# Patient Record
Sex: Male | Born: 1945 | Race: White | Hispanic: No | Marital: Married | Smoking: Never smoker
Health system: Southern US, Community
[De-identification: ages and names within clinical notes are randomized; demographics above are authoritative.]

## PROBLEM LIST (undated history)

## (undated) DIAGNOSIS — M1990 Unspecified osteoarthritis, unspecified site: Secondary | ICD-10-CM

## (undated) DIAGNOSIS — K219 Gastro-esophageal reflux disease without esophagitis: Secondary | ICD-10-CM

## (undated) DIAGNOSIS — I10 Essential (primary) hypertension: Secondary | ICD-10-CM

## (undated) DIAGNOSIS — F32A Depression, unspecified: Secondary | ICD-10-CM

## (undated) DIAGNOSIS — Z8042 Family history of malignant neoplasm of prostate: Secondary | ICD-10-CM

## (undated) DIAGNOSIS — G20 Parkinson's disease: Secondary | ICD-10-CM

## (undated) DIAGNOSIS — E785 Hyperlipidemia, unspecified: Secondary | ICD-10-CM

## (undated) DIAGNOSIS — Z808 Family history of malignant neoplasm of other organs or systems: Secondary | ICD-10-CM

## (undated) DIAGNOSIS — G20A1 Parkinson's disease without dyskinesia, without mention of fluctuations: Secondary | ICD-10-CM

## (undated) DIAGNOSIS — C61 Malignant neoplasm of prostate: Secondary | ICD-10-CM

## (undated) HISTORY — DX: Unspecified osteoarthritis, unspecified site: M19.90

## (undated) HISTORY — DX: Family history of malignant neoplasm of other organs or systems: Z80.8

## (undated) HISTORY — DX: Gastro-esophageal reflux disease without esophagitis: K21.9

## (undated) HISTORY — DX: Hyperlipidemia, unspecified: E78.5

## (undated) HISTORY — DX: Parkinson's disease: G20

## (undated) HISTORY — DX: Family history of malignant neoplasm of prostate: Z80.42

## (undated) HISTORY — DX: Essential (primary) hypertension: I10

## (undated) HISTORY — DX: Parkinson's disease without dyskinesia, without mention of fluctuations: G20.A1

## (undated) HISTORY — DX: Depression, unspecified: F32.A

---

## 2017-07-01 DIAGNOSIS — M720 Palmar fascial fibromatosis [Dupuytren]: Secondary | ICD-10-CM | POA: Insufficient documentation

## 2021-09-14 ENCOUNTER — Ambulatory Visit (INDEPENDENT_AMBULATORY_CARE_PROVIDER_SITE_OTHER): Payer: Medicare Other | Admitting: Neurology

## 2021-09-14 ENCOUNTER — Encounter: Payer: Self-pay | Admitting: Neurology

## 2021-09-14 VITALS — BP 135/78 | HR 57 | Ht 68.0 in | Wt 172.8 lb

## 2021-09-14 DIAGNOSIS — G250 Essential tremor: Secondary | ICD-10-CM

## 2021-09-14 DIAGNOSIS — G20 Parkinson's disease: Secondary | ICD-10-CM

## 2021-09-14 DIAGNOSIS — Z820 Family history of epilepsy and other diseases of the nervous system: Secondary | ICD-10-CM

## 2021-09-14 NOTE — Patient Instructions (Addendum)
It was nice to meet you and Frederick Ponce today.  As discussed, for now, we will keep you on your current medications which is carbidopa-levodopa 25-100 mg strength 1-1/2 pills once daily as well as propranolol 40 mg once daily.  I will request additional records from Dr. Patton Salles.  As you had multiple tests done through her office. We will check your kidney and liver function. Please work on reducing your daily alcohol consumption by limiting yourself to 1 serving per day. As discussed, we will proceed with a so-called DaT scan: This is a specialized brain scan designed to help with diagnosis of tremor disorders. A radioactive marker gets injected and the uptake is measured in the brain and compared to normal controls and right side is compared to the left, a change in uptake can help with diagnosis of certain tremor disorders. A brain MRI on the other hand is a brain scan that helps look at the brain structure in more detail overall and look for age-related changes, blood vessel related changes and look for stroke and volume loss which we call atrophy.

## 2021-09-14 NOTE — Progress Notes (Addendum)
Subjective:    Patient ID: Randeep Biondolillo is a 75 y.o. male.  HPI    Star Age, MD, PhD [facility] [address], [address] [place], [place] [address]  Dear Dr. Patton Salles,    I saw your patient, Kadarrius Yanke, upon your kind request, in my Neurologic clinic today for initial consultation of his tremor disorder, concern for  and history of neuropathy.  The patient is accompanied by his spouse, [name], today.  As you know, Mr. Jeancharles is a 76 year old right-handed gentleman with an underlying medical history of Hyperlipidemia and overweight state, who reports a longstanding history of bilateral upper extremity tremors.  He reports a family history of tremors affecting his father, paternal grandmother, 2 sisters.  One paternal aunt had Parkinson's disease and was on levodopa therapy.  He is currently on propranolol 40 mg at 1 AM at night.  He also takes carbidopa-levodopa 25-100 mg strength 1-1/2 pills at 1 AM at night, he does not take it during the day.  He reports that it is helping, both medications have helped his tremor and his motor function.  He has not had any recent falls.  He does not take his medications during the day as he would forget them.  He has not been on a higher dose of levodopa therapy.  He has not had any significant side effects.  He has an appointment with a new primary care physician in the next couple of weeks. I reviewed your office note from 05/11/2019.  He was complaining of pain in his legs and feet at the time.  He was also complaining of back pain he reported a tremor in his right hand which was longstanding for over 10 years.  He also reported a family history of tremors at the time.  He was advised to proceed with additional work-up through your office including a lumbar spine MRI without contrast, ultrasound for ankle brachial indices, EMG and nerve conduction velocity testing of both lower extremities.  He was advised to proceed  with blood work including B6, CBC with differential, vitamin D, A1c, vitamin a and E, protein electrophoresis, CMP, methylmalonic acid and homocystine.  Test results are not available for my review today.  He had a follow-up appointment on 11/02/2020 and your office.  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  He had a subsequent appointment on 01/25/2021, at which time he reported that he discontinued gabapentin 300 mg 3 times a day.  He felt very fatigued in the mornings.  He was started on Sinemet 25-100 mg strength 1 pill 3 times daily at the time.  He had an appointment on 06/19/2021, at which time he was on Sinemet 1 pill 3 times a day, 25-100 mg strength.  He had apparently tried gabapentin 100 mg at bedtime but it was no longer on his active list of medications.  He was referred to physical therapy.  He was started on propranolol 40 mg once daily to help with his tremors. We will request test results from your office.  They moved from [place], [place], some 2 weeks ago.  He has relatives close to here.  They have 3 dogs in the household.  His sleep is disrupted.  He tries to hydrate well.  He drinks caffeine in the form of coffee, 1 cup in the morning.  He drinks alcohol daily in the form of 3 diluted glasses of wine on average.   He reports intermittent numbness in his  feet, does not indicate that he has been told to reduce his alcohol intake or a connection between his alcohol consumption and neuropathy symptoms.  He recalls that he could not tolerate gabapentin.  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  A small fiber neuropathy cannot be ruled out on the basis of the study. Blood work from 05/15/2019 showed methylmalonic acid 121, homocystine 12.3, glucose 110, BUN 16, creatinine 0.85, alk phos  68, AST 21, ALT 23, A1c 5.6, protein electrophoresis benign, CBC with differential and platelets benign, vitamin D 17.6, within range, vitamin B6 18.9, within range, vitamin D 64, within range, vitamin D 46.    MRI brain without contrast from 05/22/2019 showed: Impression: No acute intracranial process.  White matter changes are nonspecific, but most likely related to chronic small vessel ischemic changes given the patient's age.  Generalized cerebral volume loss.  Polyp/mucosal retention cysts within the bilateral maxillary sinuses.  Scattered mucosal thickening of the paranasal sinuses.  He had a lumbar spine MRI without contrast on 05/22/2019 and I reviewed the results: Impression: Patient motion artifact slightly limits evaluation.  Multilevel degenerative changes and mild epidural lipomatosis, worst at L4-L5, where there is mild right and mild to moderate left neuroforaminal narrowing, minimal spinal canal stenosis, lateral recess narrowing, suspected abutment of the bilateral exiting L4 nerve roots and possible abutment of the descending left L5 nerve root.  Please see above for additional details regarding the individual levels.  Bone marrow edema within the right aspect of the T12-L1 endplate/right aspect of the L1 vertebral body is likely on a degenerative basis/reactive, however, on noncontrasted CT of the lumbar spine may be beneficial to better exclude an underlying lesion within the L1.  Bone marrow is heterogeneous diffusely, nonspecific but likely relating to age-related changes, degenerative changes and/or underlying osteopenia.  Prominence of the right renal pelvis, possibly relating to peri-/parapelvic cysts but incompletely evaluated on this study.  Hydronephrosis is not entirely excluded in the appropriate clinical setting.  Hypointense ovoid focus in this region measuring 1.1 cm, possibly relating to her renal calculus.  Clinical correlation is recommended.  Dedicated imaging is recommended  for further evaluation as clinically indicated.  His Past Medical History Is Significant For: History reviewed. No pertinent past medical history.  His Past Surgical History Is Significant For: History reviewed. No pertinent surgical history.  His Family History Is Significant For: Family History  Problem Relation Age of Onset   Tremor Father     His Social History Is Significant For: Social History   Socioeconomic History   Marital status: Married    Spouse name: Not on file   Number of children: Not on file   Years of education: Not on file   Highest education level: Not on file  Occupational History   Not on file  Tobacco Use   Smoking status: Never   Smokeless tobacco: Never  Vaping Use   Vaping Use: Never used  Substance and Sexual Activity   Alcohol use: Yes    Alcohol/week: 3.0 standard drinks    Types: 3 Glasses of wine per week   Drug use: Never   Sexual activity: Not on file  Other Topics Concern   Not on file  Social History Narrative   Not on file   Social Determinants of Health   Financial  Resource Strain: Not on file  Food Insecurity: Not on file  Transportation Needs: Not on file  Physical Activity: Not on file  Stress: Not on file  Social Connections: Not on file    His Allergies Are:  No Known Allergies:   His Current Medications Are:  Outpatient Encounter Medications as of 09/14/2021  Medication Sig   atorvastatin (LIPITOR) 40 MG tablet Take 40 mg by mouth daily.   LEVODOPA PO Take by mouth.   propranolol (INDERAL) 40 MG tablet Take 40 mg by mouth daily.   No facility-administered encounter medications on file as of 09/14/2021.  :   Review of Systems:  Out of a complete 14 point review of systems, all are reviewed and negative with the exception of these symptoms as listed below:   Review of Systems  Neurological:        Pt is here for Tremors . Pt states his hands and arms shows the most tremors. Pt states at times his legs burn.  Pt states he has to sleep with  a pillow between his legs because if his legs touch they will get achey.   Objective:  Neurological Exam  Physical Exam Physical Examination:   Vitals:   09/14/21 0808  BP: 135/78  Pulse: (!) 57   General Examination: The patient is a very pleasant 75 y.o. male in no acute distress. He appears well-developed and well-nourished and well groomed.   HEENT: Normocephalic, atraumatic, pupils are equal, round and reactive to light, extraocular tracking is fairly well-preserved.  Face is symmetric with no significant facial masking or decrease in eye blink rate.  Neck is fairly supple, no significant nuchal rigidity noted.  Hearing is grossly intact.  He has no lip, neck or jaw tremor.  Airway examination reveals mild mouth dryness.  Tongue protrudes centrally and palate elevates symmetrically.    Chest: Clear to auscultation without wheezing, rhonchi or crackles noted.  Heart: S1+S2+0, regular and normal without murmurs, rubs or gallops noted.   Abdomen: Soft, non-tender and non-distended with normal bowel sounds appreciated on auscultation.  Extremities: There is no pitting edema in the distal lower extremities bilaterally.  Skin: Warm and dry without trophic changes noted.  Musculoskeletal: exam reveals no obvious joint deformities, tenderness or joint swelling or erythema.   Neurologically:  Mental status: The patient is awake, alert and oriented in all 4 spheres. His immediate and remote memory, attention, language skills and fund of knowledge are impaired.  He has trouble relating details of his history and chronological events.  His spouse provides some additional information. Thought process is linear. Mood is normal and affect is normal.  Cranial nerves II - XII are as described above under HEENT exam. In addition: shoulder shrug is normal with equal shoulder height noted. Motor exam: Thin bulk, global strength of 4+ out of 5, no focal weakness, no  focal atrophy, has bilateral contractures of the fifth digits and hands.  Tone without significant cogwheeling.  He has an intermittent resting tremor in the left more than right upper extremities.   On 09/14/2021: On Archimedes spiral drawing he has no significant trembling with the right hand, insecurity and intermittent trembling with the left hand, handwriting is legible, not particularly tremulous or significantly micrographic, on the smaller side.  He has a mild postural and action tremor in both upper extremities.  No drift.  Reflexes are 1+ in the upper extremities and trace in the knees, absent in the ankles, Romberg is not tested for  safety concerns.   Fine motor skills and coordination: He has mild difficulty with finger taps and hand movements and foot taps bilaterally, left slightly worse than right.   Cerebellar testing: No dysmetria or intention tremor on finger to nose testing. Heel to shin is unremarkable bilaterally. There is no truncal or gait ataxia.  Sensory exam: intact to light touch, vibration and temperature sense in the upper and lower extremities with the exception of decreased temperature sense in the left and right distal lower extremities.   Gait, station and balance: He stands without difficulty, he has no walking aid, posture is age-appropriate.  He walks slightly slowly and cautiously, he has a slight decrease in arm swing on the left.  He has a more pronounced tremor in both upper extremities while walking.  Assessment and Plan:   In summary, Sebasthian Stailey is a very pleasant 75 y.o.-year old male with an underlying medical history of hyperlipidemia and overweight state, who presents for evaluation of his tremor disorder.  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  He also has a strong family history of tremors in general.  He also had work-up in the past for neuropathy, no etiology was found and findings currently are on the milder side.  He takes his propranolol and levodopa in the middle of the night which is unusual, also unusual is the lower dose and once daily dosing of immediate release levodopa.  Nevertheless, he has benefited from treatment with both medications and I did not suggest any significant change today.  We talked about his risk for neuropathy including daily alcohol consumption.  He does not drink heavily but has been drinking on a regular/daily basis.  He is discouraged from drinking alcohol daily and is advised to try to scale back especially if we were to escalate his levodopa dosing in the near future.  He has a normal heart rate with a pulse rate of 57 today.  I would not recommend increasing his beta-blocker at this time, he requested refills on his propranolol and levodopa today which I will provide.  I would like to proceed with a DaTscan for better diagnostic help.  I explained the scan to the patient.  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  The patient is agreeable to pursuing a DaTscan.  He is advised that this is not a definitive or diagnostic test per se.  He is advised to continue to pursue a healthy lifestyle, good nutrition, good hydration with water and physical activity.  We will plan a follow-up in about 3 to 4 months, sooner if needed.  We will keep him posted as to his DaTscan results by phone call in the interim.   This was an extended visit with copious record review, and higher complexity, with discussion of essential tremor versus parkinsonism.  Thank you very much for allowing me to participate in the care of this nice patient. If I can be of any further assistance to  you please do not hesitate to call me at [phone].  Sincerely,   Star Age, MD, PhD

## 2021-09-15 LAB — COMPREHENSIVE METABOLIC PANEL
ALT: 11 IU/L (ref 0–44)
AST: 19 IU/L (ref 0–40)
Albumin/Globulin Ratio: 2 (ref 1.2–2.2)
Albumin: 4.2 g/dL (ref 3.7–4.7)
Alkaline Phosphatase: 99 IU/L (ref 44–121)
BUN/Creatinine Ratio: 21 (ref 10–24)
BUN: 18 mg/dL (ref 8–27)
Bilirubin Total: 0.7 mg/dL (ref 0.0–1.2)
CO2: 23 mmol/L (ref 20–29)
Calcium: 9.1 mg/dL (ref 8.6–10.2)
Chloride: 104 mmol/L (ref 96–106)
Creatinine, Ser: 0.86 mg/dL (ref 0.76–1.27)
Globulin, Total: 2.1 g/dL (ref 1.5–4.5)
Glucose: 100 mg/dL — ABNORMAL HIGH (ref 65–99)
Potassium: 4.7 mmol/L (ref 3.5–5.2)
Sodium: 140 mmol/L (ref 134–144)
Total Protein: 6.3 g/dL (ref 6.0–8.5)
eGFR: 90 mL/min/{1.73_m2} (ref >59)

## 2021-09-18 ENCOUNTER — Telehealth: Payer: Self-pay | Admitting: Neurology

## 2021-09-18 MED ORDER — PROPRANOLOL HCL 40 MG PO TABS: 40.0000 mg | ORAL_TABLET | Freq: Every day | ORAL | 3 refills | Status: DC

## 2021-09-18 MED ORDER — CARBIDOPA-LEVODOPA 25-100 MG PO TABS: 1.5000 | ORAL_TABLET | Freq: Every day | ORAL | 3 refills | Status: DC

## 2021-09-18 NOTE — Telephone Encounter (Signed)
Pt states since Friday he has been still waiting for his medications:propranolol (INDERAL) 40 MG tablet, atorvastatin (LIPITOR) 40 MG tablet, LEVODOPA PO to be called into [facility] [phone] , please call pt re: refill status and the results to lab work.

## 2021-09-18 NOTE — Addendum Note (Signed)
Addended by: Brandon Melnick on: 09/18/2021 02:19 PM   Modules accepted: Orders

## 2021-09-18 NOTE — Telephone Encounter (Signed)
I called pt LMVM for him that sinemet and propranolol was escribed to [facility] and atorvastatin I dont see that she stated in her note that she would prescribe, that may be for your pcp, would be glad to ask she is out of office today.  Lab results unremarkable.  He is to call back as needed.

## 2021-09-19 NOTE — Telephone Encounter (Signed)
Called pt and LVM asking for call back. Also sent mychart message.   When the patient calls back, please let him know the prescriptions were sent to [facility] for Propranolol and Sinemet however per Dr Rexene Alberts, the Atorvastatin is to be prescribed by his primary care doctor.

## 2021-09-19 NOTE — Telephone Encounter (Signed)
I prescribed the propranolol and levodopa at the time of his appointment.  His Lipitor is to be prescribed by his primary care physician, I clarified this during the appointment with the patient and his partner.

## 2021-09-21 ENCOUNTER — Telehealth: Payer: Self-pay | Admitting: Neurology

## 2021-09-21 NOTE — Telephone Encounter (Signed)
Order placed for DaTscan. Referral form filled out & given to MD to sign.

## 2021-10-04 NOTE — Telephone Encounter (Signed)
No Josem Kaufmann is required for DaTscan per insurance. Orders sent to [facility] for scheduling.

## 2021-10-10 ENCOUNTER — Telehealth: Payer: Self-pay | Admitting: Neurology

## 2021-10-10 NOTE — Telephone Encounter (Signed)
Pt called states his neuropathy has become more intense. Describes a burning sensation which has increased. Pt requesting a call back regarding what he should do.

## 2021-10-10 NOTE — Telephone Encounter (Signed)
Pt returned call. He was seen for PD and received recommendations concerning this, DATSCAN to be done, pt needs to schedule, will give him [phone] speak to DAVID at [facility] for this (I will relay has he stated he had not heard anything yet).  He called for burning pain (like freezing pain) in bilateral legs when he lays down , usually an hour later will wake him up, he takes motrin 400mg  prn which calms things for him.  Was not sure if ok to take with other meds he is on.   Looking at last note you did not really address this.  He is not sure why this started now.  I explained that you address only one issue, and that was tremors but would be glad to ask.  He appreciated call.

## 2021-10-10 NOTE — Telephone Encounter (Signed)
I would recommend that he make a follow-up appointment with his primary care physician first to discuss pain in the legs. It could be coming from the back.  He may benefit from seeing a spine specialist as well.  I recommend that he see his primary care physician first to discuss a potential referral to orthopedics.

## 2021-10-10 NOTE — Telephone Encounter (Signed)
I called and LMVM for pt, Dr. Guadelupe Sabin message concerning pain in legs.  Recommended seeing pcp for eval and referral to orhtopedics.  He is to call back if questions.

## 2021-10-10 NOTE — Telephone Encounter (Signed)
LMVM for pt that returned call.  °

## 2021-10-11 ENCOUNTER — Telehealth: Payer: Self-pay | Admitting: Neurology

## 2021-10-11 NOTE — Telephone Encounter (Signed)
I called pt and relayed by VM that im sorry I did not relay to him about what I found out about DATSCAN.  I called [phone] spoke to Lalla Brothers not available.  Doing Datscan now at [facility], I relayed that per shannon did not have his information.  Will resend again and they should call him. No auth needed per Casimer Bilis in our office, noted 10-04-21. Pt to call back if questions.  I did leave # for pt to call later.

## 2021-10-11 NOTE — Telephone Encounter (Signed)
Pt is asking for a call to discuss the testing for tremors, please call.

## 2021-10-24 ENCOUNTER — Encounter (HOSPITAL_COMMUNITY)
Admission: RE | Admit: 2021-10-24 | Discharge: 2021-10-24 | Disposition: A | Discharge status: Home / Self Care | Payer: Medicare Other | Source: Ambulatory Visit | Attending: Neurology | Admitting: Neurology

## 2021-10-24 ENCOUNTER — Other Ambulatory Visit: Payer: Self-pay

## 2021-10-24 DIAGNOSIS — Z820 Family history of epilepsy and other diseases of the nervous system: Secondary | ICD-10-CM | POA: Insufficient documentation

## 2021-10-24 DIAGNOSIS — G250 Essential tremor: Secondary | ICD-10-CM | POA: Diagnosis not present

## 2021-10-24 DIAGNOSIS — G20 Parkinson's disease: Secondary | ICD-10-CM | POA: Insufficient documentation

## 2021-10-24 MED ORDER — IOFLUPANE I 123 185 MBQ/2.5ML IV SOLN
4.4000 | Freq: Once | INTRAVENOUS | Status: AC | PRN
  Administered 2021-10-24: 4.4 via INTRAVENOUS
  Filled 2021-10-24: qty 5

## 2021-10-24 MED ORDER — POTASSIUM IODIDE (ANTIDOTE) 130 MG PO TABS: 130.0000 mg | ORAL_TABLET | Freq: Once | ORAL | Status: DC

## 2021-10-24 MED ORDER — POTASSIUM IODIDE (ANTIDOTE) 130 MG PO TABS
ORAL_TABLET | ORAL | Status: AC
  Filled 2021-10-24: qty 1

## 2021-10-26 ENCOUNTER — Telehealth: Payer: Self-pay | Admitting: *Deleted

## 2021-10-26 NOTE — Telephone Encounter (Signed)
-----   Message from Star Age, MD sent at 10/25/2021 11:09 AM EDT ----- These call patient regarding the recent nuclear medicine DaT scan result. As discussed, this is a specialized brain scan designed to help with diagnosis of tremor disorders, including parkinsonian disorders. A radioactive marker gets injected and the uptake is measured in the brain and compared to normal controls and right side is compared to the left. A change in uptake can help with diagnosis of certain tremor disorders and narrow down the diagnostic possibilities. The patient's recent scan indicated abnormal (as in lower) uptake as compared to normal uptake pattern, indicating an underlying parkinsonian disorder.  Again, this is not a definitive test for Parkinson's disease and does not distinguish between Parkinson's disease and other, atypical parkinsonism disorders. We will continue current medications and he can follow-up as scheduled.

## 2021-10-26 NOTE — Telephone Encounter (Signed)
LMVM for pt to return call for [facility] results.

## 2021-10-26 NOTE — Telephone Encounter (Signed)
The patient returned my call.  We discussed the results of the DaTscan.  He verbalized understanding of the results and also that per Dr. Rexene Alberts we will continue current medications and he can follow-up as scheduled next month on 12/14/2021.  The patient verbalized appreciation for the call and he also asked if the results could be sent over to his partner's email: [email or web address]. If not, he would like it mailed to him.

## 2021-11-27 ENCOUNTER — Telehealth: Payer: Self-pay | Admitting: Neurology

## 2021-11-27 NOTE — Telephone Encounter (Signed)
Pt called states his tremors have increased, requesting a call back.

## 2021-11-27 NOTE — Telephone Encounter (Signed)
Patient returned our call (he has been rescheduled). He stated that 3 weeks ago he saw PCP for his sciatica. He states he was placed on Prednisone and Meloxicam (had to stop due to contraindication with Prednisone) and is now on Tylenol as well instead of Meloxicam. He states in the last week he has noticed an increase in frequency and severity of his tremors. He wanted this to be relayed to provider as an FYI but also if there is anything he should know or any recommendations please call back. I did relay to him that an increase of stress on the body could temporarily worsen the tremors. He stated he is still taking his Sinemet and Propranolol. Tomorrow is the last day of his Prednisone.   I do not see any drug-drug flags on Micromedex regarding his Prednisone, Sinemet, Propranolol.

## 2021-11-27 NOTE — Telephone Encounter (Signed)
I called pt and LMVM for him to return call with more symptoms.  He has appt 12-14-21 which looks like it is flagged to change (change in Dr. Guadelupe Sabin schedule).  If he calls back make an appointment for him.  Then I will speak to him about increased tremors.

## 2021-11-29 NOTE — Telephone Encounter (Signed)
Noted, thank you.  No additional recommendations from my end of things at this time.

## 2021-12-13 ENCOUNTER — Other Ambulatory Visit: Payer: Self-pay | Admitting: Adult Medicine

## 2021-12-13 DIAGNOSIS — M5136 Other intervertebral disc degeneration, lumbar region: Secondary | ICD-10-CM

## 2021-12-14 ENCOUNTER — Ambulatory Visit: Payer: Medicare Other | Admitting: Neurology

## 2021-12-22 ENCOUNTER — Telehealth: Payer: Self-pay | Admitting: Oncology

## 2021-12-22 NOTE — Telephone Encounter (Signed)
[facility] Scheduling Phone Note  I contacted Frederick Ponce regarding a referral from Dr. Carney Ponce for purpose of evaluation and work up of "iliac bone pain."  Frederick Ponce was aware of his referral and reason.  Information on reason for referral was not necessary.  Patient was informed about the [facility] and the intent being to complete a diagnostic/prognostic work-up for his suspicious findings.  He is aware his appointment is with our Physician Assistant to identify a diagnostic plan of care and to arrange further oncologist or specialist care as indicated.  I confirmed with Frederick Ponce he  has transportation to his [facility] appointment.  Patient is aware to bring a list of medications, as well as insurance cards.  Visitor policy and COVID protocol reviewed with patient as well, including what to expect on arrival to the [facility] regarding check-in process, visit, and potential for labs afterwards.  We look forward to meeting Frederick Ponce and completing his diagnostic work-up and arranging his subsequent appointment with our oncologist team.  Diagnostic Clinic Specific Info:  Best contact/way to contact patient:  Primary contact identified in EHR CHL updated to reflect?:  not applicable Is there a HC POA?:  No  Location of Diagnostic Clinic Appointment and Contact Info Provided to Patient: [place] [facility] [address] [place], [place] [address] [phone]   Reason for Referral, Clinical Information:  [facility], Dr. Carney Ponce, patient for evaluation of "constant pain" - 12/20/2021 office note sent mentioning sciatic type pain.  Referred to neurology and an MRI Lumbar Spine w/o Contrast completed on 12/07/2021: - Marked T1 hypointensity and STIR hyperintensity involving the entirety of the partially imaged left iliac bone as well as portions of the S2 and S3  vertebral bodies.  Although acute/recent fracture is a consideration, the appearance raises concern for possible underlying malignancy (most likely metastatic disease or multiple myeloma in this age group).

## 2021-12-27 ENCOUNTER — Inpatient Hospital Stay: Payer: Medicare Other | Attending: Physician Assistant | Admitting: Physician Assistant

## 2021-12-27 ENCOUNTER — Encounter: Payer: Self-pay | Admitting: Physician Assistant

## 2021-12-27 ENCOUNTER — Other Ambulatory Visit: Payer: Self-pay

## 2021-12-27 ENCOUNTER — Inpatient Hospital Stay: Payer: Medicare Other

## 2021-12-27 VITALS — BP 156/77 | HR 94 | Temp 97.7°F | Resp 18 | Wt 168.9 lb

## 2021-12-27 DIAGNOSIS — I700 Atherosclerosis of aorta: Secondary | ICD-10-CM | POA: Diagnosis not present

## 2021-12-27 DIAGNOSIS — C7951 Secondary malignant neoplasm of bone: Secondary | ICD-10-CM | POA: Diagnosis not present

## 2021-12-27 DIAGNOSIS — C61 Malignant neoplasm of prostate: Secondary | ICD-10-CM | POA: Insufficient documentation

## 2021-12-27 DIAGNOSIS — Z820 Family history of epilepsy and other diseases of the nervous system: Secondary | ICD-10-CM | POA: Insufficient documentation

## 2021-12-27 DIAGNOSIS — G20 Parkinson's disease: Secondary | ICD-10-CM | POA: Diagnosis not present

## 2021-12-27 DIAGNOSIS — R300 Dysuria: Secondary | ICD-10-CM | POA: Diagnosis not present

## 2021-12-27 DIAGNOSIS — M898X8 Other specified disorders of bone, other site: Secondary | ICD-10-CM

## 2021-12-27 DIAGNOSIS — M898X9 Other specified disorders of bone, unspecified site: Secondary | ICD-10-CM | POA: Diagnosis not present

## 2021-12-27 DIAGNOSIS — E785 Hyperlipidemia, unspecified: Secondary | ICD-10-CM | POA: Diagnosis not present

## 2021-12-27 DIAGNOSIS — Z79899 Other long term (current) drug therapy: Secondary | ICD-10-CM | POA: Diagnosis not present

## 2021-12-27 DIAGNOSIS — Z808 Family history of malignant neoplasm of other organs or systems: Secondary | ICD-10-CM | POA: Diagnosis not present

## 2021-12-27 DIAGNOSIS — R590 Localized enlarged lymph nodes: Secondary | ICD-10-CM | POA: Insufficient documentation

## 2021-12-27 DIAGNOSIS — K5900 Constipation, unspecified: Secondary | ICD-10-CM | POA: Insufficient documentation

## 2021-12-27 DIAGNOSIS — N2889 Other specified disorders of kidney and ureter: Secondary | ICD-10-CM | POA: Insufficient documentation

## 2021-12-27 DIAGNOSIS — Z5111 Encounter for antineoplastic chemotherapy: Secondary | ICD-10-CM | POA: Insufficient documentation

## 2021-12-27 DIAGNOSIS — I2510 Atherosclerotic heart disease of native coronary artery without angina pectoris: Secondary | ICD-10-CM | POA: Diagnosis not present

## 2021-12-27 DIAGNOSIS — K4090 Unilateral inguinal hernia, without obstruction or gangrene, not specified as recurrent: Secondary | ICD-10-CM | POA: Diagnosis not present

## 2021-12-27 DIAGNOSIS — I517 Cardiomegaly: Secondary | ICD-10-CM | POA: Diagnosis not present

## 2021-12-27 DIAGNOSIS — N200 Calculus of kidney: Secondary | ICD-10-CM | POA: Insufficient documentation

## 2021-12-27 DIAGNOSIS — K769 Liver disease, unspecified: Secondary | ICD-10-CM | POA: Insufficient documentation

## 2021-12-27 DIAGNOSIS — N400 Enlarged prostate without lower urinary tract symptoms: Secondary | ICD-10-CM | POA: Diagnosis not present

## 2021-12-27 DIAGNOSIS — C7800 Secondary malignant neoplasm of unspecified lung: Secondary | ICD-10-CM | POA: Insufficient documentation

## 2021-12-27 LAB — CBC WITH DIFFERENTIAL (CANCER CENTER ONLY)
Abs Immature Granulocytes: 0.03 10*3/uL (ref 0.00–0.07)
Basophils Absolute: 0 10*3/uL (ref 0.0–0.1)
Basophils Relative: 0 %
Eosinophils Absolute: 0.1 10*3/uL (ref 0.0–0.5)
Eosinophils Relative: 1 %
HCT: 40.6 % (ref 39.0–52.0)
Hemoglobin: 13.4 g/dL (ref 13.0–17.0)
Immature Granulocytes: 0 %
Lymphocytes Relative: 19 %
Lymphs Abs: 1.3 10*3/uL (ref 0.7–4.0)
MCH: 32.8 pg (ref 26.0–34.0)
MCHC: 33 g/dL (ref 30.0–36.0)
MCV: 99.3 fL (ref 80.0–100.0)
Monocytes Absolute: 0.6 10*3/uL (ref 0.1–1.0)
Monocytes Relative: 9 %
Neutro Abs: 4.9 10*3/uL (ref 1.7–7.7)
Neutrophils Relative %: 71 %
Platelet Count: 217 10*3/uL (ref 150–400)
RBC: 4.09 MIL/uL — ABNORMAL LOW (ref 4.22–5.81)
RDW: 12.9 % (ref 11.5–15.5)
WBC Count: 7 10*3/uL (ref 4.0–10.5)
nRBC: 0 % (ref 0.0–0.2)

## 2021-12-27 LAB — CMP (CANCER CENTER ONLY)
ALT: 22 U/L (ref 0–44)
AST: 24 U/L (ref 15–41)
Albumin: 4.3 g/dL (ref 3.5–5.0)
Alkaline Phosphatase: 198 U/L — ABNORMAL HIGH (ref 38–126)
Anion gap: 6 (ref 5–15)
BUN: 25 mg/dL — ABNORMAL HIGH (ref 8–23)
CO2: 24 mmol/L (ref 22–32)
Calcium: 9.4 mg/dL (ref 8.9–10.3)
Chloride: 105 mmol/L (ref 98–111)
Creatinine: 0.86 mg/dL (ref 0.61–1.24)
GFR, Estimated: >60 mL/min (ref >60)
Glucose, Bld: 102 mg/dL — ABNORMAL HIGH (ref 70–99)
Potassium: 4.1 mmol/L (ref 3.5–5.1)
Sodium: 135 mmol/L (ref 135–145)
Total Bilirubin: 1 mg/dL (ref 0.3–1.2)
Total Protein: 7.1 g/dL (ref 6.5–8.1)

## 2021-12-27 LAB — URINALYSIS, COMPLETE (UACMP) WITH MICROSCOPIC
Bacteria, UA: NONE SEEN
Bilirubin Urine: NEGATIVE
Glucose, UA: NEGATIVE mg/dL
Hgb urine dipstick: NEGATIVE
Ketones, ur: NEGATIVE mg/dL
Leukocytes,Ua: NEGATIVE
Nitrite: NEGATIVE
Protein, ur: NEGATIVE mg/dL
Specific Gravity, Urine: 1.01 (ref 1.005–1.030)
pH: 6 (ref 5.0–8.0)

## 2021-12-27 LAB — LACTATE DEHYDROGENASE: LDH: 158 U/L (ref 98–192)

## 2021-12-27 MED ORDER — PREGABALIN 25 MG PO CAPS: 25.0000 mg | ORAL_CAPSULE | Freq: Every day | ORAL | 0 refills | Status: DC

## 2021-12-27 NOTE — Progress Notes (Signed)
[facility] [phone]   Fax:(336) [phone]  INITIAL CONSULTATION:  Patient Care Team: Default, Provider, MD as PCP - General  CHIEF COMPLAINTS/PURPOSE OF CONSULTATION:  Left iliac bone pain  HISTORY OF PRESENTING ILLNESS:  Frederick Ponce 76 y.o. male with medical history significant for hyperlipidemia and parkinson's disease. Patient is accompanied by his partner for this visit.   On review of the previous records, Frederick Ponce initially presented with left iliac bone pain.  He underwent MRI scan of the lumbar spine on 12/07/2021.  Findings revealed marked T1 hypointensity involving the entirety of the partially imaged left iliac bone as well as portions of the S2 and S3 vertebral bodies.  On exam today, Frederick Ponce reports persistent low back pain that is predominantly in the left iliac region that started towards the end of September 2022. The pain causes burning pain down the left leg. He has tried various interventions including going to a chiropractor, oral steroids, steroid injections and most recently pain medication. He has tried various pain medications including meloxicam, hydrocodone-acetaminophen with minimal or short lasting improvement. His current pain regimen is Ibuprofen 600 mg twice daily alternative with Tylenol. Additionally, he takes Tramadol at night for breakthrough pain since it causes drowsiness. He denies numbness or tingling in the left lower extremity and denies any loss of bowel/bladder control. He is able to ambulate on his own. Patient reports that his energy levels have decreased some and he is limited to do some of this daily ADLs due to the pain. His appetite is stable and he denies any recent weight changes. He denies nausea, vomiting, abdominal pain, diarrhea or constipation. He denies easy bruising or signs of active bleeding. He reports intermittent episodes of night sweats that has been present for the past  one year. He has tremors due to parkinson disease. He reports new urinary symptoms that has been intermittently presents for the past 4 weeks including increased frequency, mild burning with urination and foul smelling odor. He denies fevers, chills, shortness of breath, chest pain or cough. He has no other complaints. Rest of the 10 point ROS is below.   MEDICAL HISTORY:  Past Medical History:  Diagnosis Date   Hyperlipidemia    Parkinson disease ([place])     SURGICAL HISTORY: History reviewed. No pertinent surgical history.  SOCIAL HISTORY: Social History   Socioeconomic History   Marital status: Married    Spouse name: Not on file   Number of children: Not on file   Years of education: Not on file   Highest education level: Not on file  Occupational History   Not on file  Tobacco Use   Smoking status: Never   Smokeless tobacco: Never  Vaping Use   Vaping Use: Never used  Substance and Sexual Activity   Alcohol use: Yes    Alcohol/week: 21.0 standard drinks    Types: 21 Glasses of wine per week   Drug use: Never   Sexual activity: Not on file  Other Topics Concern   Not on file  Social History Narrative   Not on file   Social Determinants of Health   Financial Resource Strain: Not on file  Food Insecurity: Not on file  Transportation Needs: Not on file  Physical Activity: Not on file  Stress: Not on file  Social Connections: Not on file  Intimate Partner Violence: Not on file    FAMILY HISTORY: Family History  Problem Relation Age of Onset  Tremor Father    Melanoma Sister     ALLERGIES:  has No Known Allergies.  MEDICATIONS:  Current Outpatient Medications  Medication Sig Dispense Refill   Apoaequorin (PREVAGEN PO) Take by mouth.     atorvastatin (LIPITOR) 40 MG tablet Take 40 mg by mouth daily.     carbidopa-levodopa (SINEMET) 25-100 MG tablet Take 1.5 tablets by mouth daily. 135 tablet 3   co-enzyme Q-10 30 MG capsule Take 30 mg by mouth 3 (three)  times daily.     ibuprofen (ADVIL) 600 MG tablet Take 600 mg by mouth every 6 (six) hours as needed.     KRILL OIL PO Take by mouth.     multivitamin-lutein (OCUVITE-LUTEIN) CAPS capsule Take 1 capsule by mouth daily.     pregabalin (LYRICA) 25 MG capsule Take 1 capsule (25 mg total) by mouth daily. 30 capsule 0   traMADol (ULTRAM) 50 MG tablet Take by mouth every 6 (six) hours as needed.     vitamin B-12 (CYANOCOBALAMIN) 1000 MCG tablet Take 1,000 mcg by mouth daily.     propranolol (INDERAL) 40 MG tablet Take 1 tablet (40 mg total) by mouth daily. (Patient not taking: Reported on 12/27/2021) 90 tablet 3   No current facility-administered medications for this visit.    REVIEW OF SYSTEMS:   Constitutional: ( - ) fevers, ( - )  chills , ( - ) night sweats Eyes: ( - ) blurriness of vision, ( - ) double vision, ( - ) watery eyes Ears, nose, mouth, throat, and face: ( - ) mucositis, ( - ) sore throat Respiratory: ( - ) cough, ( - ) dyspnea, ( - ) wheezes Cardiovascular: ( - ) palpitation, ( - ) chest discomfort, ( - ) lower extremity swelling Gastrointestinal:  ( - ) nausea, ( - ) heartburn, ( - ) change in bowel habits Skin: ( - ) abnormal skin rashes Lymphatics: ( - ) new lymphadenopathy, ( - ) easy bruising Neurological: ( - ) numbness, ( - ) tingling, ( - ) new weaknesses Behavioral/Psych: ( - ) mood change, ( - ) new changes  All other systems were reviewed with the patient and are negative.  PHYSICAL EXAMINATION: ECOG PERFORMANCE STATUS: 1 - Symptomatic but completely ambulatory  Vitals:   12/27/21 1321  BP: (!) 156/77  Pulse: 94  Resp: 18  Temp: 97.7 F (36.5 C)  SpO2: 95%   Filed Weights   12/27/21 1321  Weight: 168 lb 14.4 oz (76.6 kg)    GENERAL: well appearing male in NAD  SKIN: skin color, texture, turgor are normal, no rashes or significant lesions EYES: conjunctiva are pink and non-injected, sclera clear OROPHARYNX: no exudate, no erythema; lips, buccal mucosa,  and tongue normal  NECK: supple, non-tender LYMPH:  no palpable lymphadenopathy in the cervical or supraclavicular lymph nodes.  LUNGS: clear to auscultation and percussion with normal breathing effort HEART: regular rate & rhythm and no murmurs and no lower extremity edema ABDOMEN: soft, non-tender, non-distended, normal bowel sounds Musculoskeletal: no cyanosis of digits and no clubbing. Tenderness to midline palpation in lumbar vertebrae. PSYCH: alert & oriented x 3, fluent speech NEURO: no focal motor/sensory deficits  LABORATORY DATA:  I have reviewed the data as listed CBC Latest Ref Rng & Units 12/27/2021  WBC 4.0 - 10.5 K/uL 7.0  Hemoglobin 13.0 - 17.0 g/dL 13.4  Hematocrit 39.0 - 52.0 % 40.6  Platelets 150 - 400 K/uL 217    CMP Latest Ref Rng &  Units 12/27/2021 09/14/2021  Glucose 70 - 99 mg/dL 102(H) 100(H)  BUN 8 - 23 mg/dL 25(H) 18  Creatinine 0.61 - 1.24 mg/dL 0.86 0.86  Sodium 135 - 145 mmol/L 135 140  Potassium 3.5 - 5.1 mmol/L 4.1 4.7  Chloride 98 - 111 mmol/L 105 104  CO2 22 - 32 mmol/L 24 23  Calcium 8.9 - 10.3 mg/dL 9.4 9.1  Total Protein 6.5 - 8.1 g/dL 7.1 6.3  Total Bilirubin 0.3 - 1.2 mg/dL 1.0 0.7  Alkaline Phos 38 - 126 U/L 198(H) 99  AST 15 - 41 U/L 24 19  ALT 0 - 44 U/L 22 11     RADIOGRAPHIC STUDIES: I have personally reviewed the radiological images as listed and agreed with the findings in the report.  [facility] MRI Lumbar spine 12/07/2021: Marked T1 hypointensity involving the entirety of the partially imaged left iliac bone as well as portions of the S2 and S3 vertebral bodies.  ASSESSMENT & PLAN Frederick Ponce is a 76 y.o. male who presents for initial evaluation for abnormal MRI findings of the left iliac bone. I reviewed the outside MRI scan report that shows marked hypointensity in the left iliac bone as well as portions of the S2 and S3 vertebral bodies.   We reviewed possible etiologies including benign versus malignant process. We will  proceed with diagnostic workup to rule out any malignant process. This will include serologic workup today. If today's workup is negative, then we will recommend a bone biopsy of the left iliac bone.   #Left iliac bone abnormality: --Labs today to check CBC, CMP, LDH, SPEP/IFE, serum free light chains, PSA levels --If serologic workup is negative, then we will pursue a bone biopsy. --We will request outside MRI scan to be uploaded to Epic.  --RTC based on workup.   #Age appropriate screenings: --Patient denies any recent PSA checks so we will check today. --He denies undergoing colonoscopy for colon cancer screenings.   #Urinary symptoms: --Will check urinalysis today   #Low back pain with left leg radiculopathy: --Currently on ibuprofen 600 mg twice daily alternating with Tylenol.  --Takes tramadol 50 mg, 1-2 tablets, in the evening for breakthrough pain. Unable to take during the day due to drowsiness.  --Sent prescription of Lyrica 25 mg once daily. Patient is unable to tolerate gabapentin due to nightmares.    Orders Placed This Encounter  Procedures   CBC with Differential ([facility] Only)    Standing Status:   Future    Number of Occurrences:   1    Standing Expiration Date:   12/27/2022   CMP ([facility] only)    Standing Status:   Future    Number of Occurrences:   1    Standing Expiration Date:   12/27/2022   Lactate dehydrogenase (LDH)    Standing Status:   Future    Number of Occurrences:   1    Standing Expiration Date:   12/27/2022   Multiple Myeloma Panel (SPEP&IFE w/QIG)    Standing Status:   Future    Number of Occurrences:   1    Standing Expiration Date:   12/27/2022   Kappa/lambda light chains    Standing Status:   Future    Number of Occurrences:   1    Standing Expiration Date:   12/27/2022   Prostate-Specific AG, Serum    Standing Status:   Future    Number of Occurrences:   1    Standing Expiration Date:  12/27/2022   Urinalysis, Complete w  Microscopic    Standing Status:   Future    Number of Occurrences:   1    Standing Expiration Date:   12/27/2022    All questions were answered. The patient knows to call the clinic with any problems, questions or concerns.  I have spent a total of 60 minutes minutes of face-to-face and non-face-to-face time, preparing to see the patient, obtaining and/or reviewing separately obtained history, performing a medically appropriate examination, counseling and educating the patient, ordering medications/tests, documenting clinical information in the electronic health record, and care coordination.   Dede Query, PA-C Department of Hematology/Oncology [place] at [facility] Phone: [phone]

## 2021-12-28 ENCOUNTER — Other Ambulatory Visit: Payer: Self-pay | Admitting: *Deleted

## 2021-12-28 ENCOUNTER — Ambulatory Visit
Admission: RE | Admit: 2021-12-28 | Discharge: 2021-12-28 | Disposition: A | Discharge status: Home / Self Care | Payer: Self-pay | Source: Ambulatory Visit | Attending: Physician Assistant | Admitting: Physician Assistant

## 2021-12-28 DIAGNOSIS — M898X8 Other specified disorders of bone, other site: Secondary | ICD-10-CM

## 2021-12-28 LAB — PROSTATE-SPECIFIC AG, SERUM (LABCORP): Prostate Specific Ag, Serum: 22.5 ng/mL — ABNORMAL HIGH (ref 0.0–4.0)

## 2021-12-28 LAB — KAPPA/LAMBDA LIGHT CHAINS
Kappa free light chain: 16 mg/L (ref 3.3–19.4)
Kappa, lambda light chain ratio: 1.31 (ref 0.26–1.65)
Lambda free light chains: 12.2 mg/L (ref 5.7–26.3)

## 2022-01-01 ENCOUNTER — Other Ambulatory Visit: Payer: Self-pay | Admitting: Physician Assistant

## 2022-01-01 ENCOUNTER — Telehealth: Payer: Self-pay

## 2022-01-01 LAB — MULTIPLE MYELOMA PANEL, SERUM
Albumin SerPl Elph-Mcnc: 3.8 g/dL (ref 2.9–4.4)
Albumin/Glob SerPl: 1.5 (ref 0.7–1.7)
Alpha 1: 0.2 g/dL (ref 0.0–0.4)
Alpha2 Glob SerPl Elph-Mcnc: 0.8 g/dL (ref 0.4–1.0)
B-Globulin SerPl Elph-Mcnc: 0.8 g/dL (ref 0.7–1.3)
Gamma Glob SerPl Elph-Mcnc: 0.7 g/dL (ref 0.4–1.8)
Globulin, Total: 2.6 g/dL (ref 2.2–3.9)
IgA: 118 mg/dL (ref 61–437)
IgG (Immunoglobin G), Serum: 735 mg/dL (ref 603–1613)
IgM (Immunoglobulin M), Srm: 31 mg/dL (ref 15–143)
Total Protein ELP: 6.4 g/dL (ref 6.0–8.5)

## 2022-01-01 MED ORDER — OXYCODONE HCL ER 10 MG PO T12A: 10.0000 mg | EXTENDED_RELEASE_TABLET | Freq: Two times a day (BID) | ORAL | 0 refills | Status: DC

## 2022-01-01 MED ORDER — TRAMADOL HCL 50 MG PO TABS: 50.0000 mg | ORAL_TABLET | Freq: Four times a day (QID) | ORAL | 0 refills | Status: DC | PRN

## 2022-01-01 NOTE — Progress Notes (Signed)
Patient reports that he took Lyrica 25 mg once daily for 3-4 days. He discontinued yesterday due to side effects with headaches when he woke up. Additionally, he added that the pain did not improve with Lyrica.   Patient currently alternates Meloxicam and Tylenol during the day. He takes tramadol for breakthrough pain in the evening however the pain relief is only for a shortened period of time. Patient is asking for a different pain regimen.   Recommendation is long acting oxycontin 10 mg q 12 hours. Then take tramadol with meloxicam/tylenol for breakthrough pain. Sent refill of Tramdol 50 mg and new script for oxycontin 10 mg.   I am awaiting SPEP and IFE results. I will call patient back once all lab results are finalized. He expressed understanding of the plan provided.

## 2022-01-01 NOTE — Telephone Encounter (Signed)
T/C from Frederick Ponce stating he had to stop taking the Lyrica due to severe headaches.  He went back to taking meloxicam 15 mg PO daily and 1500 mg of tylenol every 4-6 hrs.  This use to control his pain but is no longer working and is asking for something else for pain.  He is also asking for 12/27/21 lab results  Please advise

## 2022-01-02 ENCOUNTER — Other Ambulatory Visit: Payer: Self-pay | Admitting: Physician Assistant

## 2022-01-02 ENCOUNTER — Telehealth: Payer: Self-pay | Admitting: Physician Assistant

## 2022-01-02 DIAGNOSIS — R9389 Abnormal findings on diagnostic imaging of other specified body structures: Secondary | ICD-10-CM

## 2022-01-02 DIAGNOSIS — M898X8 Other specified disorders of bone, other site: Secondary | ICD-10-CM

## 2022-01-02 NOTE — Telephone Encounter (Signed)
Left voicemail to patient that labs don't indicate plasma cell disorders including multiple myeloma. Recommend to proceed with biopsy of left iliac bone. Stat order will be placed to interventional radiology today. We will follow up with patient once biopsy results are available.  °

## 2022-01-04 ENCOUNTER — Telehealth: Payer: Self-pay

## 2022-01-04 ENCOUNTER — Other Ambulatory Visit: Payer: Self-pay

## 2022-01-04 ENCOUNTER — Encounter (HOSPITAL_COMMUNITY): Payer: Self-pay

## 2022-01-04 ENCOUNTER — Ambulatory Visit (HOSPITAL_COMMUNITY)
Admission: RE | Admit: 2022-01-04 | Discharge: 2022-01-04 | Disposition: A | Discharge status: Home / Self Care | Payer: Medicare Other | Source: Ambulatory Visit | Attending: Physician Assistant | Admitting: Physician Assistant

## 2022-01-04 DIAGNOSIS — R9389 Abnormal findings on diagnostic imaging of other specified body structures: Secondary | ICD-10-CM

## 2022-01-04 DIAGNOSIS — M898X8 Other specified disorders of bone, other site: Secondary | ICD-10-CM

## 2022-01-04 MED ORDER — SODIUM CHLORIDE (PF) 0.9 % IJ SOLN
INTRAMUSCULAR | Status: AC
  Filled 2022-01-04: qty 50

## 2022-01-04 NOTE — Telephone Encounter (Signed)
T/C from pt stating he had a really rough night last night.  Could not keep pain under control.  He took an oxycontin at 9:00, tramadol and tylenol at 12, tylenol at 4 am and oxycontin this morning at 8:30.  He is asking if he need to switch to ibuprofen and how much.  Please advise

## 2022-01-04 NOTE — Telephone Encounter (Signed)
Our office was asked to see Frederick Ponce to help with pain management. I called his phone but he wasn't able to answer. I left a voicemail explaining that we would like to see him in person tomorrow to evaluate him and see what we can do for his pain if he is available. I will follow up tomorrow.

## 2022-01-05 ENCOUNTER — Telehealth: Payer: Self-pay | Admitting: Physician Assistant

## 2022-01-05 ENCOUNTER — Encounter (HOSPITAL_COMMUNITY): Payer: Self-pay | Admitting: Radiology

## 2022-01-05 ENCOUNTER — Ambulatory Visit (HOSPITAL_COMMUNITY)
Admission: RE | Admit: 2022-01-05 | Discharge: 2022-01-05 | Disposition: A | Discharge status: Home / Self Care | Payer: Medicare Other | Source: Ambulatory Visit | Attending: Physician Assistant | Admitting: Physician Assistant

## 2022-01-05 ENCOUNTER — Encounter: Payer: Self-pay | Admitting: Nurse Practitioner

## 2022-01-05 ENCOUNTER — Inpatient Hospital Stay (HOSPITAL_BASED_OUTPATIENT_CLINIC_OR_DEPARTMENT_OTHER): Payer: Medicare Other | Admitting: Nurse Practitioner

## 2022-01-05 VITALS — BP 150/82 | HR 86 | Temp 97.4°F | Resp 20 | Ht 68.0 in | Wt 172.4 lb

## 2022-01-05 DIAGNOSIS — R9389 Abnormal findings on diagnostic imaging of other specified body structures: Secondary | ICD-10-CM | POA: Insufficient documentation

## 2022-01-05 DIAGNOSIS — M898X8 Other specified disorders of bone, other site: Secondary | ICD-10-CM

## 2022-01-05 DIAGNOSIS — Z515 Encounter for palliative care: Secondary | ICD-10-CM

## 2022-01-05 DIAGNOSIS — Z5111 Encounter for antineoplastic chemotherapy: Secondary | ICD-10-CM | POA: Diagnosis not present

## 2022-01-05 MED ORDER — SODIUM CHLORIDE (PF) 0.9 % IJ SOLN
INTRAMUSCULAR | Status: AC
  Filled 2022-01-05: qty 50

## 2022-01-05 MED ORDER — CELECOXIB 200 MG PO CAPS: 200.0000 mg | ORAL_CAPSULE | Freq: Every day | ORAL | 0 refills | Status: DC

## 2022-01-05 MED ORDER — LIDOCAINE 5 % EX PTCH: 1.0000 | MEDICATED_PATCH | CUTANEOUS | 0 refills | Status: DC

## 2022-01-05 MED ORDER — IOHEXOL 300 MG/ML  SOLN
100.0000 mL | Freq: Once | INTRAMUSCULAR | Status: AC | PRN
  Administered 2022-01-05: 100 mL via INTRAVENOUS

## 2022-01-05 NOTE — Progress Notes (Signed)
[facility]  [phone] Fax:(336) [phone]   Name: Frederick Ponce Date: 01/05/2022 MRN: [id number]  DOB: 29-Aug-1946  Patient Care Team: Default, Provider, MD as PCP - General    REASON FOR CONSULTATION: Willford Ponce is a 76 y.o. male with medical history including Parkinson's disease and hyperlipidemia.  Palliative ask to see for symptom management.    SOCIAL HISTORY:     reports that he has never smoked. He has never used smokeless tobacco. He reports current alcohol use of about 21.0 standard drinks per week. He reports that he does not use drugs.  ADVANCE DIRECTIVES:    CODE STATUS:   PAST MEDICAL HISTORY: Past Medical History:  Diagnosis Date   Hyperlipidemia    Parkinson disease ([place])     PAST SURGICAL HISTORY: History reviewed. No pertinent surgical history.  HEMATOLOGY/ONCOLOGY HISTORY:  Oncology History   No history exists.    ALLERGIES:  has No Known Allergies.  MEDICATIONS:  Current Outpatient Medications  Medication Sig Dispense Refill   Apoaequorin (PREVAGEN PO) Take by mouth.     atorvastatin (LIPITOR) 40 MG tablet Take 40 mg by mouth daily.     carbidopa-levodopa (SINEMET) 25-100 MG tablet Take 1.5 tablets by mouth daily. 135 tablet 3   co-enzyme Q-10 30 MG capsule Take 30 mg by mouth 3 (three) times daily.     ibuprofen (ADVIL) 600 MG tablet Take 600 mg by mouth every 6 (six) hours as needed.     KRILL OIL PO Take by mouth.     multivitamin-lutein (OCUVITE-LUTEIN) CAPS capsule Take 1 capsule by mouth daily.     oxyCODONE (OXYCONTIN) 10 mg 12 hr tablet Take 1 tablet (10 mg total) by mouth every 12 (twelve) hours. 60 tablet 0   propranolol (INDERAL) 40 MG tablet Take 1 tablet (40 mg total) by mouth daily. (Patient not taking: Reported on 12/27/2021) 90 tablet 3   traMADol (ULTRAM) 50 MG tablet Take 1 tablet (50 mg total) by mouth every 6 (six) hours as needed. 30 tablet 0   vitamin B-12  (CYANOCOBALAMIN) 1000 MCG tablet Take 1,000 mcg by mouth daily.     No current facility-administered medications for this visit.    VITAL SIGNS: BP (!) 150/82 (BP Location: Left Arm, Patient Position: Sitting)    Pulse 86    Temp (!) 97.4 F (36.3 C) (Oral)    Resp 20    Ht 5\' 8"  (1.727 m)    Wt 172 lb 6.4 oz (78.2 kg)    SpO2 96%    BMI 26.21 kg/m  Filed Weights   01/05/22 1206  Weight: 172 lb 6.4 oz (78.2 kg)    Estimated body mass index is 26.21 kg/m as calculated from the following:   Height as of this encounter: 5\' 8"  (1.727 m).   Weight as of this encounter: 172 lb 6.4 oz (78.2 kg).  LABS: CBC:    Component Value Date/Time   WBC 7.0 12/27/2021 1441   HGB 13.4 12/27/2021 1441   HCT 40.6 12/27/2021 1441   PLT 217 12/27/2021 1441   MCV 99.3 12/27/2021 1441   NEUTROABS 4.9 12/27/2021 1441   LYMPHSABS 1.3 12/27/2021 1441   MONOABS 0.6 12/27/2021 1441   EOSABS 0.1 12/27/2021 1441   BASOSABS 0.0 12/27/2021 1441   Comprehensive Metabolic Panel:    Component Value Date/Time   NA 135 12/27/2021 1441   NA 140 09/14/2021 0920   K 4.1 12/27/2021 1441  CL 105 12/27/2021 1441   CO2 24 12/27/2021 1441   BUN 25 (H) 12/27/2021 1441   BUN 18 09/14/2021 0920   CREATININE 0.86 12/27/2021 1441   GLUCOSE 102 (H) 12/27/2021 1441   CALCIUM 9.4 12/27/2021 1441   AST 24 12/27/2021 1441   ALT 22 12/27/2021 1441   ALKPHOS 198 (H) 12/27/2021 1441   BILITOT 1.0 12/27/2021 1441   PROT 7.1 12/27/2021 1441   PROT 6.3 09/14/2021 0920   ALBUMIN 4.3 12/27/2021 1441   ALBUMIN 4.2 09/14/2021 0920    RADIOGRAPHIC STUDIES: No results found.  PERFORMANCE STATUS (ECOG) : 1 - Symptomatic but completely ambulatory  Review of Systems  Gastrointestinal:  Positive for constipation.  Musculoskeletal:  Positive for arthralgias and back pain.  Unless otherwise noted, a complete review of systems is negative.  Physical Exam General: NAD Cardiovascular: regular rate and rhythm Pulmonary: clear  ant fields Abdomen: soft, nontender, + bowel sounds Extremities: no edema, no joint deformities Skin: no rashes, scattered bruising  Neurological: AAO x3,   IMPRESSION:  This is our initial visit with Frederick Ponce with request to assist with symptom management. He is having uncontrolled left iliac bone pain. He presents to the clinic with support from his spouse, Frederick Ponce.   I introduced myself, Advertising copywriter, and Palliative's role in collaboration with the oncology team. Concept of Palliative Care was introduced as specialized medical care for people and their families living with serious illness.  It focuses on providing relief from the symptoms and stress of a serious illness.  The goal is to improve quality of life for both the patient and the family. Values and goals of care important to patient and family were attempted to be elicited.    Frederick Ponce and his husband to [place] back in September 2022 from [place] with their 3 dogs.  He has 2 sisters that live locally.  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  He is able to perform all ADLs independently.  Appetite is good.  Complains of constant left iliac pain that interferes with activity and sleep.  Left Iliac bone pain  A[place]on reports ongoing controlled left iliac body pain.  He has tried multiple interventions including chiropractic, steroid injections, and medications.  He is currently taking ibuprofen, Tylenol extra strength, tramadol, and OxyContin.  Reports he was unaware that he should be taking OxyContin twice a day and as of yesterday has began taking it as prescribed.  We discussed additional support as his pain remains uncontrolled despite all efforts.  Will try Lidoderm patches to the area in addition to Celebrex daily.  He has been advised to discontinue ibuprofen but may continue with his Tylenol as needed.  Constipation States he has encountered recent constipation.  Education provided  good bowel regimen in the setting of pain medication.  Jenkins states prune juice has been effective thus far. Also recommended daily MiraLAX.  Frederick Ponce and his spouse are hopeful for answers regarding his ongoing pain and possible malignancy.   I discussed the importance of continued conversation with family and their medical providers regarding overall plan of care and treatment options, ensuring decisions are within the context of the patients values and GOCs.  PLAN: Oxycontin 10 mg every 12hrs as previously prescribed.  Tylenol extra strength 3 times a day as needed Celebrex 200 mg daily Education provided on discontinuing use of ibuprofen/Advil. Lidoderm patch daily MiraLAX daily.  May continue prune juice for  bowel regimen. I will plan to follow-up with patient by phone on next week to evaluate pain.  We will see patient back in 2-4 weeks in collaboration to other oncology appointments.   Patient expressed understanding and was in agreement with this plan. He also understands that He can call the clinic at any time with any questions, concerns, or complaints.   Time Total: 45 min.   Visit consisted of counseling and education dealing with the complex and emotionally intense issues of symptom management and palliative care in the setting of serious and potentially life-threatening illness.Greater than 50%  of this time was spent counseling and coordinating care related to the above assessment and plan.  Signed by: Alda Lea, AGPCNP-BC Palliative Medicine Team

## 2022-01-05 NOTE — Progress Notes (Signed)
Patient Name  Rustin, Erhart Legal Sex  Male DOB  1946/11/17 SSN  [id number] Address  [address]  [place] [address] Phone  [phone] (Home)  [phone] (Mobile) *Preferred*    FW: CT Biopsy Received: Today Corrie Mckusick, DO  Garth Bigness D; P Ir Procedure Requests CT CAP complete.   OK for CT guided biopsy of the left iliac bone lesion, for attempt at confirmation of prostate cancer.   Discussed with referring.   Earleen Newport        Previous Messages   ----- Message -----  From: Corrie Mckusick, DO  Sent: 01/02/2022  12:26 PM EST  To: Jillyn Hidden, Ir Procedure Requests  Subject: RE: CT Biopsy                                   Deferring now.   Planning for CT CAP to look for soft tissue site of disease.  Myeloma is not the leading DDX.   Please have them resubmit once CT is done, and can be put on the general list for review.   Discussed with referring.  Earleen Newport  ----- Message -----  From: Garth Bigness D  Sent: 01/02/2022   9:40 AM EST  To: Ir Procedure Requests  Subject: CT Biopsy                                       Procedure:  CT Biopsy   Reason:  Iliac bone pain, biopsy of left iliac bone. abnormal MRI finding with pain   History:  NM in computer, outside MR in Pac's   Provider:  Dede Query T   Provider Contact:  [phone]

## 2022-01-05 NOTE — Telephone Encounter (Signed)
I have tried to reach [name] and his spouse by phone several times today. Unable to reach them to review the CT scan results and need to biopsy. I have left a voicemail to call me back.

## 2022-01-08 ENCOUNTER — Telehealth: Payer: Self-pay | Admitting: Physician Assistant

## 2022-01-08 ENCOUNTER — Telehealth: Payer: Self-pay | Admitting: Nurse Practitioner

## 2022-01-08 NOTE — Telephone Encounter (Signed)
Scheduled appointment per 01/13 los. Patient stated he is going to stay on the medication, and did not see the need of the appointment, I cancel appointment accordingly.

## 2022-01-08 NOTE — Telephone Encounter (Signed)
I called and spoke to Frederick Ponce today to review the CT scan results from 01/05/2022. Unfortunately, findings are concerning for metastatic disease with bilateral lung nodules, right hilar adenopathy, extensive pelvic osseous lesions. There is prostatomegaly with ill definition of prostatic capsule. We will request a STAT biopsy of a target lesion to confirm diagnosis and primary malignancy. Once pathology is confirmed, we will arrange a follow up with medical oncologist.   Patient expressed understanding of the plan provided.

## 2022-01-11 ENCOUNTER — Other Ambulatory Visit: Payer: Self-pay | Admitting: *Deleted

## 2022-01-11 ENCOUNTER — Other Ambulatory Visit: Payer: Self-pay | Admitting: Physician Assistant

## 2022-01-11 ENCOUNTER — Other Ambulatory Visit: Payer: Self-pay | Admitting: Physical Medicine and Rehabilitation

## 2022-01-11 DIAGNOSIS — R9389 Abnormal findings on diagnostic imaging of other specified body structures: Secondary | ICD-10-CM

## 2022-01-11 DIAGNOSIS — M898X8 Other specified disorders of bone, other site: Secondary | ICD-10-CM

## 2022-01-11 NOTE — Progress Notes (Signed)
Able to get CT Biospy preformed sooner at [facility].  New order placed to be scheduled.  Procedure to be done at [facility] will be canceled.

## 2022-01-15 ENCOUNTER — Ambulatory Visit
Admission: RE | Admit: 2022-01-15 | Discharge: 2022-01-15 | Disposition: A | Discharge status: Home / Self Care | Payer: Medicare Other | Source: Ambulatory Visit | Attending: Physician Assistant | Admitting: Physician Assistant

## 2022-01-15 ENCOUNTER — Other Ambulatory Visit (HOSPITAL_COMMUNITY)
Admission: RE | Admit: 2022-01-15 | Discharge: 2022-01-15 | Disposition: A | Discharge status: Home / Self Care | Payer: Medicare Other | Source: Ambulatory Visit | Attending: Physician Assistant | Admitting: Physician Assistant

## 2022-01-15 ENCOUNTER — Other Ambulatory Visit: Payer: Self-pay

## 2022-01-15 ENCOUNTER — Other Ambulatory Visit: Payer: Self-pay | Admitting: Physician Assistant

## 2022-01-15 DIAGNOSIS — M898X8 Other specified disorders of bone, other site: Secondary | ICD-10-CM

## 2022-01-15 DIAGNOSIS — R9389 Abnormal findings on diagnostic imaging of other specified body structures: Secondary | ICD-10-CM

## 2022-01-15 MED ORDER — FENTANYL CITRATE PF 50 MCG/ML IJ SOSY
25.0000 ug | PREFILLED_SYRINGE | INTRAMUSCULAR | Status: DC | PRN
  Administered 2022-01-15: 25 ug via INTRAVENOUS
  Administered 2022-01-15: 50 ug via INTRAVENOUS
  Administered 2022-01-15: 25 ug via INTRAVENOUS
  Administered 2022-01-15: 50 ug via INTRAVENOUS
  Administered 2022-01-15: 25 ug via INTRAVENOUS

## 2022-01-15 MED ORDER — MIDAZOLAM HCL 2 MG/2ML IJ SOLN
1.0000 mg | INTRAMUSCULAR | Status: DC | PRN
  Administered 2022-01-15: 0.5 mg via INTRAVENOUS
  Administered 2022-01-15 (×2): 1 mg via INTRAVENOUS

## 2022-01-15 MED ORDER — KETOROLAC TROMETHAMINE 30 MG/ML IJ SOLN: 30.0000 mg | Freq: Once | INTRAMUSCULAR | Status: DC

## 2022-01-15 MED ORDER — SODIUM CHLORIDE 0.9 % IV SOLN: INTRAVENOUS | Status: DC

## 2022-01-15 NOTE — Discharge Instructions (Addendum)
Bone Aspiration After Care This sheet gives you information about how to care for yourself after your procedure. Your health care provider may also give you more specific instructions. If you have problems or questions, contact your health care provider. What can I expect after the procedure? After the procedure, it is common to have: Mild pain and tenderness. Swelling. Bruising. Follow these instructions at home: Puncture site care  Follow instructions from your health care provider about how to take care of the puncture site. Make sure you: Wash your hands with soap and water before and after you change your bandage (dressing). If soap and water are not available, use hand sanitizer. Change your dressing as told by your health care provider. Check your puncture site every day for signs of infection. Check for: More redness, swelling, or pain. Fluid or blood. Warmth. Pus or a bad smell. Activity Return to your normal activities as told by your health care provider. Ask your health care provider what activities are safe for you. Do not lift anything that is heavier than 10 lb (4.5 kg), or the limit that you are told, until your health care provider says that it is safe. Do not drive for 24 hours if you were given a sedative during your procedure. General instructions  Take over-the-counter and prescription medicines only as told by your health care provider. Do not take baths, swim, or use a hot tub until your health care provider approves. Ask your health care provider if you may take showers. You may only be allowed to take sponge baths. If directed, put ice on the affected area. To do this: Put ice in a plastic bag. Place a towel between your skin and the bag. Leave the ice on for 20 minutes, 2-3 times a day. Keep all follow-up visits as told by your health care provider. This is important.  Please call [phone] If you experience any of the following: Your pain is not  controlled with medicine. You have a fever. You have more redness, swelling, or pain around the puncture site. You have fluid or blood coming from the puncture site. Your puncture site feels warm to the touch. You have pus or a bad smell coming from the puncture site. Summary After the procedure, it is common to have mild pain, tenderness, swelling, and bruising. Follow instructions from your health care provider about how to take care of the puncture site and what activities are safe for you. Take over-the-counter and prescription medicines only as told by your health care provider. Contact a health care provider if you have any signs of infection, such as fluid or blood coming from the puncture site. This information is not intended to replace advice given to you by your health care provider. Make sure you discuss any questions you have with your health care provider. Document Revised: 04/28/2019 Document Reviewed: 04/28/2019 Elsevier Patient Education  [place].

## 2022-01-15 NOTE — Progress Notes (Signed)
Pt back in nursing recovery area. Pt still drowsy from procedure but will wake up when spoken to. Pt follows commands, talks in complete sentences and has no complaints at this time. Pt will be monitored until discharged by Radiologist.   

## 2022-01-17 ENCOUNTER — Telehealth: Payer: Self-pay

## 2022-01-17 ENCOUNTER — Other Ambulatory Visit: Payer: Self-pay | Admitting: Nurse Practitioner

## 2022-01-17 MED ORDER — CELECOXIB 200 MG PO CAPS: 200.0000 mg | ORAL_CAPSULE | Freq: Two times a day (BID) | ORAL | 0 refills | Status: DC

## 2022-01-17 MED ORDER — OXYCODONE HCL ER 10 MG PO T12A
20.0000 mg | EXTENDED_RELEASE_TABLET | Freq: Two times a day (BID) | ORAL | 0 refills | Status: DC
Start: 2022-01-17 — End: 2022-03-06

## 2022-01-17 MED ORDER — OXYCODONE HCL 5 MG PO TABS
5.0000 mg | ORAL_TABLET | Freq: Four times a day (QID) | ORAL | 0 refills | Status: DC | PRN
Start: 2022-01-17 — End: 2022-01-22

## 2022-01-17 NOTE — Telephone Encounter (Signed)
Frederick Ponce called tearful expressing pain has increased with no relief despite all efforts and medications. His previous appointment was cancelled by patient as he felt he was doing well at that time.   Advised Frederick Ponce) to increase Oxycontin to 20mg , and Celebrex to twice a day. Education provided on no ibuprofen use with Celebrex. I have also sent in prescription for Oxy IR with plans to see patient in the office on Monday and further evaluate symptom needs.   Frederick Love, NP Palliative Medicine/[facility]

## 2022-01-17 NOTE — Telephone Encounter (Signed)
Mr. Giannelli spouse, [name], called our office to inform us that Mr. Choyce pain has significantly increased. He stated that they wanted to make an appt with our office to help manage his pain. We have scheduled an appt for 1/30 at 10am. In the meantime, per Lexine Baton, NP, he should increase his Oxycontin dose to 20mg  twice a day and increase the Celebrex to twice a day. She is also adding Oxy IR 5mg  every 4 hours as needed for breakthrough pain. I explained this to [name] and understanding was verbalized. All questions answered. Advised to call our office with any questions/concerns.

## 2022-01-18 ENCOUNTER — Telehealth: Payer: Self-pay | Admitting: Physician Assistant

## 2022-01-18 DIAGNOSIS — C61 Malignant neoplasm of prostate: Secondary | ICD-10-CM | POA: Insufficient documentation

## 2022-01-18 LAB — SURGICAL PATHOLOGY

## 2022-01-18 NOTE — Telephone Encounter (Signed)
I spoke to [name] on the phone to review the biopsy results that were finalized today. 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 Patient will see medical oncologist, Dr. Alen Blew, to discuss treatment recommendations on 01/23/2022 at 2pm. Additionally, Dr. Alen Blew recommended to initiate Firmagon subcutaneous injections following consultation. Due to ongoing left iliac bone pain, urgent referral to radiation oncology is placed.   He added that his pain has improved with the new pain regimen that was proposed by the palliative care team yesterday.   Patient expressed understanding of the plan provided. He is aware to reach out to [place] if he has any further questions.

## 2022-01-19 ENCOUNTER — Telehealth: Payer: Self-pay

## 2022-01-19 NOTE — Telephone Encounter (Signed)
Mr. Chew called our office and left a VM stating that he has broken out into a rash from head to toe. He stated that it doesn't itch or burn. He stated that he did already have an appt with his PCP today and that he sent in for Prednisone. He wanted to make sure that we were okay with him taking this. Per Murray Hodgkins, [place], that is okay for him to take and she did not recommend stopping his pain medication. I relayed this information to Mr. Edelen. I advised him to go to the ED or call 911 if the rash worsens or affects his breathing in any way. Understanding verbalized. All questions answered. Will follow up with Mr. Cansler on Monday at his appt with [place].

## 2022-01-20 ENCOUNTER — Emergency Department (HOSPITAL_COMMUNITY)
Admission: EM | Admit: 2022-01-20 | Discharge: 2022-01-20 | Disposition: A | Discharge status: Home / Self Care | Payer: Medicare Other | Attending: Emergency Medicine | Admitting: Emergency Medicine

## 2022-01-20 ENCOUNTER — Other Ambulatory Visit: Payer: Self-pay

## 2022-01-20 DIAGNOSIS — R102 Pelvic and perineal pain: Secondary | ICD-10-CM | POA: Insufficient documentation

## 2022-01-20 DIAGNOSIS — R21 Rash and other nonspecific skin eruption: Secondary | ICD-10-CM | POA: Insufficient documentation

## 2022-01-20 DIAGNOSIS — M545 Low back pain, unspecified: Secondary | ICD-10-CM | POA: Insufficient documentation

## 2022-01-20 DIAGNOSIS — Z79899 Other long term (current) drug therapy: Secondary | ICD-10-CM | POA: Diagnosis not present

## 2022-01-20 DIAGNOSIS — Z8546 Personal history of malignant neoplasm of prostate: Secondary | ICD-10-CM | POA: Diagnosis not present

## 2022-01-20 LAB — COMPREHENSIVE METABOLIC PANEL
ALT: 10 U/L (ref 0–44)
AST: 31 U/L (ref 15–41)
Albumin: 3.9 g/dL (ref 3.5–5.0)
Alkaline Phosphatase: 239 U/L — ABNORMAL HIGH (ref 38–126)
Anion gap: 8 (ref 5–15)
BUN: 27 mg/dL — ABNORMAL HIGH (ref 8–23)
CO2: 23 mmol/L (ref 22–32)
Calcium: 8.9 mg/dL (ref 8.9–10.3)
Chloride: 106 mmol/L (ref 98–111)
Creatinine, Ser: 0.79 mg/dL (ref 0.61–1.24)
GFR, Estimated: >60 mL/min (ref >60)
Glucose, Bld: 136 mg/dL — ABNORMAL HIGH (ref 70–99)
Potassium: 3.9 mmol/L (ref 3.5–5.1)
Sodium: 137 mmol/L (ref 135–145)
Total Bilirubin: 1 mg/dL (ref 0.3–1.2)
Total Protein: 6.4 g/dL — ABNORMAL LOW (ref 6.5–8.1)

## 2022-01-20 LAB — URINALYSIS, ROUTINE W REFLEX MICROSCOPIC
Bacteria, UA: NONE SEEN
Bilirubin Urine: NEGATIVE
Glucose, UA: NEGATIVE mg/dL
Hgb urine dipstick: NEGATIVE
Ketones, ur: 5 mg/dL — AB
Nitrite: NEGATIVE
Protein, ur: 30 mg/dL — AB
Specific Gravity, Urine: 1.027 (ref 1.005–1.030)
pH: 5 (ref 5.0–8.0)

## 2022-01-20 LAB — CBC WITH DIFFERENTIAL/PLATELET
Abs Immature Granulocytes: 0.02 10*3/uL (ref 0.00–0.07)
Basophils Absolute: 0 10*3/uL (ref 0.0–0.1)
Basophils Relative: 0 %
Eosinophils Absolute: 0.2 10*3/uL (ref 0.0–0.5)
Eosinophils Relative: 3 %
HCT: 40.2 % (ref 39.0–52.0)
Hemoglobin: 13 g/dL (ref 13.0–17.0)
Immature Granulocytes: 0 %
Lymphocytes Relative: 10 %
Lymphs Abs: 0.6 10*3/uL — ABNORMAL LOW (ref 0.7–4.0)
MCH: 33.4 pg (ref 26.0–34.0)
MCHC: 32.3 g/dL (ref 30.0–36.0)
MCV: 103.3 fL — ABNORMAL HIGH (ref 80.0–100.0)
Monocytes Absolute: 0.2 10*3/uL (ref 0.1–1.0)
Monocytes Relative: 3 %
Neutro Abs: 5.1 10*3/uL (ref 1.7–7.7)
Neutrophils Relative %: 84 %
Platelets: 233 10*3/uL (ref 150–400)
RBC: 3.89 MIL/uL — ABNORMAL LOW (ref 4.22–5.81)
RDW: 14.1 % (ref 11.5–15.5)
WBC: 6.1 10*3/uL (ref 4.0–10.5)
nRBC: 0 % (ref 0.0–0.2)

## 2022-01-20 MED ORDER — MORPHINE SULFATE (PF) 4 MG/ML IV SOLN
4.0000 mg | Freq: Once | INTRAVENOUS | Status: AC
  Administered 2022-01-20: 4 mg via INTRAVENOUS
  Filled 2022-01-20: qty 1

## 2022-01-20 MED ORDER — DIPHENHYDRAMINE HCL 50 MG/ML IJ SOLN
25.0000 mg | Freq: Once | INTRAMUSCULAR | Status: AC
  Administered 2022-01-20: 25 mg via INTRAVENOUS
  Filled 2022-01-20: qty 1

## 2022-01-20 MED ORDER — DIPHENHYDRAMINE HCL 25 MG PO TABS: 25.0000 mg | ORAL_TABLET | Freq: Four times a day (QID) | ORAL | 0 refills | Status: DC | PRN

## 2022-01-20 MED ORDER — HYDROMORPHONE HCL 1 MG/ML IJ SOLN
1.0000 mg | Freq: Once | INTRAMUSCULAR | Status: AC
  Administered 2022-01-20: 1 mg via INTRAVENOUS
  Filled 2022-01-20: qty 1

## 2022-01-20 MED ORDER — OXYCODONE HCL 5 MG PO TABS
20.0000 mg | ORAL_TABLET | Freq: Once | ORAL | Status: DC
  Filled 2022-01-20: qty 4

## 2022-01-20 MED ORDER — PREDNISONE 20 MG PO TABS: 40.0000 mg | ORAL_TABLET | Freq: Every day | ORAL | 0 refills | Status: AC

## 2022-01-20 MED ORDER — DEXAMETHASONE SODIUM PHOSPHATE 10 MG/ML IJ SOLN
10.0000 mg | Freq: Once | INTRAMUSCULAR | Status: AC
  Administered 2022-01-20: 10 mg via INTRAVENOUS
  Filled 2022-01-20: qty 1

## 2022-01-20 MED ORDER — EPINEPHRINE 0.3 MG/0.3ML IJ SOAJ: 0.3000 mg | INTRAMUSCULAR | 0 refills | Status: DC | PRN

## 2022-01-20 NOTE — Discharge Instructions (Signed)
As we discussed, suspect your pain is from know lesions in your pelvis. Please take pain meds as prescribed as needed for pain. Additionally, suspect your rash is from allergy to recently started meds. I have given you steroids and Benadryl for management of this.  Please take as prescribed.  I have also given you a prescription for an EpiPen that you can use if you have signs of throat or mouth swelling or trouble breathing.  If this were to occur he would need to urgently come to the ER for further evaluation.  Please discuss with your oncology team for further management of your medications and pain.  Return if development of any new or worsening symptoms.

## 2022-01-20 NOTE — ED Triage Notes (Signed)
Patient reports he is recently diagnosed with prostate cancer, meets next week to discuss treatment options. Reports Tuesday morning he woke up with red rash all over face/torso. Pain in triage rated 10/10 left back, left leg.

## 2022-01-20 NOTE — ED Provider Notes (Signed)
[facility] DEPT Provider Note   CSN: [id number] Arrival date & time: 01/20/22  7829     History  Chief Complaint  Patient presents with   Rash   Back Pain    Frederick Ponce is a 76 y.o. male.  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 He states that the pain has been present for 3 months due to known pelvic mets, he has been prescribed oral oxycontin and oxycodone with Celebrex for pain management which has not controlled his pain today. It is currently 10/10. States that the pain shoots down his leg, his able to ambulate with some pain. He also states that Tuesday he awoke with a erythematous maculopapular rash throughout his whole body, sparing palms and soles and mucosa. It is not itchy and does not hurt. His primary doctor called him in steroids but he states that he doesn't want to take those due to them making him urinate more frequently. He denies any throat or mucosal swelling or any shortness of breath.  The history is provided by the patient. No language interpreter was used.  Rash Associated symptoms: no diarrhea, no fever, no nausea, no shortness of breath and not vomiting   Back Pain Associated symptoms: no chest pain and no fever       Home Medications Prior to Admission medications   Medication Sig Start Date End Date Taking? Authorizing Provider  Apoaequorin (PREVAGEN PO) Take by mouth.    [provider]  atorvastatin (LIPITOR) 40 MG tablet Take 40 mg by mouth daily.    [provider]  carbidopa-levodopa (SINEMET) 25-100 MG tablet Take 1.5 tablets by mouth daily. 09/18/21   Star Age, MD  celecoxib (CELEBREX) 200 MG capsule Take 1 capsule (200 mg total) by mouth 2 (two) times daily. 01/17/22   Pickenpack-Cousar, Carlena Sax, NP  co-enzyme Q-10 30 MG capsule Take 30 mg by mouth 3 (three) times daily.     [provider]  KRILL OIL PO Take by mouth.    [provider]  lidocaine (LIDODERM) 5 % Place 1 patch onto the skin daily. Remove & Discard patch within 12 hours or as directed by MD 01/05/22   Pickenpack-Cousar, Carlena Sax, NP  multivitamin-lutein (OCUVITE-LUTEIN) CAPS capsule Take 1 capsule by mouth daily.    [provider]  oxyCODONE (OXY IR/ROXICODONE) 5 MG immediate release tablet Take 1 tablet (5 mg total) by mouth every 6 (six) hours as needed for severe pain or breakthrough pain. 01/17/22   Pickenpack-Cousar, Carlena Sax, NP  oxyCODONE (OXYCONTIN) 10 mg 12 hr tablet Take 2 tablets (20 mg total) by mouth every 12 (twelve) hours. 01/17/22   Pickenpack-Cousar, Carlena Sax, NP  propranolol (INDERAL) 40 MG tablet Take 1 tablet (40 mg total) by mouth daily. Patient not taking: Reported on 12/27/2021 09/18/21   Star Age, MD  vitamin B-12 (CYANOCOBALAMIN) 1000 MCG tablet Take 1,000 mcg by mouth daily.    [provider]      Allergies    Patient has no known allergies.    Review of Systems   Review of Systems  Constitutional:  Negative for chills and fever.  Respiratory:  Negative for shortness of breath.   Cardiovascular:  Negative for chest pain.  Gastrointestinal:  Negative for diarrhea, nausea and vomiting.  Musculoskeletal:  Positive for back pain.  Skin:  Positive for rash.  All  other systems reviewed and are negative.  Physical Exam Updated Vital Signs BP (!) 147/86    Pulse 93    Temp 98.5 F (36.9 C) (Oral)    Resp (!) 21    Ht 5' 8" (1.727 m)    Wt 78.2 kg    SpO2 96%    BMI 26.21 kg/m  Physical Exam Vitals and nursing note reviewed.  Constitutional:      Comments: Patient standing in room walking around in some distress  HENT:     Head: Normocephalic and atraumatic.  Eyes:     Extraocular Movements: Extraocular movements intact.     Pupils: Pupils are equal, round, and reactive to light.  Cardiovascular:     Rate and Rhythm: Normal rate  and regular rhythm.     Heart sounds: Normal heart sounds.  Pulmonary:     Effort: Pulmonary effort is normal. No respiratory distress.     Breath sounds: Normal breath sounds.  Abdominal:     General: Abdomen is flat.     Palpations: Abdomen is soft.  Musculoskeletal:        General: Normal range of motion.     Cervical back: Normal range of motion.     Right lower leg: No edema.     Left lower leg: No edema.  Skin:    Comments: Diffuse maculopapular rash that is present from face to feet. Spares mucosa and palms and soles. Negative Nikolsky's sign.  Neurological:     General: No focal deficit present.  Psychiatric:        Mood and Affect: Mood normal.    ED Results / Procedures / Treatments   Labs (all labs ordered are listed, but only abnormal results are displayed) Labs Reviewed  URINALYSIS, ROUTINE W REFLEX MICROSCOPIC - Abnormal; Notable for the following components:      Result Value   Ketones, ur 5 (*)    Protein, ur 30 (*)    Leukocytes,Ua SMALL (*)    All other components within normal limits  CBC WITH DIFFERENTIAL/PLATELET - Abnormal; Notable for the following components:   RBC 3.89 (*)    MCV 103.3 (*)    Lymphs Abs 0.6 (*)    All other components within normal limits  COMPREHENSIVE METABOLIC PANEL - Abnormal; Notable for the following components:   Glucose, Bld 136 (*)    BUN 27 (*)    Total Protein 6.4 (*)    Alkaline Phosphatase 239 (*)    All other components within normal limits  CBC WITH DIFFERENTIAL/PLATELET  COMPREHENSIVE METABOLIC PANEL    EKG None  Radiology No results found.  Procedures Procedures    Medications Ordered in ED Medications  morphine 4 MG/ML injection 4 mg (4 mg Intravenous Given 01/20/22 1140)  diphenhydrAMINE (BENADRYL) injection 25 mg (25 mg Intravenous Given 01/20/22 1155)  dexamethasone (DECADRON) injection 10 mg (10 mg Intravenous Given 01/20/22 1155)  HYDROmorphone (DILAUDID) injection 1 mg (1 mg Intravenous Given  01/20/22 1241)  HYDROmorphone (DILAUDID) injection 1 mg (1 mg Intravenous Given 01/20/22 1423)    ED Course/ Medical Decision Making/ A&P                           Medical Decision Making Amount and/or Complexity of Data Reviewed Labs: ordered.  Risk Prescription drug management.   This patient presents to the ED for concern of back pain and rash, this involves an extensive number of treatment options, and is  a complaint that carries with it a high risk of complications and morbidity.    Co morbidities that complicate the patient evaluation  Metastatic prostate cancer with pelvic mets    Lab Tests:  I Ordered, and personally interpreted labs.  The pertinent results include:  no leukocytosis or anemia. Alk phos elevated consistent with bone metastasis   Imaging Studies ordered:  I ordered imaging studies including none    Medicines ordered and prescription drug management:  I ordered medication including morphine, dilaudid, and oxycodone  for pain. Decadron and Benadryl for hives  Reevaluation of the patient after these medicines showed that the patient improved I have reviewed the patients home medicines and have made adjustments as needed   Test Considered:  I considered repeat imaging, however patient with known metastatic cancer with many pelvic mets seen on CT 2 weeks ago. Pain is similar to baseline. No new imaging indicated   Reevaluation:  After the interventions noted above, I reevaluated the patient and found that they have :improved   Dispostion:  After consideration of the diagnostic results and the patients response to treatment, I feel that the patent would benefit from outpatient pain management with steroids and benadryl and oncology follow-up on monday.   Patient with known metastatic prostate cancer with palliative care appointment Monday and oncology appointment Tuesday coming in for pain control and rash. Rash consistent with hives from allergy.  Patient recently started several new meds following new cancer diagnosis. No signs of anaphylaxis at this time. Low suspicion for SJS or TEN as Nikolsky sign negative and spares oral mucosa. Patient is afebrile, non-toxic appearing and in no acute distress with reassuring vital signs. Low suspicion for RMSF or meningitis at this time. Patient would really like to go home today, his pain is well controlled at this time and he feels that his rash has improved with Benadryl and Decadron. Will recommend patient take steroids, benadryl and give Epi pen for potential future anaphylaxis reaction. Patient educated on use. Educated on red flag symptoms that would prompt immediate return. Close follow-up for medication adjustments recommended. Discharged in stable condition.   This is a shared visit with supervising physician Dr. Dina Rich who has independently evaluated patient & provided guidance in evaluation/management/disposition, in agreement with care    Final Clinical Impression(s) / ED Diagnoses Final diagnoses:  Pain in pelvis  Rash    Rx / DC Orders ED Discharge Orders          Ordered    predniSONE (DELTASONE) 20 MG tablet  Daily        01/20/22 1526    diphenhydrAMINE (BENADRYL) 25 MG tablet  Every 6 hours PRN        01/20/22 1526    EPINEPHrine 0.3 mg/0.3 mL IJ SOAJ injection  As needed        01/20/22 1526          An After Visit Summary was printed and given to the patient.     Bud Face, PA-C 01/20/22 [place], Alvin Critchley, DO 01/21/22 [phone]

## 2022-01-22 ENCOUNTER — Other Ambulatory Visit: Payer: Self-pay

## 2022-01-22 ENCOUNTER — Inpatient Hospital Stay (HOSPITAL_BASED_OUTPATIENT_CLINIC_OR_DEPARTMENT_OTHER): Payer: Medicare Other | Admitting: Nurse Practitioner

## 2022-01-22 ENCOUNTER — Encounter: Payer: Self-pay | Admitting: Nurse Practitioner

## 2022-01-22 VITALS — BP 141/73 | HR 88 | Temp 98.2°F | Resp 16 | Wt 174.3 lb

## 2022-01-22 DIAGNOSIS — Z515 Encounter for palliative care: Secondary | ICD-10-CM

## 2022-01-22 DIAGNOSIS — K5903 Drug induced constipation: Secondary | ICD-10-CM | POA: Diagnosis not present

## 2022-01-22 DIAGNOSIS — C7951 Secondary malignant neoplasm of bone: Secondary | ICD-10-CM

## 2022-01-22 DIAGNOSIS — C61 Malignant neoplasm of prostate: Secondary | ICD-10-CM | POA: Diagnosis not present

## 2022-01-22 DIAGNOSIS — G893 Neoplasm related pain (acute) (chronic): Secondary | ICD-10-CM

## 2022-01-22 DIAGNOSIS — Z5111 Encounter for antineoplastic chemotherapy: Secondary | ICD-10-CM | POA: Diagnosis not present

## 2022-01-22 NOTE — Progress Notes (Signed)
[place]  [phone]) [phone] [phone]   Name: [name] Date: 01/22/2022 MRN: [id number]  DOB: [birth year]  Patient Care Team: [facility] [place] as PCP - General    REASON FOR CONSULTATION: Zelig Gacek is a 76 y.o. male with medical history including Parkinson's disease and hyperlipidemia. 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 He is having urgent radiation appointment due to iliac bone pain. Palliative ask to see for symptom management.    SOCIAL HISTORY:     reports that he has never smoked. He has never used smokeless tobacco. He reports current alcohol use of about 21.0 standard drinks per week. He reports that he does not use drugs.  ADVANCE DIRECTIVES:    CODE STATUS:   PAST MEDICAL HISTORY: Past Medical History:  Diagnosis Date   Hyperlipidemia    Parkinson disease ([place])     PAST SURGICAL HISTORY: History reviewed. No pertinent surgical history.  HEMATOLOGY/ONCOLOGY HISTORY:  Oncology History   No history exists.    ALLERGIES:  has No Known Allergies.  MEDICATIONS:  Current Outpatient Medications  Medication Sig Dispense Refill   atorvastatin (LIPITOR) 40 MG tablet Take 40 mg by mouth daily.     carbidopa-levodopa (SINEMET) 25-100 MG tablet Take 1.5 tablets by mouth daily. 135 tablet 3   co-enzyme Q-10 30 MG capsule Take 30 mg by mouth 3 (three) times daily.     diphenhydrAMINE (BENADRYL) 25 MG tablet Take 1 tablet (25 mg total) by mouth every 6 (six) hours as needed. 30 tablet 0   EPINEPHrine 0.3 mg/0.3 mL IJ SOAJ injection Inject 0.3 mg into the muscle as needed for anaphylaxis. 1 each 0   KRILL OIL PO Take by mouth.     lidocaine (LIDODERM) 5 % Place 1 patch onto the skin daily. Remove & Discard patch within 12 hours or as directed by MD 30 patch 0    multivitamin-lutein (OCUVITE-LUTEIN) CAPS capsule Take 1 capsule by mouth daily.     oxyCODONE (OXYCONTIN) 10 mg 12 hr tablet Take 2 tablets (20 mg total) by mouth every 12 (twelve) hours. 60 tablet 0   predniSONE (DELTASONE) 20 MG tablet Take 2 tablets (40 mg total) by mouth daily for 5 days. 10 tablet 0   vitamin B-12 (CYANOCOBALAMIN) 1000 MCG tablet Take 1,000 mcg by mouth daily.     No current facility-administered medications for this visit.    VITAL SIGNS: BP (!) 141/73 (BP Location: Right Arm, Patient Position: Sitting)    Pulse 88    Temp 98.2 F (36.8 C) (Oral)    Resp 16    Wt 174 lb 5 oz (79.1 kg)    SpO2 98%    BMI 26.50 kg/m  Filed Weights   01/22/22 1007  Weight: 174 lb 5 oz (79.1 kg)    Estimated body mass index is 26.5 kg/m as calculated from the following:   Height as of 01/20/22: 5\' 8"  (1.727 m).   Weight as of this encounter: 174 lb 5 oz (79.1 kg).  LABS: CBC:    Component Value Date/Time   WBC 6.1 01/20/2022 1100   HGB 13.0 01/20/2022 1100   HGB 13.4 12/27/2021 1441   HCT 40.2 01/20/2022 1100   PLT 233 01/20/2022 1100   PLT 217 12/27/2021 1441   MCV 103.3 (H) 01/20/2022 1100   NEUTROABS 5.1 01/20/2022 1100   LYMPHSABS  0.6 (L) 01/20/2022 1100   MONOABS 0.2 01/20/2022 1100   EOSABS 0.2 01/20/2022 1100   BASOSABS 0.0 01/20/2022 1100   Comprehensive Metabolic Panel:    Component Value Date/Time   NA 137 01/20/2022 1100   NA 140 09/14/2021 0920   K 3.9 01/20/2022 1100   CL 106 01/20/2022 1100   CO2 23 01/20/2022 1100   BUN 27 (H) 01/20/2022 1100   BUN 18 09/14/2021 0920   CREATININE 0.79 01/20/2022 1100   CREATININE 0.86 12/27/2021 1441   GLUCOSE 136 (H) 01/20/2022 1100   CALCIUM 8.9 01/20/2022 1100   AST 31 01/20/2022 1100   AST 24 12/27/2021 1441   ALT 10 01/20/2022 1100   ALT 22 12/27/2021 1441   ALKPHOS 239 (H) 01/20/2022 1100   BILITOT 1.0 01/20/2022 1100   BILITOT 1.0 12/27/2021 1441   PROT 6.4 (L) 01/20/2022 1100   PROT 6.3 09/14/2021  0920   ALBUMIN 3.9 01/20/2022 1100   ALBUMIN 4.2 09/14/2021 0920    PERFORMANCE STATUS (ECOG) : 1 - Symptomatic but completely ambulatory   Physical Exam General: NAD Pulmonary: Normal breathing pattern  Abdomen: soft, nontender, + bowel sounds Extremities: no edema, no joint deformities Skin: facial flushing, scattered pink rash on abdomen and upper torso (much improved) Neurological: AAO x3,   IMPRESSION:  Mr. Holan presents to the clinic today with his husband for follow-up. Since our last visit he required ED visit for red skin rash after starting Oxy IR in addition to increasing Celebrex to twice daily. His PCP or EDP was unable to pinpoint cause and he was started on prednisone in addition to benadryl. He did not take the prednisone due to concerns for possible side-effects.   Rash is much improved. Denies itching or pain. Continues to be ambulatory. He has a radiation appointment this week which they are anxiously anticipating. We discussed at length future expectations. He also has upcoming appointment with Dr. Alen Blew.   Left Iliac bone pain  During previous visit [place] was started on OxyContin 10 mg twice daily in addition to Celebrex and Lidoderm patches.  After several days his pain continues to be uncontrolled and he was instructed to increase OxyContin to 20 mg twice daily in addition to Oxy IR 5 mg for breakthrough pain.  As mentioned above unfortunately he developed a widespread skin rash and discontinued taking both Celebrex and Oxy IR.  He feels his pain is controlled at this time.  He is also taking Tylenol as needed.  Given he was already taking Celebrex and we will increase the dosage participate his reaction may have been to Oxy IR.  We will list this as an intolerance. He is paying over $300 a month for his Oxycontin, discussed transitioning to MS Contin. He and [name] verbalized agreement. He will complete the course he has left with plans to pick-up MS Contin when time  for refill. Education provided on MS Contin and signs of reaction or intolerance.    Constipation Continues prune juice with Miralax available for his constipation. He understands importance of good bowel regimen in the setting of pain medication.     PLAN: Continue Oxycontin 20 mg every 12hrs as previously prescribed. Once course completed will transition to MS Contin 15 mg BID due to cost. Conversion is for 22.5mg  however will dose reduce and escalate as needed.  Tylenol extra strength 3 times a day as needed Discontinue Celebrex and Oxy IR use due to rash and "questionable hallucinations". Will list Oxy IR  as intolerance given he tolerated 200mg  daily of Celebrex prior to increase.  Education provided on discontinuing use of ibuprofen/Advil. Lidoderm patch daily Continue daily moisturizer to rash area. If no improvement he understands he may need to take prednisone.  MiraLAX daily.  May continue prune juice for bowel regimen. I will plan to follow-up with patient by phone on next week to evaluate pain.  We will see patient back in 1-2 weeks in collaboration to other oncology appointments.   Patient expressed understanding and was in agreement with this plan. He also understands that He can call the clinic at any time with any questions, concerns, or complaints.   Time Total: 40 min   Visit consisted of counseling and education dealing with the complex and emotionally intense issues of symptom management and palliative care in the setting of serious and potentially life-threatening illness.Greater than 50%  of this time was spent counseling and coordinating care related to the above assessment and plan.  Alda Lea, AGPCNP-BC  [place]

## 2022-01-23 ENCOUNTER — Inpatient Hospital Stay: Payer: Medicare Other

## 2022-01-23 ENCOUNTER — Inpatient Hospital Stay (HOSPITAL_BASED_OUTPATIENT_CLINIC_OR_DEPARTMENT_OTHER): Payer: Medicare Other | Admitting: Oncology

## 2022-01-23 ENCOUNTER — Ambulatory Visit (HOSPITAL_COMMUNITY): Admission: RE | Admit: 2022-01-23 | Payer: Medicare Other | Source: Ambulatory Visit

## 2022-01-23 VITALS — BP 136/73 | HR 88 | Temp 97.6°F | Resp 17 | Ht 68.0 in | Wt 175.0 lb

## 2022-01-23 DIAGNOSIS — C7951 Secondary malignant neoplasm of bone: Secondary | ICD-10-CM | POA: Diagnosis not present

## 2022-01-23 DIAGNOSIS — C61 Malignant neoplasm of prostate: Secondary | ICD-10-CM | POA: Diagnosis not present

## 2022-01-23 DIAGNOSIS — Z5111 Encounter for antineoplastic chemotherapy: Secondary | ICD-10-CM | POA: Diagnosis not present

## 2022-01-23 MED ORDER — PROCHLORPERAZINE MALEATE 10 MG PO TABS: 10.0000 mg | ORAL_TABLET | Freq: Four times a day (QID) | ORAL | 0 refills | Status: DC | PRN

## 2022-01-23 MED ORDER — DEGARELIX ACETATE(240 MG DOSE) 120 MG/VIAL ~~LOC~~ SOLR
240.0000 mg | Freq: Once | SUBCUTANEOUS | Status: AC
  Administered 2022-01-23: 240 mg via SUBCUTANEOUS
  Filled 2022-01-23: qty 6

## 2022-01-23 MED ORDER — MORPHINE SULFATE ER 15 MG PO TBCR: 15.0000 mg | EXTENDED_RELEASE_TABLET | Freq: Two times a day (BID) | ORAL | 0 refills | Status: DC

## 2022-01-23 NOTE — Progress Notes (Signed)
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

## 2022-01-23 NOTE — Progress Notes (Signed)
Reason for the request:    Prostate cancer  HPI: I was asked by Dr. Carney Bern to evaluate [name] for the diagnosis of prostate cancer.  He is a 76 year old man with history of mild hyperlipidemia and Parkinson's disease who was found to have abnormal imaging studies of the bone.  He was complaining of back and pelvic pain and MRI of the lumbar spine on 12/07/2021 showed a T1 hyperintensity lesion involving the left iliac bone as well as S2 and S3 vertebral body suspicious for malignancy.  His evaluation included a CT scan chest abdomen pelvis on January 05, 2022 which showed widespread extensive sclerotic metastatic lesions as well as widespread pulmonary involvement including hilar adenopathy.  He underwent CT-guided biopsy obtained on January 15, 2022 which showed metastatic prostate cancer.  His PSA on December 27, 2021 was 22.5.  He was evaluated in the emergency department for increased pain and subsequently was started on OxyContin and oxycodone for breakthrough pain medication.  He was also evaluated by palliative medicine services and was suggested to change to MS Contin 50 mg twice a day and use Tylenol extra strength for breakthrough.  Clinically, he reports his pain is overall manageable with the current regimen.  He is ambulating without any major difficulties.  He denies any chest pain or shortness of breath.  He denies any recent hospitalizations but was seen in the emergency department.   He does not report any headaches, blurry vision, syncope or seizures. Does not report any fevers, chills or sweats.  Does not report any cough, wheezing or hemoptysis.  Does not report any chest pain, palpitation, orthopnea or leg edema.  Does not report any nausea, vomiting or abdominal pain.  Does not report any constipation or diarrhea.  Does not report any skeletal complaints.    Does not report frequency, urgency or hematuria.  Does not report any skin rashes or lesions. Does not report any heat or cold  intolerance.  Does not report any lymphadenopathy or petechiae.  Does not report any anxiety or depression.  Remaining review of systems is negative.     Past Medical History:  Diagnosis Date   Hyperlipidemia    Parkinson disease ([place])   :  No past surgical history on file.:   Current Outpatient Medications:    atorvastatin (LIPITOR) 40 MG tablet, Take 40 mg by mouth daily., Disp: , Rfl:    carbidopa-levodopa (SINEMET) 25-100 MG tablet, Take 1.5 tablets by mouth daily., Disp: 135 tablet, Rfl: 3   co-enzyme Q-10 30 MG capsule, Take 30 mg by mouth 3 (three) times daily., Disp: , Rfl:    diphenhydrAMINE (BENADRYL) 25 MG tablet, Take 1 tablet (25 mg total) by mouth every 6 (six) hours as needed., Disp: 30 tablet, Rfl: 0   EPINEPHrine 0.3 mg/0.3 mL IJ SOAJ injection, Inject 0.3 mg into the muscle as needed for anaphylaxis., Disp: 1 each, Rfl: 0   KRILL OIL PO, Take by mouth., Disp: , Rfl:    lidocaine (LIDODERM) 5 %, Place 1 patch onto the skin daily. Remove & Discard patch within 12 hours or as directed by MD, Disp: 30 patch, Rfl: 0   multivitamin-lutein (OCUVITE-LUTEIN) CAPS capsule, Take 1 capsule by mouth daily., Disp: , Rfl:    oxyCODONE (OXYCONTIN) 10 mg 12 hr tablet, Take 2 tablets (20 mg total) by mouth every 12 (twelve) hours., Disp: 60 tablet, Rfl: 0   predniSONE (DELTASONE) 20 MG tablet, Take 2 tablets (40 mg total) by mouth daily for 5  days., Disp: 10 tablet, Rfl: 0   vitamin B-12 (CYANOCOBALAMIN) 1000 MCG tablet, Take 1,000 mcg by mouth daily., Disp: , Rfl: :  No Known Allergies:   Family History  Problem Relation Age of Onset   Tremor Father    Melanoma Sister   :   Social History   Socioeconomic History   Marital status: Married    Spouse name: Not on file   Number of children: Not on file   Years of education: Not on file   Highest education level: Not on file  Occupational History   Not on file  Tobacco Use   Smoking status: Never   Smokeless tobacco: Never   Vaping Use   Vaping Use: Never used  Substance and Sexual Activity   Alcohol use: Yes    Alcohol/week: 21.0 standard drinks    Types: 21 Glasses of wine per week   Drug use: Never   Sexual activity: Not on file  Other Topics Concern   Not on file  Social History Narrative   Not on file   Social Determinants of Health   Financial Resource Strain: Not on file  Food Insecurity: Not on file  Transportation Needs: Not on file  Physical Activity: Not on file  Stress: Not on file  Social Connections: Not on file  Intimate Partner Violence: Not on file  :  Pertinent items are noted in HPI.  Exam: Blood pressure 136/73, pulse 88, temperature 97.6 F (36.4 C), temperature source Temporal, resp. rate 17, height 5\' 8"  (1.727 m), weight 175 lb (79.4 kg), SpO2 96 %. ECOG 1 General appearance: alert and cooperative appeared without distress. Head: atraumatic without any abnormalities. Eyes: conjunctivae/corneas clear. PERRL.  Sclera anicteric. Throat: lips, mucosa, and tongue normal; without oral thrush or ulcers. Resp: clear to auscultation bilaterally without rhonchi, wheezes or dullness to percussion. Cardio: regular rate and rhythm, S1, S2 normal, no murmur, click, rub or gallop GI: soft, non-tender; bowel sounds normal; no masses,  no organomegaly Skin: Skin color, texture, turgor normal. No rashes or lesions Lymph nodes: Cervical, supraclavicular, and axillary nodes normal. Neurologic: Grossly normal without any motor, sensory or deep tendon reflexes. Musculoskeletal: No joint deformity or effusion.   CT CHEST ABDOMEN PELVIS W CONTRAST  Result Date: 01/05/2022 CLINICAL DATA:  left iliac bone abnormality on outside MRI. Evaluate for metastatic disease and targeted biopsy. Shortness of breath with constipation. Left hip and back pain. EXAM: CT CHEST, ABDOMEN, AND PELVIS WITH CONTRAST TECHNIQUE: Multidetector CT imaging of the chest, abdomen and pelvis was performed following the  standard protocol during bolus administration of intravenous contrast. RADIATION DOSE REDUCTION: This exam was performed according to the departmental dose-optimization program which includes automated exposure control, adjustment of the mA and/or kV according to patient size and/or use of iterative reconstruction technique. CONTRAST:  142mL OMNIPAQUE IOHEXOL 300 MG/ML  SOLN COMPARISON:  Lumbar spine MRI 12/07/2021. FINDINGS: CT CHEST FINDINGS Cardiovascular: Aortic atherosclerosis. Tortuous thoracic aorta. Mild cardiomegaly, without pericardial effusion. Lad coronary artery calcification. No central pulmonary embolism, on this non-dedicated study. Mediastinum/Nodes: No supraclavicular adenopathy. No axillary adenopathy. Right hilar adenopathy at 2.3 x 2.0 cm on 25/2. No mediastinal adenopathy. Lungs/Pleura: No pleural fluid. Innumerable bilateral pulmonary nodules. Index right upper lobe 11 mm nodule on 40/4. A left upper lobe pulmonary nodule of 1.2 cm on 49/4. Left lower lobe pulmonary nodule of 1.7 x 1.4 cm on 96/4. Musculoskeletal: No acute osseous abnormality. CT ABDOMEN PELVIS FINDINGS Hepatobiliary: High right hepatic lobe 1.0 cm  hypoattenuating lesion on 46/2 is favored to represent hemangioma, including peripheral nodular enhancement on 47/2. More caudal right hepatic lobe lesion is too small to characterize at approximately 4 mm. Normal gallbladder, without biliary ductal dilatation. Pancreas: Normal, without mass or ductal dilatation. Spleen: Normal in size, without focal abnormality. Adrenals/Urinary Tract: Normal adrenal glands. Bilateral renal collecting system calculi, including a dominant stone within the right renal pelvis of 12 mm. Moderate right-sided caliectasis without hydroureter. Normal urinary bladder. Stomach/Bowel: Normal stomach, without wall thickening. Nonobstructive sigmoid colon positioned within a moderate right inguinal hernia including on 115/2. No findings of strangulation. Normal  terminal ileum and appendix. Normal small bowel. Vascular/Lymphatic: Aortic atherosclerosis. No abdominopelvic adenopathy. Reproductive: Moderate prostatomegaly with subtle nodule or node just posterior to the prostatic base at 5 mm on 102/2. Heterogeneous prostatic enhancement with subtle ill definition of prostatic capsule, including right of midline on 104/2. Other: No significant free fluid. No evidence of omental or peritoneal disease. Musculoskeletal: Left sacral, iliac, and parasymphyseal bilateral pubic relatively diffuse sclerosis. IMPRESSION: CT CHEST IMPRESSION 1. Widespread pulmonary metastasis. 2. Right hilar adenopathy, likely representing nodal metastasis. 3. No primary malignancy identified within the chest. 4. Coronary artery atherosclerosis. Aortic Atherosclerosis (ICD10-I70.0). CT ABDOMEN AND PELVIS IMPRESSION 1. Extensive pelvic sclerotic osseous metastasis. 2. Prostatomegaly with ill definition of prostatic capsule and subtle periprosthetic nodule or node. Suspect primary prostate carcinoma. Correlate with physical exam and PSA. The patient's nodal and pulmonary metastasis may represent an aggressive appearance of prostate primary or a synchronous second primary. PMSA PET may be informative. 3. Bilateral nephrolithiasis with right-sided caliectasis but no hydroureter. Favor chronic ureteropelvic junction obstruction. 4. Nonobstructive sigmoid colon containing right inguinal hernia. Electronically Signed   By: Abigail Miyamoto M.D.   On: 01/05/2022 14:09   CT BONE TROCAR/NEEDLE BIOPSY DEEP  Result Date: 01/15/2022 INDICATION: biopsy of left iliac bone. abnormal MRI finding with pain. EXAM: CT BIOPSY CORE BONE DEEP MEDICATIONS: None. ANESTHESIA/SEDATION: Moderate (conscious) sedation was employed during this procedure. A total of Versed 2.5 mg and Fentanyl 175 mcg was administered intravenously. Moderate Sedation Time: 29 minutes. The patient's level of consciousness and vital signs were monitored  continuously by radiology nursing throughout the procedure under my direct supervision. FLUOROSCOPY TIME:  N/a COMPLICATIONS: None immediate. PROCEDURE: Informed written consent was obtained from the patient after a thorough discussion of the procedural risks, benefits and alternatives. All questions were addressed. Maximal Sterile Barrier Technique was utilized including caps, mask, sterile gowns, sterile gloves, sterile drape, hand hygiene and skin antiseptic. A timeout was performed prior to the initiation of the procedure. The patient was placed prone on the CT exam table. Limited CT of the pelvis was performed for planning purposes. CT again demonstrated diffuse sclerotic appearance of the left posterior ilium. Skin entry site was marked, and the overlying skin was prepped and draped in the standard sterile fashion. Local analgesia was obtained with 1% lidocaine. Using CT guidance, a 10 gauge bone introducer needle was advanced just deep to the cortex of the left posterior ilium. Subsequently, core needle biopsy was performed using 10 gauge core biopsy device x2 passes. Specimens were submitted to pathology for handling. Hemostasis was achieved with manual pressure, and a clean dressing was placed. The patient tolerated the procedure well without immediate complication. IMPRESSION: Successful CT-guided core biopsy of sclerotic lesion in the left posterior ilium. Electronically Signed   By: Albin Felling M.D.   On: 01/15/2022 09:56    Assessment and Plan:   76 year old with:  1.  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  He has extensive bone involvement and a PSA of 22.5.  The natural course of this disease was reviewed at this time and treatment choices were reiterated.  Androgen deprivation therapy remains the backbone to treat this disease although the treatment is palliative.  Complications associated with this  treatment clinic weight gain and hot flashes and sexual dysfunction were reiterated.  Therapy escalation were discussed at this time which is critical given his poorly differentiated tumor likely aggressive disease.  Taxotere chemotherapy versus androgen receptor pathway inhibitors were discussed.  Complication associated with both were reviewed and debated at this time.  After discussion I have recommended proceeding with Taxotere chemotherapy.   Complication associated with chemotherapy to include nausea, vomiting, myelosuppression, neutropenia and possible sepsis were reiterated.  After discussion he is agreeable to proceed and the plan is to give 6 cycles of therapy in the near future.   2.  IV access: Peripheral veins will be used at this time and Port-A-Cath option will be deferred.   3.  Antiemetics: Prescription for Compazine will be available to him.  4.  Androgen deprivation therapy: He will receive Mills Koller today and repeated in 4 weeks.  He will receive Eligard subsequently.  5.  Bone pain: His pain is manageable and will transition him into morphine sulfate long-acting moving forward.  He is also under evaluation for possible radiation therapy.  6.  Growth factor support: He will receive injection after each cycle of chemotherapy given his risk of neutropenia and possible sepsis.  7.  Follow-up: We will be in the near future for the start of chemotherapy.    60  minutes were dedicated to this visit. The time was spent on reviewing laboratory data, imaging studies, discussing treatment options, and answering questions regarding future plan.     A copy of this consult has been forwarded to the requesting physician.

## 2022-01-23 NOTE — Patient Instructions (Addendum)
\[id number]\TIRWERXVQ injection What is this medication? DEGARELIX (deg a REL ix) is used to treat men with advanced prostate cancer. This medicine may be used for other purposes; ask your health care provider or pharmacist if you have questions. COMMON BRAND NAME(S): Degarelix, Mills Koller What should I tell my care team before I take this medication? They need to know if you have any of these conditions: diabetes heart disease kidney disease liver disease low levels of potassium or magnesium in the blood osteoporosis an unusual or allergic reaction to degarelix, mannitol, other medicines, foods, dyes, or preservatives pregnant or trying to get pregnant breast-feeding How should I use this medication? This medicine is for injection under the skin. It is usually given by a health care professional in a hospital or clinic setting. If you get this medicine at home, you will be taught how to prepare and give this medicine. Use exactly as directed. Take your medicine at regular intervals. Do not take it more often than directed. It is important that you put your used needles and syringes in a special sharps container. Do not put them in a trash can. If you do not have a sharps container, call your pharmacist or healthcare provider to get one. Talk to your pediatrician regarding the use of this medicine in children. Special care may be needed. Overdosage: If you think you have taken too much of this medicine contact a poison control center or emergency room at once. NOTE: This medicine is only for you. Do not share this medicine with others. What if I miss a dose? Try not to miss a dose. If you do miss a dose, call your doctor or health care professional for advice. What may interact with this medication? Do not take this medicine with any of the following medications: cisapride dronedarone pimozide thioridazine This medicine may also interact with the following medications: other medicines  that prolong the QT interval (abnormal heart rhythm) This list may not describe all possible interactions. Give your health care provider a list of all the medicines, herbs, non-prescription drugs, or dietary supplements you use. Also tell them if you smoke, drink alcohol, or use illegal drugs. Some items may interact with your medicine. What should I watch for while using this medication? Visit your doctor or health care professional for regular checks on your progress and discuss any issues before you start taking this medicine. Do not rub or scratch injection site. There may be a lump at the injection site, or it may be red or sore for a few days after your dose. Your doctor or health care professional will need to monitor your hormone levels in your blood to check your response to treatment. Try to keep any appointments for testing. What side effects may I notice from receiving this medication? Side effects that you should report to your doctor or health care professional as soon as possible: allergic reactions like skin rash, itching or hives, swelling of the face, lips, or tongue fever or chills irregular heartbeat nausea and vomiting along with severe abdominal pain pain or difficulty passing urine pelvic pain or bloating signs and symptoms of high blood sugar such as being more thirsty or hungry or having to urinate more than normal. You may also feel very tired or have blurry vision Side effects that usually do not require medical attention (report to your doctor or health care professional if they continue or are bothersome): change in sex drive or performance constipation headache high blood pressure hot flashes (  flushing of skin, increased sweating) itching, redness or mild pain at site where injected joint pain trouble sleeping unusually weak or tired weight gain This list may not describe all possible side effects. Call your doctor for medical advice about side effects. You  may report side effects to FDA at 1-800-FDA-1088. Where should I keep my medication? Keep out of the reach of children. This drug is usually given in a hospital or clinic and will not be stored at home. In rare cases, this medicine may be given at home. If you are using this medicine at home, you will be instructed on how to store this medicine. Throw away any unused medicine after the expiration date on the label. NOTE: This sheet is a summary. It may not cover all possible information. If you have questions about this medicine, talk to your doctor, pharmacist, or health care provider.  2022 Elsevier/Gold Standard (2021-08-29 00:00:00)

## 2022-01-24 DIAGNOSIS — G20 Parkinson's disease: Secondary | ICD-10-CM | POA: Insufficient documentation

## 2022-01-24 DIAGNOSIS — E785 Hyperlipidemia, unspecified: Secondary | ICD-10-CM | POA: Insufficient documentation

## 2022-01-24 DIAGNOSIS — E663 Overweight: Secondary | ICD-10-CM | POA: Insufficient documentation

## 2022-01-24 NOTE — Progress Notes (Signed)
Histology and Location of Primary Cancer: Metastatic Prostate Cancer and PSA of 22.5 as of 12/2021.  Sites of Visceral and Bony Metastatic Disease:  T1 hyperintensity lesion involving the left iliac bone as well as S2 and S3 vertebral body.  Location(s) of Symptomatic Metastases: Widespread extensive sclerotic metastatic lesions as well as widespread pulmonary involvement including hilar adenopathy.  Past/Anticipated chemotherapy by medical oncology, if any:   Androgen deprivation therapy remains the backbone to treat this disease although the treatment is palliative.  Proceeding with Taxotere chemotherapy on 02/01/2022.  Pain on a scale of 0-10 is:    6/10  back and left leg.  IPSS:  26 SHIM:  14  If Spine Met(s), symptoms, if any, include: Bowel/Bladder retention or incontinence (please describe): No bowel and urinary issues yes no incontinence. Numbness or weakness in extremities (please describe):   yes feet Current Decadron regimen, if applicable:  No  Ambulatory status? Walker? Wheelchair?:  No  SAFETY ISSUES: Prior radiation?   No Pacemaker/ICD?   No Possible current pregnancy?  Male Is the patient on methotrexate?  No  Current Complaints / other details:

## 2022-01-25 ENCOUNTER — Other Ambulatory Visit: Payer: Self-pay

## 2022-01-25 ENCOUNTER — Ambulatory Visit
Admission: RE | Admit: 2022-01-25 | Discharge: 2022-01-25 | Disposition: A | Discharge status: Home / Self Care | Payer: Medicare Other | Source: Ambulatory Visit | Attending: Radiation Oncology | Admitting: Radiation Oncology

## 2022-01-25 ENCOUNTER — Telehealth: Payer: Self-pay | Admitting: Oncology

## 2022-01-25 VITALS — BP 147/77 | HR 76 | Temp 97.8°F | Resp 20 | Ht 68.0 in | Wt 172.8 lb

## 2022-01-25 DIAGNOSIS — G20 Parkinson's disease: Secondary | ICD-10-CM

## 2022-01-25 DIAGNOSIS — C7801 Secondary malignant neoplasm of right lung: Secondary | ICD-10-CM | POA: Insufficient documentation

## 2022-01-25 DIAGNOSIS — I700 Atherosclerosis of aorta: Secondary | ICD-10-CM | POA: Insufficient documentation

## 2022-01-25 DIAGNOSIS — K769 Liver disease, unspecified: Secondary | ICD-10-CM | POA: Insufficient documentation

## 2022-01-25 DIAGNOSIS — E785 Hyperlipidemia, unspecified: Secondary | ICD-10-CM

## 2022-01-25 DIAGNOSIS — C7951 Secondary malignant neoplasm of bone: Secondary | ICD-10-CM | POA: Diagnosis not present

## 2022-01-25 DIAGNOSIS — C61 Malignant neoplasm of prostate: Secondary | ICD-10-CM

## 2022-01-25 DIAGNOSIS — K4090 Unilateral inguinal hernia, without obstruction or gangrene, not specified as recurrent: Secondary | ICD-10-CM | POA: Insufficient documentation

## 2022-01-25 DIAGNOSIS — Z79899 Other long term (current) drug therapy: Secondary | ICD-10-CM | POA: Diagnosis not present

## 2022-01-25 DIAGNOSIS — C7802 Secondary malignant neoplasm of left lung: Secondary | ICD-10-CM | POA: Insufficient documentation

## 2022-01-25 DIAGNOSIS — Z791 Long term (current) use of non-steroidal anti-inflammatories (NSAID): Secondary | ICD-10-CM | POA: Diagnosis not present

## 2022-01-25 NOTE — Progress Notes (Signed)
Left message with patient to introduce myself as prostate nurse navigator.  Patient education appointment on 2/3 and will plan on in person introduction at that time.

## 2022-01-25 NOTE — Progress Notes (Signed)
Pharmacist Chemotherapy Monitoring - Initial Assessment    Anticipated start date: 02/01/22   The following has been reviewed per standard work regarding the patient's treatment regimen: The patient's diagnosis, treatment plan and drug doses, and organ/hematologic function Lab orders and baseline tests specific to treatment regimen  The treatment plan start date, drug sequencing, and pre-medications Prior authorization status  Patient's documented medication list, including drug-drug interaction screen and prescriptions for anti-emetics and supportive care specific to the treatment regimen The drug concentrations, fluid compatibility, administration routes, and timing of the medications to be used The patient's access for treatment and lifetime cumulative dose history, if applicable  The patient's medication allergies and previous infusion related reactions, if applicable   Changes made to treatment plan:  N/A  Follow up needed:  N/A   Acquanetta Belling, RPH, BCPS, BCOP 01/25/2022  1:33 PM

## 2022-01-25 NOTE — Progress Notes (Signed)
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    ICD-10-CM   1. Prostate 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 ([place])  C61    C79.51     2. Hyperlipidemia, unspecified hyperlipidemia type  E78.5     3. Parkinson's disease ([place])  G20       HISTORY OF PRESENT ILLNESS: Frederick Ponce is a 76 y.o. male seen at the request of Dr. Alen Blew.  He had been having low back pain and pelvic pain for 3 to 4 months so he presented to his PCP at [facility] and had an MRI of the lumbar spine on 12/07/2021 which showed sclerotic lesions at S1, S2 and in the left iliac bone, suspicious for metastatic disease.  He was referred to medical oncology and seen by Dede Query, PA-C on 12/27/2021.  The pain in the low back radiated down into the left leg and had remained persistent despite multiple interventions including chiropractic, oral steroids, steroid injections and oral pain medications such as Mobic, Vicodin and ibuprofen.  He denied any paresthesias or focal weakness in the lower extremities and no bowel or bladder issues.  A PSA performed that day was elevated at 22.5.  A CT C/A/P was performed for disease staging and this showed widespread, extensive sclerotic lesions and pulmonary involvement with hilar lymphadenopathy.  A CT biopsy of the left iliac bone was performed on 01/15/2022 and confirmed metastatic prostate cancer, poorly differentiated.  He met with Dr. Alen Blew on 01/23/2022 and the plan is to start Taxotere chemotherapy with ADT and Zytiga/prednisone.  ADT was started with Firmagon  injections at that visit and he is scheduled for chemo education on 01/26/2022.  A prescription was sent for the [facility] but unfortunately, this is outrageously expensive so he has not yet picked up the prescription and is unsure how he will afford it.  The patient has kindly been referred to [place] today to discuss the role of palliative radiotherapy to treat the painful sites of osseous metastatic disease in the pelvis and sacrum.  PREVIOUS RADIATION THERAPY: No  PAST MEDICAL HISTORY:  Past Medical History:  Diagnosis Date   Hyperlipidemia    Parkinson disease ([place])       PAST SURGICAL HISTORY:No past surgical history on file.  FAMILY HISTORY:  Family History  Problem Relation Age of Onset   Tremor Father    Melanoma Sister     SOCIAL HISTORY:  Social History   Socioeconomic History   Marital status: Married    Spouse name: Not on file   Number of children: Not on file   Years of education: Not on file   Highest education level: Not on file  Occupational History   Not on file  Tobacco Use   Smoking status: Never   Smokeless tobacco: Never  Vaping Use   Vaping Use: Never used  Substance and Sexual Activity   Alcohol use: Yes    Alcohol/week: 21.0 standard drinks    Types: 21 Glasses of wine per week   Drug use: Never   Sexual activity: Not on file  Other Topics Concern   Not on file  Social History Narrative   Not on file   Social Determinants of Health   Financial Resource Strain: Not on file  Food Insecurity: Not on file  Transportation Needs: Not on file  Physical Activity: Not on file  Stress: Not on file  Social Connections: Not on file  Intimate Partner Violence: Not on file    ALLERGIES: Patient has no known allergies.  MEDICATIONS:  Current Outpatient Medications  Medication Sig Dispense Refill   ibuprofen (ADVIL) 200 MG tablet 1 tablet with food or milk as needed     atorvastatin (LIPITOR) 40 MG tablet Take 40 mg by mouth daily.     atorvastatin  (LIPITOR) 40 MG tablet Take 1 tablet by mouth at bedtime.     carbidopa-levodopa (SINEMET) 25-100 MG tablet Take 1.5 tablets by mouth daily. 135 tablet 3   co-enzyme Q-10 30 MG capsule Take 30 mg by mouth 3 (three) times daily.     diphenhydrAMINE (BENADRYL) 25 MG tablet Take 1 tablet (25 mg total) by mouth every 6 (six) hours as needed. 30 tablet 0   EPINEPHrine 0.3 mg/0.3 mL IJ SOAJ injection Inject 0.3 mg into the muscle as needed for anaphylaxis. 1 each 0   KRILL OIL PO Take by mouth.     lidocaine (LIDODERM) 5 % Place 1 patch onto the skin daily. Remove & Discard patch within 12 hours or as directed by MD 30 patch 0   morphine (MS CONTIN) 15 MG 12 hr tablet Take 1 tablet (15 mg total) by mouth every 12 (twelve) hours. 60 tablet 0   multivitamin-lutein (OCUVITE-LUTEIN) CAPS capsule Take 1 capsule by mouth daily.     oxyCODONE (OXYCONTIN) 10 mg 12 hr tablet Take 2 tablets (20 mg total) by mouth every 12 (twelve) hours. 60 tablet 0   predniSONE (DELTASONE) 20 MG tablet Take 2 tablets (40 mg total) by mouth daily for 5 days. 10 tablet 0   prochlorperazine (COMPAZINE) 10 MG tablet Take 1 tablet (10 mg total) by mouth every 6 (six) hours as needed for nausea or vomiting. 30 tablet 0   propranolol (INDERAL) 40 MG tablet Take 1 tablet by mouth daily.     vitamin B-12 (CYANOCOBALAMIN) 1000 MCG tablet Take 1,000 mcg by mouth daily.     No current facility-administered medications for this encounter.    REVIEW OF SYSTEMS:  On review of systems, the patient reports that he is doing well overall.  He denies any chest pain, shortness of breath, cough, fevers, chills, night sweats, unintended weight changes.  He denies any bowel or bladder disturbances, and denies abdominal pain, nausea or vomiting.  He denies any new musculoskeletal or joint aches or pains aside from the low back/pelvic pain as outlined above in the HPI. A complete review of systems is obtained and is otherwise negative.    PHYSICAL  EXAM:  Wt Readings from Last 3 Encounters:  01/25/22 172 lb 12.8 oz (78.4 kg)  01/23/22 175 lb (79.4 kg)  01/22/22 174 lb 5 oz (79.1 kg)   Temp Readings from Last 3 Encounters:  01/25/22 97.8 F (36.6 C)  01/23/22 97.6 F (36.4 C) (Temporal)  01/22/22 98.2 F (36.8 C) (Oral)   BP Readings from Last 3 Encounters:  01/25/22 (!) 147/77  01/23/22 136/73  01/22/22 (!) 141/73   Pulse Readings from Last 3 Encounters:  01/25/22 76  01/23/22 88  01/22/22 88   Pain Assessment Pain Score: 6  Pain Loc: Back (lower back to left leg)/10  In general this is a well appearing Caucasian male in no acute distress.  He's alert and oriented x4 and appropriate throughout the examination. Cardiopulmonary assessment is negative for acute distress and he exhibits normal effort.   KPS = 80  100 - Normal; no complaints; no evidence of disease. 90   - Able to carry on normal activity; minor signs or symptoms of disease. 80   - Normal activity with effort; some signs or symptoms of disease. 85   - Cares for self; unable to carry on normal activity or to do active work. 60   - Requires occasional assistance, but is able to care for most of his personal needs. 50   - Requires considerable assistance and frequent medical care. 37   - Disabled; requires special care and assistance. 76   - Severely disabled; hospital admission is indicated although death not imminent. 66   - Very sick; hospital admission necessary; active supportive treatment necessary. 10   - Moribund; fatal processes progressing rapidly. 0     - Dead  Karnofsky DA, Abelmann Sopchoppy, Craver LS and Burchenal JH [phone]) The use of the nitrogen mustards in the palliative treatment of carcinoma: with particular reference to bronchogenic carcinoma Cancer 1 634-56  LABORATORY DATA:  Lab Results  Component Value Date   WBC 6.1 01/20/2022   HGB 13.0 01/20/2022   HCT 40.2 01/20/2022   MCV 103.3 (H) 01/20/2022   PLT 233 01/20/2022   Lab  Results  Component Value Date   NA 137 01/20/2022   K 3.9 01/20/2022   CL 106 01/20/2022   CO2 23 01/20/2022   Lab Results  Component Value Date   ALT 10 01/20/2022   AST 31 01/20/2022   ALKPHOS 239 (H) 01/20/2022   BILITOT 1.0 01/20/2022     RADIOGRAPHY: CT CHEST ABDOMEN PELVIS W CONTRAST  Result Date: 01/05/2022 CLINICAL DATA:  left iliac bone abnormality on outside MRI. Evaluate for metastatic disease and targeted biopsy. Shortness of breath with constipation. Left hip and back pain. EXAM: CT CHEST, ABDOMEN, AND PELVIS WITH CONTRAST TECHNIQUE: Multidetector CT imaging of the chest, abdomen and pelvis was performed following the standard protocol during bolus administration of intravenous contrast. RADIATION DOSE REDUCTION: This exam was performed according to the departmental dose-optimization program which includes automated exposure control, adjustment of the mA and/or kV according to patient size and/or use of iterative reconstruction technique. CONTRAST:  154m OMNIPAQUE IOHEXOL 300 MG/ML  SOLN COMPARISON:  Lumbar spine MRI 12/07/2021. FINDINGS: CT CHEST FINDINGS Cardiovascular: Aortic atherosclerosis. Tortuous thoracic aorta. Mild cardiomegaly, without pericardial effusion. Lad coronary artery calcification. No central pulmonary embolism, on this non-dedicated study. Mediastinum/Nodes: No supraclavicular adenopathy. No axillary adenopathy. Right hilar adenopathy at 2.3 x 2.0 cm on 25/2. No mediastinal adenopathy. Lungs/Pleura: No pleural fluid. Innumerable bilateral pulmonary nodules. Index right upper lobe 11 mm nodule on 40/4. A left upper lobe pulmonary nodule of 1.2 cm on 49/4. Left lower lobe pulmonary nodule of 1.7 x 1.4 cm on 96/4. Musculoskeletal: No acute osseous abnormality. CT ABDOMEN PELVIS FINDINGS Hepatobiliary: High right hepatic lobe 1.0 cm hypoattenuating lesion on 46/2 is favored to represent hemangioma, including peripheral nodular enhancement on 47/2. More caudal right  hepatic lobe lesion is too small to characterize at approximately 4 mm. Normal gallbladder, without biliary ductal dilatation. Pancreas: Normal, without mass or ductal dilatation. Spleen: Normal in size, without focal abnormality. Adrenals/Urinary Tract: Normal adrenal glands. Bilateral renal collecting system  calculi, including a dominant stone within the right renal pelvis of 12 mm. Moderate right-sided caliectasis without hydroureter. Normal urinary bladder. Stomach/Bowel: Normal stomach, without wall thickening. Nonobstructive sigmoid colon positioned within a moderate right inguinal hernia including on 115/2. No findings of strangulation. Normal terminal ileum and appendix. Normal small bowel. Vascular/Lymphatic: Aortic atherosclerosis. No abdominopelvic adenopathy. Reproductive: Moderate prostatomegaly with subtle nodule or node just posterior to the prostatic base at 5 mm on 102/2. Heterogeneous prostatic enhancement with subtle ill definition of prostatic capsule, including right of midline on 104/2. Other: No significant free fluid. No evidence of omental or peritoneal disease. Musculoskeletal: Left sacral, iliac, and parasymphyseal bilateral pubic relatively diffuse sclerosis. IMPRESSION: CT CHEST IMPRESSION 1. Widespread pulmonary metastasis. 2. Right hilar adenopathy, likely representing nodal metastasis. 3. No primary malignancy identified within the chest. 4. Coronary artery atherosclerosis. Aortic Atherosclerosis (ICD10-I70.0). CT ABDOMEN AND PELVIS IMPRESSION 1. Extensive pelvic sclerotic osseous metastasis. 2. Prostatomegaly with ill definition of prostatic capsule and subtle periprosthetic nodule or node. Suspect primary prostate carcinoma. Correlate with physical exam and PSA. The patient's nodal and pulmonary metastasis may represent an aggressive appearance of prostate primary or a synchronous second primary. PMSA PET may be informative. 3. Bilateral nephrolithiasis with right-sided caliectasis  but no hydroureter. Favor chronic ureteropelvic junction obstruction. 4. Nonobstructive sigmoid colon containing right inguinal hernia. Electronically Signed   By: Abigail Miyamoto M.D.   On: 01/05/2022 14:09   CT BONE TROCAR/NEEDLE BIOPSY DEEP  Result Date: 01/15/2022 INDICATION: biopsy of left iliac bone. abnormal MRI finding with pain. EXAM: CT BIOPSY CORE BONE DEEP MEDICATIONS: None. ANESTHESIA/SEDATION: Moderate (conscious) sedation was employed during this procedure. A total of Versed 2.5 mg and Fentanyl 175 mcg was administered intravenously. Moderate Sedation Time: 29 minutes. The patient's level of consciousness and vital signs were monitored continuously by radiology nursing throughout the procedure under my direct supervision. FLUOROSCOPY TIME:  N/a COMPLICATIONS: None immediate. PROCEDURE: Informed written consent was obtained from the patient after a thorough discussion of the procedural risks, benefits and alternatives. All questions were addressed. Maximal Sterile Barrier Technique was utilized including caps, mask, sterile gowns, sterile gloves, sterile drape, hand hygiene and skin antiseptic. A timeout was performed prior to the initiation of the procedure. The patient was placed prone on the CT exam table. Limited CT of the pelvis was performed for planning purposes. CT again demonstrated diffuse sclerotic appearance of the left posterior ilium. Skin entry site was marked, and the overlying skin was prepped and draped in the standard sterile fashion. Local analgesia was obtained with 1% lidocaine. Using CT guidance, a 10 gauge bone introducer needle was advanced just deep to the cortex of the left posterior ilium. Subsequently, core needle biopsy was performed using 10 gauge core biopsy device x2 passes. Specimens were submitted to pathology for handling. Hemostasis was achieved with manual pressure, and a clean dressing was placed. The patient tolerated the procedure well without immediate  complication. IMPRESSION: Successful CT-guided core biopsy of sclerotic lesion in the left posterior ilium. Electronically Signed   By: Albin Felling M.D.   On: 01/15/2022 09:56      IMPRESSION/PLAN: 1. 76 y.o. 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  Today, we talked to the patient and his partner about the findings and workup thus far. We discussed the natural history of metastatic adenocarcinoma of the prostate and general treatment, highlighting the role of  radiotherapy in the management. We discussed the available radiation techniques, and focused on the details and logistics of delivery.  The recommendation is to proceed with a 2-week course of daily, palliative radiotherapy to the left iliac and sacral lesions.  We reviewed the anticipated acute and late sequelae associated with radiation in this setting. The patient was encouraged to ask questions that were answered to his stated satisfaction.  He appears to have a good understanding of his disease and our treatment recommendations which are of palliative intent and he is in agreement to proceed with the recommended course of palliative radiation.  He has freely signed written consent to proceed today in the office and a copy of this document will be placed in his medical record.  We will share our discussion with Dr. Alen Blew and coordinate for CT simulation on Monday, 01/29/2022, in anticipation of beginning his daily radiation treatments on Wednesday, 01/31/2022.    We personally spent 70 minutes in this encounter including chart review, reviewing radiological studies, meeting face-to-face with the patient, entering orders and completing documentation.    Nicholos Johns, PA-C    Tyler Pita, MD  [facility] Direct Dial: [phone]   Fax: [phone] [email or web address]   Skype   [email or web address]

## 2022-01-25 NOTE — Telephone Encounter (Signed)
Scheduled per los, patient has been called and voicemail was left regarding upcoming appointments.  

## 2022-01-25 NOTE — Progress Notes (Signed)
Spoke with patient and introduced myself as prostate nurse navigator, and explained my role.    Patient voiced being slightly overwhelmed with appointments, RN reviewed in detail upcoming appointments.   Pt very thankful, I informed patient I would meet him for a face to face introduction during his appointment 2/3, and we plan to further discuss navigation needs.

## 2022-01-26 ENCOUNTER — Inpatient Hospital Stay: Payer: Medicare Other | Attending: Nurse Practitioner

## 2022-01-28 NOTE — Progress Notes (Signed)
°  Radiation Oncology         [phone] ________________________________  Name: Frederick Ponce MRN: [id number]  Date: 01/29/2022  DOB: Sep 02, 1946  SIMULATION AND TREATMENT PLANNING NOTE    ICD-10-CM   1. Prostate 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 [facility])  C61    C79.51       DIAGNOSIS:  76 yo man with newly diagnosed widely metastatic prostate cancer involving the left sacroiliac pelvis bones  NARRATIVE:  The patient was brought to the [facility].  Identity was confirmed.  All relevant records and images related to the planned course of therapy were reviewed.  The patient freely provided informed written consent to proceed with treatment after reviewing the details related to the planned course of therapy. The consent form was witnessed and verified by the simulation staff.  Then, the patient was set-up in a stable reproducible  supine position for radiation therapy.  CT images were obtained.  Surface markings were placed.  The CT images were loaded into the planning software.  Then the target and avoidance structures were contoured.  Treatment planning then occurred.  The radiation prescription was entered and confirmed.  Then, I designed and supervised the construction of a total of at least 3 medically necessary complex treatment devices consisting of leg positioner and MLC apertures to cover the treated hip area.  I have requested : 3D Simulation  I have requested a DVH of the following structures: Rectum, Bladder, femoral heads and target.  PLAN:  The patient will receive 30 Gy in 10 fractions.  ________________________________  Sheral Apley Tammi Klippel, M.D.

## 2022-01-29 ENCOUNTER — Telehealth: Payer: Self-pay | Admitting: Neurology

## 2022-01-29 ENCOUNTER — Other Ambulatory Visit: Payer: Self-pay

## 2022-01-29 ENCOUNTER — Ambulatory Visit
Admission: RE | Admit: 2022-01-29 | Discharge: 2022-01-29 | Disposition: A | Discharge status: Home / Self Care | Payer: Medicare Other | Source: Ambulatory Visit | Attending: Radiation Oncology | Admitting: Radiation Oncology

## 2022-01-29 DIAGNOSIS — Z510 Encounter for antineoplastic radiation therapy: Secondary | ICD-10-CM | POA: Diagnosis not present

## 2022-01-29 DIAGNOSIS — C7951 Secondary malignant neoplasm of bone: Secondary | ICD-10-CM | POA: Insufficient documentation

## 2022-01-29 DIAGNOSIS — C61 Malignant neoplasm of prostate: Secondary | ICD-10-CM | POA: Diagnosis present

## 2022-01-29 NOTE — Telephone Encounter (Signed)
I am not aware of any interaction between Parkinson's medication and radiation treatment.  I am not sure what chemotherapy he will be taking but generally speaking, his oncologist or his pharmacist may be able to look up any interactions with the Parkinson's medications he is on.  Please relay back to patient.

## 2022-01-29 NOTE — Telephone Encounter (Signed)
Pt is beginning radiation treatment(afterwards he will begin Chemo) on 02-08, he is asking if there is any concerns re: current medications he takes for his Parkinson's

## 2022-01-29 NOTE — Telephone Encounter (Signed)
Called pt & LVM asking for call back.  

## 2022-01-29 NOTE — Telephone Encounter (Signed)
Pt returned my call. I passed the message along to patient from Dr Rexene Alberts. He verbalized understanding. He said the radiation is due to start on 02/14/22. He will be seeing Dr Rexene Alberts for a follow-up on 02/08/22. He verbalized appreciation for the call. He also stated his pain that he thought was sciatica back in November ended up being prostate cancer. It has metastasized to the lungs.

## 2022-01-30 DIAGNOSIS — Z510 Encounter for antineoplastic radiation therapy: Secondary | ICD-10-CM | POA: Diagnosis not present

## 2022-01-31 ENCOUNTER — Other Ambulatory Visit: Payer: Self-pay

## 2022-01-31 ENCOUNTER — Ambulatory Visit
Admission: RE | Admit: 2022-01-31 | Discharge: 2022-01-31 | Disposition: A | Discharge status: Home / Self Care | Payer: Medicare Other | Source: Ambulatory Visit | Attending: Radiation Oncology | Admitting: Radiation Oncology

## 2022-01-31 DIAGNOSIS — Z510 Encounter for antineoplastic radiation therapy: Secondary | ICD-10-CM | POA: Diagnosis not present

## 2022-02-01 ENCOUNTER — Ambulatory Visit
Admission: RE | Admit: 2022-02-01 | Discharge: 2022-02-01 | Disposition: A | Discharge status: Home / Self Care | Payer: Medicare Other | Source: Ambulatory Visit | Attending: Radiation Oncology | Admitting: Radiation Oncology

## 2022-02-01 ENCOUNTER — Inpatient Hospital Stay: Payer: Medicare Other

## 2022-02-01 DIAGNOSIS — Z510 Encounter for antineoplastic radiation therapy: Secondary | ICD-10-CM | POA: Diagnosis not present

## 2022-02-02 ENCOUNTER — Ambulatory Visit
Admission: RE | Admit: 2022-02-02 | Discharge: 2022-02-02 | Disposition: A | Discharge status: Home / Self Care | Payer: Medicare Other | Source: Ambulatory Visit | Attending: Radiation Oncology | Admitting: Radiation Oncology

## 2022-02-02 ENCOUNTER — Other Ambulatory Visit: Payer: Self-pay

## 2022-02-02 DIAGNOSIS — Z510 Encounter for antineoplastic radiation therapy: Secondary | ICD-10-CM | POA: Diagnosis not present

## 2022-02-03 ENCOUNTER — Ambulatory Visit: Payer: Medicare Other

## 2022-02-05 ENCOUNTER — Ambulatory Visit
Admission: RE | Admit: 2022-02-05 | Discharge: 2022-02-05 | Disposition: A | Discharge status: Home / Self Care | Payer: Medicare Other | Source: Ambulatory Visit | Attending: Radiation Oncology | Admitting: Radiation Oncology

## 2022-02-05 ENCOUNTER — Inpatient Hospital Stay: Payer: Medicare Other | Admitting: General Practice

## 2022-02-05 ENCOUNTER — Encounter: Payer: Self-pay | Admitting: General Practice

## 2022-02-05 ENCOUNTER — Other Ambulatory Visit: Payer: Self-pay

## 2022-02-05 DIAGNOSIS — Z510 Encounter for antineoplastic radiation therapy: Secondary | ICD-10-CM | POA: Diagnosis not present

## 2022-02-05 NOTE — Progress Notes (Signed)
[place] Spiritual Care Note  Met with Mr Chevez in [facility] to review his advance care planning documents. He determined that he would like to add more specific information and special instructions and so has scheduled himself for a follow-up [facility] appointment on 02/16/2022.    [place], [place], [facility] Pager [phone] Voicemail [phone]

## 2022-02-06 ENCOUNTER — Telehealth: Payer: Self-pay | Admitting: Nurse Practitioner

## 2022-02-06 ENCOUNTER — Ambulatory Visit
Admission: RE | Admit: 2022-02-06 | Discharge: 2022-02-06 | Disposition: A | Discharge status: Home / Self Care | Payer: Medicare Other | Source: Ambulatory Visit | Attending: Radiation Oncology | Admitting: Radiation Oncology

## 2022-02-06 DIAGNOSIS — Z510 Encounter for antineoplastic radiation therapy: Secondary | ICD-10-CM | POA: Diagnosis not present

## 2022-02-06 NOTE — Progress Notes (Signed)
[place] Spiritual Care Note  Inadvertently created a second note for this encounter. Please see same-day note.   [place], [place], [facility] Pager [phone] Voicemail [phone]

## 2022-02-06 NOTE — Telephone Encounter (Signed)
Scheduled per 2/14 secure chat, pt has been called and confirmed

## 2022-02-07 ENCOUNTER — Ambulatory Visit
Admission: RE | Admit: 2022-02-07 | Discharge: 2022-02-07 | Disposition: A | Discharge status: Home / Self Care | Payer: Medicare Other | Source: Ambulatory Visit | Attending: Radiation Oncology | Admitting: Radiation Oncology

## 2022-02-07 ENCOUNTER — Other Ambulatory Visit: Payer: Self-pay

## 2022-02-07 DIAGNOSIS — Z510 Encounter for antineoplastic radiation therapy: Secondary | ICD-10-CM | POA: Diagnosis not present

## 2022-02-08 ENCOUNTER — Ambulatory Visit
Admission: RE | Admit: 2022-02-08 | Discharge: 2022-02-08 | Disposition: A | Discharge status: Home / Self Care | Payer: Medicare Other | Source: Ambulatory Visit | Attending: Radiation Oncology | Admitting: Radiation Oncology

## 2022-02-08 ENCOUNTER — Ambulatory Visit: Payer: Medicare Other | Admitting: Neurology

## 2022-02-08 DIAGNOSIS — Z510 Encounter for antineoplastic radiation therapy: Secondary | ICD-10-CM | POA: Diagnosis not present

## 2022-02-09 ENCOUNTER — Other Ambulatory Visit: Payer: Self-pay

## 2022-02-09 ENCOUNTER — Ambulatory Visit
Admission: RE | Admit: 2022-02-09 | Discharge: 2022-02-09 | Disposition: A | Discharge status: Home / Self Care | Payer: Medicare Other | Source: Ambulatory Visit | Attending: Radiation Oncology | Admitting: Radiation Oncology

## 2022-02-09 DIAGNOSIS — Z510 Encounter for antineoplastic radiation therapy: Secondary | ICD-10-CM | POA: Diagnosis not present

## 2022-02-12 ENCOUNTER — Encounter: Payer: Self-pay | Admitting: Physician Assistant

## 2022-02-12 ENCOUNTER — Other Ambulatory Visit: Payer: Self-pay

## 2022-02-12 ENCOUNTER — Encounter: Payer: Self-pay | Admitting: Oncology

## 2022-02-12 ENCOUNTER — Ambulatory Visit
Admission: RE | Admit: 2022-02-12 | Discharge: 2022-02-12 | Disposition: A | Discharge status: Home / Self Care | Payer: Medicare Other | Source: Ambulatory Visit | Attending: Radiation Oncology | Admitting: Radiation Oncology

## 2022-02-12 DIAGNOSIS — Z510 Encounter for antineoplastic radiation therapy: Secondary | ICD-10-CM | POA: Diagnosis not present

## 2022-02-13 ENCOUNTER — Inpatient Hospital Stay (HOSPITAL_BASED_OUTPATIENT_CLINIC_OR_DEPARTMENT_OTHER): Payer: Medicare Other | Admitting: Nurse Practitioner

## 2022-02-13 ENCOUNTER — Encounter: Payer: Self-pay | Admitting: Urology

## 2022-02-13 ENCOUNTER — Encounter: Payer: Self-pay | Admitting: Nurse Practitioner

## 2022-02-13 ENCOUNTER — Encounter: Payer: Self-pay | Admitting: Licensed Clinical Social Worker

## 2022-02-13 ENCOUNTER — Ambulatory Visit
Admission: RE | Admit: 2022-02-13 | Discharge: 2022-02-13 | Disposition: A | Discharge status: Home / Self Care | Payer: Medicare Other | Source: Ambulatory Visit | Attending: Radiation Oncology | Admitting: Radiation Oncology

## 2022-02-13 VITALS — BP 145/80 | HR 90 | Temp 97.0°F | Resp 17 | Wt 170.1 lb

## 2022-02-13 DIAGNOSIS — Z7189 Other specified counseling: Secondary | ICD-10-CM | POA: Diagnosis not present

## 2022-02-13 DIAGNOSIS — C61 Malignant neoplasm of prostate: Secondary | ICD-10-CM | POA: Diagnosis not present

## 2022-02-13 DIAGNOSIS — Z515 Encounter for palliative care: Secondary | ICD-10-CM

## 2022-02-13 DIAGNOSIS — C7951 Secondary malignant neoplasm of bone: Secondary | ICD-10-CM

## 2022-02-13 DIAGNOSIS — R530 Neoplastic (malignant) related fatigue: Secondary | ICD-10-CM | POA: Diagnosis not present

## 2022-02-13 DIAGNOSIS — Z510 Encounter for antineoplastic radiation therapy: Secondary | ICD-10-CM | POA: Diagnosis not present

## 2022-02-13 NOTE — Progress Notes (Signed)
[facility]  [phone]) [phone] [phone]) [phone]   Name: Frederick Ponce Date: 02/13/2022 MRN: [id number]  DOB: Jan 14, 1946  Patient Care Team: [facility], [facility] as PCP - General    REASON FOR CONSULTATION: Frederick Ponce is a 76 y.o. male with medical history including Parkinson's disease and hyperlipidemia. 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 He is having urgent radiation appointment due to iliac bone pain. Palliative ask to see for symptom management.    SOCIAL HISTORY:     reports that he has never smoked. He has never used smokeless tobacco. He reports current alcohol use of about 21.0 standard drinks per week. He reports that he does not use drugs.  ADVANCE DIRECTIVES:  Patient is scheduled to complete advanced directives at our clinic here at the cancer center on 02/16/2022. MOST form provided.  Patient plans to review and complete at a later time.   CODE STATUS:   PAST MEDICAL HISTORY: Past Medical History:  Diagnosis Date   Hyperlipidemia    Parkinson disease ([place])     PAST SURGICAL HISTORY: History reviewed. No pertinent surgical history.  HEMATOLOGY/ONCOLOGY HISTORY:  Oncology History  Prostate 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 ([place])  01/18/2022 Initial Diagnosis   Prostate 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 ([place])   02/20/2022 -  Chemotherapy   Patient is on Treatment Plan : PROSTATE Docetaxel q21d       ALLERGIES:  has No Known Allergies.  MEDICATIONS:  Current Outpatient Medications  Medication Sig Dispense Refill   atorvastatin (LIPITOR) 40 MG tablet Take 40 mg by mouth daily.     atorvastatin (LIPITOR) 40 MG tablet Take 1 tablet by mouth at bedtime.     carbidopa-levodopa (SINEMET) 25-100 MG tablet Take 1.5 tablets by mouth daily. 135 tablet 3   co-enzyme Q-10 30 MG capsule Take 30 mg by mouth 3  (three) times daily.     diphenhydrAMINE (BENADRYL) 25 MG tablet Take 1 tablet (25 mg total) by mouth every 6 (six) hours as needed. 30 tablet 0   EPINEPHrine 0.3 mg/0.3 mL IJ SOAJ injection Inject 0.3 mg into the muscle as needed for anaphylaxis. 1 each 0   ibuprofen (ADVIL) 200 MG tablet 1 tablet with food or milk as needed     KRILL OIL PO Take by mouth.     lidocaine (LIDODERM) 5 % Place 1 patch onto the skin daily. Remove & Discard patch within 12 hours or as directed by MD 30 patch 0   morphine (MS CONTIN) 15 MG 12 hr tablet Take 1 tablet (15 mg total) by mouth every 12 (twelve) hours. 60 tablet 0   multivitamin-lutein (OCUVITE-LUTEIN) CAPS capsule Take 1 capsule by mouth daily.     oxyCODONE (OXYCONTIN) 10 mg 12 hr tablet Take 2 tablets (20 mg total) by mouth every 12 (twelve) hours. 60 tablet 0   prochlorperazine (COMPAZINE) 10 MG tablet Take 1 tablet (10 mg total) by mouth every 6 (six) hours as needed for nausea or vomiting. 30 tablet 0   propranolol (INDERAL) 40 MG tablet Take 1 tablet by mouth daily.     vitamin B-12 (CYANOCOBALAMIN) 1000 MCG tablet Take 1,000 mcg by mouth daily.     No current facility-administered medications for this visit.    VITAL SIGNS: BP (!) 145/80 (BP Location: Left Arm, Patient Position: Sitting)    Pulse 90    Temp [name])  97 F (36.1 C) (Oral)    Resp 17    Wt 170 lb 1 oz (77.1 kg)    SpO2 96%    BMI 25.86 kg/m  Filed Weights   02/13/22 1529  Weight: 170 lb 1 oz (77.1 kg)    Estimated body mass index is 25.86 kg/m as calculated from the following:   Height as of 01/25/22: 5\' 8"  (1.727 m).   Weight as of this encounter: 170 lb 1 oz (77.1 kg).   PERFORMANCE STATUS (ECOG) : 1 - Symptomatic but completely ambulatory   Physical Exam General: NAD Pulmonary: Normal breathing pattern  Abdomen: soft, nontender, + bowel sounds Extremities: no edema, no joint deformities Neurological: AAO x3,   IMPRESSION:  Frederick Ponce presents to the clinic today with  his husband for follow-up.  He is feeling much better and is able to do more around the house due to significant decrease in pain.  He completed his last radiation treatment on today.  Tolerated all treatments well.  He is scheduled to start chemotherapy on next week however expresses some anxiety about because and expectations.  He and Frederick Ponce share that would like to speak to Dr. Alen Blew prior to proceeding with infusion to make sure they are making a well-informed decision in regards to his treatment and quality of life.  He is asking about prognosis with and without treatment, quality of life/symptom burden with treatment, expectations to be expected with treatment and how it relates to his cancer.  Patient and husband aware I will defer all questions to his oncologist who can best answer these questions.    Left Iliac bone pain  Frederick Ponce's pain has significantly improved.  He attributes this to a positive response from radiation treatment.  We discussed this is definitely a win and he is very much appreciative of his improved quality of life.  He does continue to endorse some fatigue but again is thankful he is not in significant pain.  He has been able to wean off of all pain medication at this time.  Understands a plan to continue to closely monitor.     Constipation Much improved.  Is only taking prune juice at this time.  3.  Goals of care Frederick Ponce and Frederick Ponce expressed his appreciation and his good quality of life at this time.  He does endorse fatigue however this is manageable.  He reports he is not afraid to face end-of-life however also does not want to face prematurely.  He has many questions regarding his treatment options, expectations, prognosis, and how this will affect his quality of life moving forward.  They have been working on his advanced directives.  He has upcoming appointment on 02/16/2022 at the advanced directive clinic to complete documents.  MOST form has been provided along with  education.  He would like to take form home and further review with plans to complete when he comes in on Friday.  He understands to let the team know when he completes his advanced directive that he would like the documents completed and I will be happy to review and sign off.   PLAN: Pain is much improved.  He has been able to wean off of all pain medication.   Tylenol extra strength 3 times a day as needed Lidoderm patch as needed  May continue prune juice for bowel regimen. Patient will plan to follow-up with Dr. Alen Blew in regards to ongoing questions regarding upcoming treatment.  We will see patient back in 2-3  weeks in collaboration to other oncology appointments.   Patient expressed understanding and was in agreement with this plan. He also understands that He can call the clinic at any time with any questions, concerns, or complaints.   Time Total: 40 min.  Visit consisted of counseling and education dealing with the complex and emotionally intense issues of symptom management and palliative care in the setting of serious and potentially life-threatening illness.Greater than 50%  of this time was spent counseling and coordinating care related to the above assessment and plan.  Alda Lea, AGPCNP-BC  [place]

## 2022-02-13 NOTE — Progress Notes (Signed)
[place] Work  Holiday representative  received VM from pt on 02/12/2022.   CSW returned call today and pt relayed that he is concerned about the scheduled infusion treatments and how that might impact his quality of life. He is very independent and does not like to rely on others for assistance. Now that his pain is decreased after the radiation treatments, he wants to explore more of what the various treatment options are and how they will impact his quality and independence.   Pt has left a message for Dr. Alen Blew APP. He is also seeing Nikki with palliative care today. CSW encouraged pt to speak with NP and MD regarding the specifics of the treatment options and side effects. CSW will see pt on 2/24 for advanced directives clinic and can explore further if pt wants to set up visits with CSW or spiritual care for further counseling.     [place], [place] Worker [employer]

## 2022-02-13 NOTE — Progress Notes (Signed)
Frederick Ponce visited our office today and had questions regarding his prognosis and treatment options. He is scheduled to start infusion treatments next week, but is wanting to speak to Dr. Alen Blew before proceeding. Dr. Alen Blew notified.

## 2022-02-14 ENCOUNTER — Telehealth: Payer: Self-pay

## 2022-02-14 ENCOUNTER — Telehealth: Payer: Self-pay | Admitting: Nurse Practitioner

## 2022-02-14 NOTE — Telephone Encounter (Signed)
Scheduled per 2/21 los, message has been left with pt °

## 2022-02-14 NOTE — Telephone Encounter (Signed)
Mr. Forehand spouse, Frederick Ponce, called this morning to let our office know that Frederick Ponce has been having diarrhea with incontinence. He stated that they had tried [employer] but that this wasn't working. Advised patient to try Immodium OTC to see if this helps and to call us back with any questions/concerns. All questions answered. Understanding verbalized.

## 2022-02-16 ENCOUNTER — Telehealth: Payer: Self-pay | Admitting: Oncology

## 2022-02-16 ENCOUNTER — Other Ambulatory Visit: Payer: Self-pay

## 2022-02-16 ENCOUNTER — Inpatient Hospital Stay: Payer: Medicare Other | Admitting: Licensed Clinical Social Worker

## 2022-02-16 DIAGNOSIS — C61 Malignant neoplasm of prostate: Secondary | ICD-10-CM

## 2022-02-16 DIAGNOSIS — C7951 Secondary malignant neoplasm of bone: Secondary | ICD-10-CM

## 2022-02-16 NOTE — Progress Notes (Signed)
[place] Social Work  Patient presented to [facility]  to review and complete healthcare advance directives.  Clinical Social Worker met with patient and pt's spouse.  The patient designated Janee Morn as their primary healthcare agent and Omar Person as their secondary agent.  Patient also completed healthcare living will.    Documents were notarized and copies made for patient/family. Clinical Social Worker will send documents to medical records to be scanned into patient's chart. Clinical Social Worker encouraged patient/family to contact with any additional questions or concerns.   [place], [place] Worker [employer]

## 2022-02-16 NOTE — Telephone Encounter (Signed)
.  Called patient to schedule appointment per 2/24 inbasket, patient is aware of date and time.

## 2022-02-19 MED FILL — Dexamethasone Sodium Phosphate Inj 100 MG/10ML: INTRAMUSCULAR | Qty: 1 | Status: AC

## 2022-02-20 ENCOUNTER — Encounter: Payer: Self-pay | Admitting: Physician Assistant

## 2022-02-20 ENCOUNTER — Inpatient Hospital Stay: Payer: Medicare Other

## 2022-02-20 ENCOUNTER — Other Ambulatory Visit: Payer: Self-pay

## 2022-02-20 ENCOUNTER — Encounter: Payer: Self-pay | Admitting: Oncology

## 2022-02-20 ENCOUNTER — Ambulatory Visit: Payer: Medicare Other

## 2022-02-20 VITALS — BP 126/76 | HR 80 | Temp 98.6°F | Resp 18

## 2022-02-20 DIAGNOSIS — C7951 Secondary malignant neoplasm of bone: Secondary | ICD-10-CM | POA: Insufficient documentation

## 2022-02-20 DIAGNOSIS — Z5111 Encounter for antineoplastic chemotherapy: Secondary | ICD-10-CM | POA: Insufficient documentation

## 2022-02-20 DIAGNOSIS — C61 Malignant neoplasm of prostate: Secondary | ICD-10-CM

## 2022-02-20 MED ORDER — DEGARELIX ACETATE 80 MG ~~LOC~~ SOLR
80.0000 mg | Freq: Once | SUBCUTANEOUS | Status: AC
  Administered 2022-02-20: 80 mg via SUBCUTANEOUS
  Filled 2022-02-20: qty 4

## 2022-02-20 NOTE — Patient Instructions (Signed)
Degarelix injection What is this medication? DEGARELIX (deg a REL ix) is used to treat men with advanced prostate cancer. This medicine may be used for other purposes; ask your health care provider or pharmacist if you have questions. COMMON BRAND NAME(S): Degarelix, Mills Koller What should I tell my care team before I take this medication? They need to know if you have any of these conditions: diabetes heart disease kidney disease liver disease low levels of potassium or magnesium in the blood osteoporosis an unusual or allergic reaction to degarelix, mannitol, other medicines, foods, dyes, or preservatives pregnant or trying to get pregnant breast-feeding How should I use this medication? This medicine is for injection under the skin. It is usually given by a health care professional in a hospital or clinic setting. If you get this medicine at home, you will be taught how to prepare and give this medicine. Use exactly as directed. Take your medicine at regular intervals. Do not take it more often than directed. It is important that you put your used needles and syringes in a special sharps container. Do not put them in a trash can. If you do not have a sharps container, call your pharmacist or healthcare provider to get one. Talk to your pediatrician regarding the use of this medicine in children. Special care may be needed. Overdosage: If you think you have taken too much of this medicine contact a poison control center or emergency room at once. NOTE: This medicine is only for you. Do not share this medicine with others. What if I miss a dose? Try not to miss a dose. If you do miss a dose, call your doctor or health care professional for advice. What may interact with this medication? Do not take this medicine with any of the following medications: cisapride dronedarone pimozide thioridazine This medicine may also interact with the following medications: other medicines that prolong  the QT interval (abnormal heart rhythm) This list may not describe all possible interactions. Give your health care provider a list of all the medicines, herbs, non-prescription drugs, or dietary supplements you use. Also tell them if you smoke, drink alcohol, or use illegal drugs. Some items may interact with your medicine. What should I watch for while using this medication? Visit your doctor or health care professional for regular checks on your progress and discuss any issues before you start taking this medicine. Do not rub or scratch injection site. There may be a lump at the injection site, or it may be red or sore for a few days after your dose. Your doctor or health care professional will need to monitor your hormone levels in your blood to check your response to treatment. Try to keep any appointments for testing. What side effects may I notice from receiving this medication? Side effects that you should report to your doctor or health care professional as soon as possible: allergic reactions like skin rash, itching or hives, swelling of the face, lips, or tongue fever or chills irregular heartbeat nausea and vomiting along with severe abdominal pain pain or difficulty passing urine pelvic pain or bloating signs and symptoms of high blood sugar such as being more thirsty or hungry or having to urinate more than normal. You may also feel very tired or have blurry vision Side effects that usually do not require medical attention (report to your doctor or health care professional if they continue or are bothersome): change in sex drive or performance constipation headache high blood pressure hot flashes (  flushing of skin, increased sweating) itching, redness or mild pain at site where injected joint pain trouble sleeping unusually weak or tired weight gain This list may not describe all possible side effects. Call your doctor for medical advice about side effects. You may report side  effects to FDA at 1-800-FDA-1088. Where should I keep my medication? Keep out of the reach of children. This drug is usually given in a hospital or clinic and will not be stored at home. In rare cases, this medicine may be given at home. If you are using this medicine at home, you will be instructed on how to store this medicine. Throw away any unused medicine after the expiration date on the label. NOTE: This sheet is a summary. It may not cover all possible information. If you have questions about this medicine, talk to your doctor, pharmacist, or health care provider.  2022 Elsevier/Gold Standard (2021-08-29 00:00:00)

## 2022-02-20 NOTE — Addendum Note (Signed)
Addended by: Tora Kindred on: 02/20/2022 04:28 PM   Modules accepted: Orders

## 2022-02-22 ENCOUNTER — Ambulatory Visit: Payer: Medicare Other

## 2022-02-22 ENCOUNTER — Other Ambulatory Visit: Payer: Self-pay | Admitting: Oncology

## 2022-02-22 ENCOUNTER — Other Ambulatory Visit (HOSPITAL_COMMUNITY): Payer: Self-pay

## 2022-02-22 ENCOUNTER — Inpatient Hospital Stay: Payer: Medicare Other

## 2022-02-22 ENCOUNTER — Ambulatory Visit: Payer: Medicare Other | Admitting: Oncology

## 2022-02-22 ENCOUNTER — Other Ambulatory Visit: Payer: Medicare Other

## 2022-02-22 ENCOUNTER — Telehealth: Payer: Self-pay

## 2022-02-22 DIAGNOSIS — C61 Malignant neoplasm of prostate: Secondary | ICD-10-CM

## 2022-02-22 MED ORDER — PREDNISONE 5 MG PO TABS: 5.0000 mg | ORAL_TABLET | Freq: Every day | ORAL | 1 refills | Status: DC

## 2022-02-22 MED ORDER — ABIRATERONE ACETATE 250 MG PO TABS
1000.0000 mg | ORAL_TABLET | Freq: Every day | ORAL | 0 refills | Status: DC
  Filled 2022-02-22: qty 120, 30d supply, fill #0

## 2022-02-23 ENCOUNTER — Other Ambulatory Visit (HOSPITAL_COMMUNITY): Payer: Self-pay

## 2022-02-23 MED ORDER — ABIRATERONE ACETATE 250 MG PO TABS: 1000.0000 mg | ORAL_TABLET | Freq: Every day | ORAL | 0 refills | Status: DC

## 2022-02-23 NOTE — Telephone Encounter (Signed)
Oral Oncology Pharmacist Encounter ? ?Received new prescription for abiraterone (Zytiga) for the treatment of castration-sensitive advanced prostate cancer in conjunction with prednisone, planned duration until disease progression or unacceptable toxicity. ? ?Labs from 01/20/2022 assessed, no interventions needed. ? ?Current medication list in Epic reviewed, DDIs with zytiga identified: ?- atorvastatin: may enhance rhabdomyolysis effect ?- propranolol: monitor for increased propranolol effects and toxicities. Will discuss with patient to monitor. ? ?Evaluated chart and no patient barriers to medication adherence noted.  ? ?Patient agreement for treatment documented in MD note on 01/23/2022. ? ?Prescription has been e-scribed to the [facility] for benefits analysis and approval. ? ?Oral Oncology Clinic will continue to follow for insurance authorization, copayment issues, initial counseling and start date. ? ?Drema Halon, PharmD ?Hematology/Oncology Clinical Pharmacist ?[facility] ?[phone] ?02/23/2022 8:45 AM ? ? ?

## 2022-02-24 ENCOUNTER — Ambulatory Visit: Payer: Medicare Other

## 2022-02-27 NOTE — Telephone Encounter (Signed)
Oral Chemotherapy Pharmacist Encounter ? ?I spoke with patient for overview of: Zytiga for the treatment of advanced, castration-positive prostate cancer in conjunction with prednisone, planned duration until disease progression or unacceptable toxicity.  ? ?Counseled patient on administration, dosing, side effects, monitoring, drug-food interactions, safe handling, storage, and disposal. ? ?Patient will take Zytiga '250mg'$  tablets, 4 tablets ('1000mg'$ ) by mouth once daily on an empty stomach, 1 hour before or 2 hours after a meal. ? ?Patient states he will take his Zytiga in the afternoon and will wait at least 1 hour before eating. ? ?Patient will take prednisone '5mg'$  tablet, 1 tablet by mouth one daily with breakfast. ? ?Zytiga start date: 03/06/2022 ? ?Patient is no longer on propranolol medication.  ? ?Adverse effects include but are not limited to: peripheral edema, GI upset, hypertension, hot flashes, fatigue, and arthralgias.   ? ?Prednisone prescription has  been sent to [facility] and has been picked up. ?Patient will obtain prednisone and knows to start prednisone on the same day as Zytiga start. ? ?Reviewed with patient importance of keeping a medication schedule and plan for any missed doses. No barriers to medication adherence identified. ? ?Medication reconciliation performed and medication/allergy list updated. ? ?Ship broker for [name] has been obtained. ?Patient will be receiving medication through [facility]. Patient states they will be receiving medication on 03/05/2022.  ? ?Patient informed the pharmacy will reach out 5-7 days prior to needing next fill of Zytiga to coordinate continued medication acquisition to prevent break in therapy. ? ?All questions answered. ? ?Frederick Ponce voiced understanding and appreciation.  ? ?Medication education handout placed in mail for patient. Patient knows to call the office with questions or concerns. Oral Chemotherapy  Clinic phone number provided to patient.  ? ?Drema Halon, PharmD ?Hematology/Oncology Clinical Pharmacist ?[facility] ?[phone] ?02/27/2022 3:01 PM ? ?

## 2022-03-06 ENCOUNTER — Encounter: Payer: Self-pay | Admitting: Nurse Practitioner

## 2022-03-06 ENCOUNTER — Inpatient Hospital Stay: Payer: Medicare Other | Attending: Nurse Practitioner | Admitting: Nurse Practitioner

## 2022-03-06 DIAGNOSIS — R5383 Other fatigue: Secondary | ICD-10-CM

## 2022-03-06 DIAGNOSIS — R6883 Chills (without fever): Secondary | ICD-10-CM | POA: Diagnosis not present

## 2022-03-06 DIAGNOSIS — C61 Malignant neoplasm of prostate: Secondary | ICD-10-CM

## 2022-03-06 DIAGNOSIS — Z79899 Other long term (current) drug therapy: Secondary | ICD-10-CM | POA: Insufficient documentation

## 2022-03-06 DIAGNOSIS — Z515 Encounter for palliative care: Secondary | ICD-10-CM

## 2022-03-06 DIAGNOSIS — G20 Parkinson's disease: Secondary | ICD-10-CM | POA: Insufficient documentation

## 2022-03-06 DIAGNOSIS — C7951 Secondary malignant neoplasm of bone: Secondary | ICD-10-CM

## 2022-03-06 NOTE — Progress Notes (Signed)
? ?  ?Palliative Medicine ?[place]  ?Telephone:([phone]) [phone] [phone] ? ? ?Name: Frederick Ponce ?Date: 03/06/2022 ?MRN: [id number]  ?DOB: Feb 12, 1946 ? ?Patient Care Team: ?[facility] [place] as PCP - General  ? ?I connected with Frederick Ponce on 03/06/22 at 11:45 AM EDT by phone and verified that I am speaking with the correct person using two identifiers.  ? ?I discussed the limitations, risks, security and privacy concerns of performing an evaluation and management service by telemedicine and the availability of in-person appointments. I also discussed with the patient that there may be a patient responsible charge related to this service. The patient expressed understanding and agreed to proceed.  ? ?Other persons participating in the visit and their role in the encounter: N/A  ? ?Patient?s location: Home  ?Provider?s location: [place]   ? ?Chief Complaint: Follow-Up/Symptom Management   ? ?REASON FOR CONSULTATION: ?Frederick Ponce is a 76 y.o. male with medical history including Parkinson's disease and hyperlipidemia. 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 He is having urgent radiation appointment due to iliac bone pain. Palliative ask to see for symptom management.  ? ? ?SOCIAL HISTORY:    ? reports that he has never smoked. He has never used smokeless tobacco. He reports current alcohol use of about 21.0 standard drinks per week. He reports that he does not use drugs. ? ?ADVANCE DIRECTIVES:  ?Patient is scheduled to complete advanced directives at our clinic here at the cancer center on 02/16/2022. MOST form provided.  Patient plans to review and complete at a later time. ? ? ?CODE STATUS:  ? ?PAST MEDICAL HISTORY: ?Past Medical History:  ?Diagnosis Date  ? Hyperlipidemia   ? Parkinson disease ([place])   ? ? ?PAST SURGICAL HISTORY: History reviewed.  No pertinent surgical history. ? ?HEMATOLOGY/ONCOLOGY HISTORY:  ?Oncology History  ?Prostate 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 [facility])  ?01/18/2022 Initial Diagnosis  ? Prostate 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 [facility]) ?  ?02/20/2022 -  Chemotherapy  ? Patient is on Treatment Plan : PROSTATE Docetaxel q21d  ?   ? ? ?ALLERGIES:  has No Known Allergies. ? ?MEDICATIONS:  ?Current Outpatient Medications  ?Medication Sig Dispense Refill  ? abiraterone acetate (ZYTIGA) 250 MG tablet Take 4 tablets (1,000 mg total) by mouth daily. Take on an empty stomach 1 hour before or 2 hours after a meal 120 tablet 0  ? atorvastatin (LIPITOR) 40 MG tablet Take 40 mg by mouth daily.    ? atorvastatin (LIPITOR) 40 MG tablet Take 1 tablet by mouth at bedtime.    ? carbidopa-levodopa (SINEMET) 25-100 MG tablet Take 1.5 tablets by mouth daily. 135 tablet 3  ? co-enzyme Q-10 30 MG capsule Take 30 mg by mouth 3 (three) times daily.    ? diphenhydrAMINE (BENADRYL) 25 MG tablet Take 1 tablet (25 mg total) by mouth every 6 (six) hours as needed. 30 tablet 0  ? EPINEPHrine 0.3 mg/0.3 mL IJ SOAJ injection Inject 0.3 mg into the muscle as needed for anaphylaxis. 1 each 0  ? ibuprofen (ADVIL) 200 MG tablet 1 tablet with food or milk as needed    ? KRILL OIL PO Take by mouth.    ? lidocaine (LIDODERM) 5 % Place 1 patch onto the skin daily. Remove & Discard patch within 12 hours or as directed by MD 30 patch 0  ? morphine (MS CONTIN) 15 MG 12  hr tablet Take 1 tablet (15 mg total) by mouth every 12 (twelve) hours. 60 tablet 0  ? multivitamin-lutein (OCUVITE-LUTEIN) CAPS capsule Take 1 capsule by mouth daily.    ? predniSONE (DELTASONE) 5 MG tablet Take 1 tablet (5 mg total) by mouth daily with breakfast. 90 tablet 1  ? prochlorperazine (COMPAZINE) 10 MG tablet Take 1 tablet (10 mg total) by mouth every 6 (six) hours as needed for nausea or vomiting. 30 tablet 0  ? propranolol (INDERAL) 40 MG tablet Take 1 tablet by mouth daily.    ? vitamin B-12 (CYANOCOBALAMIN) 1000 MCG  tablet Take 1,000 mcg by mouth daily.    ? ?No current facility-administered medications for this visit.  ? ? ?PERFORMANCE STATUS (ECOG) : 1 - Symptomatic but completely ambulatory ? ? ? ?IMPRESSION: ? ?I connected wit Frederick Ponce today for follow-up on symptom management. He is able to perform some task around the home. Does endorse some mild fatigue.  ? ?Reports he is doing ok with some concerns for cold and hot sweats during the late evening night hours. He has started Zytiga as prescribed by Dr. Alen Blew. Reports these temperature change symptoms were ongoing prior to starting medication. Shares his sleep is interrupted due to symptoms. He will wake up cold and shivering to the point he is in a fetal position. Once he makes adjustments (blankets and clothing) will later become hot and drenched in sweat. Nothing makes symptoms worst but he does have some relief when taking his MS Contin.  ? ?Left Iliac bone pain  ?Frederick Ponce pain has significantly improved.  He attributes this to a positive response from radiation treatment.  He has not been taking MS Contin as pain significantly improved and also with concerns with the start of Zytiga. Taking MS Contin at night does seem to help with his night chills and sweats. Reports when awakening each morning he has generalized aches from his muscles being so tensed during episodes.  ? ?I spoke with Dr. Alen Blew regarding patient's concerns. Advised to continue with MS Contin at bedtime if gaining some relief. Patient has been advised. We will continue to closely monitor. ? ?If no improvement could consider gabapentin, Cymbalta, or paroxetine.    ? ? ?Constipation ?Much improved.  Is only taking prune juice at this time. ? ?3.  Goals of care ?Frederick Ponce and Frederick Ponce are remaining hopeful for some stability. They completed advanced directive paperwork on 02/16/22. He identified Frederick Ponce as his primary Scientist, research (medical) and Frederick Ponce as secondary. Would not want life-sustaining measures. MOST form  also was completed at this time.  ? ?Cardiopulmonary Resuscitation: Do Not Attempt Resuscitation (DNR/No CPR)  ?Medical Interventions: Comfort Measures: Keep clean, warm, and dry. Use medication by any route, positioning, wound care, and other measures to relieve pain and suffering. Use oxygen, suction and manual treatment of airway obstruction as needed for comfort. Do not transfer to the hospital unless comfort needs cannot be met in current location.  ?Antibiotics: Antibiotics if indicated  ?IV Fluids: IV fluids if indicated  ?Feeding Tube: No feeding tube  ?  ? ?PLAN: ?Pain is much improved overall. Complains of generalized aches related to ongoing night sweats, chills, rigors due to muscle tension. Advised to restart MS Contin at bedtime. Also discussed with Dr. Alen Blew.  ?Tylenol extra strength 3 times a day as needed ?Lidoderm patch as needed ? May continue prune juice for bowel regimen. ?Patient will plan to follow-up with Dr. Alen Blew in regards to ongoing questions.  ?  We will see patient back in 2-3 weeks in collaboration to other oncology appointments.  ? ?Patient expressed understanding and was in agreement with this plan. He also understands that He can call the clinic at any time with any questions, concerns, or complaints.  ? ?Time Total: 30 min.  ? ?Visit consisted of counseling and education dealing with the complex and emotionally intense issues of symptom management and palliative care in the setting of serious and potentially life-threatening illness.Greater than 50%  of this time was spent counseling and coordinating care related to the above assessment and plan. ? ?Alda Lea, AGPCNP-BC  ?[place] ? ? ? ? ?

## 2022-03-12 MED FILL — Dexamethasone Sodium Phosphate Inj 100 MG/10ML: INTRAMUSCULAR | Qty: 1 | Status: AC

## 2022-03-13 ENCOUNTER — Other Ambulatory Visit: Payer: Self-pay

## 2022-03-13 ENCOUNTER — Inpatient Hospital Stay: Payer: Medicare Other

## 2022-03-13 ENCOUNTER — Inpatient Hospital Stay (HOSPITAL_BASED_OUTPATIENT_CLINIC_OR_DEPARTMENT_OTHER): Payer: Medicare Other | Admitting: Oncology

## 2022-03-13 ENCOUNTER — Ambulatory Visit: Payer: Medicare Other

## 2022-03-13 ENCOUNTER — Telehealth: Payer: Self-pay

## 2022-03-13 ENCOUNTER — Encounter: Payer: Self-pay | Admitting: Urology

## 2022-03-13 VITALS — BP 130/71 | HR 77 | Temp 97.2°F | Resp 18 | Wt 168.2 lb

## 2022-03-13 DIAGNOSIS — G20 Parkinson's disease: Secondary | ICD-10-CM | POA: Diagnosis not present

## 2022-03-13 DIAGNOSIS — C61 Malignant neoplasm of prostate: Secondary | ICD-10-CM | POA: Diagnosis not present

## 2022-03-13 LAB — CBC WITH DIFFERENTIAL (CANCER CENTER ONLY)
Abs Immature Granulocytes: 0.01 10*3/uL (ref 0.00–0.07)
Basophils Absolute: 0 10*3/uL (ref 0.0–0.1)
Basophils Relative: 0 %
Eosinophils Absolute: 0.1 10*3/uL (ref 0.0–0.5)
Eosinophils Relative: 2 %
HCT: 37.5 % — ABNORMAL LOW (ref 39.0–52.0)
Hemoglobin: 12.3 g/dL — ABNORMAL LOW (ref 13.0–17.0)
Immature Granulocytes: 0 %
Lymphocytes Relative: 27 %
Lymphs Abs: 1.4 10*3/uL (ref 0.7–4.0)
MCH: 32.7 pg (ref 26.0–34.0)
MCHC: 32.8 g/dL (ref 30.0–36.0)
MCV: 99.7 fL (ref 80.0–100.0)
Monocytes Absolute: 0.6 10*3/uL (ref 0.1–1.0)
Monocytes Relative: 12 %
Neutro Abs: 3.1 10*3/uL (ref 1.7–7.7)
Neutrophils Relative %: 59 %
Platelet Count: 209 10*3/uL (ref 150–400)
RBC: 3.76 MIL/uL — ABNORMAL LOW (ref 4.22–5.81)
RDW: 13.1 % (ref 11.5–15.5)
WBC Count: 5.2 10*3/uL (ref 4.0–10.5)
nRBC: 0 % (ref 0.0–0.2)

## 2022-03-13 LAB — CMP (CANCER CENTER ONLY)
ALT: 8 U/L (ref 0–44)
AST: 17 U/L (ref 15–41)
Albumin: 3.9 g/dL (ref 3.5–5.0)
Alkaline Phosphatase: 142 U/L — ABNORMAL HIGH (ref 38–126)
Anion gap: 6 (ref 5–15)
BUN: 14 mg/dL (ref 8–23)
CO2: 27 mmol/L (ref 22–32)
Calcium: 9.2 mg/dL (ref 8.9–10.3)
Chloride: 108 mmol/L (ref 98–111)
Creatinine: 0.82 mg/dL (ref 0.61–1.24)
GFR, Estimated: >60 mL/min (ref >60)
Glucose, Bld: 126 mg/dL — ABNORMAL HIGH (ref 70–99)
Potassium: 3.6 mmol/L (ref 3.5–5.1)
Sodium: 141 mmol/L (ref 135–145)
Total Bilirubin: 1.1 mg/dL (ref 0.3–1.2)
Total Protein: 6.3 g/dL — ABNORMAL LOW (ref 6.5–8.1)

## 2022-03-13 MED ORDER — OYSTER SHELL CALCIUM/D3 500-5 MG-MCG PO TABS: 1.0000 | ORAL_TABLET | Freq: Two times a day (BID) | ORAL | 3 refills | Status: DC

## 2022-03-13 NOTE — Progress Notes (Signed)
Hematology and Oncology Follow Up Visit ? ?Frederick Ponce ?[id number] ?Mar 18, 1946 76 y.o. ?03/13/2022 8:18 AM ?[facility] [place], [place]  ? 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  Presented with a PSA of 22.5. ? ? ?Prior Therapy: ?CT-guided biopsy of a pulmonary nodule obtained on January 15, 2022 which confirmed the presence of prostate cancer. ? ?He is status post radiation therapy to the left sacroiliac pelvic bones completed in February 2023.  He received 30 Gray in 10 fractions ? ?Current therapy: ? ?Firmagon 240 mg started on January 23, 2022.  Last injection was given on February 20, 2022. ? ?Zytiga 1000 mg daily with prednisone 5 mg daily started on March 06, 2022. ? ?Interim History: Frederick Ponce returns today for a follow-up visit.  Since the last visit, he opted against Taxotere chemotherapy and currently on Zytiga.  He tolerated the first week of therapy without any major complaints.  He does report some occasional hot flashes but overall manageable.  He reports significant improvement in his back pain and no longer taking any pain medication.  He denies any nausea, vomiting or abdominal pain.  He denies any lower extremity edema.  His performance status quality of life remained stable and improving. ? ? ? ?Medications: I have reviewed the patient's current medications.  ?Current Outpatient Medications  ?Medication Sig Dispense Refill  ? abiraterone acetate (ZYTIGA) 250 MG tablet Take 4 tablets (1,000 mg total) by mouth daily. Take on an empty stomach 1 hour before or 2 hours after a meal 120 tablet 0  ? atorvastatin (LIPITOR) 40 MG tablet Take 40 mg by mouth daily.    ? atorvastatin (LIPITOR) 40 MG tablet Take 1 tablet by mouth at bedtime.    ? carbidopa-levodopa (SINEMET) 25-100 MG tablet Take 1.5 tablets by mouth daily. 135 tablet 3  ? co-enzyme Q-10 30 MG  capsule Take 30 mg by mouth 3 (three) times daily.    ? diphenhydrAMINE (BENADRYL) 25 MG tablet Take 1 tablet (25 mg total) by mouth every 6 (six) hours as needed. 30 tablet 0  ? EPINEPHrine 0.3 mg/0.3 mL IJ SOAJ injection Inject 0.3 mg into the muscle as needed for anaphylaxis. 1 each 0  ? ibuprofen (ADVIL) 200 MG tablet 1 tablet with food or milk as needed    ? KRILL OIL PO Take by mouth.    ? lidocaine (LIDODERM) 5 % Place 1 patch onto the skin daily. Remove & Discard patch within 12 hours or as directed by MD 30 patch 0  ? morphine (MS CONTIN) 15 MG 12 hr tablet Take 1 tablet (15 mg total) by mouth every 12 (twelve) hours. 60 tablet 0  ? multivitamin-lutein (OCUVITE-LUTEIN) CAPS capsule Take 1 capsule by mouth daily.    ? predniSONE (DELTASONE) 5 MG tablet Take 1 tablet (5 mg total) by mouth daily with breakfast. 90 tablet 1  ? prochlorperazine (COMPAZINE) 10 MG tablet Take 1 tablet (10 mg total) by mouth every 6 (six) hours as needed for nausea or vomiting. 30 tablet 0  ? propranolol (INDERAL) 40 MG tablet Take 1 tablet by mouth daily.    ? vitamin B-12 (CYANOCOBALAMIN) 1000 MCG tablet Take 1,000 mcg by mouth daily.    ? ?No current facility-administered medications for this visit.  ? ? ? ?Allergies: No Known Allergies ? ? ? ?Physical Exam: ?Blood pressure 130/71, pulse 77, temperature (!) 97.2 ?F (36.2 ?C), temperature  source Tympanic, resp. rate 18, weight 168 lb 4 oz (76.3 kg), SpO2 100 %. ? ?ECOG: 1 ? ? ? ?General appearance: Comfortable appearing without any discomfort ?Head: Normocephalic without any trauma ?Oropharynx: Mucous membranes are moist and pink without any thrush or ulcers. ?Eyes: Pupils are equal and round reactive to light. ?Lymph nodes: No cervical, supraclavicular, inguinal or axillary lymphadenopathy.   ?Heart:regular rate and rhythm.  S1 and S2 without leg edema. ?Lung: Clear without any rhonchi or wheezes.  No dullness to percussion. ?Abdomin: Soft, nontender, nondistended with good  bowel sounds.  No hepatosplenomegaly. ?Musculoskeletal: No joint deformity or effusion.  Full range of motion noted. ?Neurological: No deficits noted on motor, sensory and deep tendon reflex exam. ?Skin: No petechial rash or dryness.  Appeared moist.  ? ? ? ? ?Lab Results: ?Lab Results  ?Component Value Date  ? WBC 6.1 01/20/2022  ? HGB 13.0 01/20/2022  ? HCT 40.2 01/20/2022  ? MCV 103.3 (H) 01/20/2022  ? PLT 233 01/20/2022  ? ?  Chemistry   ?   ?Component Value Date/Time  ? NA 137 01/20/2022 1100  ? NA 140 09/14/2021 0920  ? K 3.9 01/20/2022 1100  ? CL 106 01/20/2022 1100  ? CO2 23 01/20/2022 1100  ? BUN 27 (H) 01/20/2022 1100  ? BUN 18 09/14/2021 0920  ? CREATININE 0.79 01/20/2022 1100  ? CREATININE 0.86 12/27/2021 1441  ?    ?Component Value Date/Time  ? CALCIUM 8.9 01/20/2022 1100  ? ALKPHOS 239 (H) 01/20/2022 1100  ? AST 31 01/20/2022 1100  ? AST 24 12/27/2021 1441  ? ALT 10 01/20/2022 1100  ? ALT 22 12/27/2021 1441  ? BILITOT 1.0 01/20/2022 1100  ? BILITOT 1.0 12/27/2021 1441  ?  ? ? ? ? ? ?Impression and Plan: ? ?76 year old with: ? ?1.  Advanced prostate cancer with disease to the bone and pulmonary involvement diagnosed in January 2023.  He has castration-sensitive disease. ?  ?Treatment choices moving forward were discussed at this time.  He is currently on Zytiga long-term complications were reiterated.  These include fatigue, tiredness, hypertension and adrenal insufficiency.  Alternative treatment options including Xtandi and others.  He is agreeable to continue at this time. ?  ?  ?2.  Bone directed therapy: Risks and benefits of using Xgeva in the future were discussed.  I recommended calcium and vitamin D supplements.  He is up-to-date on his dental maintenance without any issues.  I anticipate starting Xgeva in April 2023.  Prescription for calcium will be available to him. ?  ?  ?3.  Parkinson's disease: He requested referral to a local neurologist which will assist him with that. ? ?4.  Androgen  deprivation therapy: This will be continued indefinitely.  He will receive Eligard next week and repeated every 4 months.  Complication clinic hot flashes, weight gain among others were reiterated. ?  ?5.  Bone pain: Related to advanced prostate cancer currently resolved. ?  ?6.  Electrolyte imbalance and hypertension: We will continue to monitor those on Zytiga.  No issues reported at this time with his potassium.  Blood pressure is within normal range. ?  ?7.  Follow-up: In 3 weeks for repeat follow-up. ?  ?  ?  ?30  minutes were spent on this encounter.  The time was dedicated to reviewing laboratory data, disease status update and outlining future plan of care review. ? ? ?Zola Button, MD ?3/21/20238:18 AM ? ?

## 2022-03-13 NOTE — Telephone Encounter (Signed)
Left voicemail reminding patient of his 9:30am-03/14/22 telephone appointment w/ Ashlyn Bruing PA-C. I left my extension [phone] and asked that patient return my call, so that I may complete the nursing portion of this appointment. ?

## 2022-03-13 NOTE — Progress Notes (Signed)
Telephone F/U appointment. Verified patient identity and began nursing interview. Patient reports fatigue, hot flashes, and general muscle pain. No other issues reported to me at this time. ? ?Meaningful use complete. ?I-PSS score of 3 (mild). ?No urinary management medications currently. ?Urology appointment- None ? ?Patient reminded of his 9:30am-03/14/22 telephone appointment w/ Ashlyn Bruning PA-C. I left my extension [phone] in case patient needs anything. ? ?Patient contact [phone] ?

## 2022-03-14 ENCOUNTER — Telehealth: Payer: Self-pay | Admitting: *Deleted

## 2022-03-14 ENCOUNTER — Ambulatory Visit
Admission: RE | Admit: 2022-03-14 | Discharge: 2022-03-14 | Disposition: A | Discharge status: Home / Self Care | Payer: Medicare Other | Source: Ambulatory Visit | Attending: Radiation Oncology | Admitting: Radiation Oncology

## 2022-03-14 DIAGNOSIS — C61 Malignant neoplasm of prostate: Secondary | ICD-10-CM | POA: Insufficient documentation

## 2022-03-14 DIAGNOSIS — C7951 Secondary malignant neoplasm of bone: Secondary | ICD-10-CM | POA: Insufficient documentation

## 2022-03-14 LAB — PROSTATE-SPECIFIC AG, SERUM (LABCORP): Prostate Specific Ag, Serum: 0.3 ng/mL (ref 0.0–4.0)

## 2022-03-14 NOTE — Telephone Encounter (Signed)
Per Dr.Shadad, called pt and left vm on personal phone with message below. Advised to call office if there were any concerns. ?

## 2022-03-14 NOTE — Telephone Encounter (Signed)
-----   Message from Wyatt Portela, MD sent at 03/14/2022  9:29 AM EDT ----- ?Please let him know his PSA is down ?

## 2022-03-15 ENCOUNTER — Ambulatory Visit: Payer: Medicare Other

## 2022-03-15 ENCOUNTER — Encounter: Payer: Self-pay | Admitting: Nurse Practitioner

## 2022-03-15 ENCOUNTER — Inpatient Hospital Stay (HOSPITAL_BASED_OUTPATIENT_CLINIC_OR_DEPARTMENT_OTHER): Payer: Medicare Other | Admitting: Nurse Practitioner

## 2022-03-15 ENCOUNTER — Ambulatory Visit: Payer: Medicare Other | Admitting: Oncology

## 2022-03-15 ENCOUNTER — Inpatient Hospital Stay: Payer: Medicare Other

## 2022-03-15 ENCOUNTER — Inpatient Hospital Stay: Payer: Medicare Other | Admitting: Nurse Practitioner

## 2022-03-15 ENCOUNTER — Other Ambulatory Visit: Payer: Medicare Other

## 2022-03-15 ENCOUNTER — Other Ambulatory Visit: Payer: Self-pay

## 2022-03-15 ENCOUNTER — Telehealth: Payer: Self-pay | Admitting: Nurse Practitioner

## 2022-03-15 VITALS — BP 133/68 | HR 70 | Temp 97.4°F | Resp 16 | Wt 170.8 lb

## 2022-03-15 DIAGNOSIS — C61 Malignant neoplasm of prostate: Secondary | ICD-10-CM

## 2022-03-15 DIAGNOSIS — C7951 Secondary malignant neoplasm of bone: Secondary | ICD-10-CM

## 2022-03-15 DIAGNOSIS — G893 Neoplasm related pain (acute) (chronic): Secondary | ICD-10-CM

## 2022-03-15 DIAGNOSIS — Z515 Encounter for palliative care: Secondary | ICD-10-CM

## 2022-03-15 MED ORDER — MORPHINE SULFATE ER 15 MG PO TBCR: 15.0000 mg | EXTENDED_RELEASE_TABLET | Freq: Every day | ORAL | 0 refills | Status: DC

## 2022-03-15 NOTE — Progress Notes (Signed)
? ?  ?Palliative Medicine ?[place]  ?Telephone:(336) [phone] [phone] ? ? ?Name: Frederick Ponce ?Date: 03/15/2022 ?MRN: [id number]  ?DOB: 24-Apr-1946 ? ?Patient Care Team: ?[facility] [place] as PCP - General ?Pickenpack-Cousar, Carlena Sax, NP as Nurse Practitioner (Nurse Practitioner)  ? ?REASON FOR CONSULTATION: ?Frederick Ponce is a 76 y.o. male with medical history including Parkinson's disease and hyperlipidemia. 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 He is having urgent radiation appointment due to iliac bone pain. Palliative ask to see for symptom management.  ? ? ?SOCIAL HISTORY:    ? reports that he has never smoked. He has never used smokeless tobacco. He reports current alcohol use of about 21.0 standard drinks per week. He reports that he does not use drugs. ? ?ADVANCE DIRECTIVES:  ?Patient is scheduled to complete advanced directives at our clinic here at the cancer center on 02/16/2022. MOST form provided.  Patient plans to review and complete at a later time. ? ? ?CODE STATUS:  ? ?PAST MEDICAL HISTORY: ?Past Medical History:  ?Diagnosis Date  ? Hyperlipidemia   ? Parkinson disease ([place])   ? ? ?PAST SURGICAL HISTORY: History reviewed. No pertinent surgical history. ? ?HEMATOLOGY/ONCOLOGY HISTORY:  ?Oncology History  ?Prostate 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 [facility])  ?01/18/2022 Initial Diagnosis  ? Prostate 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 [facility]) ?  ?02/20/2022 - 02/20/2022 Chemotherapy  ? Patient is on Treatment Plan : PROSTATE Docetaxel q21d  ?   ? ? ?ALLERGIES:  has No Known Allergies. ? ?MEDICATIONS:  ?Current Outpatient Medications  ?Medication Sig Dispense Refill  ? abiraterone acetate (ZYTIGA) 250 MG tablet Take 4 tablets (1,000 mg total) by mouth daily. Take on an empty stomach 1 hour before or 2 hours after a meal 120 tablet 0  ? atorvastatin (LIPITOR) 40 MG tablet  Take 40 mg by mouth daily.    ? atorvastatin (LIPITOR) 40 MG tablet Take 1 tablet by mouth at bedtime.    ? calcium-vitamin D (OSCAL WITH D) 500-5 MG-MCG tablet Take 1 tablet by mouth 2 (two) times daily. 60 tablet 3  ? carbidopa-levodopa (SINEMET) 25-100 MG tablet Take 1.5 tablets by mouth daily. 135 tablet 3  ? co-enzyme Q-10 30 MG capsule Take 30 mg by mouth 3 (three) times daily.    ? diphenhydrAMINE (BENADRYL) 25 MG tablet Take 1 tablet (25 mg total) by mouth every 6 (six) hours as needed. 30 tablet 0  ? EPINEPHrine 0.3 mg/0.3 mL IJ SOAJ injection Inject 0.3 mg into the muscle as needed for anaphylaxis. 1 each 0  ? ibuprofen (ADVIL) 200 MG tablet 1 tablet with food or milk as needed    ? KRILL OIL PO Take by mouth.    ? lidocaine (LIDODERM) 5 % Place 1 patch onto the skin daily. Remove & Discard patch within 12 hours or as directed by MD 30 patch 0  ? morphine (MS CONTIN) 15 MG 12 hr tablet Take 1 tablet (15 mg total) by mouth at bedtime. 30 tablet 0  ? multivitamin-lutein (OCUVITE-LUTEIN) CAPS capsule Take 1 capsule by mouth daily.    ? predniSONE (DELTASONE) 5 MG tablet Take 1 tablet (5 mg total) by mouth daily with breakfast. 90 tablet 1  ? prochlorperazine (COMPAZINE) 10 MG tablet Take 1 tablet (10 mg total) by mouth every 6 (six) hours as needed for nausea or vomiting. 30 tablet 0  ? propranolol (INDERAL) 40 MG  tablet Take 1 tablet by mouth daily.    ? vitamin B-12 (CYANOCOBALAMIN) 1000 MCG tablet Take 1,000 mcg by mouth daily.    ? ?No current facility-administered medications for this visit.  ? ? ?PERFORMANCE STATUS (ECOG) : 1 - Symptomatic but completely ambulatory ? ? ? ?IMPRESSION: ? ?Frederick Ponce presents to clinic today for symptom management follow-up. No acute distress noted. Husband Frederick Ponce is with him for support. Ambulatory with no assistive devices. Frederick Ponce reports he feels well outside of his ongoing hot flashes that he is having.  ? ?He is taking the MS Contin at bedtime which he states  significantly improves the flashes and aches that he would generally get from shivering so hard. He is not having to take the MS Contin in the day time. He is inquiring about another mentioned medication that was felt to assist in his hot flashes.  ? ?He and Frederick Ponce are hopeful that these episodes will decrease with his upcoming initiation of Eligard as he was formerly taking [place].  ? ?He will continue with current regimen until told or started on something different given MS Contin is effective.  ? ?Constipation ?Complains of occasional constipation.  He is currently only taking prune juice which may or may not provide some relief.  Recommendations to begin taking daily MiraLAX. ? ?PLAN: ?Continue MS Contin 15 mg at bedtime ?Tylenol extra strength 3 times a day as needed ?MiraLAX daily for bowel regimen ? We will see patient back in 3-4 weeks in collaboration to other oncology appointments.  ? ?Patient expressed understanding and was in agreement with this plan. He also understands that He can call the clinic at any time with any questions, concerns, or complaints.  ? ?Time Total: 20 min ? ?Visit consisted of counseling and education dealing with the complex and emotionally intense issues of symptom management and palliative care in the setting of serious and potentially life-threatening illness.Greater than 50%  of this time was spent counseling and coordinating care related to the above assessment and plan. ? ?Alda Lea, AGPCNP-BC  ?[place] ? ? ? ? ? ? ?

## 2022-03-15 NOTE — Telephone Encounter (Signed)
.  Called pt per 3/23 inbaskt , Patient was unavailable, a message with appt time and date was left with number on file.   ?

## 2022-03-16 NOTE — Progress Notes (Signed)
?Radiation Oncology         [phone] ?________________________________ ? ?Name: Frederick Ponce MRN: [id number]  ?Date: 03/14/2022  DOB: 1946-11-23 ? ?Post Treatment Note ? ?CC: [facility], [facility]  Orson Slick, MD ? 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 ?Interval Since Last Radiation:  4 weeks  ? 01/31/22 - 02/13/22:   The painful lesions in the left iliac and sacral bones were treated to 30 Gy in 10 fractions of 3 Gy each. ? ?Narrative:  I spoke with the patient to conduct his routine scheduled 1 month follow up visit via telephone to spare the patient unnecessary potential exposure in the healthcare setting during the current COVID-19 pandemic.  The patient was notified in advance and gave permission to proceed with this visit format. ? ?He tolerated radiation treatment relatively well without any ill side effects. He did report pain relief towards the end of treatment.                             ? ?On review of systems, the patient states that he is doing well in general and has had complete resolution of the left hip/low back pain which he is quite pleased with.  His only complaint at this point is with hot flashes and fatigue associated with his ADT/Zytiga therapy.  He did have a recent PSA drawn on 03/13/2022 showing an excellent response to treatment at 0.3 as compared to 22.5 in January 2023.  He denies any abdominal pain, nausea, vomiting, diarrhea or constipation.  Overall, he is quite pleased with his progress to date. ? ?ALLERGIES:  has No Known Allergies. ? ?Meds: ?Current Outpatient Medications  ?Medication Sig Dispense Refill  ? abiraterone acetate (ZYTIGA) 250 MG tablet Take 4 tablets (1,000 mg total) by mouth daily. Take on an empty stomach 1 hour before or 2 hours after a meal 120 tablet 0  ? atorvastatin (LIPITOR) 40 MG tablet Take 40 mg by mouth daily.    ? atorvastatin (LIPITOR) 40 MG tablet  Take 1 tablet by mouth at bedtime.    ? calcium-vitamin D (OSCAL WITH D) 500-5 MG-MCG tablet Take 1 tablet by mouth 2 (two) times daily. 60 tablet 3  ? carbidopa-levodopa (SINEMET) 25-100 MG tablet Take 1.5 tablets by mouth daily. 135 tablet 3  ? co-enzyme Q-10 30 MG capsule Take 30 mg by mouth 3 (three) times daily.    ? diphenhydrAMINE (BENADRYL) 25 MG tablet Take 1 tablet (25 mg total) by mouth every 6 (six) hours as needed. 30 tablet 0  ? EPINEPHrine 0.3 mg/0.3 mL IJ SOAJ injection Inject 0.3 mg into the muscle as needed for anaphylaxis. 1 each 0  ? ibuprofen (ADVIL) 200 MG tablet 1 tablet with food or milk as needed    ? KRILL OIL PO Take by mouth.    ? lidocaine (LIDODERM) 5 % Place 1 patch onto the skin daily. Remove & Discard patch within 12 hours or as directed by MD 30 patch 0  ? morphine (MS CONTIN) 15 MG 12 hr tablet Take 1 tablet (15 mg total) by mouth at bedtime. 30 tablet 0  ? multivitamin-lutein (OCUVITE-LUTEIN) CAPS capsule Take 1 capsule by mouth daily.    ? predniSONE (DELTASONE) 5 MG tablet Take 1 tablet (5 mg total) by mouth daily with breakfast. 90 tablet 1  ?  prochlorperazine (COMPAZINE) 10 MG tablet Take 1 tablet (10 mg total) by mouth every 6 (six) hours as needed for nausea or vomiting. 30 tablet 0  ? propranolol (INDERAL) 40 MG tablet Take 1 tablet by mouth daily.    ? vitamin B-12 (CYANOCOBALAMIN) 1000 MCG tablet Take 1,000 mcg by mouth daily.    ? ?No current facility-administered medications for this encounter.  ? ? ?Physical Findings: ? vitals were not taken for this visit.  ?Pain Assessment ?Pain Score: 0-No pain/10 ?Unable to assess due to telephone follow-up visit format. ? ?Lab Findings: ?Lab Results  ?Component Value Date  ? WBC 5.2 03/13/2022  ? HGB 12.3 (L) 03/13/2022  ? HCT 37.5 (L) 03/13/2022  ? MCV 99.7 03/13/2022  ? PLT 209 03/13/2022  ? ? ? ?Radiographic Findings: ?No results found. ? ?Impression/Plan: ?78. 76 yo man with newly diagnosed widely metastatic prostate cancer  involving the left sacroiliac pelvic bones  ?He has recovered well from the effects of his recent palliative radiotherapy and has had complete resolution of the left hip/lower back pain.  His only complaint today is of severe hot flashes associated with his ADT so I have advised him to reach out to Dr. Hazeline Junker nurse to see if they might consider prescribing something like Megace to help make the side effects more tolerable.  We discussed that while we are happy to continue to participate in his care if clinically indicated, at this point, we will plan to see him back on an as-needed basis.  He will continue in routine follow-up under the care and direction of Dr. Alen Blew for continued management of his systemic disease.  We enjoyed taking care of him and look forward to continuing to follow his progress via correspondence. ? ? ? ?Nicholos Johns, PA-C  ?

## 2022-03-17 ENCOUNTER — Ambulatory Visit: Payer: Medicare Other

## 2022-03-20 ENCOUNTER — Inpatient Hospital Stay: Payer: Medicare Other

## 2022-03-20 ENCOUNTER — Telehealth: Payer: Self-pay | Admitting: Oncology

## 2022-03-20 ENCOUNTER — Other Ambulatory Visit: Payer: Self-pay

## 2022-03-20 ENCOUNTER — Ambulatory Visit: Payer: Medicare Other

## 2022-03-20 DIAGNOSIS — C61 Malignant neoplasm of prostate: Secondary | ICD-10-CM | POA: Diagnosis not present

## 2022-03-20 DIAGNOSIS — C7951 Secondary malignant neoplasm of bone: Secondary | ICD-10-CM

## 2022-03-20 MED ORDER — LEUPROLIDE ACETATE (4 MONTH) 30 MG ~~LOC~~ KIT
30.0000 mg | PACK | Freq: Once | SUBCUTANEOUS | Status: AC
  Administered 2022-03-20: 30 mg via SUBCUTANEOUS
  Filled 2022-03-20: qty 30

## 2022-03-20 NOTE — Patient Instructions (Signed)
Leuprolide depot injection ?What is this medication? ?LEUPROLIDE (loo PROE lide) is a man-made protein that acts like a natural hormone in the body. It decreases testosterone in men and decreases estrogen in women. In men, this medicine is used to treat advanced prostate cancer. In women, some forms of this medicine may be used to treat endometriosis, uterine fibroids, or other male hormone-related problems. ?This medicine may be used for other purposes; ask your health care provider or pharmacist if you have questions. ?COMMON BRAND NAME(S): Eligard, Fensolv, Lupron Depot, Lupron Depot-Ped, Viadur ?What should I tell my care team before I take this medication? ?They need to know if you have any of these conditions: ?diabetes ?heart disease or previous heart attack ?high blood pressure ?high cholesterol ?mental illness ?osteoporosis ?pain or difficulty passing urine ?seizures ?spinal cord metastasis ?stroke ?suicidal thoughts, plans, or attempt; a previous suicide attempt by you or a family member ?tobacco smoker ?unusual vaginal bleeding (women) ?an unusual or allergic reaction to leuprolide, benzyl alcohol, other medicines, foods, dyes, or preservatives ?pregnant or trying to get pregnant ?breast-feeding ?How should I use this medication? ?This medicine is for injection into a muscle or for injection under the skin. It is given by a health care professional in a hospital or clinic setting. The specific product will determine how it will be given to you. Make sure you understand which product you receive and how often you will receive it. ?Talk to your pediatrician regarding the use of this medicine in children. Special care may be needed. ?Overdosage: If you think you have taken too much of this medicine contact a poison control center or emergency room at once. ?NOTE: This medicine is only for you. Do not share this medicine with others. ?What if I miss a dose? ?It is important not to miss a dose. Call your  doctor or health care professional if you are unable to keep an appointment. ?Depot injections: Depot injections are given either once-monthly, every 12 weeks, every 16 weeks, or every 24 weeks depending on the product you are prescribed. The product you are prescribed will be based on if you are male or male, and your condition. Make sure you understand your product and dosing. ?What may interact with this medication? ?Do not take this medicine with any of the following medications: ?chasteberry ?cisapride ?dronedarone ?pimozide ?thioridazine ?This medicine may also interact with the following medications: ?herbal or dietary supplements, like black cohosh or DHEA ?male hormones, like estrogens or progestins and birth control pills, patches, rings, or injections ?male hormones, like testosterone ?other medicines that prolong the QT interval (abnormal heart rhythm) ?This list may not describe all possible interactions. Give your health care provider a list of all the medicines, herbs, non-prescription drugs, or dietary supplements you use. Also tell them if you smoke, drink alcohol, or use illegal drugs. Some items may interact with your medicine. ?What should I watch for while using this medication? ?Visit your doctor or health care professional for regular checks on your progress. During the first weeks of treatment, your symptoms may get worse, but then will improve as you continue your treatment. You may get hot flashes, increased bone pain, increased difficulty passing urine, or an aggravation of nerve symptoms. Discuss these effects with your doctor or health care professional, some of them may improve with continued use of this medicine. ?Male patients may experience a menstrual cycle or spotting during the first months of therapy with this medicine. If this continues, contact your doctor or   health care professional. ?This medicine may increase blood sugar. Ask your healthcare provider if changes in diet  or medicines are needed if you have diabetes. ?What side effects may I notice from receiving this medication? ?Side effects that you should report to your doctor or health care professional as soon as possible: ?allergic reactions like skin rash, itching or hives, swelling of the face, lips, or tongue ?breathing problems ?chest pain ?depression or memory disorders ?pain in your legs or groin ?pain at site where injected or implanted ?seizures ?severe headache ?signs and symptoms of high blood sugar such as being more thirsty or hungry or having to urinate more than normal. You may also feel very tired or have blurry vision ?swelling of the feet and legs ?suicidal thoughts or other mood changes ?visual changes ?vomiting ?Side effects that usually do not require medical attention (report to your doctor or health care professional if they continue or are bothersome): ?breast swelling or tenderness ?decrease in sex drive or performance ?diarrhea ?hot flashes ?loss of appetite ?muscle, joint, or bone pains ?nausea ?redness or irritation at site where injected or implanted ?skin problems or acne ?This list may not describe all possible side effects. Call your doctor for medical advice about side effects. You may report side effects to FDA at 1-800-FDA-1088. ?Where should I keep my medication? ?This drug is given in a hospital or clinic and will not be stored at home. ?NOTE: This sheet is a summary. It may not cover all possible information. If you have questions about this medicine, talk to your doctor, pharmacist, or health care provider. ?? 2022 Elsevier/Gold Standard (2021-08-29 00:00:00) ? ?

## 2022-03-20 NOTE — Telephone Encounter (Signed)
Scheduled per 03/21 los, patient has been called and voicemail was left. 

## 2022-03-22 ENCOUNTER — Other Ambulatory Visit: Payer: Self-pay

## 2022-03-22 ENCOUNTER — Telehealth: Payer: Self-pay

## 2022-03-22 DIAGNOSIS — C61 Malignant neoplasm of prostate: Secondary | ICD-10-CM

## 2022-03-22 MED ORDER — ABIRATERONE ACETATE 250 MG PO TABS: 1000.0000 mg | ORAL_TABLET | Freq: Every day | ORAL | 0 refills | Status: DC

## 2022-03-22 NOTE — Telephone Encounter (Signed)
T/C from pt requesting a refill for his Zytiga.  He will be out on 4/11 and it takes a week to receive in the mail. ?

## 2022-03-22 NOTE — Telephone Encounter (Signed)
Refill for Zytiga sent to [facility]. ?

## 2022-03-30 ENCOUNTER — Other Ambulatory Visit: Payer: Self-pay | Admitting: *Deleted

## 2022-03-30 ENCOUNTER — Other Ambulatory Visit (HOSPITAL_COMMUNITY): Payer: Self-pay

## 2022-03-30 ENCOUNTER — Telehealth: Payer: Self-pay

## 2022-03-30 DIAGNOSIS — C61 Malignant neoplasm of prostate: Secondary | ICD-10-CM

## 2022-03-30 MED ORDER — ABIRATERONE ACETATE 250 MG PO TABS
1000.0000 mg | ORAL_TABLET | Freq: Every day | ORAL | 0 refills | Status: DC
  Filled 2022-03-30 (×2): qty 120, 30d supply, fill #0

## 2022-03-30 NOTE — Telephone Encounter (Signed)
Oral Oncology Pharmacist Encounter ? ?Spoke to patient who was having difficulty obtaining Zytiga medication [facility] We will be able to fill at [facility] due to discounted pricing of zytiga and patient expressed he would rather fill at [facility] for ease of acquiring medication. ? ?New prescription sent in to [facility]. Patient notified he can pick up medication.  ? ?Drema Halon, PharmD ?Hematology/Oncology Clinical Pharmacist ?[facility] ?[phone] ? ?

## 2022-04-03 ENCOUNTER — Inpatient Hospital Stay (HOSPITAL_BASED_OUTPATIENT_CLINIC_OR_DEPARTMENT_OTHER): Payer: Medicare Other | Admitting: Oncology

## 2022-04-03 ENCOUNTER — Inpatient Hospital Stay: Payer: Medicare Other

## 2022-04-03 ENCOUNTER — Encounter: Payer: Self-pay | Admitting: Nurse Practitioner

## 2022-04-03 ENCOUNTER — Inpatient Hospital Stay (HOSPITAL_BASED_OUTPATIENT_CLINIC_OR_DEPARTMENT_OTHER): Payer: Medicare Other | Admitting: Nurse Practitioner

## 2022-04-03 ENCOUNTER — Inpatient Hospital Stay: Payer: Medicare Other | Attending: Nurse Practitioner

## 2022-04-03 ENCOUNTER — Ambulatory Visit: Payer: Medicare Other

## 2022-04-03 ENCOUNTER — Other Ambulatory Visit: Payer: Self-pay

## 2022-04-03 VITALS — BP 136/67 | HR 80 | Temp 97.6°F | Resp 16 | Ht 68.0 in | Wt 170.6 lb

## 2022-04-03 DIAGNOSIS — Z515 Encounter for palliative care: Secondary | ICD-10-CM | POA: Diagnosis not present

## 2022-04-03 DIAGNOSIS — R530 Neoplastic (malignant) related fatigue: Secondary | ICD-10-CM

## 2022-04-03 DIAGNOSIS — Z79899 Other long term (current) drug therapy: Secondary | ICD-10-CM | POA: Diagnosis not present

## 2022-04-03 DIAGNOSIS — K5903 Drug induced constipation: Secondary | ICD-10-CM | POA: Diagnosis not present

## 2022-04-03 DIAGNOSIS — C61 Malignant neoplasm of prostate: Secondary | ICD-10-CM

## 2022-04-03 DIAGNOSIS — G893 Neoplasm related pain (acute) (chronic): Secondary | ICD-10-CM | POA: Diagnosis not present

## 2022-04-03 DIAGNOSIS — C7951 Secondary malignant neoplasm of bone: Secondary | ICD-10-CM

## 2022-04-03 DIAGNOSIS — Z923 Personal history of irradiation: Secondary | ICD-10-CM | POA: Diagnosis not present

## 2022-04-03 LAB — CMP (CANCER CENTER ONLY)
ALT: 8 U/L (ref 0–44)
AST: 18 U/L (ref 15–41)
Albumin: 3.9 g/dL (ref 3.5–5.0)
Alkaline Phosphatase: 110 U/L (ref 38–126)
Anion gap: 7 (ref 5–15)
BUN: 13 mg/dL (ref 8–23)
CO2: 24 mmol/L (ref 22–32)
Calcium: 9.2 mg/dL (ref 8.9–10.3)
Chloride: 109 mmol/L (ref 98–111)
Creatinine: 0.74 mg/dL (ref 0.61–1.24)
GFR, Estimated: >60 mL/min (ref >60)
Glucose, Bld: 107 mg/dL — ABNORMAL HIGH (ref 70–99)
Potassium: 3.7 mmol/L (ref 3.5–5.1)
Sodium: 140 mmol/L (ref 135–145)
Total Bilirubin: 1 mg/dL (ref 0.3–1.2)
Total Protein: 6.3 g/dL — ABNORMAL LOW (ref 6.5–8.1)

## 2022-04-03 LAB — CBC WITH DIFFERENTIAL (CANCER CENTER ONLY)
Abs Immature Granulocytes: 0.01 10*3/uL (ref 0.00–0.07)
Basophils Absolute: 0 10*3/uL (ref 0.0–0.1)
Basophils Relative: 0 %
Eosinophils Absolute: 0.2 10*3/uL (ref 0.0–0.5)
Eosinophils Relative: 3 %
HCT: 36.3 % — ABNORMAL LOW (ref 39.0–52.0)
Hemoglobin: 11.9 g/dL — ABNORMAL LOW (ref 13.0–17.0)
Immature Granulocytes: 0 %
Lymphocytes Relative: 30 %
Lymphs Abs: 1.6 10*3/uL (ref 0.7–4.0)
MCH: 32.6 pg (ref 26.0–34.0)
MCHC: 32.8 g/dL (ref 30.0–36.0)
MCV: 99.5 fL (ref 80.0–100.0)
Monocytes Absolute: 0.5 10*3/uL (ref 0.1–1.0)
Monocytes Relative: 9 %
Neutro Abs: 2.9 10*3/uL (ref 1.7–7.7)
Neutrophils Relative %: 58 %
Platelet Count: 181 10*3/uL (ref 150–400)
RBC: 3.65 MIL/uL — ABNORMAL LOW (ref 4.22–5.81)
RDW: 12.6 % (ref 11.5–15.5)
WBC Count: 5.2 10*3/uL (ref 4.0–10.5)
nRBC: 0 % (ref 0.0–0.2)

## 2022-04-03 MED ORDER — DENOSUMAB 120 MG/1.7ML ~~LOC~~ SOLN
120.0000 mg | Freq: Once | SUBCUTANEOUS | Status: AC
  Administered 2022-04-03: 120 mg via SUBCUTANEOUS
  Filled 2022-04-03: qty 1.7

## 2022-04-03 NOTE — Progress Notes (Signed)
Hematology and Oncology Follow Up Visit ? ?Frederick Ponce ?[id number] ?1946/07/06 76 y.o. ?04/03/2022 8:52 AM ?Center, [place], [place]  ? ?Principle Diagnosis: 76 year old man with advanced prostate cancer with disease to the bone and lung involvement documented in January 2023.  He presented with castration-sensitive disease, PSA of 22.5.   ? ? ?Prior Therapy: ?CT-guided biopsy of a pulmonary nodule obtained on January 15, 2022 which confirmed the presence of prostate cancer. ? ?He is status post radiation therapy to the left sacroiliac pelvic bones completed in February 2023.  He received 30 Gray in 10 fractions ? ?Firmagon 240 mg started on January 23, 2022.  Last injection was given on February 20, 2022. ? ?Current therapy: ? ?Eligard 30 mg every 4 months started on March 20, 2022. ? ?Zytiga 1000 mg daily with prednisone 5 mg daily started on March 06, 2022. ? ?Interim History: Frederick Ponce is here for repeat follow-up.  Since the last visit, he reports no major changes in his health.  He has tolerated the current therapy with Zytiga and prednisone without any recent complaints.  He denies any nausea, vomiting or abdominal pain.  He does report hot flashes which are overall manageable.  He has reported very little pain although takes morphine at nighttime which helps with his symptoms.  His performance status and quality of life remained overall stable. ? ? ? ?Medications: Reviewed without changes. ?Current Outpatient Medications  ?Medication Sig Dispense Refill  ? abiraterone acetate (ZYTIGA) 250 MG tablet Take 4 tablets (1,000 mg total) by mouth daily. Take on an empty stomach 1 hour before or 2 hours after a meal 120 tablet 0  ? atorvastatin (LIPITOR) 40 MG tablet Take 40 mg by mouth daily.    ? atorvastatin (LIPITOR) 40 MG tablet Take 1 tablet by mouth at bedtime.    ? calcium-vitamin D (OSCAL WITH D) 500-5 MG-MCG tablet Take 1 tablet by mouth 2 (two) times daily. 60 tablet 3  ?  carbidopa-levodopa (SINEMET) 25-100 MG tablet Take 1.5 tablets by mouth daily. 135 tablet 3  ? co-enzyme Q-10 30 MG capsule Take 30 mg by mouth 3 (three) times daily.    ? diphenhydrAMINE (BENADRYL) 25 MG tablet Take 1 tablet (25 mg total) by mouth every 6 (six) hours as needed. 30 tablet 0  ? EPINEPHrine 0.3 mg/0.3 mL IJ SOAJ injection Inject 0.3 mg into the muscle as needed for anaphylaxis. 1 each 0  ? ibuprofen (ADVIL) 200 MG tablet 1 tablet with food or milk as needed    ? KRILL OIL PO Take by mouth.    ? lidocaine (LIDODERM) 5 % Place 1 patch onto the skin daily. Remove & Discard patch within 12 hours or as directed by MD 30 patch 0  ? morphine (MS CONTIN) 15 MG 12 hr tablet Take 1 tablet (15 mg total) by mouth at bedtime. 30 tablet 0  ? multivitamin-lutein (OCUVITE-LUTEIN) CAPS capsule Take 1 capsule by mouth daily.    ? predniSONE (DELTASONE) 5 MG tablet Take 1 tablet (5 mg total) by mouth daily with breakfast. 90 tablet 1  ? prochlorperazine (COMPAZINE) 10 MG tablet Take 1 tablet (10 mg total) by mouth every 6 (six) hours as needed for nausea or vomiting. 30 tablet 0  ? propranolol (INDERAL) 40 MG tablet Take 1 tablet by mouth daily.    ? vitamin B-12 (CYANOCOBALAMIN) 1000 MCG tablet Take 1,000 mcg by mouth daily.    ? ?No current facility-administered medications for this visit.  ? ? ? ?  Allergies: No Known Allergies ? ? ? ?Physical Exam: ?Blood pressure 136/67, pulse 80, temperature 97.6 ?F (36.4 ?C), temperature source Temporal, resp. rate 16, height '5\' 8"'$  (1.727 m), weight 170 lb 9.6 oz (77.4 kg), SpO2 100 %. ? ? ?ECOG: 1 ? ? ?General appearance: Alert, awake without any distress. ?Head: Atraumatic without abnormalities ?Oropharynx: Without any thrush or ulcers. ?Eyes: No scleral icterus. ?Lymph nodes: No lymphadenopathy noted in the cervical, supraclavicular, or axillary nodes ?Heart:regular rate and rhythm, without any murmurs or gallops.   ?Lung: Clear to auscultation without any rhonchi, wheezes or  dullness to percussion. ?Abdomin: Soft, nontender without any shifting dullness or ascites. ?Musculoskeletal: No clubbing or cyanosis. ?Neurological: No motor or sensory deficits. ?Skin: No rashes or lesions. ? ? ? ? ?Lab Results: ?Lab Results  ?Component Value Date  ? WBC 5.2 03/13/2022  ? HGB 12.3 (L) 03/13/2022  ? HCT 37.5 (L) 03/13/2022  ? MCV 99.7 03/13/2022  ? PLT 209 03/13/2022  ? ?  Chemistry   ?   ?Component Value Date/Time  ? NA 141 03/13/2022 0855  ? NA 140 09/14/2021 0920  ? K 3.6 03/13/2022 0855  ? CL 108 03/13/2022 0855  ? CO2 27 03/13/2022 0855  ? BUN 14 03/13/2022 0855  ? BUN 18 09/14/2021 0920  ? CREATININE 0.82 03/13/2022 0855  ?    ?Component Value Date/Time  ? CALCIUM 9.2 03/13/2022 0855  ? ALKPHOS 142 (H) 03/13/2022 0855  ? AST 17 03/13/2022 0855  ? ALT 8 03/13/2022 0855  ? BILITOT 1.1 03/13/2022 0855  ?  ? ? ? ? ? ?Impression and Plan: ? ?76 year old with: ? ?1.  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  ?  ?He is currently on Zytiga without any major complications.  Complication associated with this treatment were reiterated including edema, adrenal sufficiency, hypokalemia, hypertension and others.  He is agreeable to continue at this time.  Alternative treatment options including Taxotere chemotherapy, PARP inhibitor among others will be introduced if he has castration hyper resistant disease. ?  ?  ?2.  Bone directed therapy: Complication associated with Delton See were reiterated today including osteonecrosis of the jaw and hypocalcemia.  He is agreeable to proceed at this time. ?  ?  ?3.  Parkinson's disease: No recent exacerbation noted.  He has follow-up with neurology in the future. ? ?4.  Androgen deprivation therapy: He is currently on Eligard and will continue indefinitely.  Next injection will be at the end of July 2023. ?  ?5.  Bone pain: Significantly improved after the start of cancer treatments. ?   ?6.  Electrolyte imbalance and hypertension:  ?  ?7.  Follow-up: In  6 weeks to repeat evaluation. ?  ?  ?  ?30  minutes were dedicated to this visit.  The time was spent on reviewing his disease status, addressing complication related to cancer and cancer therapy and future plan of care discussion. ? ? ?Zola Button, MD ?4/11/20238:52 AM ? ?

## 2022-04-03 NOTE — Patient Instructions (Signed)
Denosumab injection ?What is this medication? ?DENOSUMAB (den oh sue mab) slows bone breakdown. Prolia is used to treat osteoporosis in women after menopause and in men, and in people who are taking corticosteroids for 6 months or more. Xgeva is used to treat a high calcium level due to cancer and to prevent bone fractures and other bone problems caused by multiple myeloma or cancer bone metastases. Xgeva is also used to treat giant cell tumor of the bone. ?This medicine may be used for other purposes; ask your health care provider or pharmacist if you have questions. ?COMMON BRAND NAME(S): Prolia, XGEVA ?What should I tell my care team before I take this medication? ?They need to know if you have any of these conditions: ?dental disease ?having surgery or tooth extraction ?infection ?kidney disease ?low levels of calcium or Vitamin D in the blood ?malnutrition ?on hemodialysis ?skin conditions or sensitivity ?thyroid or parathyroid disease ?an unusual reaction to denosumab, other medicines, foods, dyes, or preservatives ?pregnant or trying to get pregnant ?breast-feeding ?How should I use this medication? ?This medicine is for injection under the skin. It is given by a health care professional in a hospital or clinic setting. ?A special MedGuide will be given to you before each treatment. Be sure to read this information carefully each time. ?For Prolia, talk to your pediatrician regarding the use of this medicine in children. Special care may be needed. For Xgeva, talk to your pediatrician regarding the use of this medicine in children. While this drug may be prescribed for children as young as 13 years for selected conditions, precautions do apply. ?Overdosage: If you think you have taken too much of this medicine contact a poison control center or emergency room at once. ?NOTE: This medicine is only for you. Do not share this medicine with others. ?What if I miss a dose? ?It is important not to miss your dose.  Call your doctor or health care professional if you are unable to keep an appointment. ?What may interact with this medication? ?Do not take this medicine with any of the following medications: ?other medicines containing denosumab ?This medicine may also interact with the following medications: ?medicines that lower your chance of fighting infection ?steroid medicines like prednisone or cortisone ?This list may not describe all possible interactions. Give your health care provider a list of all the medicines, herbs, non-prescription drugs, or dietary supplements you use. Also tell them if you smoke, drink alcohol, or use illegal drugs. Some items may interact with your medicine. ?What should I watch for while using this medication? ?Visit your doctor or health care professional for regular checks on your progress. Your doctor or health care professional may order blood tests and other tests to see how you are doing. ?Call your doctor or health care professional for advice if you get a fever, chills or sore throat, or other symptoms of a cold or flu. Do not treat yourself. This drug may decrease your body's ability to fight infection. Try to avoid being around people who are sick. ?You should make sure you get enough calcium and vitamin D while you are taking this medicine, unless your doctor tells you not to. Discuss the foods you eat and the vitamins you take with your health care professional. ?See your dentist regularly. Brush and floss your teeth as directed. Before you have any dental work done, tell your dentist you are receiving this medicine. ?Do not become pregnant while taking this medicine or for 5 months after   stopping it. Talk with your doctor or health care professional about your birth control options while taking this medicine. Women should inform their doctor if they wish to become pregnant or think they might be pregnant. There is a potential for serious side effects to an unborn child. Talk to  your health care professional or pharmacist for more information. ?What side effects may I notice from receiving this medication? ?Side effects that you should report to your doctor or health care professional as soon as possible: ?allergic reactions like skin rash, itching or hives, swelling of the face, lips, or tongue ?bone pain ?breathing problems ?dizziness ?jaw pain, especially after dental work ?redness, blistering, peeling of the skin ?signs and symptoms of infection like fever or chills; cough; sore throat; pain or trouble passing urine ?signs of low calcium like fast heartbeat, muscle cramps or muscle pain; pain, tingling, numbness in the hands or feet; seizures ?unusual bleeding or bruising ?unusually weak or tired ?Side effects that usually do not require medical attention (report to your doctor or health care professional if they continue or are bothersome): ?constipation ?diarrhea ?headache ?joint pain ?loss of appetite ?muscle pain ?runny nose ?tiredness ?upset stomach ?This list may not describe all possible side effects. Call your doctor for medical advice about side effects. You may report side effects to FDA at 1-800-FDA-1088. ?Where should I keep my medication? ?This medicine is only given in a clinic, doctor's office, or other health care setting and will not be stored at home. ?NOTE: This sheet is a summary. It may not cover all possible information. If you have questions about this medicine, talk to your doctor, pharmacist, or health care provider. ?? 2022 Elsevier/Gold Standard (2018-04-18 00:00:00) ? ?

## 2022-04-03 NOTE — Progress Notes (Signed)
? ?  ?Palliative Medicine ?Frederick Ponce  ?[phone]) [phone] [phone] ? ? ?Name: Frederick Ponce ?Date: 04/03/2022 ?MRN: [id number]  ?DOB: 06-05-1946 ? ?Patient Care Team: ?[facility], [facility] as PCP - General ?Pickenpack-Cousar, Frederick Sax, NP as Nurse Practitioner (Nurse Practitioner)  ? ?REASON FOR CONSULTATION: ?Frederick Ponce is a 76 y.o. male with medical history including Parkinson's disease and hyperlipidemia. 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 He is having urgent radiation appointment due to iliac bone pain. Palliative ask to see for symptom management.  ? ? ?SOCIAL HISTORY:    ? reports that he has never smoked. He has never used smokeless tobacco. He reports current alcohol use of about 21.0 standard drinks per week. He reports that he does not use drugs. ? ?ADVANCE DIRECTIVES:  ?Patient is scheduled to complete advanced directives at our clinic here at the cancer center on 02/16/2022. MOST form provided.  Patient plans to review and complete at a later time. ? ? ?CODE STATUS:  ? ?PAST MEDICAL HISTORY: ?Past Medical History:  ?Diagnosis Date  ? Hyperlipidemia   ? Parkinson disease ([place])   ? ? ?PAST SURGICAL HISTORY: History reviewed. No pertinent surgical history. ? ?HEMATOLOGY/ONCOLOGY HISTORY:  ?Oncology History  ?Prostate 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 [facility])  ?01/18/2022 Initial Diagnosis  ? Prostate 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 [facility]) ?  ?02/20/2022 - 02/20/2022 Chemotherapy  ? Patient is on Treatment Plan : PROSTATE Docetaxel q21d  ?   ? ? ?ALLERGIES:  has No Known Allergies. ? ?MEDICATIONS:  ?Current Outpatient Medications  ?Medication Sig Dispense Refill  ? abiraterone acetate (ZYTIGA) 250 MG tablet Take 4 tablets (1,000 mg total) by mouth daily. Take on an empty stomach 1 hour before or 2 hours after a meal 120 tablet 0  ? atorvastatin (LIPITOR) 40 MG tablet  Take 40 mg by mouth daily.    ? atorvastatin (LIPITOR) 40 MG tablet Take 1 tablet by mouth at bedtime.    ? calcium-vitamin D (OSCAL WITH D) 500-5 MG-MCG tablet Take 1 tablet by mouth 2 (two) times daily. 60 tablet 3  ? carbidopa-levodopa (SINEMET) 25-100 MG tablet Take 1.5 tablets by mouth daily. 135 tablet 3  ? co-enzyme Q-10 30 MG capsule Take 30 mg by mouth 3 (three) times daily.    ? diphenhydrAMINE (BENADRYL) 25 MG tablet Take 1 tablet (25 mg total) by mouth every 6 (six) hours as needed. 30 tablet 0  ? EPINEPHrine 0.3 mg/0.3 mL IJ SOAJ injection Inject 0.3 mg into the muscle as needed for anaphylaxis. 1 each 0  ? ibuprofen (ADVIL) 200 MG tablet 1 tablet with food or milk as needed    ? KRILL OIL PO Take by mouth.    ? lidocaine (LIDODERM) 5 % Place 1 patch onto the skin daily. Remove & Discard patch within 12 hours or as directed by MD 30 patch 0  ? morphine (MS CONTIN) 15 MG 12 hr tablet Take 1 tablet (15 mg total) by mouth at bedtime. 30 tablet 0  ? multivitamin-lutein (OCUVITE-LUTEIN) CAPS capsule Take 1 capsule by mouth daily.    ? predniSONE (DELTASONE) 5 MG tablet Take 1 tablet (5 mg total) by mouth daily with breakfast. 90 tablet 1  ? prochlorperazine (COMPAZINE) 10 MG tablet Take 1 tablet (10 mg total) by mouth every 6 (six) hours as needed for nausea or vomiting. 30 tablet 0  ? propranolol (INDERAL) 40 MG  tablet Take 1 tablet by mouth daily.    ? vitamin B-12 (CYANOCOBALAMIN) 1000 MCG tablet Take 1,000 mcg by mouth daily.    ? ?No current facility-administered medications for this visit.  ? ? ?PERFORMANCE STATUS (ECOG) : 1 - Symptomatic but completely ambulatory ? ?Assessment ?No acute distress, well developed ?Normal breathing pattern ?AAO x3, mood appropriate  ? ? ?IMPRESSION: ? ?Frederick Ponce presents to clinic today for symptom management follow-up. No acute distress noted. Husband Frederick Ponce is with him for support. Ambulatory with no assistive devices. Frederick Ponce reports he is doing well. Appreciative of  how he is feeling. Shares about previous events he held in his home years prior when they lived in [place] with hopes of having a small gathering at his current home in the future. He has a grand piano in the home.  ? ?Frederick Ponce states a week ago he chose to discontinue the MS Contin at bedtime to see how he would do without it, in regards to hot flashes, night chills, muscle tension. Unfortunately this did not go well. States he was achy all over the next morning and flashes were extreme as before. Plans to restart at bedtime as this significantly decreased his symptoms and allowed for better functionality and quality of life during the day time.  ? ?Some constipation which is managed with prune juice and teas.  ? ? ?PLAN: ?Continue MS Contin 15 mg at bedtime ?Tylenol extra strength 3 times a day as needed ?Prune juice, tea, MiraLAX daily for bowel regimen ? We will see patient back in 6-8 weeks in collaboration to other oncology appointments.  ? ?Patient expressed understanding and was in agreement with this plan. He also understands that He can call the clinic at any time with any questions, concerns, or complaints.  ? ?Time Total: 20 min.  ? ?Visit consisted of counseling and education dealing with the complex and emotionally intense issues of symptom management and palliative care in the setting of serious and potentially life-threatening illness.Greater than 50%  of this time was spent counseling and coordinating care related to the above assessment and plan. ? ?Alda Lea, AGPCNP-BC  ?[place] ? ? ? ? ? ? ? ? ?

## 2022-04-04 LAB — PROSTATE-SPECIFIC AG, SERUM (LABCORP): Prostate Specific Ag, Serum: <0.1 ng/mL (ref 0.0–4.0)

## 2022-04-05 ENCOUNTER — Telehealth: Payer: Self-pay | Admitting: *Deleted

## 2022-04-05 ENCOUNTER — Ambulatory Visit: Payer: Medicare Other

## 2022-04-05 NOTE — Telephone Encounter (Signed)
-----   Message from Wyatt Portela, MD sent at 04/05/2022  8:15 AM EDT ----- ?Please let him know his PSA is down again.  ?

## 2022-04-05 NOTE — Telephone Encounter (Signed)
PC to patient, informed him his PSA is <0.1, he verbalizes understanding. 

## 2022-04-17 ENCOUNTER — Other Ambulatory Visit (HOSPITAL_COMMUNITY): Payer: Self-pay

## 2022-04-17 ENCOUNTER — Other Ambulatory Visit: Payer: Self-pay | Admitting: Oncology

## 2022-04-17 DIAGNOSIS — C7951 Secondary malignant neoplasm of bone: Secondary | ICD-10-CM

## 2022-04-17 MED ORDER — ABIRATERONE ACETATE 250 MG PO TABS
1000.0000 mg | ORAL_TABLET | Freq: Every day | ORAL | 0 refills | Status: DC
  Filled 2022-04-30: qty 120, 30d supply, fill #0

## 2022-04-19 ENCOUNTER — Other Ambulatory Visit (HOSPITAL_COMMUNITY): Payer: Self-pay

## 2022-04-26 NOTE — Progress Notes (Signed)
RN returned call, voicemail left for call back.  

## 2022-04-27 ENCOUNTER — Other Ambulatory Visit: Payer: Self-pay | Admitting: Nurse Practitioner

## 2022-04-27 ENCOUNTER — Telehealth: Payer: Self-pay | Admitting: *Deleted

## 2022-04-27 MED ORDER — MORPHINE SULFATE ER 15 MG PO TBCR: 15.0000 mg | EXTENDED_RELEASE_TABLET | Freq: Every day | ORAL | 0 refills | Status: DC

## 2022-04-27 NOTE — Telephone Encounter (Signed)
Patient called - LVM requesting refill MS Contin. Refill request given to ms. Pickenpack-Cousar, NP. ?Prescription refill sent to  [facility]. ?TCT patient - LVM informing patient that refill sent to [facility]. ?

## 2022-04-30 ENCOUNTER — Other Ambulatory Visit (HOSPITAL_COMMUNITY): Payer: Self-pay

## 2022-05-01 ENCOUNTER — Other Ambulatory Visit (HOSPITAL_COMMUNITY): Payer: Self-pay

## 2022-05-09 ENCOUNTER — Telehealth: Payer: Self-pay

## 2022-05-09 NOTE — Telephone Encounter (Signed)
Pt called regarding constipation with pain meds asking about changing his pain meds. Upon further assessment pt was not taking any stool softeners or laxatives pt agreeable to take a stool softener and maintain current pain med regimen. Pt informed via VM that he would be started on senna for his bowels and sent to preferred pharmacy.  ?

## 2022-05-23 ENCOUNTER — Other Ambulatory Visit: Payer: Self-pay

## 2022-05-23 ENCOUNTER — Inpatient Hospital Stay: Payer: Medicare Other | Attending: Nurse Practitioner

## 2022-05-23 ENCOUNTER — Inpatient Hospital Stay (HOSPITAL_BASED_OUTPATIENT_CLINIC_OR_DEPARTMENT_OTHER): Payer: Medicare Other | Admitting: Nurse Practitioner

## 2022-05-23 ENCOUNTER — Inpatient Hospital Stay (HOSPITAL_BASED_OUTPATIENT_CLINIC_OR_DEPARTMENT_OTHER): Payer: Medicare Other | Admitting: Oncology

## 2022-05-23 ENCOUNTER — Inpatient Hospital Stay: Payer: Medicare Other

## 2022-05-23 ENCOUNTER — Ambulatory Visit: Payer: Medicare Other

## 2022-05-23 ENCOUNTER — Other Ambulatory Visit: Payer: Self-pay | Admitting: Oncology

## 2022-05-23 ENCOUNTER — Other Ambulatory Visit (HOSPITAL_COMMUNITY): Payer: Self-pay

## 2022-05-23 VITALS — BP 123/81 | HR 77 | Temp 98.1°F | Resp 15 | Wt 170.5 lb

## 2022-05-23 DIAGNOSIS — G20 Parkinson's disease: Secondary | ICD-10-CM | POA: Insufficient documentation

## 2022-05-23 DIAGNOSIS — C61 Malignant neoplasm of prostate: Secondary | ICD-10-CM

## 2022-05-23 DIAGNOSIS — Z515 Encounter for palliative care: Secondary | ICD-10-CM | POA: Diagnosis not present

## 2022-05-23 DIAGNOSIS — C7951 Secondary malignant neoplasm of bone: Secondary | ICD-10-CM

## 2022-05-23 DIAGNOSIS — E878 Other disorders of electrolyte and fluid balance, not elsewhere classified: Secondary | ICD-10-CM | POA: Insufficient documentation

## 2022-05-23 DIAGNOSIS — I10 Essential (primary) hypertension: Secondary | ICD-10-CM | POA: Diagnosis not present

## 2022-05-23 DIAGNOSIS — Z923 Personal history of irradiation: Secondary | ICD-10-CM | POA: Diagnosis not present

## 2022-05-23 DIAGNOSIS — G893 Neoplasm related pain (acute) (chronic): Secondary | ICD-10-CM

## 2022-05-23 DIAGNOSIS — K5903 Drug induced constipation: Secondary | ICD-10-CM

## 2022-05-23 DIAGNOSIS — C7800 Secondary malignant neoplasm of unspecified lung: Secondary | ICD-10-CM | POA: Insufficient documentation

## 2022-05-23 LAB — CBC WITH DIFFERENTIAL (CANCER CENTER ONLY)
Abs Immature Granulocytes: 0.01 10*3/uL (ref 0.00–0.07)
Basophils Absolute: 0 10*3/uL (ref 0.0–0.1)
Basophils Relative: 0 %
Eosinophils Absolute: 0 10*3/uL (ref 0.0–0.5)
Eosinophils Relative: 0 %
HCT: 38.5 % — ABNORMAL LOW (ref 39.0–52.0)
Hemoglobin: 12.9 g/dL — ABNORMAL LOW (ref 13.0–17.0)
Immature Granulocytes: 0 %
Lymphocytes Relative: 23 %
Lymphs Abs: 1.2 10*3/uL (ref 0.7–4.0)
MCH: 32.8 pg (ref 26.0–34.0)
MCHC: 33.5 g/dL (ref 30.0–36.0)
MCV: 98 fL (ref 80.0–100.0)
Monocytes Absolute: 0.3 10*3/uL (ref 0.1–1.0)
Monocytes Relative: 7 %
Neutro Abs: 3.5 10*3/uL (ref 1.7–7.7)
Neutrophils Relative %: 70 %
Platelet Count: 192 10*3/uL (ref 150–400)
RBC: 3.93 MIL/uL — ABNORMAL LOW (ref 4.22–5.81)
RDW: 12.9 % (ref 11.5–15.5)
WBC Count: 5 10*3/uL (ref 4.0–10.5)
nRBC: 0 % (ref 0.0–0.2)

## 2022-05-23 LAB — CMP (CANCER CENTER ONLY)
ALT: 7 U/L (ref 0–44)
AST: 24 U/L (ref 15–41)
Albumin: 4.2 g/dL (ref 3.5–5.0)
Alkaline Phosphatase: 68 U/L (ref 38–126)
Anion gap: 5 (ref 5–15)
BUN: 17 mg/dL (ref 8–23)
CO2: 25 mmol/L (ref 22–32)
Calcium: 9.3 mg/dL (ref 8.9–10.3)
Chloride: 108 mmol/L (ref 98–111)
Creatinine: 0.81 mg/dL (ref 0.61–1.24)
GFR, Estimated: >60 mL/min (ref >60)
Glucose, Bld: 124 mg/dL — ABNORMAL HIGH (ref 70–99)
Potassium: 4.6 mmol/L (ref 3.5–5.1)
Sodium: 138 mmol/L (ref 135–145)
Total Bilirubin: 1.4 mg/dL — ABNORMAL HIGH (ref 0.3–1.2)
Total Protein: 6.9 g/dL (ref 6.5–8.1)

## 2022-05-23 MED ORDER — PREDNISONE 5 MG PO TABS: 5.0000 mg | ORAL_TABLET | Freq: Every day | ORAL | 1 refills | Status: DC

## 2022-05-23 MED ORDER — DENOSUMAB 120 MG/1.7ML ~~LOC~~ SOLN
120.0000 mg | Freq: Once | SUBCUTANEOUS | Status: AC
  Administered 2022-05-23: 120 mg via SUBCUTANEOUS
  Filled 2022-05-23: qty 1.7

## 2022-05-23 MED ORDER — MORPHINE SULFATE ER 15 MG PO TBCR: 15.0000 mg | EXTENDED_RELEASE_TABLET | Freq: Two times a day (BID) | ORAL | 0 refills | Status: DC

## 2022-05-23 MED ORDER — ABIRATERONE ACETATE 250 MG PO TABS
1000.0000 mg | ORAL_TABLET | Freq: Every day | ORAL | 0 refills | Status: DC
  Filled 2022-05-23: qty 120, 30d supply, fill #0

## 2022-05-23 NOTE — Progress Notes (Signed)
Hematology and Oncology Follow Up Visit  Frederick Ponce [id number] 09/28/1946 76 y.o. 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  He presented with PSA of 22.5 at the time of diagnosis.   Prior Therapy: CT-guided biopsy of a pulmonary nodule obtained on January 15, 2022 which confirmed the presence of prostate cancer.  He is status post radiation therapy to the left sacroiliac pelvic bones completed in February 2023.  He received 30 Gray in 10 fractions  Firmagon 240 mg started on January 23, 2022.  Last injection was given on February 20, 2022.  Current therapy:  Eligard 30 mg every 4 months started on March 20, 2022.  Zytiga 1000 mg daily with prednisone 5 mg daily started on March 06, 2022.  Interim History: Frederick Ponce returns today for repeat evaluation.  Since last visit, he reports no major changes in his health.  He continues to tolerate Zytiga without any complications.  He has reported some occasional hot flashes specially at nighttime which are relieved by nighttime morphine.  He denies any bone pain pathological fractures.  He denies any edema or excessive fatigue.  He does report occasional dyspnea.    Medications: Updated on review. Current Outpatient Medications  Medication Sig Dispense Refill   abiraterone acetate (ZYTIGA) 250 MG tablet Take 4 tablets (1,000 mg total) by mouth daily. Take on an empty stomach 1 hour before or 2 hours after a meal 120 tablet 0   atorvastatin (LIPITOR) 40 MG tablet Take 40 mg by mouth daily.     atorvastatin (LIPITOR) 40 MG tablet Take 1 tablet by mouth at bedtime.     calcium-vitamin D (OSCAL WITH D) 500-5 MG-MCG tablet Take 1 tablet by mouth 2 (two) times daily. 60 tablet 3   carbidopa-levodopa (SINEMET) 25-100 MG tablet Take 1.5 tablets by mouth  daily. 135 tablet 3   co-enzyme Q-10 30 MG capsule Take 30 mg by mouth 3 (three) times daily.     diphenhydrAMINE (BENADRYL) 25 MG tablet Take 1 tablet (25 mg total) by mouth every 6 (six) hours as needed. 30 tablet 0   EPINEPHrine 0.3 mg/0.3 mL IJ SOAJ injection Inject 0.3 mg into the muscle as needed for anaphylaxis. 1 each 0   ibuprofen (ADVIL) 200 MG tablet 1 tablet with food or milk as needed     KRILL OIL PO Take by mouth.     lidocaine (LIDODERM) 5 % Place 1 patch onto the skin daily. Remove & Discard patch within 12 hours or as directed by MD 30 patch 0   morphine (MS CONTIN) 15 MG 12 hr tablet Take 1 tablet (15 mg total) by mouth at bedtime. 30 tablet 0   multivitamin-lutein (OCUVITE-LUTEIN) CAPS capsule Take 1 capsule by mouth daily.     predniSONE (DELTASONE) 5 MG tablet Take 1 tablet (5 mg total) by mouth daily with breakfast. 90 tablet 1   prochlorperazine (COMPAZINE) 10 MG tablet Take 1 tablet (10 mg total) by mouth every 6 (six) hours as needed for nausea or vomiting. 30 tablet 0   propranolol (INDERAL) 40 MG tablet Take 1 tablet by mouth daily.     vitamin B-12 (CYANOCOBALAMIN) 1000 MCG tablet Take 1,000 mcg by mouth daily.     No current facility-administered medications for this visit.     Allergies: No Known Allergies    Physical Exam:  Blood pressure 123/81, pulse 77, temperature 98.1 F (36.7 C), temperature source Temporal, resp. rate 15, weight 170 lb 8 oz (77.3 kg), SpO2 99 %.   ECOG: 1   General appearance: Comfortable appearing without any discomfort Head: Normocephalic without any trauma Oropharynx: Mucous membranes are moist and pink without any thrush or ulcers. Eyes: Pupils are equal and round reactive to light. Lymph nodes: No cervical, supraclavicular, inguinal or axillary lymphadenopathy.   Heart:regular rate and rhythm.  S1 and S2 without leg edema. Lung: Clear without any rhonchi or wheezes.  No dullness to percussion. Abdomin: Soft, nontender,  nondistended with good bowel sounds.  No hepatosplenomegaly. Musculoskeletal: No joint deformity or effusion.  Full range of motion noted. Neurological: No deficits noted on motor, sensory and deep tendon reflex exam. Skin: No petechial rash or dryness.  Appeared moist.      Lab Results: Lab Results  Component Value Date   WBC 5.2 04/03/2022   HGB 11.9 (L) 04/03/2022   HCT 36.3 (L) 04/03/2022   MCV 99.5 04/03/2022   PLT 181 04/03/2022     Chemistry      Component Value Date/Time   NA 140 04/03/2022 0847   NA 140 09/14/2021 0920   K 3.7 04/03/2022 0847   CL 109 04/03/2022 0847   CO2 24 04/03/2022 0847   BUN 13 04/03/2022 0847   BUN 18 09/14/2021 0920   CREATININE 0.74 04/03/2022 0847      Component Value Date/Time   CALCIUM 9.2 04/03/2022 0847   ALKPHOS 110 04/03/2022 0847   AST 18 04/03/2022 0847   ALT 8 04/03/2022 0847   BILITOT 1.0 04/03/2022 0847       Latest Reference Range & Units 03/13/22 08:55 04/03/22 08:47  Prostate Specific Ag, Serum 0.0 - 4.0 ng/mL 0.3 <0.1     Impression and Plan:  76 year old with:  1.  Advanced prostate cancer with disease to the bone and pulmonary disease diagnosed in January 2023.  He has castration-sensitive disease at this time.   The natural course of his disease was updated at this time.  He is currently on Zytiga with excellent PSA response that is currently undetectable after started definitive therapy.  Risks and benefits of continuing this treatment were discussed at this time.  Other salvage options including Taxotere chemotherapy has been deferred for the time being.  After discussion today he is agreeable to proceed and we will update his staging scan before the next visit.     2.  Bone directed therapy: He is currently on Xgeva which will be repeated today and every 2 months.  Complications include osteonecrosis of the jaw and hypocalcemia were reviewed.     3.  Parkinson's disease: He continues to follow with  neurology without any further clinical decline.  4.  Androgen deprivation therapy: He continues to be on Eligard which will be repeated in 2 months.   5.  Bone pain: Manageable with nighttime morphine.   6.  Electrolyte imbalance and hypertension: His potassium and liver function tests will continue to be monitored at this time.  No issues reported   7.  Follow-up: In 8 weeks to repeat evaluation.       30  minutes were spent on this encounter.  The time was dedicated to reviewing laboratory data, disease status update and outlining future plan of care discussion.   Zola Button, MD 5/31/20231:08 PM

## 2022-05-24 ENCOUNTER — Telehealth: Payer: Self-pay | Admitting: *Deleted

## 2022-05-24 ENCOUNTER — Encounter: Payer: Self-pay | Admitting: Oncology

## 2022-05-24 ENCOUNTER — Encounter: Payer: Self-pay | Admitting: Nurse Practitioner

## 2022-05-24 LAB — PROSTATE-SPECIFIC AG, SERUM (LABCORP): Prostate Specific Ag, Serum: <0.1 ng/mL (ref 0.0–4.0)

## 2022-05-24 NOTE — Telephone Encounter (Signed)
-----   Message from Wyatt Portela, MD sent at 05/24/2022  4:08 PM EDT ----- Please let him know his PSA is still low

## 2022-05-24 NOTE — Progress Notes (Signed)
[place]  [phone]) [phone] [phone]) [phone]   Name: Frederick Ponce Date: 05/24/2022 MRN: [id number]  DOB: 1946-06-14  Patient Care Team: [facility], [facility] as PCP - General Pickenpack-Cousar, Carlena Sax, NP as Nurse Practitioner (Nurse Practitioner)   REASON FOR CONSULTATION: Frederick Ponce is a 76 y.o. male with medical history including Parkinson's disease and hyperlipidemia. 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 He is having urgent radiation appointment due to iliac bone pain. Palliative ask to see for symptom management.    SOCIAL HISTORY:     reports that he has never smoked. He has never used smokeless tobacco. He reports current alcohol use of about 21.0 standard drinks per week. He reports that he does not use drugs.  ADVANCE DIRECTIVES:  Patient is scheduled to complete advanced directives at our clinic here at the cancer center on 02/16/2022. MOST form provided.  Patient plans to review and complete at a later time.   CODE STATUS:   PAST MEDICAL HISTORY: Past Medical History:  Diagnosis Date   Hyperlipidemia    Parkinson disease ([place])     PAST SURGICAL HISTORY: History reviewed. No pertinent surgical history.  HEMATOLOGY/ONCOLOGY HISTORY:  Oncology History  Prostate 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 ([place])  01/18/2022 Initial Diagnosis   Prostate 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 ([place])    02/20/2022 - 02/20/2022 Chemotherapy   Patient is on Treatment Plan : PROSTATE Docetaxel q21d      05/23/2022 Cancer Staging   Staging form: Prostate, AJCC 8th Edition - Clinical: Stage IVB (cTX, cNX, cM1c) - Signed by Wyatt Portela, MD on 05/23/2022      ALLERGIES:  has No Known Allergies.  MEDICATIONS:  Current Outpatient Medications  Medication Sig Dispense Refill   abiraterone acetate (ZYTIGA) 250 MG tablet  Take 4 tablets (1,000 mg total) by mouth daily. Take on an empty stomach 1 hour before or 2 hours after a meal 120 tablet 0   atorvastatin (LIPITOR) 40 MG tablet Take 40 mg by mouth daily.     atorvastatin (LIPITOR) 40 MG tablet Take 1 tablet by mouth at bedtime.     calcium-vitamin D (OSCAL WITH D) 500-5 MG-MCG tablet Take 1 tablet by mouth 2 (two) times daily. 60 tablet 3   carbidopa-levodopa (SINEMET) 25-100 MG tablet Take 1.5 tablets by mouth daily. 135 tablet 3   co-enzyme Q-10 30 MG capsule Take 30 mg by mouth 3 (three) times daily.     diphenhydrAMINE (BENADRYL) 25 MG tablet Take 1 tablet (25 mg total) by mouth every 6 (six) hours as needed. 30 tablet 0   EPINEPHrine 0.3 mg/0.3 mL IJ SOAJ injection Inject 0.3 mg into the muscle as needed for anaphylaxis. 1 each 0   ibuprofen (ADVIL) 200 MG tablet 1 tablet with food or milk as needed     KRILL OIL PO Take by mouth.     lidocaine (LIDODERM) 5 % Place 1 patch onto the skin daily. Remove & Discard patch within 12 hours or as directed by MD 30 patch 0   morphine (MS CONTIN) 15 MG 12 hr tablet Take 1 tablet (15 mg total) by mouth every 12 (twelve) hours. 60 tablet 0   multivitamin-lutein (OCUVITE-LUTEIN) CAPS capsule Take 1 capsule by mouth daily.     predniSONE (DELTASONE) 5 MG tablet Take 1 tablet (5 mg total) by mouth daily with breakfast. 90 tablet 1  prochlorperazine (COMPAZINE) 10 MG tablet Take 1 tablet (10 mg total) by mouth every 6 (six) hours as needed for nausea or vomiting. 30 tablet 0   propranolol (INDERAL) 40 MG tablet Take 1 tablet by mouth daily.     vitamin B-12 (CYANOCOBALAMIN) 1000 MCG tablet Take 1,000 mcg by mouth daily.     No current facility-administered medications for this visit.    PERFORMANCE STATUS (ECOG) : 1 - Symptomatic but completely ambulatory  Assessment No acute distress, well developed Normal breathing pattern AAO x3, mood appropriate    IMPRESSION:  Frederick Ponce presents to clinic today for symptom  management follow-up. No acute distress noted. Husband Frederick Ponce is with him for support. Ambulatory with no assistive devices. He and Frederick Ponce recently had a social event at his home that went really well. Looking forward to next event. Expresses concerns with activities and socializing due to fear of pain/hot flash episodes which are uncontrollable and quite severe when it occurs.   Neoplasm related pain/hot flashes Frederick Ponce continues to take his MS Contin at bedtime for pain and discomfort. He shares he is in pain constantly however trying to manage. We were initially able to wean down MS Contin to once daily however given ongoing pain and discomfort recommended restarting trial of twice daily with a goal of better control. He and Frederick Ponce verbalized understanding. If he does not see much improvement we can return back to once daily.   Constipation  Constipation in the setting of MS Contin. Encouraged continued use of Miralax daily. He knows if no bowel movement within 48hrs he may increase to twice daily.    PLAN: Increase MS Contin 15 mg every 12 hours  Tylenol extra strength 3 times a day as needed Prune juice, tea, MiraLAX daily for bowel regimen  We will plan to have phone follow-up in 2 weeks for symptom follow-up. I will see patient back in 6-8 weeks in collaboration to other oncology appointments.   Patient expressed understanding and was in agreement with this plan. He also understands that He can call the clinic at any time with any questions, concerns, or complaints.   Number and complexity of problems addressed: 2 HIGH - 1 or more chronic illnesses with SEVERE exacerbation, progression, or side effects of treatment - advanced cancer, pain. Any controlled substances utilized were prescribed in the context of palliative care.   Time Total: 40 min  Visit consisted of counseling and education dealing with the complex and emotionally intense issues of symptom management and palliative care in the  setting of serious and potentially life-threatening illness.Greater than 50%  of this time was spent counseling and coordinating care related to the above assessment and plan.  Alda Lea, AGPCNP-BC  Palliative Medicine Team/[facility] [place]

## 2022-05-24 NOTE — Telephone Encounter (Signed)
LM with note below 

## 2022-05-31 ENCOUNTER — Telehealth: Payer: Self-pay | Admitting: Oncology

## 2022-05-31 NOTE — Telephone Encounter (Signed)
.  Called patient to schedule appointment per 8/8 inbasket, patient is aware of date and time.

## 2022-06-01 ENCOUNTER — Telehealth: Payer: Self-pay | Admitting: Oncology

## 2022-06-01 NOTE — Telephone Encounter (Signed)
Called patient regarding upcoming appointment, left a voicemail. 

## 2022-06-04 ENCOUNTER — Other Ambulatory Visit: Payer: Self-pay | Admitting: Nurse Practitioner

## 2022-06-04 ENCOUNTER — Telehealth: Payer: Self-pay

## 2022-06-04 ENCOUNTER — Other Ambulatory Visit: Payer: Self-pay | Admitting: Physician Assistant

## 2022-06-04 ENCOUNTER — Telehealth: Payer: Self-pay | Admitting: *Deleted

## 2022-06-04 ENCOUNTER — Telehealth: Payer: Self-pay | Admitting: Physician Assistant

## 2022-06-04 DIAGNOSIS — G893 Neoplasm related pain (acute) (chronic): Secondary | ICD-10-CM

## 2022-06-04 DIAGNOSIS — Z515 Encounter for palliative care: Secondary | ICD-10-CM

## 2022-06-04 DIAGNOSIS — C61 Malignant neoplasm of prostate: Secondary | ICD-10-CM

## 2022-06-04 MED ORDER — MORPHINE SULFATE 15 MG PO TABS: 15.0000 mg | ORAL_TABLET | ORAL | 0 refills | Status: DC | PRN

## 2022-06-04 NOTE — Telephone Encounter (Signed)
PC to patient, advised him his MRI is scheduled for tomorrow - 06/05/22 at 3:00 at [facility], patient is to arrive at 2:30, he verbalizes understanding.

## 2022-06-04 NOTE — Telephone Encounter (Signed)
RN called pt to discuss increased pain, pt reported that his 12hr MS Contin is no longer effective for his increased pain, it started on Friday in his left lower back, radiating down his leg. Pt reports trying to take over the counter meds and finding no relief. See new orders for MS IR, this RN explained how to take medication and how to space it apart form his MS Contin. Pt verbalized understanding, has no further needs at this time.

## 2022-06-04 NOTE — Telephone Encounter (Signed)
Frederick Ponce called me today to share his symptoms over the last 3 days. He reports recurrent sharp back pain (similar to when he initially presented with his prostate cancer diagnosis) radiating down his left leg. He is currently taking MS contin 15 mg q 12 hours but he has breakthrough pain which was severe. He is unable to sleep as supine position makes it worse.   Discussed symptoms with Dr. Alen Blew who recommends obtaining a STAT MRI of the thoracic and lumbar spine. Additionally, palliative care team will follow up to discuss current pain regimen.   He denies any bowel incontinence, urinary symptoms or saddle paresthesias. He is aware to go to the ER if pain becomes worse or he has the above symptoms.   Patient expressed understanding of the plan provided.

## 2022-06-05 ENCOUNTER — Ambulatory Visit (HOSPITAL_COMMUNITY)
Admission: RE | Admit: 2022-06-05 | Discharge: 2022-06-05 | Disposition: A | Discharge status: Home / Self Care | Payer: Medicare Other | Source: Ambulatory Visit | Attending: Physician Assistant | Admitting: Physician Assistant

## 2022-06-05 ENCOUNTER — Telehealth: Payer: Self-pay

## 2022-06-05 DIAGNOSIS — C61 Malignant neoplasm of prostate: Secondary | ICD-10-CM | POA: Insufficient documentation

## 2022-06-05 DIAGNOSIS — C7951 Secondary malignant neoplasm of bone: Secondary | ICD-10-CM | POA: Insufficient documentation

## 2022-06-05 MED ORDER — GADOBUTROL 1 MMOL/ML IV SOLN
8.0000 mL | Freq: Once | INTRAVENOUS | Status: AC | PRN
Start: 2022-06-05 — End: 2022-06-05
  Administered 2022-06-05: 8 mL via INTRAVENOUS

## 2022-06-05 NOTE — Telephone Encounter (Signed)
Pt called regarding same new pain from yesterday reporting that the new medications did not relieve his pain. Per Lexine Baton, NP we will wait for pt scan to result today, pt in agreement. No further needs at this time.

## 2022-06-06 ENCOUNTER — Other Ambulatory Visit: Payer: Self-pay | Admitting: Oncology

## 2022-06-06 ENCOUNTER — Telehealth: Payer: Self-pay | Admitting: *Deleted

## 2022-06-06 ENCOUNTER — Telehealth: Payer: Self-pay | Admitting: Radiation Oncology

## 2022-06-06 DIAGNOSIS — C61 Malignant neoplasm of prostate: Secondary | ICD-10-CM

## 2022-06-06 NOTE — Telephone Encounter (Signed)
Frederick Ponce left a message asking about the scan yesterday, wants to know what is next. States he is still having a lot of pain.

## 2022-06-06 NOTE — Telephone Encounter (Signed)
Called patient to schedule a consultation w. Dr. Manning. No answer, LVM for a return call.  ?

## 2022-06-06 NOTE — Telephone Encounter (Signed)
Notified of message below

## 2022-06-06 NOTE — Telephone Encounter (Signed)
Called patient to schedule a CT sim with Dr. Tammi Klippel. No answer, LVM for a return call.

## 2022-06-07 ENCOUNTER — Encounter: Payer: Self-pay | Admitting: Neurology

## 2022-06-07 ENCOUNTER — Ambulatory Visit (INDEPENDENT_AMBULATORY_CARE_PROVIDER_SITE_OTHER): Payer: Medicare Other | Admitting: Neurology

## 2022-06-07 VITALS — BP 137/77 | HR 72 | Ht 68.0 in | Wt 170.0 lb

## 2022-06-07 DIAGNOSIS — K5909 Other constipation: Secondary | ICD-10-CM

## 2022-06-07 DIAGNOSIS — M5440 Lumbago with sciatica, unspecified side: Secondary | ICD-10-CM | POA: Diagnosis not present

## 2022-06-07 DIAGNOSIS — G8929 Other chronic pain: Secondary | ICD-10-CM

## 2022-06-07 DIAGNOSIS — F439 Reaction to severe stress, unspecified: Secondary | ICD-10-CM | POA: Diagnosis not present

## 2022-06-07 DIAGNOSIS — G20 Parkinson's disease: Secondary | ICD-10-CM

## 2022-06-07 DIAGNOSIS — G250 Essential tremor: Secondary | ICD-10-CM

## 2022-06-07 DIAGNOSIS — C44619 Basal cell carcinoma of skin of left upper limb, including shoulder: Secondary | ICD-10-CM | POA: Insufficient documentation

## 2022-06-07 NOTE — Progress Notes (Signed)
Histology and Location of Primary Cancer: Metastatic Prostate cancer with bilateral lung nodules, pelvic osseous lesions, and hilar adenopathy  Sites of Visceral and Bony Metastatic Disease: Iliac bone  Location(s) of Symptomatic Metastases: Recurrent sharp back pain radiates down left leg   CT Chest Abdomen Pelvis with Contrast 01/04/2022  CLINICAL DATA:  left iliac bone abnormality on outside MRI. Evaluate for metastatic disease and targeted biopsy. Shortness of breath with constipation. Left hip and back pain.   FINDINGS: CT CHEST FINDINGS   Cardiovascular: Aortic atherosclerosis. Tortuous thoracic aorta.  Mild cardiomegaly, without pericardial effusion. Lad coronary artery calcification. No central pulmonary embolism, on this non-dedicated study.   Mediastinum/Nodes: No supraclavicular adenopathy. No axillary adenopathy. Right hilar adenopathy at 2.3 x 2.0 cm on 25/2. No mediastinal adenopathy.   Lungs/Pleura: No pleural fluid. Innumerable bilateral pulmonary nodules.   Index right upper lobe 11 mm nodule on 40/4.   A left upper lobe pulmonary nodule of 1.2 cm on 49/4.   Left lower lobe pulmonary nodule of 1.7 x 1.4 cm on 96/4.   Musculoskeletal: No acute osseous abnormality.   CT ABDOMEN PELVIS FINDINGS   Hepatobiliary: High right hepatic lobe 1.0 cm hypoattenuating lesion on 46/2 is favored to represent hemangioma, including peripheral nodular enhancement on 47/2.   More caudal right hepatic lobe lesion is too small to characterize at approximately 4 mm. Normal gallbladder, without biliary ductal dilatation.   Pancreas: Normal, without mass or ductal dilatation.   Spleen: Normal in size, without focal abnormality.   Adrenals/Urinary Tract: Normal adrenal glands. Bilateral renal collecting system calculi, including a dominant stone within the right renal pelvis of 12 mm. Moderate right-sided caliectasis without hydroureter. Normal urinary bladder.   Stomach/Bowel: Normal  stomach, without wall thickening.  Nonobstructive sigmoid colon positioned within a moderate right inguinal hernia including on 115/2. No findings of strangulation.  Normal terminal ileum and appendix. Normal small bowel.   Vascular/Lymphatic: Aortic atherosclerosis. No abdominopelvic adenopathy.   Reproductive: Moderate prostatomegaly with subtle nodule or node just posterior to the prostatic base at 5 mm on 102/2. Heterogeneous prostatic enhancement with subtle ill definition of prostatic capsule, including right of midline on 104/2.   Other: No significant free fluid. No evidence of omental or peritoneal disease.   Musculoskeletal: Left sacral, iliac, and parasymphyseal bilateral pubic relatively diffuse sclerosis.   IMPRESSION: CT CHEST IMPRESSION   1. Widespread pulmonary metastasis. 2. Right hilar adenopathy, likely representing nodal metastasis. 3. No primary malignancy identified within the chest. 4. Coronary artery atherosclerosis. Aortic Atherosclerosis (ICD10-I70.0).   CT ABDOMEN AND PELVIS IMPRESSION   1. Extensive pelvic sclerotic osseous metastasis. 2. Prostatomegaly with ill definition of prostatic capsule and subtle periprosthetic nodule or node. Suspect primary prostate carcinoma. Correlate with physical exam and PSA. The patient's nodal and pulmonary metastasis may represent an aggressive appearance of prostate primary or a synchronous second primary. PMSA PET may be informative. 3. Bilateral nephrolithiasis with right-sided caliectasis but no hydroureter. Favor chronic ureteropelvic junction obstruction. 4. Nonobstructive sigmoid colon containing right inguinal hernia.   MR Thoracic Spine with/without Contrast 06/05/2022 CLINICAL DATA:  Acute back pain radiating to left leg, metastatic prostate cancer  FINDINGS: MRI THORACIC SPINE FINDINGS   Alignment: There is exaggerated thoracic lordosis. There is no antero or retrolisthesis.   Vertebrae: There is a 1.0 cm focus  of T1/T2 hypointensity in the T7 vertebral body without enhancement corresponding to a densely sclerotic lesion seen on the prior CT favored to reflect a benign bone island.  There is also nonenhancing T1 and T2 hypointensity in the adjacent left seventh rib suspicious for metastatic disease given the appearance on the prior CT. A separate focus of T1 and T2 hyperintensity in the T7 vertebral body is favored to reflect a benign hemangioma. An additional probable hemangioma is seen in the T3 vertebral body.   There are no other suspicious lesions.  There is no epidural tumor.   Vertebral body heights are preserved, without evidence of acute injury. There are flowing anterior osteophytes throughout the thoracic spine consistent with diffuse idiopathic skeletal hyperostosis.   Cord: Normal in signal and morphology. There is no abnormal cord enhancement.   Paraspinal and other soft tissues: The paraspinal soft tissues are unremarkable. The known pulmonary metastatic disease is not well assessed.   Disc levels:   The disc heights in the thoracic spine are overall preserved. There is mild degenerative endplate change and facet arthropathy. There is no significant spinal canal or neural foraminal stenosis, and no evidence of cord or nerve root compression.   MRI LUMBAR SPINE FINDINGS   Segmentation: Standard; the lowest formed disc space is designated L5-S1.   Alignment:  Normal.   Vertebrae: Vertebral body heights are preserved. There is no evidence of acute fracture.   There is STIR signal abnormality with enhancement in the posterior aspect of the L1 vertebral body which is favored to be degenerative in nature when correlated with the prior CT.   Enhancing metastatic disease is seen in the imaged sacrum and left iliac bone (19-3). Additional metastatic disease is seen in the left L5 pedicle (13-3, 11-3). The lesion at L5 is increased in size/conspicuity since 12/07/2021. There is no evidence  of epidural tumor.   Fatty marrow signal in the lower lumbar spine and sacrum likely reflects sequela of prior radiation therapy.   Conus medullaris: Extends to the L1 level and appears normal. There is no abnormal enhancement of the cauda equina nerve roots.   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   Disc levels:   There is essentially obliteration of the disc space at L5-S1 and disc desiccation and narrowing throughout the remainder of the lumbar spine.   T12-L1: No significant spinal canal or neural foraminal stenosis   L1-L2: There is a mild disc bulge and bilateral facet arthropathy resulting in mild spinal canal stenosis and mild left worse than right neural foraminal stenosis, not significantly changed.   L2-L3: There is a mild disc bulge and bilateral facet arthropathy resulting in mild spinal canal stenosis without significant neural foraminal stenosis, not significantly changed.   L3-L4: There is mild disc bulge and bilateral facet arthropathy resulting in mild bilateral neural foraminal stenosis without significant spinal canal stenosis, not significantly changed.   L4-L5: There is a diffuse disc bulge eccentric to the left and moderate bilateral facet arthropathy resulting in moderate spinal canal stenosis with effacement of the left subarticular zone and possible impingement of the traversing L5 nerve root and moderate to severe left and mild right neural foraminal stenosis, unchanged   L5-S1: Degenerative endplate change and bilateral facet arthropathy without significant spinal canal or neural foraminal stenosis.   IMPRESSION: 1. Osseous metastatic disease in the sacrum and left iliac bone, left L5 pedicle, and left seventh rib. The lesion at L5 is increased in size and conspicuity since 12/07/2021. 2. No evidence of  epidural tumor  or acute injury in the thoracolumbar spine.  3. Signal abnormality in the L1 vertebral body is favored to be degenerative in nature; attention on any follow-up studies. A sclerotic lesion in the T7 vertebral body is favored to reflect a benign bone island. 4. Degenerative changes in the lumbar spine most advanced at L4-L5 where there is moderate spinal canal stenosis with effacement of the left subarticular zone and possible impingement of the traversing L5 nerve root and moderate to severe left neural foraminal stenosis, overall not significantly changed. 5. No significant spinal canal or neural foraminal stenosis in the thoracic spine. 6. 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   Past/Anticipated chemotherapy by medical oncology, if any:  NA  Pain on a scale of 0-10 is: 2/10 left leg    If Spine Met(s), symptoms, if any, include: Bowel/Bladder retention or incontinence (please describe): Constipation from medication. Numbness or weakness in extremities (please describe): Left leg weakness Current Decadron regimen, if applicable: No  Ambulatory status? Walker? Wheelchair?:   SAFETY ISSUES: Prior radiation? Yes Pacemaker/ICD? No Possible current pregnancy? Male Is the patient on methotrexate? No  Current Complaints / other details:

## 2022-06-07 NOTE — Patient Instructions (Signed)
It was nice to see you both again today.  I hope everything goes well with your appointment with the radiation oncologist.  As discussed, I would like to increase your levodopa to 1 pill 3 times daily at 7 AM, 11 AM and 3 PM daily.  Please try to stay well-hydrated and well rested and limit your caffeine and alcohol intake.  Please follow up in 4 months, sooner if needed

## 2022-06-07 NOTE — Progress Notes (Signed)
Subjective:    Patient ID: Frederick Ponce is a 76 y.o. male.  HPI    Interim history:   [name] is a 76 year old right-handed gentleman with an underlying medical history of hyperlipidemia, prostate cancer and overweight state, who presents for follow-up consultation of his parkinsonism.  The patient is accompanied by his partner [name] again today.  I first met him on 09/14/2021, at which time he reported a longstanding history of tremors as well as a strong family history of tremors.  Usual combination of propranolol once daily at night and generic Sinemet 1-1/2 pills at night.  He was maintained on his medication regimen, I suggested we proceed with a DaTscan for better diagnostic clarification. He had a DaTscan on 10/24/2021 and I reviewed the results: IMPRESSION: Bilateral reduced activity in posterior striata is a pattern suggestive of Parkinsonian syndrome pathology.   Of note, DaTSCAN is not diagnostic of Parkinsonian syndromes, which remains a clinical diagnosis. DaTscan is an adjuvant test to aid in the clinical diagnosis of Parkinsonian syndromes.  He was notified of the test results.  Today, 06/07/2022: He reports not doing too well.  His stress level has increased, understandably so.  He was diagnosed with metastatic prostate cancer in January 2023.  He has undergone radiation therapy and has had recent recurrence of lesions.  He has an appointment to discuss additional radiation therapy.  He is followed by oncology closely.  He has had low back pain with radiation to the left leg.  He has not fallen recently.  Tremor has been fluctuating, some days are worse than others.  He has increased his levodopa to 2 pills total dose daily, takes 1 pill in the morning and 1 in the afternoon.  He has been on morphine for pain control, long-acting as well as short acting.  He has been on Zytiga.  His pain is worse at night.  He wakes up feeling very stiff.  He tries to hydrate well with water.   He takes MiraLAX for constipation.  He does drink alcohol at night, diluted wine, about 2 glasses in the evening.  Previously:   09/14/2021: (He) reports a longstanding history of bilateral upper extremity tremors.  He reports a family history of tremors affecting his father, paternal grandmother, 2 sisters.  One paternal aunt had Parkinson's disease and was on levodopa therapy.  He is currently on propranolol 40 mg at 1 AM at night.  He also takes carbidopa-levodopa 25-100 mg strength 1-1/2 pills at 1 AM at night, he does not take it during the day.  He reports that it is helping, both medications have helped his tremor and his motor function.  He has not had any recent falls.  He does not take his medications during the day as he would forget them.  He has not been on a higher dose of levodopa therapy.  He has not had any significant side effects.  He has an appointment with a new primary care physician in the next couple of weeks. I reviewed your office note from 05/11/2019.  He was complaining of pain in his legs and feet at the time.  He was also complaining of back pain he reported a tremor in his right hand which was longstanding for over 10 years.  He also reported a family history of tremors at the time.  He was advised to proceed with additional work-up through your office including a lumbar spine MRI without contrast, ultrasound for ankle brachial indices, EMG and  nerve conduction velocity testing of both lower extremities.  He was advised to proceed with blood work including B6, CBC with differential, vitamin D, A1c, vitamin a and E, protein electrophoresis, CMP, methylmalonic acid and homocystine.  Test results are not available for my review today.  He had a follow-up appointment on 11/02/2020 and your office.  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  He had a subsequent appointment on 01/25/2021, at which time he reported that he discontinued gabapentin 300 mg 3 times a day.  He felt very fatigued  in the mornings.  He was started on Sinemet 25-100 mg strength 1 pill 3 times daily at the time.  He had an appointment on 06/19/2021, at which time he was on Sinemet 1 pill 3 times a day, 25-100 mg strength.  He had apparently tried gabapentin 100 mg at bedtime but it was no longer on his active list of medications.  He was referred to physical therapy.  He was started on propranolol 40 mg once daily to help with his tremors. We will request test results from your office.  They moved from [place], [place], some 2 weeks ago.  He has relatives close to here.  They have 3 dogs in the household.  His sleep is disrupted.  He tries to hydrate well.  He drinks caffeine in the form of coffee, 1 cup in the morning.  He drinks alcohol daily in the form of 3 diluted glasses of wine on average.   He reports intermittent numbness in his feet, does not indicate that he has been told to reduce his alcohol intake or a connection between his alcohol consumption and neuropathy symptoms.  He recalls that he could not tolerate gabapentin.   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  A small fiber neuropathy cannot be ruled out on the basis of the study. Blood work from 05/15/2019 showed methylmalonic acid 121, homocystine 12.3, glucose 110, BUN 16, creatinine 0.85, alk phos 68, AST 21, ALT 23, A1c 5.6, protein electrophoresis benign, CBC with differential and platelets benign, vitamin D 17.6, within range, vitamin B6 18.9, within range, vitamin D 64, within range, vitamin D 46.     MRI brain without contrast from 05/22/2019 showed: Impression: No acute intracranial process.  White matter changes are nonspecific, but most likely related to chronic small vessel ischemic changes given the patient's age.  Generalized cerebral  volume loss.  Polyp/mucosal retention cysts within the bilateral maxillary sinuses.  Scattered mucosal thickening of the paranasal sinuses.   He had a lumbar spine MRI without contrast on 05/22/2019 and I reviewed the results: Impression: Patient motion artifact slightly limits evaluation.  Multilevel degenerative changes and mild epidural lipomatosis, worst at L4-L5, where there is mild right and mild to moderate left neuroforaminal narrowing, minimal spinal canal stenosis, lateral recess narrowing, suspected abutment of the bilateral exiting L4 nerve roots and possible abutment of the descending left L5 nerve root.  Please see above for additional details regarding the individual levels.  Bone marrow edema within the right aspect of the T12-L1 endplate/right aspect of the L1 vertebral body is likely on a degenerative basis/reactive, however, on noncontrasted CT of the lumbar spine may be beneficial to better exclude an underlying lesion within the L1.  Bone marrow is heterogeneous diffusely, nonspecific but likely relating to  age-related changes, degenerative changes and/or underlying osteopenia.  Prominence of the right renal pelvis, possibly relating to peri-/parapelvic cysts but incompletely evaluated on this study.  Hydronephrosis is not entirely excluded in the appropriate clinical setting.  Hypointense ovoid focus in this region measuring 1.1 cm, possibly relating to her renal calculus.  Clinical correlation is recommended.  Dedicated imaging is recommended for further evaluation as clinically indicated.  His Past Medical History Is Significant For: Past Medical History:  Diagnosis Date   Hyperlipidemia    Parkinson disease ([place])     His Past Surgical History Is Significant For: History reviewed. No pertinent surgical history.  His Family History Is Significant For: Family History  Problem Relation Age of Onset   Tremor Father    Parkinson's disease Father    Melanoma Sister    Parkinson's  disease Sister    Parkinson's disease Paternal Grandmother     His Social History Is Significant For: Social History   Socioeconomic History   Marital status: Married    Spouse name: Not on file   Number of children: Not on file   Years of education: Not on file   Highest education level: Not on file  Occupational History   Not on file  Tobacco Use   Smoking status: Never   Smokeless tobacco: Never  Vaping Use   Vaping Use: Never used  Substance and Sexual Activity   Alcohol use: Yes    Alcohol/week: 14.0 standard drinks of alcohol    Types: 14 Glasses of wine per week   Drug use: Never   Sexual activity: Not on file  Other Topics Concern   Not on file  Social History Narrative   Not on file   Social Determinants of Health   Financial Resource Strain: Not on file  Food Insecurity: Not on file  Transportation Needs: Not on file  Physical Activity: Not on file  Stress: Not on file  Social Connections: Not on file    His Allergies Are:  Allergies  Allergen Reactions   Gabapentin Other (See Comments)    Restless legs   Celebrex [Celecoxib] Rash  :   His Current Medications Are:  Outpatient Encounter Medications as of 06/07/2022  Medication Sig   abiraterone acetate (ZYTIGA) 250 MG tablet Take 4 tablets (1,000 mg total) by mouth daily. Take on an empty stomach 1 hour before or 2 hours after a meal   atorvastatin (LIPITOR) 40 MG tablet Take 1 tablet by mouth at bedtime.   calcium-vitamin D (OSCAL WITH D) 500-5 MG-MCG tablet Take 1 tablet by mouth 2 (two) times daily.   carbidopa-levodopa (SINEMET) 25-100 MG tablet Take 1.5 tablets by mouth daily.   co-enzyme Q-10 30 MG capsule Take 30 mg by mouth 3 (three) times daily.   ibuprofen (ADVIL) 200 MG tablet as needed for moderate pain.   KRILL OIL PO Take by mouth.   morphine (MS CONTIN) 15 MG 12 hr tablet Take 1 tablet (15 mg total) by mouth every 12 (twelve) hours.   morphine (MSIR) 15 MG tablet Take 1 tablet (15 mg  total) by mouth every 4 (four) hours as needed for severe pain.   multivitamin-lutein (OCUVITE-LUTEIN) CAPS capsule Take 1 capsule by mouth daily.   predniSONE (DELTASONE) 5 MG tablet Take 1 tablet (5 mg total) by mouth daily with breakfast.   prochlorperazine (COMPAZINE) 10 MG tablet Take 1 tablet (10 mg total) by mouth every 6 (six) hours as needed for nausea or vomiting.   propranolol (  INDERAL) 40 MG tablet Take 1 tablet by mouth daily.   vitamin B-12 (CYANOCOBALAMIN) 1000 MCG tablet Take 1,000 mcg by mouth daily.   diphenhydrAMINE (BENADRYL) 25 MG tablet Take 1 tablet (25 mg total) by mouth every 6 (six) hours as needed.   EPINEPHrine 0.3 mg/0.3 mL IJ SOAJ injection Inject 0.3 mg into the muscle as needed for anaphylaxis.   lidocaine (LIDODERM) 5 % Place 1 patch onto the skin daily. Remove & Discard patch within 12 hours or as directed by MD   No facility-administered encounter medications on file as of 06/07/2022.  :  Review of Systems:  Out of a complete 14 point review of systems, all are reviewed and negative with the exception of these symptoms as listed below:  Review of Systems  Neurological:        Pt is here for parkinson's follow up  . Pt just was diagnosed with prostate cancer . Pt has tremors . Pt states when he is stressed his tremors increase . Pt states when he is using the bathroom he has tremors in arms . Pt states due to increase tremors he increase Sinemet 2 tablets daily     Objective:  Neurological Exam  Physical Exam Physical Examination:   Vitals:   06/07/22 1006  BP: 137/77  Pulse: 72    General Examination: The patient is a very pleasant 76 y.o. male in no acute distress. He appears well-developed and well-nourished and well groomed.   HEENT: Normocephalic, atraumatic, pupils are equal, round and reactive to light, extraocular tracking is fairly well-preserved.  Face is symmetric with minimal facial masking, no decrease in eye blink rate.  Neck with mild  nuchal rigidity noted.  Hearing is grossly intact.  He has no lip, neck or jaw tremor.  Airway examination reveals mild mouth dryness.  Tongue protrudes centrally and palate elevates symmetrically.     Chest: Clear to auscultation without wheezing, rhonchi or crackles noted.   Heart: S1+S2+0, regular and normal without murmurs, rubs or gallops noted.    Abdomen: Soft, non-tender and non-distended with normal bowel sounds appreciated on auscultation.   Extremities: There is no pitting edema in the distal lower extremities bilaterally.   Skin: Warm and dry without trophic changes noted.   Musculoskeletal: exam reveals no obvious joint deformities.  Reports left-sided low back pain.   Neurologically:  Mental status: The patient is awake, alert and oriented in all 4 spheres. His immediate and remote memory, attention, language skills and fund of knowledge appear to be better today. Thought process is linear. Mood is normal and affect is normal.  Cranial nerves II - XII are as described above under HEENT exam. In addition: shoulder shrug is normal with equal shoulder height noted. Motor exam: Thin bulk, global strength of 4+ out of 5, no focal weakness, no focal atrophy, has bilateral contractures of the fifth digits and hands.  Tone without significant cogwheeling, but mildly increased in the left upper extremity.  He has an intermittent resting tremor in the left more than right upper extremities.    (On 09/14/2021: On Archimedes spiral drawing he has no significant trembling with the right hand, insecurity and intermittent trembling with the left hand, handwriting is legible, not particularly tremulous or significantly micrographic, on the smaller side.)   He has a mild postural and action tremor in both upper extremities.  No drift, Romberg is not tested for safety concerns.   Fine motor skills and coordination: He has mild difficulty with  finger taps and hand movements and foot taps bilaterally,  left slightly worse than right.   Cerebellar testing: No dysmetria or intention tremor on finger to nose testing. Heel to shin is unremarkable bilaterally. There is no truncal or gait ataxia.  Sensory exam: intact to light touch, vibration and temperature sense in the upper and lower extremities with the exception of decreased temperature sense in the left and right distal lower extremities.   Gait, station and balance: He stands without difficulty, he has no walking aid, posture is mildly stooped for age.  He walks slightly slowly and cautiously, he has a slight decrease in arm swing on the left.  He has a more pronounced tremor in both upper extremities while walking.   Assessment and Plan:    In summary, Gervase Colberg is a very pleasant 76 year old male with an underlying medical history of hyperlipidemia, metastatic prostate cancer with status post XRT, and borderline overweight state, who presents for follow-up consultation of his parkinsonism.  He has a history of hand tremors for many years.  He has also evidence of parkinsonism and his DaTscan from November 2022 was supportive of an underlying Parkinsonian syndrome. 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  He also has a strong family history of tremors in general. He was taking his propranolol and levodopa in the middle of the night which is unusual, also unusual is the lower dose and once daily dosing of immediate release levodopa.  At this juncture, I suggested we increase his levodopa to 1 pill 3 times daily.  He is advised to take it at 7 AM, 11 AM, and 3 PM daily.  He is agreeable.  We again talked about his daily  alcohol consumption.  He does not drink heavily but has been drinking on a daily basis (albeit diluted wine).  He is advised to follow-up in 4 months, sooner if needed.  I answered all their questions today and the patient and his partner were in agreement. I spent 40 minutes in total face-to-face time and in reviewing records during pre-charting, more than 50% of which was spent in counseling and coordination of care, reviewing test results, reviewing medications and treatment regimen and/or in discussing or reviewing the diagnosis of tremor, parkinsonism, the prognosis and treatment options. Pertinent laboratory and imaging test results that were available during this visit with the patient were reviewed by me and considered in my medical decision making (see chart for details).

## 2022-06-08 ENCOUNTER — Other Ambulatory Visit: Payer: Self-pay

## 2022-06-08 ENCOUNTER — Ambulatory Visit
Admission: RE | Admit: 2022-06-08 | Discharge: 2022-06-08 | Disposition: A | Discharge status: Home / Self Care | Payer: Medicare Other | Source: Ambulatory Visit | Attending: Radiation Oncology | Admitting: Radiation Oncology

## 2022-06-08 VITALS — BP 128/67 | HR 92 | Temp 96.0°F | Resp 18 | Ht 68.0 in | Wt 169.2 lb

## 2022-06-08 DIAGNOSIS — C7800 Secondary malignant neoplasm of unspecified lung: Secondary | ICD-10-CM | POA: Diagnosis present

## 2022-06-08 DIAGNOSIS — G20 Parkinson's disease: Secondary | ICD-10-CM | POA: Diagnosis not present

## 2022-06-08 DIAGNOSIS — C779 Secondary and unspecified malignant neoplasm of lymph node, unspecified: Secondary | ICD-10-CM | POA: Diagnosis not present

## 2022-06-08 DIAGNOSIS — Z79899 Other long term (current) drug therapy: Secondary | ICD-10-CM | POA: Insufficient documentation

## 2022-06-08 DIAGNOSIS — E785 Hyperlipidemia, unspecified: Secondary | ICD-10-CM | POA: Diagnosis not present

## 2022-06-08 DIAGNOSIS — C7951 Secondary malignant neoplasm of bone: Secondary | ICD-10-CM | POA: Insufficient documentation

## 2022-06-08 DIAGNOSIS — Z923 Personal history of irradiation: Secondary | ICD-10-CM | POA: Insufficient documentation

## 2022-06-08 DIAGNOSIS — K5900 Constipation, unspecified: Secondary | ICD-10-CM | POA: Insufficient documentation

## 2022-06-08 DIAGNOSIS — G893 Neoplasm related pain (acute) (chronic): Secondary | ICD-10-CM | POA: Insufficient documentation

## 2022-06-08 DIAGNOSIS — C61 Malignant neoplasm of prostate: Secondary | ICD-10-CM

## 2022-06-08 NOTE — Progress Notes (Addendum)
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    ICD-10-CM   1. Prostate 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 [facility]  C61    C79.51       HISTORY OF PRESENT ILLNESS: Frederick Ponce is a 76 y.o. male seen at the request of Dr. Alen Blew.   In summary, he had been having low back pain and pelvic pain for 3 to 4 months so he presented to his PCP at [facility] and had an MRI of the lumbar spine on 12/07/2021 which showed sclerotic lesions at S1, S2 and in the left iliac bone, suspicious for metastatic disease.  He was referred to medical oncology and seen by Dede Query, PA-C on 12/27/2021.  The pain in the low back radiated down into the left leg and had remained persistent despite multiple interventions including chiropractic, oral steroids, steroid injections and oral pain medications such as Mobic, Vicodin and ibuprofen.  He denied any paresthesias or focal weakness in the lower extremities and no bowel or bladder issues.  A PSA performed that day was elevated at 22.5.  A CT C/A/P was performed for disease staging and this showed widespread, extensive sclerotic lesions and pulmonary involvement with hilar lymphadenopathy.  A CT biopsy of the left iliac bone was performed on 01/15/2022 and confirmed metastatic prostate cancer, poorly differentiated.  He met with Dr. Alen Blew on 01/23/2022 and started ADT and Zytiga/prednisone at that visit.  We met with him on 01/25/2022 to discuss palliative radiation to the painful disease in the pelvis/sacrum.  He elected to proceed with 10 fractions to the left iliac and sacrum  completed on 02/13/2022 with complete resolution of the pain.  His PSA decreased to 0.3 from 22.5.  Systemic chemotherapy was recommended but the patient declined and has continued on ADT and Zytiga/prednisone.  His PSA has been undetectable since 04/03/2022 and he was doing well, pain-free.  More recently, this past week he had a return of severe low back pain radiating into the left lower extremity which has remained persistent and progressive.  The pain does not respond to oral morphine but more recently, the addition of a Tylenol arthritis has seemed to help but at least allow him to get some rest at night.  He denies any focal weakness or paresthesias and has not had any recent changes in bowel or bladder function.  He had a recent MRI of the thoracic and lumbar spine which shows disease progression at L5 which is likely the source of the patient's symptoms.  He has known disease in a left seventh rib, the sacrum and left iliac bone appear stable.  He has kindly been referred back to [place] today to discuss the role of palliative radiotherapy to treat the painful site of osseous metastatic disease in the lumbar spine.  PREVIOUS RADIATION THERAPY: Yes- 01/31/22 - 02/13/22:   The painful lesions in the left iliac and sacral bones were treated to 30 Gy in 10 fractions of 3 Gy each.   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  FAMILY HISTORY:  Family History  Problem Relation Age of Onset   Tremor Father    Parkinson's disease Father    Melanoma Sister    Parkinson's disease Sister    Parkinson's disease Paternal Grandmother     SOCIAL HISTORY:  Social History   Socioeconomic History   Marital status: Married    Spouse name: Not on file   Number of children: Not on file   Years of education: Not on file   Highest education level: Not on file  Occupational History    Not on file  Tobacco Use   Smoking status: Never   Smokeless tobacco: Never  Vaping Use   Vaping Use: Never used  Substance and Sexual Activity   Alcohol use: Yes    Alcohol/week: 14.0 standard drinks of alcohol    Types: 14 Glasses of wine per week   Drug use: Never   Sexual activity: Not on file  Other Topics Concern   Not on file  Social History Narrative   Not on file   Social Determinants of Health   Financial Resource Strain: Not on file  Food Insecurity: Not on file  Transportation Needs: Not on file  Physical Activity: Not on file  Stress: Not on file  Social Connections: Not on file  Intimate Partner Violence: Not on file    ALLERGIES: Gabapentin and Celebrex [celecoxib]  MEDICATIONS:  Current Outpatient Medications  Medication Sig Dispense Refill   abiraterone acetate (ZYTIGA) 250 MG tablet Take 4 tablets (1,000 mg total) by mouth daily. Take on an empty stomach 1 hour before or 2 hours after a meal 120 tablet 0   atorvastatin (LIPITOR) 40 MG tablet Take 1 tablet by mouth at bedtime.     calcium-vitamin D (OSCAL WITH D) 500-5 MG-MCG tablet Take 1 tablet by mouth 2 (two) times daily. 60 tablet 3   carbidopa-levodopa (SINEMET) 25-100 MG tablet Take 1.5 tablets by mouth daily. 135 tablet 3   co-enzyme Q-10 30 MG capsule Take 30 mg by mouth 3 (three) times daily.     diphenhydrAMINE (BENADRYL) 25 MG tablet Take 1 tablet (25 mg total) by mouth every 6 (six) hours as needed. 30 tablet 0   EPINEPHrine 0.3 mg/0.3 mL IJ SOAJ injection Inject 0.3 mg into the muscle as needed for anaphylaxis. 1 each 0   ibuprofen (ADVIL) 200 MG tablet as needed for moderate pain.     KRILL OIL PO Take by mouth.     lidocaine (LIDODERM) 5 % Place 1 patch onto the skin daily. Remove & Discard patch within 12 hours or as directed by MD 30 patch 0   morphine (MS CONTIN) 15 MG 12 hr tablet Take 1 tablet (15 mg total) by mouth every 12 (twelve) hours. 60 tablet 0   morphine (MSIR) 15 MG tablet  Take 1 tablet (15 mg total) by mouth every 4 (four) hours as needed for severe pain. 30 tablet 0   multivitamin-lutein (OCUVITE-LUTEIN) CAPS capsule Take 1 capsule by mouth daily.     predniSONE (DELTASONE) 5 MG tablet Take 1 tablet (5 mg total) by mouth daily with breakfast. 90 tablet 1   prochlorperazine (COMPAZINE) 10 MG tablet Take 1 tablet (10 mg total) by mouth every 6 (six) hours as needed for nausea or vomiting. 30 tablet 0   propranolol (INDERAL) 40 MG tablet Take 1 tablet by mouth daily.     vitamin B-12 (CYANOCOBALAMIN) 1000 MCG tablet  Take 1,000 mcg by mouth daily.     No current facility-administered medications for this encounter.    REVIEW OF SYSTEMS:  On review of systems, the patient reports that he is doing well overall.  He denies any chest pain, shortness of breath, cough, fevers, chills, night sweats, unintended weight changes.  He denies any bowel or bladder disturbances, and denies abdominal pain, nausea or vomiting.  He denies any new musculoskeletal or joint aches or pains aside from the low back pain as outlined above in the HPI. A complete review of systems is obtained and is otherwise negative.    PHYSICAL EXAM:  Wt Readings from Last 3 Encounters:  06/08/22 169 lb 4 oz (76.8 kg)  06/07/22 170 lb (77.1 kg)  05/23/22 170 lb 8 oz (77.3 kg)   Temp Readings from Last 3 Encounters:  06/08/22 (!) 96 F (35.6 C) (Temporal)  05/23/22 98.1 F (36.7 C) (Temporal)  04/03/22 97.6 F (36.4 C) (Temporal)   BP Readings from Last 3 Encounters:  06/08/22 128/67  06/07/22 137/77  05/23/22 123/81   Pulse Readings from Last 3 Encounters:  06/08/22 92  06/07/22 72  05/23/22 77   Pain Assessment Pain Score: 2  Pain Loc: Leg (left  leg)/10  In general this is a well appearing Caucasian male in no acute distress.  He's alert and oriented x4 and appropriate throughout the examination. Cardiopulmonary assessment is negative for acute distress and he exhibits normal effort.    KPS = 80  100 - Normal; no complaints; no evidence of disease. 90   - Able to carry on normal activity; minor signs or symptoms of disease. 80   - Normal activity with effort; some signs or symptoms of disease. 46   - Cares for self; unable to carry on normal activity or to do active work. 60   - Requires occasional assistance, but is able to care for most of his personal needs. 50   - Requires considerable assistance and frequent medical care. 12   - Disabled; requires special care and assistance. 27   - Severely disabled; hospital admission is indicated although death not imminent. 41   - Very sick; hospital admission necessary; active supportive treatment necessary. 10   - Moribund; fatal processes progressing rapidly. 0     - Dead  Karnofsky DA, Abelmann [place], Craver LS and Burchenal [facility] [phone]) The use of the nitrogen mustards in the palliative treatment of carcinoma: with particular reference to bronchogenic carcinoma Cancer 1 634-56  LABORATORY DATA:  Lab Results  Component Value Date   WBC 5.0 05/23/2022   HGB 12.9 (L) 05/23/2022   HCT 38.5 (L) 05/23/2022   MCV 98.0 05/23/2022   PLT 192 05/23/2022   Lab Results  Component Value Date   NA 138 05/23/2022   K 4.6 05/23/2022   CL 108 05/23/2022   CO2 25 05/23/2022   Lab Results  Component Value Date   ALT 7 05/23/2022   AST 24 05/23/2022   ALKPHOS 68 05/23/2022   BILITOT 1.4 (H) 05/23/2022     RADIOGRAPHY: MR THORACIC SPINE W WO CONTRAST  Result Date: 06/05/2022 CLINICAL DATA:  Acute back pain radiating to left leg, metastatic prostate cancer EXAM: MRI THORACIC AND LUMBAR SPINE WITHOUT AND WITH CONTRAST TECHNIQUE: Multiplanar and multiecho pulse sequences of the thoracic and lumbar spine were obtained without and with intravenous contrast. CONTRAST:  26m GADAVIST GADOBUTROL 1 MMOL/ML IV SOLN COMPARISON:  CT chest/abdomen/pelvis 01/05/2022 lumbar spine MRI 12/07/2021 FINDINGS:  MRI THORACIC SPINE FINDINGS Alignment: There  is exaggerated thoracic lordosis. There is no antero or retrolisthesis. Vertebrae: There is a 1.0 cm focus of T1/T2 hypointensity in the T7 vertebral body without enhancement corresponding to a densely sclerotic lesion seen on the prior CT favored to reflect a benign bone island. There is also nonenhancing T1 and T2 hypointensity in the adjacent left seventh rib suspicious for metastatic disease given the appearance on the prior CT. A separate focus of T1 and T2 hyperintensity in the T7 vertebral body is favored to reflect a benign hemangioma. An additional probable hemangioma is seen in the T3 vertebral body. There are no other suspicious lesions.  There is no epidural tumor. Vertebral body heights are preserved, without evidence of acute injury. There are flowing anterior osteophytes throughout the thoracic spine consistent with diffuse idiopathic skeletal hyperostosis. Cord: Normal in signal and morphology. There is no abnormal cord enhancement. Paraspinal and other soft tissues: The paraspinal soft tissues are unremarkable. The known pulmonary metastatic disease is not well assessed. Disc levels: The disc heights in the thoracic spine are overall preserved. There is mild degenerative endplate change and facet arthropathy. There is no significant spinal canal or neural foraminal stenosis, and no evidence of cord or nerve root compression. MRI LUMBAR SPINE FINDINGS Segmentation: Standard; the lowest formed disc space is designated L5-S1. Alignment:  Normal. Vertebrae: Vertebral body heights are preserved. There is no evidence of acute fracture. There is STIR signal abnormality with enhancement in the posterior aspect of the L1 vertebral body which is favored to be degenerative in nature when correlated with the prior CT. Enhancing metastatic disease is seen in the imaged sacrum and left iliac bone (19-3). Additional metastatic disease is seen in the left L5 pedicle (13-3, 11-3). The lesion at L5 is increased in  size/conspicuity since 12/07/2021. There is no evidence of epidural tumor. Fatty marrow signal in the lower lumbar spine and sacrum likely reflects sequela of prior radiation therapy. Conus medullaris: Extends to the L1 level and appears normal. There is no abnormal enhancement of the cauda equina nerve roots. 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 Disc levels: There is essentially obliteration of the disc space at L5-S1 and disc desiccation and narrowing throughout the remainder of the lumbar spine. T12-L1: No significant spinal canal or neural foraminal stenosis L1-L2: There is a mild disc bulge and bilateral facet arthropathy resulting in mild spinal canal stenosis and mild left worse than right neural foraminal stenosis, not significantly changed. L2-L3: There is a mild disc bulge and bilateral facet arthropathy resulting in mild spinal canal stenosis without significant neural foraminal stenosis, not significantly changed. L3-L4: There is mild disc bulge and bilateral facet arthropathy resulting in mild bilateral neural foraminal stenosis without significant spinal canal stenosis, not significantly changed. L4-L5: There is a diffuse disc bulge eccentric to the left and moderate bilateral facet arthropathy resulting in moderate spinal canal stenosis with effacement of the left subarticular zone and possible impingement of the traversing L5 nerve root and moderate to severe left and mild right neural foraminal stenosis, unchanged L5-S1: Degenerative endplate change and bilateral facet arthropathy without significant spinal canal or neural foraminal stenosis. IMPRESSION: 1. Osseous metastatic disease in the sacrum and left iliac bone, left L5 pedicle, and left seventh rib. The lesion at L5 is increased in size and conspicuity since 12/07/2021. 2. No evidence  of epidural tumor or acute injury in the thoracolumbar spine. 3. Signal abnormality in the L1 vertebral body is favored to be degenerative in nature; attention on any follow-up studies. A sclerotic lesion in the T7 vertebral body is favored to reflect a benign bone island. 4. Degenerative changes in the lumbar spine most advanced at L4-L5 where there is moderate spinal canal stenosis with effacement of the left subarticular zone and possible impingement of the traversing L5 nerve root and moderate to severe left neural foraminal stenosis, overall not significantly changed. 5. No significant spinal canal or neural foraminal stenosis in the thoracic spine. 6. 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 Electronically Signed   By: Valetta Mole M.D.   On: 06/05/2022 17:05   MR Lumbar Spine W Wo Contrast  Result Date: 06/05/2022 CLINICAL DATA:  Acute back pain radiating to left leg, metastatic prostate cancer EXAM: MRI THORACIC AND LUMBAR SPINE WITHOUT AND WITH CONTRAST TECHNIQUE: Multiplanar and multiecho pulse sequences of the thoracic and lumbar spine were obtained without and with intravenous contrast. CONTRAST:  16m GADAVIST GADOBUTROL 1 MMOL/ML IV SOLN COMPARISON:  CT chest/abdomen/pelvis 01/05/2022 lumbar spine MRI 12/07/2021 FINDINGS: MRI THORACIC SPINE FINDINGS Alignment: There is exaggerated thoracic lordosis. There is no antero or retrolisthesis. Vertebrae: There is a 1.0 cm focus of T1/T2 hypointensity in the T7 vertebral body without enhancement corresponding to a densely sclerotic lesion seen on the prior CT favored to reflect a benign bone island. There is also nonenhancing T1 and T2 hypointensity in the adjacent left seventh rib suspicious for metastatic disease given the appearance on the prior CT. A separate focus of T1 and T2 hyperintensity in the T7 vertebral body is favored to reflect a benign hemangioma. An additional probable hemangioma is seen in the T3 vertebral body.  There are no other suspicious lesions.  There is no epidural tumor. Vertebral body heights are preserved, without evidence of acute injury. There are flowing anterior osteophytes throughout the thoracic spine consistent with diffuse idiopathic skeletal hyperostosis. Cord: Normal in signal and morphology. There is no abnormal cord enhancement. Paraspinal and other soft tissues: The paraspinal soft tissues are unremarkable. The known pulmonary metastatic disease is not well assessed. Disc levels: The disc heights in the thoracic spine are overall preserved. There is mild degenerative endplate change and facet arthropathy. There is no significant spinal canal or neural foraminal stenosis, and no evidence of cord or nerve root compression. MRI LUMBAR SPINE FINDINGS Segmentation: Standard; the lowest formed disc space is designated L5-S1. Alignment:  Normal. Vertebrae: Vertebral body heights are preserved. There is no evidence of acute fracture. There is STIR signal abnormality with enhancement in the posterior aspect of the L1 vertebral body which is favored to be degenerative in nature when correlated with the prior CT. Enhancing metastatic disease is seen in the imaged sacrum and left iliac bone (19-3). Additional metastatic disease is seen in the left L5 pedicle (13-3, 11-3). The lesion at L5 is increased in size/conspicuity since 12/07/2021. There is no evidence of epidural tumor. Fatty marrow signal in the lower lumbar spine and sacrum likely reflects sequela of prior radiation therapy. Conus medullaris: Extends to the L1 level and appears normal. There is no abnormal enhancement of the cauda equina nerve roots. 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 Disc levels: There is essentially obliteration of the disc space at L5-S1  and disc desiccation and narrowing  throughout the remainder of the lumbar spine. T12-L1: No significant spinal canal or neural foraminal stenosis L1-L2: There is a mild disc bulge and bilateral facet arthropathy resulting in mild spinal canal stenosis and mild left worse than right neural foraminal stenosis, not significantly changed. L2-L3: There is a mild disc bulge and bilateral facet arthropathy resulting in mild spinal canal stenosis without significant neural foraminal stenosis, not significantly changed. L3-L4: There is mild disc bulge and bilateral facet arthropathy resulting in mild bilateral neural foraminal stenosis without significant spinal canal stenosis, not significantly changed. L4-L5: There is a diffuse disc bulge eccentric to the left and moderate bilateral facet arthropathy resulting in moderate spinal canal stenosis with effacement of the left subarticular zone and possible impingement of the traversing L5 nerve root and moderate to severe left and mild right neural foraminal stenosis, unchanged L5-S1: Degenerative endplate change and bilateral facet arthropathy without significant spinal canal or neural foraminal stenosis. IMPRESSION: 1. Osseous metastatic disease in the sacrum and left iliac bone, left L5 pedicle, and left seventh rib. The lesion at L5 is increased in size and conspicuity since 12/07/2021. 2. No evidence of epidural tumor or acute injury in the thoracolumbar spine. 3. Signal abnormality in the L1 vertebral body is favored to be degenerative in nature; attention on any follow-up studies. A sclerotic lesion in the T7 vertebral body is favored to reflect a benign bone island. 4. Degenerative changes in the lumbar spine most advanced at L4-L5 where there is moderate spinal canal stenosis with effacement of the left subarticular zone and possible impingement of the traversing L5 nerve root and moderate to severe left neural foraminal stenosis, overall not significantly changed. 5. No significant spinal canal or  neural foraminal stenosis in the thoracic spine. 6. 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 Electronically Signed   By: Valetta Mole M.D.   On: 06/05/2022 17:05      IMPRESSION/PLAN: 1. 76 y.o. 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  Today, we talked to the patient and his partner about the findings and workup thus far. We discussed the natural history of metastatic adenocarcinoma of the prostate and general treatment, highlighting the role of radiotherapy in the management. We discussed the available radiation techniques, and focused on the details and logistics of delivery.  The recommendation is to proceed with a 2-week course of daily, palliative radiotherapy to the progressive lesion at L5.  We reviewed the anticipated acute and late sequelae associated with radiation in this setting. The patient was encouraged to ask questions that were answered to his stated satisfaction.  He appears to have a good understanding of his disease and our treatment recommendations which are of palliative intent and he is in agreement to proceed with the recommended course of palliative radiation.  He has freely signed written consent to proceed today in the office and a copy of this document will be placed in his medical record.  We will share our discussion with Dr. Alen Blew and proceed with scheduled CT simulation following our visit today, in anticipation of beginning his daily radiation treatments on Monday, 06/11/2022.  We enjoyed meeting with him and his partner again today and look forward to continuing to participate in his care.  We personally spent 45 minutes in this encounter including chart review, reviewing radiological studies, meeting face-to-face  with the patient, entering orders and completing documentation.    Nicholos Johns,  PA-C    Tyler Pita, MD  [facility] Direct Dial: [phone]  Fax: [phone] [email or web address]  Skype  [email or web address]

## 2022-06-10 DIAGNOSIS — C7951 Secondary malignant neoplasm of bone: Secondary | ICD-10-CM | POA: Diagnosis not present

## 2022-06-11 ENCOUNTER — Telehealth: Payer: Self-pay | Admitting: Neurology

## 2022-06-11 ENCOUNTER — Other Ambulatory Visit: Payer: Self-pay

## 2022-06-11 ENCOUNTER — Ambulatory Visit
Admission: RE | Admit: 2022-06-11 | Discharge: 2022-06-11 | Disposition: A | Discharge status: Home / Self Care | Payer: Medicare Other | Source: Ambulatory Visit | Attending: Radiation Oncology | Admitting: Radiation Oncology

## 2022-06-11 DIAGNOSIS — C7951 Secondary malignant neoplasm of bone: Secondary | ICD-10-CM | POA: Diagnosis not present

## 2022-06-11 LAB — RAD ONC ARIA SESSION SUMMARY
Course Elapsed Days: 0
Plan Fractions Treated to Date: 1
Plan Prescribed Dose Per Fraction: 3 Gy
Plan Total Fractions Prescribed: 10
Plan Total Prescribed Dose: 30 Gy
Reference Point Dosage Given to Date: 3 Gy
Reference Point Session Dosage Given: 3 Gy
Session Number: 1

## 2022-06-11 MED ORDER — CARBIDOPA-LEVODOPA 25-100 MG PO TABS: 1.0000 | ORAL_TABLET | Freq: Three times a day (TID) | ORAL | 3 refills | Status: DC

## 2022-06-11 NOTE — Telephone Encounter (Signed)
Escribed to HT sinemt 25/100 take 1 tabelt TID 07-03-1499.

## 2022-06-11 NOTE — Telephone Encounter (Signed)
Pt request refill for carbidopa-levodopa (SINEMET) 25-100 MG tablet at [facility] [phone]

## 2022-06-12 ENCOUNTER — Other Ambulatory Visit: Payer: Self-pay

## 2022-06-12 ENCOUNTER — Ambulatory Visit
Admission: RE | Admit: 2022-06-12 | Discharge: 2022-06-12 | Disposition: A | Discharge status: Home / Self Care | Payer: Medicare Other | Source: Ambulatory Visit | Attending: Radiation Oncology | Admitting: Radiation Oncology

## 2022-06-12 DIAGNOSIS — C7951 Secondary malignant neoplasm of bone: Secondary | ICD-10-CM | POA: Diagnosis not present

## 2022-06-12 LAB — RAD ONC ARIA SESSION SUMMARY
Course Elapsed Days: 1
Plan Fractions Treated to Date: 2
Plan Prescribed Dose Per Fraction: 3 Gy
Plan Total Fractions Prescribed: 10
Plan Total Prescribed Dose: 30 Gy
Reference Point Dosage Given to Date: 6 Gy
Reference Point Session Dosage Given: 3 Gy
Session Number: 2

## 2022-06-12 NOTE — Progress Notes (Signed)
  Radiation Oncology         [phone] ________________________________  Name: Frederick Ponce MRN: [id number]  Date: 06/08/2022  DOB: 1946-02-28  SIMULATION AND TREATMENT PLANNING NOTE    ICD-10-CM   1. Prostate 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 [facility])  C61    C79.51       DIAGNOSIS:  76 y.o. patient with L5 lumbar metastasis  NARRATIVE:  The patient was brought to the [facility].  Identity was confirmed.  All relevant records and images related to the planned course of therapy were reviewed.  The patient freely provided informed written consent to proceed with treatment after reviewing the details related to the planned course of therapy. The consent form was witnessed and verified by the simulation staff.  Then, the patient was set-up in a stable reproducible  supine position for radiation therapy.  CT images were obtained.  Surface markings were placed.  The CT images were loaded into the planning software.  Then the target and avoidance structures were contoured including kidneys.  Treatment planning then occurred.  The radiation prescription was entered and confirmed.  Then, I designed and supervised the construction of a total of 6 medically necessary complex treatment devices with VacLoc positioner and 5 MLCs to shield kidneys.  I have requested : 3D Simulation  I have requested a DVH of the following structures: Left Kidney, Right Kidney and target.  SPECIAL TREATMENT PROCEDURE:  The planned course of therapy using radiation constitutes a special treatment procedure. Special care is required in the management of this patient for the following reasons. This treatment constitutes a Special Treatment Procedure for the following reason: [ Retreatment in a previously radiated area requiring careful monitoring of increased risk of toxicity due to overlap of previous treatment..  The special nature of the planned course of radiotherapy will require increased physician supervision  and oversight to ensure patient's safety with optimal treatment outcomes.  This will require extended time and effort from me.   PLAN:  The patient will receive 30 Gy in 10 fractions.  ________________________________  Sheral Apley Tammi Klippel, M.D.

## 2022-06-13 ENCOUNTER — Inpatient Hospital Stay: Payer: Medicare Other | Admitting: Nurse Practitioner

## 2022-06-13 ENCOUNTER — Other Ambulatory Visit: Payer: Self-pay

## 2022-06-13 ENCOUNTER — Ambulatory Visit
Admission: RE | Admit: 2022-06-13 | Discharge: 2022-06-13 | Disposition: A | Discharge status: Home / Self Care | Payer: Medicare Other | Source: Ambulatory Visit | Attending: Radiation Oncology | Admitting: Radiation Oncology

## 2022-06-13 DIAGNOSIS — C7951 Secondary malignant neoplasm of bone: Secondary | ICD-10-CM | POA: Diagnosis not present

## 2022-06-13 LAB — RAD ONC ARIA SESSION SUMMARY
Course Elapsed Days: 2
Plan Fractions Treated to Date: 3
Plan Prescribed Dose Per Fraction: 3 Gy
Plan Total Fractions Prescribed: 10
Plan Total Prescribed Dose: 30 Gy
Reference Point Dosage Given to Date: 9 Gy
Reference Point Session Dosage Given: 3 Gy
Session Number: 3

## 2022-06-14 ENCOUNTER — Ambulatory Visit
Admission: RE | Admit: 2022-06-14 | Discharge: 2022-06-14 | Disposition: A | Discharge status: Home / Self Care | Payer: Medicare Other | Source: Ambulatory Visit | Attending: Radiation Oncology | Admitting: Radiation Oncology

## 2022-06-14 ENCOUNTER — Inpatient Hospital Stay: Payer: Medicare Other | Attending: Nurse Practitioner | Admitting: Nurse Practitioner

## 2022-06-14 ENCOUNTER — Other Ambulatory Visit: Payer: Self-pay

## 2022-06-14 DIAGNOSIS — C44619 Basal cell carcinoma of skin of left upper limb, including shoulder: Secondary | ICD-10-CM

## 2022-06-14 DIAGNOSIS — C61 Malignant neoplasm of prostate: Secondary | ICD-10-CM | POA: Diagnosis not present

## 2022-06-14 DIAGNOSIS — Z515 Encounter for palliative care: Secondary | ICD-10-CM

## 2022-06-14 DIAGNOSIS — G893 Neoplasm related pain (acute) (chronic): Secondary | ICD-10-CM

## 2022-06-14 DIAGNOSIS — C7951 Secondary malignant neoplasm of bone: Secondary | ICD-10-CM | POA: Diagnosis not present

## 2022-06-14 DIAGNOSIS — Z79899 Other long term (current) drug therapy: Secondary | ICD-10-CM | POA: Insufficient documentation

## 2022-06-14 DIAGNOSIS — K5903 Drug induced constipation: Secondary | ICD-10-CM | POA: Diagnosis not present

## 2022-06-14 DIAGNOSIS — K5900 Constipation, unspecified: Secondary | ICD-10-CM | POA: Insufficient documentation

## 2022-06-14 DIAGNOSIS — C7800 Secondary malignant neoplasm of unspecified lung: Secondary | ICD-10-CM | POA: Insufficient documentation

## 2022-06-14 LAB — RAD ONC ARIA SESSION SUMMARY
Course Elapsed Days: 3
Plan Fractions Treated to Date: 4
Plan Prescribed Dose Per Fraction: 3 Gy
Plan Total Fractions Prescribed: 10
Plan Total Prescribed Dose: 30 Gy
Reference Point Dosage Given to Date: 12 Gy
Reference Point Session Dosage Given: 3 Gy
Session Number: 4

## 2022-06-14 MED ORDER — DULOXETINE HCL 20 MG PO CPEP: 20.0000 mg | ORAL_CAPSULE | Freq: Every day | ORAL | 1 refills | Status: DC

## 2022-06-14 NOTE — Progress Notes (Signed)
[place]  [phone]) [phone] [phone]) [phone]   Name: Frederick Ponce Date: 06/14/2022 MRN: [id number]  DOB: 1946-09-15  Patient Care Team: [facility] [place] as PCP - General Pickenpack-Cousar, Carlena Sax, NP as Nurse Practitioner (Nurse Practitioner)   I connected with Frederick Ponce on 06/14/22 at 11:00 AM EDT by phone nd verified that I am speaking with the correct person using two identifiers.   I discussed the limitations, risks, security and privacy concerns of performing an evaluation and management service by telemedicine and the availability of in-person appointments. I also discussed with the patient that there may be a patient responsible charge related to this service. The patient expressed understanding and agreed to proceed.   Other persons participating in the visit and their role in the encounter: [place], RN    Patient's location: Home   Provider's location: [place]    Chief Complaint: Symptom Management    REASON FOR CONSULTATION: Frederick Ponce is a 76 y.o. male with medical history including Parkinson's disease and hyperlipidemia. 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 He is having urgent radiation appointment due to iliac bone pain. Palliative ask to see for symptom management.    SOCIAL HISTORY:     reports that he has never smoked. He has never used smokeless tobacco. He reports current alcohol use of about 14.0 standard drinks of alcohol per week. He reports that he does not use drugs.  ADVANCE DIRECTIVES:  Patient is scheduled to complete advanced directives at our clinic here at the cancer center on 02/16/2022. MOST form provided.  Patient plans to review and complete at a later time.   CODE STATUS:   PAST MEDICAL HISTORY: Past Medical History:  Diagnosis Date    Hyperlipidemia    Parkinson disease ([place])     PAST SURGICAL HISTORY: History reviewed. No pertinent surgical history.  HEMATOLOGY/ONCOLOGY HISTORY:  Oncology History  Prostate 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 ([place])  01/18/2022 Initial Diagnosis   Prostate 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 ([place])   02/20/2022 - 02/20/2022 Chemotherapy   Patient is on Treatment Plan : PROSTATE Docetaxel q21d     05/23/2022 Cancer Staging   Staging form: Prostate, AJCC 8th Edition - Clinical: Stage IVB (cTX, cNX, cM1c) - Signed by Wyatt Portela, MD on 05/23/2022     ALLERGIES:  is allergic to gabapentin and celebrex [celecoxib].  MEDICATIONS:  Current Outpatient Medications  Medication Sig Dispense Refill   DULoxetine (CYMBALTA) 20 MG capsule Take 1 capsule (20 mg total) by mouth daily. 30 capsule 1   abiraterone acetate (ZYTIGA) 250 MG tablet Take 4 tablets (1,000 mg total) by mouth daily. Take on an empty stomach 1 hour before or 2 hours after a meal 120 tablet 0   atorvastatin (LIPITOR) 40 MG tablet Take 1 tablet by mouth at bedtime.     calcium-vitamin D (OSCAL WITH D) 500-5 MG-MCG tablet Take 1 tablet by mouth 2 (two) times daily. 60 tablet 3   carbidopa-levodopa (SINEMET) 25-100 MG tablet Take 1 tablet by mouth 3 (three) times daily. At 7 AM, 11AM, and 3PM. 270 tablet 3   co-enzyme Q-10 30 MG capsule Take 30 mg by mouth 3 (three) times daily.     diphenhydrAMINE (BENADRYL) 25 MG tablet Take 1 tablet (25 mg total) by mouth every 6 (six) hours as needed. 30 tablet 0   EPINEPHrine 0.3  mg/0.3 mL IJ SOAJ injection Inject 0.3 mg into the muscle as needed for anaphylaxis. 1 each 0   ibuprofen (ADVIL) 200 MG tablet as needed for moderate pain.     KRILL OIL PO Take by mouth.     lidocaine (LIDODERM) 5 % Place 1 patch onto the skin daily. Remove & Discard patch within 12 hours or as directed by MD 30 patch 0   morphine (MS CONTIN) 15 MG 12 hr tablet Take 1 tablet (15 mg total) by mouth every 12 (twelve) hours. 60 tablet  0   morphine (MSIR) 15 MG tablet Take 1 tablet (15 mg total) by mouth every 4 (four) hours as needed for severe pain. 30 tablet 0   multivitamin-lutein (OCUVITE-LUTEIN) CAPS capsule Take 1 capsule by mouth daily.     predniSONE (DELTASONE) 5 MG tablet Take 1 tablet (5 mg total) by mouth daily with breakfast. 90 tablet 1   prochlorperazine (COMPAZINE) 10 MG tablet Take 1 tablet (10 mg total) by mouth every 6 (six) hours as needed for nausea or vomiting. 30 tablet 0   propranolol (INDERAL) 40 MG tablet Take 1 tablet by mouth daily.     vitamin B-12 (CYANOCOBALAMIN) 1000 MCG tablet Take 1,000 mcg by mouth daily.     No current facility-administered medications for this visit.    PERFORMANCE STATUS (ECOG) : 1 - Symptomatic but completely ambulatory    IMPRESSION:  I connected with Frederick Ponce by phone to follow-up on symptom management needs. Recent MRI showed disease progression at L5 which is most likely contributing to his sudden onset of back pain. He has started radiation to the area and tolerating well. No significant change in pain at this time.   Denies nausea, vomiting, diarrhea.   Neoplasm related pain/hot flashes Frederick Ponce reports ongoing lower back and leg pain. He is hopeful radiation will give him relief. Pain is worst later in the day and at night. Does have some nagging pain throughout the day however he tries to remain as active as possible allowing pain to be manageable.   He is tolerating MS Contin. He is taking twice daily due to increase in pain. Also taking prednisone as prescribed. We discussed use of Cymbalta also to hopefully gain additional relief in conjunction with other treatments. Education provided on bone pain and ways to manage in the setting he is unable to take anti-inflammatories.   We will continue to support and follow closely.   Constipation  Constipation in the setting of MS Contin. Encouraged continued use of Miralax daily. He knows if no bowel movement  within 48hrs he may increase to twice daily.    PLAN: MS Contin 15 mg every 12 hours  Tylenol extra strength 3 times a day as needed Cymbalta 20 mg daily Prune juice, tea, MiraLAX daily for bowel regimen  We will plan to have phone follow-up in 1-2 weeks for symptom follow-up. I will see patient back in 6-8 weeks in collaboration to other oncology appointments.   Patient expressed understanding and was in agreement with this plan. He also understands that He can call the clinic at any time with any questions, concerns, or complaints.    Problems Addressed: One acute or chronic illness or injury with exacerbation, progression, cancer, and pain.    Amount and/or Complexity of Data: Category 3:Discussion of management or test interpretation with external physician/other qualified health care professional/appropriate source (not separately reported). Any controlled substances utilized were prescribed in the context of palliative care.  Time Total: 40 min   Visit consisted of counseling and education dealing with the complex and emotionally intense issues of symptom management and palliative care in the setting of serious and potentially life-threatening illness.Greater than 50%  of this time was spent counseling and coordinating care related to the above assessment and plan.  Alda Lea, AGPCNP-BC  Palliative Medicine Team/[facility]

## 2022-06-15 ENCOUNTER — Other Ambulatory Visit: Payer: Self-pay

## 2022-06-15 ENCOUNTER — Ambulatory Visit
Admission: RE | Admit: 2022-06-15 | Discharge: 2022-06-15 | Disposition: A | Discharge status: Home / Self Care | Payer: Medicare Other | Source: Ambulatory Visit | Attending: Radiation Oncology | Admitting: Radiation Oncology

## 2022-06-15 DIAGNOSIS — C7951 Secondary malignant neoplasm of bone: Secondary | ICD-10-CM | POA: Diagnosis not present

## 2022-06-15 LAB — RAD ONC ARIA SESSION SUMMARY
Course Elapsed Days: 4
Plan Fractions Treated to Date: 5
Plan Prescribed Dose Per Fraction: 3 Gy
Plan Total Fractions Prescribed: 10
Plan Total Prescribed Dose: 30 Gy
Reference Point Dosage Given to Date: 15 Gy
Reference Point Session Dosage Given: 3 Gy
Session Number: 5

## 2022-06-18 ENCOUNTER — Other Ambulatory Visit: Payer: Self-pay

## 2022-06-18 ENCOUNTER — Ambulatory Visit
Admission: RE | Admit: 2022-06-18 | Discharge: 2022-06-18 | Disposition: A | Discharge status: Home / Self Care | Payer: Medicare Other | Source: Ambulatory Visit | Attending: Radiation Oncology | Admitting: Radiation Oncology

## 2022-06-18 DIAGNOSIS — C7951 Secondary malignant neoplasm of bone: Secondary | ICD-10-CM | POA: Diagnosis not present

## 2022-06-18 LAB — RAD ONC ARIA SESSION SUMMARY
Course Elapsed Days: 7
Plan Fractions Treated to Date: 6
Plan Prescribed Dose Per Fraction: 3 Gy
Plan Total Fractions Prescribed: 10
Plan Total Prescribed Dose: 30 Gy
Reference Point Dosage Given to Date: 18 Gy
Reference Point Session Dosage Given: 3 Gy
Session Number: 6

## 2022-06-19 ENCOUNTER — Other Ambulatory Visit: Payer: Self-pay

## 2022-06-19 ENCOUNTER — Other Ambulatory Visit: Payer: Self-pay | Admitting: Oncology

## 2022-06-19 ENCOUNTER — Ambulatory Visit
Admission: RE | Admit: 2022-06-19 | Discharge: 2022-06-19 | Disposition: A | Discharge status: Home / Self Care | Payer: Medicare Other | Source: Ambulatory Visit | Attending: Radiation Oncology | Admitting: Radiation Oncology

## 2022-06-19 ENCOUNTER — Other Ambulatory Visit (HOSPITAL_COMMUNITY): Payer: Self-pay

## 2022-06-19 DIAGNOSIS — C61 Malignant neoplasm of prostate: Secondary | ICD-10-CM

## 2022-06-19 DIAGNOSIS — C7951 Secondary malignant neoplasm of bone: Secondary | ICD-10-CM | POA: Diagnosis not present

## 2022-06-19 LAB — RAD ONC ARIA SESSION SUMMARY
Course Elapsed Days: 8
Plan Fractions Treated to Date: 7
Plan Prescribed Dose Per Fraction: 3 Gy
Plan Total Fractions Prescribed: 10
Plan Total Prescribed Dose: 30 Gy
Reference Point Dosage Given to Date: 21 Gy
Reference Point Session Dosage Given: 3 Gy
Session Number: 7

## 2022-06-19 MED ORDER — ABIRATERONE ACETATE 250 MG PO TABS
1000.0000 mg | ORAL_TABLET | Freq: Every day | ORAL | 0 refills | Status: DC
  Filled 2022-06-19: qty 120, 30d supply, fill #0

## 2022-06-20 ENCOUNTER — Ambulatory Visit
Admission: RE | Admit: 2022-06-20 | Discharge: 2022-06-20 | Disposition: A | Discharge status: Home / Self Care | Payer: Medicare Other | Source: Ambulatory Visit | Attending: Radiation Oncology | Admitting: Radiation Oncology

## 2022-06-20 ENCOUNTER — Other Ambulatory Visit: Payer: Self-pay

## 2022-06-20 DIAGNOSIS — C7951 Secondary malignant neoplasm of bone: Secondary | ICD-10-CM | POA: Diagnosis not present

## 2022-06-20 LAB — RAD ONC ARIA SESSION SUMMARY
Course Elapsed Days: 9
Plan Fractions Treated to Date: 8
Plan Prescribed Dose Per Fraction: 3 Gy
Plan Total Fractions Prescribed: 10
Plan Total Prescribed Dose: 30 Gy
Reference Point Dosage Given to Date: 24 Gy
Reference Point Session Dosage Given: 3 Gy
Session Number: 8

## 2022-06-21 ENCOUNTER — Ambulatory Visit
Admission: RE | Admit: 2022-06-21 | Discharge: 2022-06-21 | Disposition: A | Discharge status: Home / Self Care | Payer: Medicare Other | Source: Ambulatory Visit | Attending: Radiation Oncology | Admitting: Radiation Oncology

## 2022-06-21 ENCOUNTER — Other Ambulatory Visit: Payer: Self-pay

## 2022-06-21 DIAGNOSIS — C7951 Secondary malignant neoplasm of bone: Secondary | ICD-10-CM | POA: Diagnosis not present

## 2022-06-21 LAB — RAD ONC ARIA SESSION SUMMARY
Course Elapsed Days: 10
Plan Fractions Treated to Date: 9
Plan Prescribed Dose Per Fraction: 3 Gy
Plan Total Fractions Prescribed: 10
Plan Total Prescribed Dose: 30 Gy
Reference Point Dosage Given to Date: 27 Gy
Reference Point Session Dosage Given: 3 Gy
Session Number: 9

## 2022-06-22 ENCOUNTER — Ambulatory Visit
Admission: RE | Admit: 2022-06-22 | Discharge: 2022-06-22 | Disposition: A | Discharge status: Home / Self Care | Payer: Medicare Other | Source: Ambulatory Visit | Attending: Radiation Oncology | Admitting: Radiation Oncology

## 2022-06-22 ENCOUNTER — Other Ambulatory Visit: Payer: Self-pay

## 2022-06-22 ENCOUNTER — Encounter: Payer: Self-pay | Admitting: Urology

## 2022-06-22 ENCOUNTER — Inpatient Hospital Stay: Payer: Medicare Other | Admitting: Nurse Practitioner

## 2022-06-22 DIAGNOSIS — C7951 Secondary malignant neoplasm of bone: Secondary | ICD-10-CM | POA: Diagnosis not present

## 2022-06-22 DIAGNOSIS — C61 Malignant neoplasm of prostate: Secondary | ICD-10-CM

## 2022-06-22 LAB — RAD ONC ARIA SESSION SUMMARY
Course Elapsed Days: 11
Plan Fractions Treated to Date: 10
Plan Prescribed Dose Per Fraction: 3 Gy
Plan Total Fractions Prescribed: 10
Plan Total Prescribed Dose: 30 Gy
Reference Point Dosage Given to Date: 30 Gy
Reference Point Session Dosage Given: 3 Gy
Session Number: 10

## 2022-06-27 ENCOUNTER — Other Ambulatory Visit (HOSPITAL_COMMUNITY): Payer: Self-pay

## 2022-07-02 ENCOUNTER — Telehealth: Payer: Self-pay

## 2022-07-02 ENCOUNTER — Other Ambulatory Visit: Payer: Self-pay | Admitting: Nurse Practitioner

## 2022-07-02 MED ORDER — IBUPROFEN 400 MG PO TABS: 400.0000 mg | ORAL_TABLET | Freq: Three times a day (TID) | ORAL | 0 refills | Status: DC | PRN

## 2022-07-02 MED ORDER — MORPHINE SULFATE ER 15 MG PO TBCR: 15.0000 mg | EXTENDED_RELEASE_TABLET | Freq: Two times a day (BID) | ORAL | 0 refills | Status: DC

## 2022-07-02 NOTE — Telephone Encounter (Signed)
Pt called requesting a refill of MS contin, refill sent in. Pt also made aware that he can take ibuprofen (a previous medication was contraindicated) pt stated that we would take home dose of ibuprofen for pain management., no further needs at this time.

## 2022-07-06 ENCOUNTER — Telehealth: Payer: Self-pay

## 2022-07-06 ENCOUNTER — Other Ambulatory Visit: Payer: Self-pay

## 2022-07-06 DIAGNOSIS — C61 Malignant neoplasm of prostate: Secondary | ICD-10-CM

## 2022-07-06 NOTE — Progress Notes (Signed)
Referral faxed to Dr. Johnny Bridge, [place] in [facility] per Dr Tammi Klippel and Dr Alen Blew.

## 2022-07-06 NOTE — Telephone Encounter (Signed)
Patient identity verified. Patient complains that LT leg pain w/ occasional numbness, has not gotten any better post 2 wks of palliative radiation treatments. Pain medications (morphine/ tylenol) working at a minium. Current LT leg pain is a 6/10, w/ worst pain being a 10/10 in PM. Patient also complaining of full body pain over all 5/10, hot flashes, and insomnia (due to pain). Patient states "I waited 2 wks to see improvement, as I was told to do and today 07/06/22 makes 2 wks and I see no improvement." Patient wants to know what his next steps for pain relief would be?   I relayed to patient that I would get this information to his care team here at [facility]. Patient verbalized understanding.  Patient contact [phone]

## 2022-07-13 ENCOUNTER — Other Ambulatory Visit: Payer: Self-pay | Admitting: Oncology

## 2022-07-13 ENCOUNTER — Other Ambulatory Visit (HOSPITAL_COMMUNITY): Payer: Self-pay

## 2022-07-13 DIAGNOSIS — C7951 Secondary malignant neoplasm of bone: Secondary | ICD-10-CM

## 2022-07-13 MED ORDER — ABIRATERONE ACETATE 250 MG PO TABS
1000.0000 mg | ORAL_TABLET | Freq: Every day | ORAL | 0 refills | Status: DC
  Filled 2022-07-13: qty 120, 30d supply, fill #0

## 2022-07-18 ENCOUNTER — Inpatient Hospital Stay (HOSPITAL_BASED_OUTPATIENT_CLINIC_OR_DEPARTMENT_OTHER): Payer: Medicare Other | Admitting: Nurse Practitioner

## 2022-07-18 ENCOUNTER — Other Ambulatory Visit: Payer: Self-pay

## 2022-07-18 ENCOUNTER — Ambulatory Visit (HOSPITAL_COMMUNITY)
Admission: RE | Admit: 2022-07-18 | Discharge: 2022-07-18 | Disposition: A | Discharge status: Home / Self Care | Payer: Medicare Other | Source: Ambulatory Visit | Attending: Oncology | Admitting: Oncology

## 2022-07-18 ENCOUNTER — Inpatient Hospital Stay: Payer: Medicare Other | Attending: Nurse Practitioner

## 2022-07-18 ENCOUNTER — Encounter: Payer: Self-pay | Admitting: Nurse Practitioner

## 2022-07-18 VITALS — BP 147/76 | HR 72 | Temp 98.0°F | Wt 168.0 lb

## 2022-07-18 DIAGNOSIS — C61 Malignant neoplasm of prostate: Secondary | ICD-10-CM | POA: Insufficient documentation

## 2022-07-18 DIAGNOSIS — K5900 Constipation, unspecified: Secondary | ICD-10-CM | POA: Insufficient documentation

## 2022-07-18 DIAGNOSIS — R530 Neoplastic (malignant) related fatigue: Secondary | ICD-10-CM

## 2022-07-18 DIAGNOSIS — C7951 Secondary malignant neoplasm of bone: Secondary | ICD-10-CM | POA: Insufficient documentation

## 2022-07-18 DIAGNOSIS — G893 Neoplasm related pain (acute) (chronic): Secondary | ICD-10-CM

## 2022-07-18 DIAGNOSIS — C7800 Secondary malignant neoplasm of unspecified lung: Secondary | ICD-10-CM | POA: Insufficient documentation

## 2022-07-18 DIAGNOSIS — Z79899 Other long term (current) drug therapy: Secondary | ICD-10-CM | POA: Diagnosis not present

## 2022-07-18 DIAGNOSIS — Z515 Encounter for palliative care: Secondary | ICD-10-CM

## 2022-07-18 DIAGNOSIS — K5903 Drug induced constipation: Secondary | ICD-10-CM | POA: Diagnosis not present

## 2022-07-18 LAB — CMP (CANCER CENTER ONLY)
ALT: 7 U/L (ref 0–44)
AST: 14 U/L — ABNORMAL LOW (ref 15–41)
Albumin: 4.3 g/dL (ref 3.5–5.0)
Alkaline Phosphatase: 55 U/L (ref 38–126)
Anion gap: 6 (ref 5–15)
BUN: 13 mg/dL (ref 8–23)
CO2: 29 mmol/L (ref 22–32)
Calcium: 9.1 mg/dL (ref 8.9–10.3)
Chloride: 103 mmol/L (ref 98–111)
Creatinine: 0.76 mg/dL (ref 0.61–1.24)
GFR, Estimated: >60 mL/min (ref >60)
Glucose, Bld: 116 mg/dL — ABNORMAL HIGH (ref 70–99)
Potassium: 4.2 mmol/L (ref 3.5–5.1)
Sodium: 138 mmol/L (ref 135–145)
Total Bilirubin: 1.7 mg/dL — ABNORMAL HIGH (ref 0.3–1.2)
Total Protein: 6.7 g/dL (ref 6.5–8.1)

## 2022-07-18 LAB — CBC WITH DIFFERENTIAL (CANCER CENTER ONLY)
Abs Immature Granulocytes: 0.01 10*3/uL (ref 0.00–0.07)
Basophils Absolute: 0 10*3/uL (ref 0.0–0.1)
Basophils Relative: 0 %
Eosinophils Absolute: 0.1 10*3/uL (ref 0.0–0.5)
Eosinophils Relative: 1 %
HCT: 35.9 % — ABNORMAL LOW (ref 39.0–52.0)
Hemoglobin: 11.9 g/dL — ABNORMAL LOW (ref 13.0–17.0)
Immature Granulocytes: 0 %
Lymphocytes Relative: 11 %
Lymphs Abs: 0.8 10*3/uL (ref 0.7–4.0)
MCH: 33.1 pg (ref 26.0–34.0)
MCHC: 33.1 g/dL (ref 30.0–36.0)
MCV: 100 fL (ref 80.0–100.0)
Monocytes Absolute: 0.4 10*3/uL (ref 0.1–1.0)
Monocytes Relative: 6 %
Neutro Abs: 5.8 10*3/uL (ref 1.7–7.7)
Neutrophils Relative %: 82 %
Platelet Count: 179 10*3/uL (ref 150–400)
RBC: 3.59 MIL/uL — ABNORMAL LOW (ref 4.22–5.81)
RDW: 13 % (ref 11.5–15.5)
WBC Count: 7.1 10*3/uL (ref 4.0–10.5)
nRBC: 0 % (ref 0.0–0.2)

## 2022-07-18 MED ORDER — SODIUM CHLORIDE (PF) 0.9 % IJ SOLN
INTRAMUSCULAR | Status: AC
  Filled 2022-07-18: qty 50

## 2022-07-18 MED ORDER — IOHEXOL 300 MG/ML  SOLN
100.0000 mL | Freq: Once | INTRAMUSCULAR | Status: AC | PRN
Start: 2022-07-18 — End: 2022-07-18
  Administered 2022-07-18: 100 mL via INTRAVENOUS

## 2022-07-18 NOTE — Progress Notes (Signed)
[place]  Telephone:(336) [phone] [phone]) [phone]   Name: Frederick Ponce Date: 07/18/2022 MRN: [id number]  DOB: November 09, 1946  Patient Care Team: [facility] [place] as PCP - General Pickenpack-Cousar, Frederick Sax, NP as Nurse Practitioner (Nurse Practitioner)     REASON FOR CONSULTATION: Frederick Ponce is a 76 y.o. male with medical history including Parkinson's disease and hyperlipidemia. 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 He is having urgent radiation appointment due to iliac bone pain. Palliative ask to see for symptom management.    SOCIAL HISTORY:     reports that he has never smoked. He has never used smokeless tobacco. He reports current alcohol use of about 14.0 standard drinks of alcohol per week. He reports that he does not use drugs.  ADVANCE DIRECTIVES:  Patient is scheduled to complete advanced directives at our clinic here at the cancer center on 02/16/2022. MOST form provided.  Patient plans to review and complete at a later time.   CODE STATUS:   PAST MEDICAL HISTORY: Past Medical History:  Diagnosis Date   Hyperlipidemia    Parkinson disease ([place])     PAST SURGICAL HISTORY: History reviewed. No pertinent surgical history.  HEMATOLOGY/ONCOLOGY HISTORY:  Oncology History  Prostate 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 ([place])  01/18/2022 Initial Diagnosis   Prostate 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 ([place])   02/20/2022 - 02/20/2022 Chemotherapy   Patient is on Treatment Plan : PROSTATE Docetaxel q21d     05/23/2022 Cancer Staging   Staging form: Prostate, AJCC 8th Edition - Clinical: Stage IVB (cTX, cNX, cM1c) - Signed by Frederick Portela, MD on 05/23/2022     ALLERGIES:  is allergic to gabapentin and celebrex [celecoxib].  MEDICATIONS:  Current Outpatient Medications  Medication Sig Dispense Refill    abiraterone acetate (ZYTIGA) 250 MG tablet Take 4 tablets (1,000 mg total) by mouth daily. Take on an empty stomach 1 hour before or 2 hours after a meal 120 tablet 0   atorvastatin (LIPITOR) 40 MG tablet Take 1 tablet by mouth at bedtime.     calcium-vitamin D (OSCAL WITH D) 500-5 MG-MCG tablet Take 1 tablet by mouth 2 (two) times daily. 60 tablet 3   carbidopa-levodopa (SINEMET) 25-100 MG tablet Take 1 tablet by mouth 3 (three) times daily. At 7 AM, 11AM, and 3PM. 270 tablet 3   co-enzyme Q-10 30 MG capsule Take 30 mg by mouth 3 (three) times daily.     diphenhydrAMINE (BENADRYL) 25 MG tablet Take 1 tablet (25 mg total) by mouth every 6 (six) hours as needed. 30 tablet 0   DULoxetine (CYMBALTA) 20 MG capsule Take 1 capsule (20 mg total) by mouth daily. 30 capsule 1   EPINEPHrine 0.3 mg/0.3 mL IJ SOAJ injection Inject 0.3 mg into the muscle as needed for anaphylaxis. 1 each 0   ibuprofen (ADVIL) 400 MG tablet Take 1 tablet (400 mg total) by mouth every 8 (eight) hours as needed. 90 tablet 0   KRILL OIL PO Take by mouth.     lidocaine (LIDODERM) 5 % Place 1 patch onto the skin daily. Remove & Discard patch within 12 hours or as directed by MD 30 patch 0   morphine (MS CONTIN) 15 MG 12 hr tablet Take 1 tablet (15 mg total) by mouth every 12 (twelve) hours. 60 tablet 0   morphine (MSIR) 15 MG tablet Take 1 tablet (15  mg total) by mouth every 4 (four) hours as needed for severe pain. 30 tablet 0   multivitamin-lutein (OCUVITE-LUTEIN) CAPS capsule Take 1 capsule by mouth daily.     predniSONE (DELTASONE) 5 MG tablet Take 1 tablet (5 mg total) by mouth daily with breakfast. 90 tablet 1   prochlorperazine (COMPAZINE) 10 MG tablet Take 1 tablet (10 mg total) by mouth every 6 (six) hours as needed for nausea or vomiting. 30 tablet 0   propranolol (INDERAL) 40 MG tablet Take 1 tablet by mouth daily.     vitamin B-12 (CYANOCOBALAMIN) 1000 MCG tablet Take 1,000 mcg by mouth daily.     No current  facility-administered medications for this visit.    PERFORMANCE STATUS (ECOG) : 1 - Symptomatic but completely ambulatory   IMPRESSION:  Frederick Ponce and his husband presented to clinic today for follow-up. He is scheduled for scans today. No acute distress noted. Ambulatory without difficulty.  Denies nausea, vomiting, diarrhea.   Neoplasm related pain/hot flashes [name] reports ongoing lower back and leg pain. His pain is manageable at this time. We discussed regimen and frequency. Tolerating MS Contin and Cymbalta. He is hopeful for ongoing improvement and stability. States when he does not take MS Contin can tell a difference in his pain. Per notations a referral has been appropriately sent to Frederick Ponce [facility]). Patient has not received any information regarding next step. He knows we will follow-up with medical team and assist in anyway we can.   We will continue to support and follow closely.   Constipation  Constipation in the setting of MS Contin. Encouraged continued use of Miralax daily. He knows if no bowel movement within 48hrs he may increase to twice daily.    PLAN: MS Contin 15 mg every 12 hours  Tylenol extra strength 3 times a day as needed Cymbalta 20 mg daily Prune juice, tea, MiraLAX daily for bowel regimen  We will plan to have phone follow-up in 1-2 weeks for symptom follow-up. I will see patient back in 6-8 weeks in collaboration to other oncology appointments.   Patient expressed understanding and was in agreement with this plan. He also understands that He can call the clinic at any time with any questions, concerns, or complaints.       Any controlled substances utilized were prescribed in the context of palliative care. PDMP has been reviewed.    Time Total: 25 min   Visit consisted of counseling and education dealing with the complex and emotionally intense issues of symptom management and palliative care in the setting of serious and potentially  life-threatening illness.Greater than 50%  of this time was spent counseling and coordinating care related to the above assessment and plan.  Frederick Ponce, AGPCNP-BC  Palliative Medicine Team/[place]

## 2022-07-19 LAB — PROSTATE-SPECIFIC AG, SERUM (LABCORP): Prostate Specific Ag, Serum: <0.1 ng/mL (ref 0.0–4.0)

## 2022-07-21 ENCOUNTER — Inpatient Hospital Stay (HOSPITAL_COMMUNITY)
Admission: EM | Admit: 2022-07-21 | Discharge: 2022-07-26 | DRG: 917 | Disposition: A | Discharge status: Home / Self Care | Payer: Medicare Other | Attending: Internal Medicine | Admitting: Internal Medicine

## 2022-07-21 ENCOUNTER — Emergency Department (HOSPITAL_COMMUNITY): Payer: Medicare Other

## 2022-07-21 ENCOUNTER — Other Ambulatory Visit: Payer: Self-pay

## 2022-07-21 ENCOUNTER — Encounter (HOSPITAL_COMMUNITY): Payer: Self-pay

## 2022-07-21 DIAGNOSIS — Z820 Family history of epilepsy and other diseases of the nervous system: Secondary | ICD-10-CM

## 2022-07-21 DIAGNOSIS — J9602 Acute respiratory failure with hypercapnia: Secondary | ICD-10-CM | POA: Diagnosis present

## 2022-07-21 DIAGNOSIS — G20 Parkinson's disease: Secondary | ICD-10-CM | POA: Diagnosis present

## 2022-07-21 DIAGNOSIS — C7951 Secondary malignant neoplasm of bone: Secondary | ICD-10-CM

## 2022-07-21 DIAGNOSIS — Z79899 Other long term (current) drug therapy: Secondary | ICD-10-CM

## 2022-07-21 DIAGNOSIS — D7589 Other specified diseases of blood and blood-forming organs: Secondary | ICD-10-CM | POA: Diagnosis present

## 2022-07-21 DIAGNOSIS — G9340 Encephalopathy, unspecified: Secondary | ICD-10-CM | POA: Diagnosis not present

## 2022-07-21 DIAGNOSIS — G928 Other toxic encephalopathy: Secondary | ICD-10-CM | POA: Diagnosis present

## 2022-07-21 DIAGNOSIS — Z8546 Personal history of malignant neoplasm of prostate: Secondary | ICD-10-CM

## 2022-07-21 DIAGNOSIS — T50902A Poisoning by unspecified drugs, medicaments and biological substances, intentional self-harm, initial encounter: Secondary | ICD-10-CM | POA: Diagnosis present

## 2022-07-21 DIAGNOSIS — J9811 Atelectasis: Secondary | ICD-10-CM | POA: Diagnosis present

## 2022-07-21 DIAGNOSIS — N19 Unspecified kidney failure: Secondary | ICD-10-CM | POA: Diagnosis present

## 2022-07-21 DIAGNOSIS — F4323 Adjustment disorder with mixed anxiety and depressed mood: Secondary | ICD-10-CM | POA: Diagnosis present

## 2022-07-21 DIAGNOSIS — J9601 Acute respiratory failure with hypoxia: Secondary | ICD-10-CM | POA: Diagnosis present

## 2022-07-21 DIAGNOSIS — E860 Dehydration: Secondary | ICD-10-CM | POA: Diagnosis present

## 2022-07-21 DIAGNOSIS — Z7952 Long term (current) use of systemic steroids: Secondary | ICD-10-CM

## 2022-07-21 DIAGNOSIS — Z20822 Contact with and (suspected) exposure to covid-19: Secondary | ICD-10-CM | POA: Diagnosis present

## 2022-07-21 DIAGNOSIS — T402X2A Poisoning by other opioids, intentional self-harm, initial encounter: Principal | ICD-10-CM | POA: Diagnosis present

## 2022-07-21 DIAGNOSIS — F411 Generalized anxiety disorder: Secondary | ICD-10-CM | POA: Diagnosis present

## 2022-07-21 DIAGNOSIS — I119 Hypertensive heart disease without heart failure: Secondary | ICD-10-CM | POA: Diagnosis present

## 2022-07-21 DIAGNOSIS — Z66 Do not resuscitate: Secondary | ICD-10-CM | POA: Diagnosis present

## 2022-07-21 DIAGNOSIS — T40602A Poisoning by unspecified narcotics, intentional self-harm, initial encounter: Secondary | ICD-10-CM

## 2022-07-21 DIAGNOSIS — Z79891 Long term (current) use of opiate analgesic: Secondary | ICD-10-CM

## 2022-07-21 DIAGNOSIS — R680 Hypothermia, not associated with low environmental temperature: Secondary | ICD-10-CM | POA: Diagnosis present

## 2022-07-21 DIAGNOSIS — E785 Hyperlipidemia, unspecified: Secondary | ICD-10-CM | POA: Diagnosis present

## 2022-07-21 DIAGNOSIS — C61 Malignant neoplasm of prostate: Secondary | ICD-10-CM

## 2022-07-21 DIAGNOSIS — T50901A Poisoning by unspecified drugs, medicaments and biological substances, accidental (unintentional), initial encounter: Secondary | ICD-10-CM | POA: Diagnosis present

## 2022-07-21 DIAGNOSIS — G20A1 Parkinson's disease without dyskinesia, without mention of fluctuations: Secondary | ICD-10-CM | POA: Diagnosis present

## 2022-07-21 DIAGNOSIS — Z888 Allergy status to other drugs, medicaments and biological substances status: Secondary | ICD-10-CM

## 2022-07-21 HISTORY — DX: Malignant neoplasm of prostate: C61

## 2022-07-21 LAB — COMPREHENSIVE METABOLIC PANEL
ALT: 10 U/L (ref 0–44)
AST: 21 U/L (ref 15–41)
Albumin: 3.8 g/dL (ref 3.5–5.0)
Alkaline Phosphatase: 50 U/L (ref 38–126)
Anion gap: 6 (ref 5–15)
BUN: 14 mg/dL (ref 8–23)
CO2: 22 mmol/L (ref 22–32)
Calcium: 8 mg/dL — ABNORMAL LOW (ref 8.9–10.3)
Chloride: 115 mmol/L — ABNORMAL HIGH (ref 98–111)
Creatinine, Ser: 0.67 mg/dL (ref 0.61–1.24)
GFR, Estimated: >60 mL/min (ref >60)
Glucose, Bld: 122 mg/dL — ABNORMAL HIGH (ref 70–99)
Potassium: 4.4 mmol/L (ref 3.5–5.1)
Sodium: 143 mmol/L (ref 135–145)
Total Bilirubin: 1.2 mg/dL (ref 0.3–1.2)
Total Protein: 6.5 g/dL (ref 6.5–8.1)

## 2022-07-21 LAB — CBC WITH DIFFERENTIAL/PLATELET
Abs Immature Granulocytes: 0.02 10*3/uL (ref 0.00–0.07)
Basophils Absolute: 0 10*3/uL (ref 0.0–0.1)
Basophils Relative: 0 %
Eosinophils Absolute: 0 10*3/uL (ref 0.0–0.5)
Eosinophils Relative: 0 %
HCT: 41.9 % (ref 39.0–52.0)
Hemoglobin: 13.5 g/dL (ref 13.0–17.0)
Immature Granulocytes: 0 %
Lymphocytes Relative: 13 %
Lymphs Abs: 0.9 10*3/uL (ref 0.7–4.0)
MCH: 33.3 pg (ref 26.0–34.0)
MCHC: 32.2 g/dL (ref 30.0–36.0)
MCV: 103.5 fL — ABNORMAL HIGH (ref 80.0–100.0)
Monocytes Absolute: 0.4 10*3/uL (ref 0.1–1.0)
Monocytes Relative: 6 %
Neutro Abs: 5.6 10*3/uL (ref 1.7–7.7)
Neutrophils Relative %: 81 %
Platelets: 205 10*3/uL (ref 150–400)
RBC: 4.05 MIL/uL — ABNORMAL LOW (ref 4.22–5.81)
RDW: 13.1 % (ref 11.5–15.5)
WBC: 7 10*3/uL (ref 4.0–10.5)
nRBC: 0 % (ref 0.0–0.2)

## 2022-07-21 LAB — BLOOD GAS, ARTERIAL
Acid-base deficit: 3.1 mmol/L — ABNORMAL HIGH (ref 0.0–2.0)
Bicarbonate: 23.6 mmol/L (ref 20.0–28.0)
O2 Saturation: 99.2 %
Patient temperature: 37
pCO2 arterial: 48 mmHg (ref 32–48)
pH, Arterial: 7.3 — ABNORMAL LOW (ref 7.35–7.45)
pO2, Arterial: 172 mmHg — ABNORMAL HIGH (ref 83–108)

## 2022-07-21 LAB — CBG MONITORING, ED: Glucose-Capillary: 147 mg/dL — ABNORMAL HIGH (ref 70–99)

## 2022-07-21 LAB — RESP PANEL BY RT-PCR (FLU A&B, COVID) ARPGX2
Influenza A by PCR: NEGATIVE
Influenza B by PCR: NEGATIVE
SARS Coronavirus 2 by RT PCR: NEGATIVE

## 2022-07-21 LAB — ACETAMINOPHEN LEVEL: Acetaminophen (Tylenol), Serum: <10 ug/mL — ABNORMAL LOW (ref 10–30)

## 2022-07-21 LAB — ETHANOL: Alcohol, Ethyl (B): <10 mg/dL (ref <10)

## 2022-07-21 LAB — SALICYLATE LEVEL: Salicylate Lvl: <7 mg/dL — ABNORMAL LOW (ref 7.0–30.0)

## 2022-07-21 MED ORDER — SODIUM CHLORIDE 0.9 % IV SOLN: INTRAVENOUS | Status: DC

## 2022-07-21 MED ORDER — SODIUM CHLORIDE (PF) 0.9 % IJ SOLN
INTRAMUSCULAR | Status: AC
  Filled 2022-07-21: qty 50

## 2022-07-21 MED ORDER — ACETAMINOPHEN 325 MG PO TABS
650.0000 mg | ORAL_TABLET | Freq: Four times a day (QID) | ORAL | Status: DC | PRN
  Filled 2022-07-21: qty 2

## 2022-07-21 MED ORDER — ACETAMINOPHEN 650 MG RE SUPP: 650.0000 mg | Freq: Four times a day (QID) | RECTAL | Status: DC | PRN

## 2022-07-21 MED ORDER — NALOXONE HCL 0.4 MG/ML IJ SOLN: 0.4000 mg | INTRAMUSCULAR | Status: DC | PRN

## 2022-07-21 NOTE — ED Notes (Addendum)
Poison control called. Patty at [facility] recommends 2 hr obs, EKG, and tylenol level

## 2022-07-21 NOTE — ED Triage Notes (Signed)
BIBA from home for overdose with SI attempt and suicide note.  PT is A&O x4 on arrival, slow to respond, pinpoint pupils, aphasia.  Cbg with ems 135.  Pt has prostate ca and found out 3 days ago that he has mets to lung and lymph nodes.  Pt took unknown amount of morphine IR '15mg'$  and oxycodone '5mg'$ , and 3-4 tabs of tramadol '50mg'$  @ 1630.  All bottles was empty. Morphine was filled 06/04/22 and oxycodone was filled 01/17/22.

## 2022-07-21 NOTE — ED Notes (Signed)
Husband is bringing suicide note, Bair hugger placed due to temp, MD aware

## 2022-07-21 NOTE — ED Provider Notes (Signed)
[facility] DEPT Provider Note   CSN: [id number] Arrival date & time: 07/21/22  2101     History  Chief Complaint  Patient presents with   Suicide Attempt   Drug Overdose    Frederick Ponce is a 76 y.o. male.  HPI 76 year old male presents with an overdose.  Patient reportedly took some amount of tramadol, oxycodone without Tylenol, and immediately release morphine at around 4:30 PM.  Report from the nurse was this was a suicide attempt given recent diagnosis of abnormal lymph nodes and cancer.  Patient tells me there was no specific reason though he is also pretty sleepy and unable to provide a clear history.  Home Medications Prior to Admission medications   Medication Sig Start Date End Date Taking? Authorizing Provider  abiraterone acetate (ZYTIGA) 250 MG tablet Take 4 tablets (1,000 mg total) by mouth daily. Take on an empty stomach 1 hour before or 2 hours after a meal 07/13/22   Wyatt Portela, MD  atorvastatin (LIPITOR) 40 MG tablet Take 1 tablet by mouth at bedtime.    [provider]  calcium-vitamin D (OSCAL WITH D) 500-5 MG-MCG tablet Take 1 tablet by mouth 2 (two) times daily. 03/13/22   Wyatt Portela, MD  carbidopa-levodopa (SINEMET) 25-100 MG tablet Take 1 tablet by mouth 3 (three) times daily. At 7 AM, 11AM, and 3PM. 06/11/22   Star Age, MD  co-enzyme Q-10 30 MG capsule Take 30 mg by mouth 3 (three) times daily.    [provider]  diphenhydrAMINE (BENADRYL) 25 MG tablet Take 1 tablet (25 mg total) by mouth every 6 (six) hours as needed. 01/20/22   Smoot, Leary Roca, PA-C  DULoxetine (CYMBALTA) 20 MG capsule Take 1 capsule (20 mg total) by mouth daily. 06/14/22   Pickenpack-Cousar, Carlena Sax, NP  EPINEPHrine 0.3 mg/0.3 mL IJ SOAJ injection Inject 0.3 mg into the muscle as needed for anaphylaxis. 01/20/22   Smoot, Leary Roca, PA-C  ibuprofen (ADVIL) 400 MG tablet Take 1 tablet (400 mg total) by mouth every 8 (eight) hours as  needed. 07/02/22   Pickenpack-Cousar, Carlena Sax, NP  KRILL OIL PO Take by mouth.    [provider]  lidocaine (LIDODERM) 5 % Place 1 patch onto the skin daily. Remove & Discard patch within 12 hours or as directed by MD 01/05/22   Pickenpack-Cousar, Carlena Sax, NP  morphine (MS CONTIN) 15 MG 12 hr tablet Take 1 tablet (15 mg total) by mouth every 12 (twelve) hours. 07/02/22   Pickenpack-Cousar, Carlena Sax, NP  morphine (MSIR) 15 MG tablet Take 1 tablet (15 mg total) by mouth every 4 (four) hours as needed for severe pain. 06/04/22   Pickenpack-Cousar, Carlena Sax, NP  multivitamin-lutein (OCUVITE-LUTEIN) CAPS capsule Take 1 capsule by mouth daily.    [provider]  predniSONE (DELTASONE) 5 MG tablet Take 1 tablet (5 mg total) by mouth daily with breakfast. 05/23/22   Wyatt Portela, MD  prochlorperazine (COMPAZINE) 10 MG tablet Take 1 tablet (10 mg total) by mouth every 6 (six) hours as needed for nausea or vomiting. 01/23/22   Wyatt Portela, MD  propranolol (INDERAL) 40 MG tablet Take 1 tablet by mouth daily.    [provider]  vitamin B-12 (CYANOCOBALAMIN) 1000 MCG tablet Take 1,000 mcg by mouth daily.    [provider]      Allergies    Gabapentin and Celebrex [celecoxib]    Review of Systems   Review  of Systems  Unable to perform ROS: Mental status change    Physical Exam Updated Vital Signs BP 137/81   Pulse (!) 108   Temp 97.8 F (36.6 C)   Resp 19   Ht '5\' 8"'$  (1.727 m)   Wt 76 kg   SpO2 100%   BMI 25.48 kg/m  Physical Exam Vitals and nursing note reviewed.  Constitutional:      Appearance: He is well-developed.  HENT:     Head: Normocephalic and atraumatic.  Eyes:     Pupils: Pupils are equal, round, and reactive to light.  Cardiovascular:     Rate and Rhythm: Regular rhythm. Tachycardia present.     Heart sounds: Normal heart sounds.  Pulmonary:     Effort: Pulmonary effort is normal.     Breath sounds: Normal breath sounds.   Abdominal:     General: There is no distension.     Palpations: Abdomen is soft.     Tenderness: There is no abdominal tenderness.  Skin:    General: Skin is warm and dry.  Neurological:     Mental Status: He is alert.     Comments: Patient is sleepy but easily awakens.  He knows that Saturday, July 2023.  Equal strength in all 4 extremities.     ED Results / Procedures / Treatments   Labs (all labs ordered are listed, but only abnormal results are displayed) Labs Reviewed  ACETAMINOPHEN LEVEL - Abnormal; Notable for the following components:      Result Value   Acetaminophen (Tylenol), Serum <10 (*)    All other components within normal limits  SALICYLATE LEVEL - Abnormal; Notable for the following components:   Salicylate Lvl <1.6 (*)    All other components within normal limits  CBC WITH DIFFERENTIAL/PLATELET - Abnormal; Notable for the following components:   RBC 4.05 (*)    MCV 103.5 (*)    All other components within normal limits  COMPREHENSIVE METABOLIC PANEL - Abnormal; Notable for the following components:   Chloride 115 (*)    Glucose, Bld 122 (*)    Calcium 8.0 (*)    All other components within normal limits  BLOOD GAS, ARTERIAL - Abnormal; Notable for the following components:   pH, Arterial 7.3 (*)    pO2, Arterial 172 (*)    Acid-base deficit 3.1 (*)    All other components within normal limits  CBG MONITORING, ED - Abnormal; Notable for the following components:   Glucose-Capillary 147 (*)    All other components within normal limits  RESP PANEL BY RT-PCR (FLU A&B, COVID) [id number]  ETHANOL  RAPID URINE DRUG SCREEN, HOSP PERFORMED  URINALYSIS, ROUTINE W REFLEX MICROSCOPIC    EKG EKG Interpretation  Date/Time:  Saturday July 21 2022 21:11:59 EDT Ventricular Rate:  100 PR Interval:  183 QRS Duration: 117 QT Interval:  358 QTC Calculation: 462 R Axis:   30 Text Interpretation: Sinus tachycardia Nonspecific intraventricular conduction delay No old  tracing to compare Confirmed by Sherwood Gambler [id number]) on 07/21/2022 10:35:52 PM  Radiology CT Head Wo Contrast  Result Date: 07/21/2022 CLINICAL DATA:  Drug overdose with mental status changes. EXAM: CT HEAD WITHOUT CONTRAST TECHNIQUE: Contiguous axial images were obtained from the base of the skull through the vertex without intravenous contrast. RADIATION DOSE REDUCTION: This exam was performed according to the departmental dose-optimization program which includes automated exposure control, adjustment of the mA and/or kV according to patient size and/or use of iterative reconstruction technique. COMPARISON:  None Available. FINDINGS: Brain: There is mild cerebral atrophy with mild atrophic ventriculomegaly and moderate to severe small vessel disease of the cerebral white matter. There is slight cerebellar atrophy. No focal asymmetry is seen concerning for acute cortical based infarct, hemorrhage or mass. There is no midline shift. Basal cisterns are clear. Vascular: Mild carotid atherosclerosis. No hyperdense central vessel is seen. Scattered calcification distal vertebral arteries. Skull: No fracture or focal lesion. Sinuses/Orbits: Unremarkable orbital contents. There is moderate membrane thickening in the left maxillary sinus. Other visible sinuses, bilateral mastoid air cells are clear. There is a reverse S shaped nasal septum with right-sided spurring. Other: None. IMPRESSION: 1. No acute intracranial CT findings. 2. Atrophy and small-vessel disease. 3. Sinus disease. Electronically Signed   By: Telford Nab M.D.   On: 07/21/2022 23:21   DG Chest Portable 1 View  Result Date: 07/21/2022 CLINICAL DATA:  Hypoxia and drug overdose EXAM: PORTABLE CHEST 1 VIEW COMPARISON:  07/18/2022 FINDINGS: Cardiac shadow is enlarged but stable. Aortic calcifications are seen. The lungs are hypoinflated but clear. No acute bony abnormality is noted. 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 IMPRESSION: No acute abnormality noted. Electronically Signed   By: Inez Catalina M.D.   On: 07/21/2022 22:02    Procedures Procedures    Medications Ordered in ED Medications - No data to display  ED Course/ Medical Decision Making/ A&P                           Medical Decision Making Amount and/or Complexity of Data Reviewed Labs: ordered. Radiology: ordered.   Patient is sleepy but awake and alert.  He was mildly hypothermic though this resolved with bear hugger applied by nursing staff.  I discussed with the husband at the bedside and it seems like this was a sudden change in his mental status that was attributed to multiple missing narcotic pills.  He has long-acting morphine but appears to have taken multiple short acting narcotics as above.  His work-up is fairly unremarkable with a clear chest x-ray and negative head CT, both of which I have viewed/interpreted.  He is mildly tachycardic which is a little abnormal for a narcotic overdose.  His COVID testing is negative.  However he still requiring 2 L of oxygen despite further time.  He is not having shallow respirations to make me think that he has a significant narcotic overdose driving all of it.  Thus I think he might need to be admitted to the hospital for the hypoxia and will need psychiatry consult.  Given his active cancer, CTA will be obtained though this does not seem as likely to be PE.  Consulted hospitalist, Dr. Velia Meyer, who will admit        Final Clinical Impression(s) / ED Diagnoses Final diagnoses:  Narcotic overdose, intentional self-harm, initial encounter [facility])    Rx / DC Orders ED Discharge Orders     None         Sherwood Gambler, MD 07/21/22 2349

## 2022-07-22 ENCOUNTER — Encounter (HOSPITAL_COMMUNITY): Payer: Self-pay | Admitting: Internal Medicine

## 2022-07-22 DIAGNOSIS — F411 Generalized anxiety disorder: Secondary | ICD-10-CM | POA: Diagnosis present

## 2022-07-22 DIAGNOSIS — J9602 Acute respiratory failure with hypercapnia: Secondary | ICD-10-CM | POA: Diagnosis not present

## 2022-07-22 DIAGNOSIS — J9601 Acute respiratory failure with hypoxia: Secondary | ICD-10-CM | POA: Diagnosis not present

## 2022-07-22 DIAGNOSIS — T402X2A Poisoning by other opioids, intentional self-harm, initial encounter: Secondary | ICD-10-CM

## 2022-07-22 DIAGNOSIS — G928 Other toxic encephalopathy: Secondary | ICD-10-CM | POA: Diagnosis not present

## 2022-07-22 DIAGNOSIS — E785 Hyperlipidemia, unspecified: Secondary | ICD-10-CM | POA: Diagnosis not present

## 2022-07-22 DIAGNOSIS — F4323 Adjustment disorder with mixed anxiety and depressed mood: Secondary | ICD-10-CM | POA: Diagnosis present

## 2022-07-22 DIAGNOSIS — Z66 Do not resuscitate: Secondary | ICD-10-CM | POA: Diagnosis not present

## 2022-07-22 DIAGNOSIS — J9811 Atelectasis: Secondary | ICD-10-CM | POA: Diagnosis not present

## 2022-07-22 DIAGNOSIS — G20 Parkinson's disease: Secondary | ICD-10-CM | POA: Diagnosis not present

## 2022-07-22 DIAGNOSIS — Z20822 Contact with and (suspected) exposure to covid-19: Secondary | ICD-10-CM | POA: Diagnosis not present

## 2022-07-22 DIAGNOSIS — E860 Dehydration: Secondary | ICD-10-CM | POA: Diagnosis not present

## 2022-07-22 DIAGNOSIS — N19 Unspecified kidney failure: Secondary | ICD-10-CM | POA: Diagnosis present

## 2022-07-22 DIAGNOSIS — Z79899 Other long term (current) drug therapy: Secondary | ICD-10-CM | POA: Diagnosis not present

## 2022-07-22 DIAGNOSIS — T40602A Poisoning by unspecified narcotics, intentional self-harm, initial encounter: Secondary | ICD-10-CM | POA: Diagnosis not present

## 2022-07-22 DIAGNOSIS — Z7952 Long term (current) use of systemic steroids: Secondary | ICD-10-CM | POA: Diagnosis not present

## 2022-07-22 DIAGNOSIS — T50902A Poisoning by unspecified drugs, medicaments and biological substances, intentional self-harm, initial encounter: Secondary | ICD-10-CM | POA: Diagnosis present

## 2022-07-22 DIAGNOSIS — G9340 Encephalopathy, unspecified: Secondary | ICD-10-CM | POA: Diagnosis present

## 2022-07-22 DIAGNOSIS — I119 Hypertensive heart disease without heart failure: Secondary | ICD-10-CM | POA: Diagnosis not present

## 2022-07-22 DIAGNOSIS — Z79891 Long term (current) use of opiate analgesic: Secondary | ICD-10-CM | POA: Diagnosis not present

## 2022-07-22 DIAGNOSIS — C7951 Secondary malignant neoplasm of bone: Secondary | ICD-10-CM | POA: Diagnosis not present

## 2022-07-22 DIAGNOSIS — Z8546 Personal history of malignant neoplasm of prostate: Secondary | ICD-10-CM | POA: Diagnosis not present

## 2022-07-22 DIAGNOSIS — T50901A Poisoning by unspecified drugs, medicaments and biological substances, accidental (unintentional), initial encounter: Secondary | ICD-10-CM | POA: Diagnosis present

## 2022-07-22 DIAGNOSIS — R680 Hypothermia, not associated with low environmental temperature: Secondary | ICD-10-CM | POA: Diagnosis present

## 2022-07-22 DIAGNOSIS — D7589 Other specified diseases of blood and blood-forming organs: Secondary | ICD-10-CM | POA: Diagnosis present

## 2022-07-22 DIAGNOSIS — Z888 Allergy status to other drugs, medicaments and biological substances status: Secondary | ICD-10-CM | POA: Diagnosis not present

## 2022-07-22 DIAGNOSIS — Z820 Family history of epilepsy and other diseases of the nervous system: Secondary | ICD-10-CM | POA: Diagnosis not present

## 2022-07-22 LAB — COMPREHENSIVE METABOLIC PANEL
ALT: 16 U/L (ref 0–44)
AST: 17 U/L (ref 15–41)
Albumin: 4.3 g/dL (ref 3.5–5.0)
Alkaline Phosphatase: 54 U/L (ref 38–126)
Anion gap: 7 (ref 5–15)
BUN: 13 mg/dL (ref 8–23)
CO2: 27 mmol/L (ref 22–32)
Calcium: 8.4 mg/dL — ABNORMAL LOW (ref 8.9–10.3)
Chloride: 108 mmol/L (ref 98–111)
Creatinine, Ser: 0.63 mg/dL (ref 0.61–1.24)
GFR, Estimated: >60 mL/min (ref >60)
Glucose, Bld: 120 mg/dL — ABNORMAL HIGH (ref 70–99)
Potassium: 4.2 mmol/L (ref 3.5–5.1)
Sodium: 142 mmol/L (ref 135–145)
Total Bilirubin: 0.9 mg/dL (ref 0.3–1.2)
Total Protein: 6.8 g/dL (ref 6.5–8.1)

## 2022-07-22 LAB — CBC WITH DIFFERENTIAL/PLATELET
Abs Immature Granulocytes: 0.02 10*3/uL (ref 0.00–0.07)
Basophils Absolute: 0 10*3/uL (ref 0.0–0.1)
Basophils Relative: 0 %
Eosinophils Absolute: 0.2 10*3/uL (ref 0.0–0.5)
Eosinophils Relative: 3 %
HCT: 40.7 % (ref 39.0–52.0)
Hemoglobin: 12.9 g/dL — ABNORMAL LOW (ref 13.0–17.0)
Immature Granulocytes: 0 %
Lymphocytes Relative: 19 %
Lymphs Abs: 1.2 10*3/uL (ref 0.7–4.0)
MCH: 33.8 pg (ref 26.0–34.0)
MCHC: 31.7 g/dL (ref 30.0–36.0)
MCV: 106.5 fL — ABNORMAL HIGH (ref 80.0–100.0)
Monocytes Absolute: 0.6 10*3/uL (ref 0.1–1.0)
Monocytes Relative: 10 %
Neutro Abs: 4.2 10*3/uL (ref 1.7–7.7)
Neutrophils Relative %: 68 %
Platelets: 209 10*3/uL (ref 150–400)
RBC: 3.82 MIL/uL — ABNORMAL LOW (ref 4.22–5.81)
RDW: 13.2 % (ref 11.5–15.5)
WBC: 6.2 10*3/uL (ref 4.0–10.5)
nRBC: 0 % (ref 0.0–0.2)

## 2022-07-22 LAB — BLOOD GAS, VENOUS
Acid-Base Excess: 0 mmol/L (ref 0.0–2.0)
Acid-base deficit: 1 mmol/L (ref 0.0–2.0)
Bicarbonate: 30 mmol/L — ABNORMAL HIGH (ref 20.0–28.0)
Bicarbonate: 27.4 mmol/L (ref 20.0–28.0)
O2 Saturation: 31.3 %
O2 Saturation: 57.5 %
Patient temperature: 36.3
Patient temperature: 36.8
pCO2, Ven: 73 mmHg (ref 44–60)
pCO2, Ven: 60 mmHg (ref 44–60)
pH, Ven: 7.22 — ABNORMAL LOW (ref 7.25–7.43)
pH, Ven: 7.26 (ref 7.25–7.43)
pO2, Ven: <31 mmHg — CL (ref 32–45)
pO2, Ven: 34 mmHg (ref 32–45)

## 2022-07-22 LAB — RAPID URINE DRUG SCREEN, HOSP PERFORMED
Amphetamines: NOT DETECTED
Barbiturates: NOT DETECTED
Benzodiazepines: NOT DETECTED
Cocaine: NOT DETECTED
Opiates: POSITIVE — AB
Tetrahydrocannabinol: NOT DETECTED

## 2022-07-22 LAB — URINALYSIS, ROUTINE W REFLEX MICROSCOPIC
Bilirubin Urine: NEGATIVE
Glucose, UA: NEGATIVE mg/dL
Hgb urine dipstick: NEGATIVE
Ketones, ur: NEGATIVE mg/dL
Leukocytes,Ua: NEGATIVE
Nitrite: NEGATIVE
Protein, ur: NEGATIVE mg/dL
Specific Gravity, Urine: 1.033 — ABNORMAL HIGH (ref 1.005–1.030)
pH: 5 (ref 5.0–8.0)

## 2022-07-22 LAB — PHOSPHORUS: Phosphorus: 4.1 mg/dL (ref 2.5–4.6)

## 2022-07-22 LAB — TSH: TSH: 5.061 u[IU]/mL — ABNORMAL HIGH (ref 0.350–4.500)

## 2022-07-22 LAB — MAGNESIUM
Magnesium: 2 mg/dL (ref 1.7–2.4)
Magnesium: 2.6 mg/dL — ABNORMAL HIGH (ref 1.7–2.4)

## 2022-07-22 LAB — SALICYLATE LEVEL: Salicylate Lvl: <7 mg/dL — ABNORMAL LOW (ref 7.0–30.0)

## 2022-07-22 LAB — PROTIME-INR
INR: 1 (ref 0.8–1.2)
Prothrombin Time: 13.1 seconds (ref 11.4–15.2)

## 2022-07-22 LAB — AMMONIA: Ammonia: <10 umol/L (ref 9–35)

## 2022-07-22 LAB — ACETAMINOPHEN LEVEL: Acetaminophen (Tylenol), Serum: <10 ug/mL — ABNORMAL LOW (ref 10–30)

## 2022-07-22 LAB — CK: Total CK: 61 U/L (ref 49–397)

## 2022-07-22 LAB — BRAIN NATRIURETIC PEPTIDE: B Natriuretic Peptide: 68 pg/mL (ref 0.0–100.0)

## 2022-07-22 MED ORDER — PROPRANOLOL HCL 20 MG PO TABS
40.0000 mg | ORAL_TABLET | Freq: Every day | ORAL | Status: DC
  Administered 2022-07-24 – 2022-07-25 (×2): 40 mg via ORAL
  Filled 2022-07-22 (×3): qty 2

## 2022-07-22 MED ORDER — SENNOSIDES-DOCUSATE SODIUM 8.6-50 MG PO TABS: 1.0000 | ORAL_TABLET | Freq: Every evening | ORAL | Status: DC | PRN

## 2022-07-22 MED ORDER — HYDRALAZINE HCL 20 MG/ML IJ SOLN: 10.0000 mg | INTRAMUSCULAR | Status: DC | PRN

## 2022-07-22 MED ORDER — IPRATROPIUM-ALBUTEROL 0.5-2.5 (3) MG/3ML IN SOLN: 3.0000 mL | RESPIRATORY_TRACT | Status: DC | PRN

## 2022-07-22 MED ORDER — DULOXETINE HCL 20 MG PO CPEP
20.0000 mg | ORAL_CAPSULE | Freq: Every day | ORAL | Status: DC
  Administered 2022-07-22 – 2022-07-26 (×5): 20 mg via ORAL
  Filled 2022-07-22 (×5): qty 1

## 2022-07-22 MED ORDER — METOPROLOL TARTRATE 5 MG/5ML IV SOLN: 5.0000 mg | INTRAVENOUS | Status: DC | PRN

## 2022-07-22 MED ORDER — ORAL CARE MOUTH RINSE: 15.0000 mL | OROMUCOSAL | Status: DC | PRN

## 2022-07-22 MED ORDER — PREDNISONE 5 MG PO TABS
5.0000 mg | ORAL_TABLET | Freq: Every day | ORAL | Status: DC
  Administered 2022-07-23 – 2022-07-26 (×4): 5 mg via ORAL
  Filled 2022-07-22 (×4): qty 1

## 2022-07-22 MED ORDER — ATORVASTATIN CALCIUM 40 MG PO TABS
40.0000 mg | ORAL_TABLET | Freq: Every day | ORAL | Status: DC
  Administered 2022-07-22 – 2022-07-25 (×4): 40 mg via ORAL
  Filled 2022-07-22 (×4): qty 1

## 2022-07-22 MED ORDER — GUAIFENESIN 100 MG/5ML PO LIQD: 5.0000 mL | ORAL | Status: DC | PRN

## 2022-07-22 MED ORDER — IOHEXOL 350 MG/ML SOLN
100.0000 mL | Freq: Once | INTRAVENOUS | Status: AC | PRN
  Administered 2022-07-22: 100 mL via INTRAVENOUS

## 2022-07-22 MED ORDER — CARBIDOPA-LEVODOPA 25-100 MG PO TABS
1.0000 | ORAL_TABLET | ORAL | Status: DC
  Administered 2022-07-22 – 2022-07-26 (×12): 1 via ORAL
  Filled 2022-07-22 (×14): qty 1

## 2022-07-22 MED ORDER — ABIRATERONE ACETATE 250 MG PO TABS
1000.0000 mg | ORAL_TABLET | Freq: Every day | ORAL | Status: DC
  Administered 2022-07-23 – 2022-07-25 (×3): 1000 mg via ORAL

## 2022-07-22 NOTE — Consult Note (Signed)
Face-to-Face Psychiatry Consult   Reason for Consult:  suicide attempt via overdose Referring Physician:  Dr. Quincy Sheehan  Patient Identification: Frederick Ponce MRN:  [id number] Principal Diagnosis: Acute respiratory failure with hypoxia [facility]) Diagnosis:  Principal Problem:   Acute respiratory failure with hypoxia ([place]) Active Problems:   HLD (hyperlipidemia)   Parkinson's disease ([place])   Acute encephalopathy   Drug overdose, intentional, initial encounter ([place])   Acute prerenal azotemia   GAD (generalized anxiety disorder)   Total Time spent with patient: 1 hour  Subjective:   Frederick Ponce is a 76 y.o. male with medical history significant for metastatic prostate cancer, hyperlipidemia, Parkinson's disease, generalized anxiety disorder, who is admitted to [facility] on 07/21/2022 with acute hypoxic respiratory distress after presenting from home to [facility] ED for evaluation of altered mental status.    The following history is provided by the patient as well as the patient's husband, in addition to Dr. Brett Albino discussions with the EDP and via chart review: Patient's husband had noted the patient to be confused relative to baseline mental status starting around 1600 on 07/21/2022.  This was associated with an element of somnolence/lethargy that represents a deviation from the patient's usual energy/activity level.  In assessing these changes, has been conveys that the patient pointed to the trash can where he was reportedly found 3 empty pill bottles, reportedly tramadol, oxycodone IR, and morphine IR.  While specific pill counts are unclear, has been conveyed to the patient should not yet been out of these medications.  On further assessing the environment at home as a result of several of the surgical conditions in the trash can, has been conveys that he is not found a note written recently by the patient taking responsibility for this overdose, including rationale including  inability to continue to tolerate pain and life circumstances that he faces.  At that point, patient was brought to [facility] emergency department for further evaluation and management of his altered mental status.     HPI:    On my evaluation, the patient was initially very confused for the first 25 minutes or so of the interview.  To any question prompted, he would only respond with "my dad was gay" "I am the oldest of 6 kids" "I thought of myself as being gay for as long as I can remember".  The patient denied ability to respond to straightforward questions regarding his psychiatric history, social history, family history, substance use history, and current psychiatric review of systems impaired the ability to assess for this information.  Nonetheless, the patient reports that he does recall taking the medications.  The patient admits to having suicidal thoughts with intent to kill himself by overdosing.  The patient recalls thinking to himself "what am i doing".  Initially the patient denies the overdose as due to overwhelm about his cancer treatment, progression, prognosis, and pain; then he does admits to this later on during the interview.  He reports that he overdosed "because I could not deal with it".  The patient reports that his mood has been dysphoric off and on for sometime.  But denies pervasive sadness during the last 2 weeks.  Denies anhedonia.  He reports that his sleep is poor but is unable to elaborate further and starts talking about him and his partner installing a fence in the backyard.  He otherwise does not comment on symptoms regarding energy, sleep, motivation.  He does deny HI.  He does deny having  suicidal thoughts at this time.  The patient seems genuinely surprised that his overdose had serious medical consequences.  Otherwise the patient does not comment on having symptoms of anxiety, panic attacks, mania or psychosis when asked, and tangentially responds with comments about  his father and himself being gay.    Past Psychiatric History: Denies previous psychiatric diagnosis.  Denies previous psychiatric hospitalization or history of suicide  Risk to Self:   Risk to Others:   Prior Inpatient Therapy:   Prior Outpatient Therapy:    Past Medical History:  Past Medical History:  Diagnosis Date   Hyperlipidemia    Parkinson disease ([place])    Prostate cancer ([place])    History reviewed. No pertinent surgical history. Family History:  Family History  Problem Relation Age of Onset   Tremor Father    Parkinson's disease Father    Melanoma Sister    Parkinson's disease Sister    Parkinson's disease Paternal Grandmother    Family Psychiatric  History: Patient is unable to answer these questions  Social History:  Social History   Substance and Sexual Activity  Alcohol Use Yes   Alcohol/week: 14.0 standard drinks of alcohol   Types: 14 Glasses of wine per week     Social History   Substance and Sexual Activity  Drug Use Never    Social History   Socioeconomic History   Marital status: Married    Spouse name: Not on file   Number of children: Not on file   Years of education: Not on file   Highest education level: Not on file  Occupational History   Not on file  Tobacco Use   Smoking status: Never   Smokeless tobacco: Never  Vaping Use   Vaping Use: Never used  Substance and Sexual Activity   Alcohol use: Yes    Alcohol/week: 14.0 standard drinks of alcohol    Types: 14 Glasses of wine per week   Drug use: Never   Sexual activity: Not on file  Other Topics Concern   Not on file  Social History Narrative   Not on file   Social Determinants of Health   Financial Resource Strain: Not on file  Food Insecurity: Not on file  Transportation Needs: Not on file  Physical Activity: Not on file  Stress: Not on file  Social Connections: Not on file   Additional Social History:    Allergies:   Allergies  Allergen Reactions    Gabapentin Other (See Comments)    Restless legs/confusion   Celebrex [Celecoxib] Rash    Labs:  Results for orders placed or performed during the hospital encounter of 07/21/22 (from the past 48 hour(s))  Urinalysis, Routine w reflex microscopic Urine, Catheterized     Status: Abnormal   Collection Time: 07/21/22 12:25 PM  Result Value Ref Range   Color, Urine YELLOW YELLOW   APPearance CLEAR CLEAR   Specific Gravity, Urine 1.033 (H) 1.005 - 1.030   pH 5.0 5.0 - 8.0   Glucose, UA NEGATIVE NEGATIVE mg/dL   Hgb urine dipstick NEGATIVE NEGATIVE   Bilirubin Urine NEGATIVE NEGATIVE   Ketones, ur NEGATIVE NEGATIVE mg/dL   Protein, ur NEGATIVE NEGATIVE mg/dL   Nitrite NEGATIVE NEGATIVE   Leukocytes,Ua NEGATIVE NEGATIVE    Comment: Performed at [facility], [place] [address] [place], [place] [address]  Urine rapid drug screen (hosp performed)     Status: Abnormal   Collection Time: 07/21/22 12:28 PM  Result Value Ref Range  Opiates POSITIVE (A) NONE DETECTED   Cocaine NONE DETECTED NONE DETECTED   Benzodiazepines NONE DETECTED NONE DETECTED   Amphetamines NONE DETECTED NONE DETECTED   Tetrahydrocannabinol NONE DETECTED NONE DETECTED   Barbiturates NONE DETECTED NONE DETECTED    Comment: (NOTE) DRUG SCREEN FOR MEDICAL PURPOSES ONLY.  IF CONFIRMATION IS NEEDED FOR ANY PURPOSE, NOTIFY LAB WITHIN 5 DAYS.  LOWEST DETECTABLE LIMITS FOR URINE DRUG SCREEN Drug Class                     Cutoff (ng/mL) Amphetamine and metabolites    1000 Barbiturate and metabolites    200 Benzodiazepine                 518 Tricyclics and metabolites     300 Opiates and metabolites        300 Cocaine and metabolites        300 THC                            50 Performed at [facility], [place] [address], [place], [place] [address]   Ethanol     Status: None   Collection Time: 07/21/22  9:34 PM  Result Value Ref Range   Alcohol, Ethyl (B) <10 <10 mg/dL     Comment: (NOTE) Lowest detectable limit for serum alcohol is 10 mg/dL.  For medical purposes only. Performed at [facility], [place] [address] [place], [place] [address]   Acetaminophen level     Status: Abnormal   Collection Time: 07/21/22  9:34 PM  Result Value Ref Range   Acetaminophen (Tylenol), Serum <10 (L) 10 - 30 ug/mL    Comment: (NOTE) Therapeutic concentrations vary significantly. A range of 10-30 ug/mL  may be an effective concentration for many patients. However, some  are best treated at concentrations outside of this range. Acetaminophen concentrations >150 ug/mL at 4 hours after ingestion  and >50 ug/mL at 12 hours after ingestion are often associated with  toxic reactions.  Performed at [facility], [place] [address] [place], [place] [address]   Salicylate level     Status: Abnormal   Collection Time: 07/21/22  9:34 PM  Result Value Ref Range   Salicylate Lvl <3.2 (L) 7.0 - 30.0 mg/dL    Comment: HEMOLYSIS AT THIS LEVEL MAY AFFECT RESULT Performed at [place] [address], [place], [place] [address]   CBC with Differential     Status: Abnormal   Collection Time: 07/21/22  9:34 PM  Result Value Ref Range   WBC 7.0 4.0 - 10.5 K/uL   RBC 4.05 (L) 4.22 - 5.81 MIL/uL   Hemoglobin 13.5 13.0 - 17.0 g/dL   HCT 41.9 39.0 - 52.0 %   MCV 103.5 (H) 80.0 - 100.0 fL   MCH 33.3 26.0 - 34.0 pg   MCHC 32.2 30.0 - 36.0 g/dL   RDW 13.1 11.5 - 15.5 %   Platelets 205 150 - 400 K/uL   nRBC 0.0 0.0 - 0.2 %   Neutrophils Relative % 81 %   Neutro Abs 5.6 1.7 - 7.7 K/uL   Lymphocytes Relative 13 %   Lymphs Abs 0.9 0.7 - 4.0 K/uL   Monocytes Relative 6 %   Monocytes Absolute 0.4 0.1 - 1.0 K/uL   Eosinophils Relative 0 %   Eosinophils Absolute 0.0 0.0 - 0.5 K/uL   Basophils Relative 0 %  Basophils Absolute 0.0 0.0 - 0.1 K/uL   Immature Granulocytes 0 %   Abs Immature Granulocytes 0.02 0.00 - 0.07 K/uL     Comment: Performed at [facility], [place] [address] [place], [place] [address]  CBG monitoring, ED     Status: Abnormal   Collection Time: 07/21/22  9:41 PM  Result Value Ref Range   Glucose-Capillary 147 (H) 70 - 99 mg/dL    Comment: Glucose reference range applies only to samples taken after fasting for at least 8 hours.  Comprehensive metabolic panel     Status: Abnormal   Collection Time: 07/21/22 10:32 PM  Result Value Ref Range   Sodium 143 135 - 145 mmol/L   Potassium 4.4 3.5 - 5.1 mmol/L    Comment: HEMOLYSIS AT THIS LEVEL MAY AFFECT RESULT   Chloride 115 (H) 98 - 111 mmol/L   CO2 22 22 - 32 mmol/L   Glucose, Bld 122 (H) 70 - 99 mg/dL    Comment: Glucose reference range applies only to samples taken after fasting for at least 8 hours.   BUN 14 8 - 23 mg/dL   Creatinine, Ser 0.67 0.61 - 1.24 mg/dL   Calcium 8.0 (L) 8.9 - 10.3 mg/dL   Total Protein 6.5 6.5 - 8.1 g/dL   Albumin 3.8 3.5 - 5.0 g/dL   AST 21 15 - 41 U/L    Comment: HEMOLYSIS AT THIS LEVEL MAY AFFECT RESULT   ALT 10 0 - 44 U/L    Comment: HEMOLYSIS AT THIS LEVEL MAY AFFECT RESULT   Alkaline Phosphatase 50 38 - 126 U/L   Total Bilirubin 1.2 0.3 - 1.2 mg/dL    Comment: HEMOLYSIS AT THIS LEVEL MAY AFFECT RESULT   GFR, Estimated >60 >60 mL/min    Comment: (NOTE) Calculated using the CKD-EPI Creatinine Equation (2021)    Anion gap 6 5 - 15    Comment: Performed at [facility], [place] [address], [place], [place] [address]  Magnesium     Status: None   Collection Time: 07/21/22 10:32 PM  Result Value Ref Range   Magnesium 2.0 1.7 - 2.4 mg/dL    Comment: Performed at [facility], [place] [address] [place],  [address]  Resp Panel by RT-PCR (Flu A&B, Covid) Anterior Nasal Swab     Status: None   Collection Time: 07/21/22 10:35 PM   Specimen: Anterior Nasal Swab  Result Value Ref Range   SARS Coronavirus 2 by RT PCR NEGATIVE NEGATIVE    Comment:  (NOTE) SARS-CoV-2 target nucleic acids are NOT DETECTED.  The SARS-CoV-2 RNA is generally detectable in upper respiratory specimens during the acute phase of infection. The lowest concentration of SARS-CoV-2 viral copies this assay can detect is 138 copies/mL. A negative result does not preclude SARS-Cov-2 infection and should not be used as the sole basis for treatment or other patient management decisions. A negative result may occur with  improper specimen collection/handling, submission of specimen other than nasopharyngeal swab, presence of viral mutation(s) within the areas targeted by this assay, and inadequate number of viral copies(<138 copies/mL). A negative result must be combined with clinical observations, patient history, and epidemiological information. The expected result is Negative.  Fact Sheet for Patients:  EntrepreneurPulse.com.au  Fact Sheet for Healthcare Providers:  IncredibleEmployment.be  This test is no t yet approved or cleared by the [place] FDA and  has been authorized for detection and/or diagnosis of SARS-CoV-2 by FDA under an Emergency  Use Authorization (EUA). This EUA will remain  in effect (meaning this test can be used) for the duration of the COVID-19 declaration under Section 564(b)(1) of the Act, 21 U.S.C.section 360bbb-3(b)(1), unless the authorization is terminated  or revoked sooner.       Influenza A by PCR NEGATIVE NEGATIVE   Influenza B by PCR NEGATIVE NEGATIVE    Comment: (NOTE) The Xpert Xpress SARS-CoV-2/FLU/RSV plus assay is intended as an aid in the diagnosis of influenza from Nasopharyngeal swab specimens and should not be used as a sole basis for treatment. Nasal washings and aspirates are unacceptable for Xpert Xpress SARS-CoV-2/FLU/RSV testing.  Fact Sheet for Patients: EntrepreneurPulse.com.au  Fact Sheet for Healthcare  Providers: IncredibleEmployment.be  This test is not yet approved or cleared by the [place] FDA and has been authorized for detection and/or diagnosis of SARS-CoV-2 by FDA under an Emergency Use Authorization (EUA). This EUA will remain in effect (meaning this test can be used) for the duration of the COVID-19 declaration under Section 564(b)(1) of the Act, 21 U.S.C. section 360bbb-3(b)(1), unless the authorization is terminated or revoked.  Performed at [facility], [place] [address], [place], [place] [address]   Blood gas, arterial     Status: Abnormal   Collection Time: 07/21/22 11:15 PM  Result Value Ref Range   pH, Arterial 7.3 (L) 7.35 - 7.45   pCO2 arterial 48 32 - 48 mmHg   pO2, Arterial 172 (H) 83 - 108 mmHg   Bicarbonate 23.6 20.0 - 28.0 mmol/L   Acid-base deficit 3.1 (H) 0.0 - 2.0 mmol/L   O2 Saturation 99.2 %   Patient temperature 37.0    Allens test (pass/fail) PASS PASS    Comment: Performed at [facility] [place] [address] [place], [place] [address]  CBC with Differential/Platelet     Status: Abnormal   Collection Time: 07/22/22  6:17 AM  Result Value Ref Range   WBC 6.2 4.0 - 10.5 K/uL   RBC 3.82 (L) 4.22 - 5.81 MIL/uL   Hemoglobin 12.9 (L) 13.0 - 17.0 g/dL   HCT 40.7 39.0 - 52.0 %   MCV 106.5 (H) 80.0 - 100.0 fL   MCH 33.8 26.0 - 34.0 pg   MCHC 31.7 30.0 - 36.0 g/dL   RDW 13.2 11.5 - 15.5 %   Platelets 209 150 - 400 K/uL   nRBC 0.0 0.0 - 0.2 %   Neutrophils Relative % 68 %   Neutro Abs 4.2 1.7 - 7.7 K/uL   Lymphocytes Relative 19 %   Lymphs Abs 1.2 0.7 - 4.0 K/uL   Monocytes Relative 10 %   Monocytes Absolute 0.6 0.1 - 1.0 K/uL   Eosinophils Relative 3 %   Eosinophils Absolute 0.2 0.0 - 0.5 K/uL   Basophils Relative 0 %   Basophils Absolute 0.0 0.0 - 0.1 K/uL   Immature Granulocytes 0 %   Abs Immature Granulocytes 0.02 0.00 - 0.07 K/uL    Comment: Performed at [facility], [place] [address] [place], [place] [address]  Comprehensive metabolic panel     Status: Abnormal   Collection Time: 07/22/22  6:17 AM  Result Value Ref Range   Sodium 142 135 - 145 mmol/L   Potassium 4.2 3.5 - 5.1 mmol/L   Chloride 108 98 - 111 mmol/L   CO2 27 22 - 32 mmol/L   Glucose, Bld 120 (H) 70 - 99 mg/dL    Comment: Glucose reference range applies only to  samples taken after fasting for at least 8 hours.   BUN 13 8 - 23 mg/dL   Creatinine, Ser 0.63 0.61 - 1.24 mg/dL   Calcium 8.4 (L) 8.9 - 10.3 mg/dL   Total Protein 6.8 6.5 - 8.1 g/dL   Albumin 4.3 3.5 - 5.0 g/dL   AST 17 15 - 41 U/L   ALT 16 0 - 44 U/L   Alkaline Phosphatase 54 38 - 126 U/L   Total Bilirubin 0.9 0.3 - 1.2 mg/dL   GFR, Estimated >60 >60 mL/min    Comment: (NOTE) Calculated using the CKD-EPI Creatinine Equation (2021)    Anion gap 7 5 - 15    Comment: Performed at [facility], [place] [address], [place], [place] [address]  Magnesium     Status: Abnormal   Collection Time: 07/22/22  6:17 AM  Result Value Ref Range   Magnesium 2.6 (H) 1.7 - 2.4 mg/dL    Comment: Performed at [facility], [place] [address], [place], [place] [address]  Phosphorus     Status: None   Collection Time: 07/22/22  6:17 AM  Result Value Ref Range   Phosphorus 4.1 2.5 - 4.6 mg/dL    Comment: Performed at [facility], [place] [address], [place], [place] [address]  Blood gas, venous     Status: Abnormal   Collection Time: 07/22/22  6:17 AM  Result Value Ref Range   pH, Ven 7.22 (L) 7.25 - 7.43   pCO2, Ven 73 (HH) 44 - 60 mmHg    Comment: CRITICAL RESULT CALLED TO, READ BACK BY AND VERIFIED WITH: C.MULAR RN AT 0753 ON 07/22/22 BY S.VANHOORNE    pO2, Ven <31 (LL) 32 - 45 mmHg    Comment: CRITICAL RESULT CALLED TO, READ BACK BY AND VERIFIED WITH: C.MULAR RN AT 0753 ON 07/22/22 BY S.VANHOORNE    Bicarbonate 30.0 (H) 20.0 - 28.0 mmol/L   Acid-Base Excess 0.0 0.0 - 2.0 mmol/L    O2 Saturation 31.3 %   Patient temperature 36.3     Comment: Performed at [facility], [place] [address], [place], [place] [address]  Brain natriuretic peptide     Status: None   Collection Time: 07/22/22  6:17 AM  Result Value Ref Range   B Natriuretic Peptide 68.0 0.0 - 100.0 pg/mL    Comment: Performed at [facility], [place] [address], [place], [place] [address]  Ammonia     Status: None   Collection Time: 07/22/22  6:17 AM  Result Value Ref Range   Ammonia <10 9 - 35 umol/L    Comment: Performed at [facility], [place] [address] [place], [place] [address]  CK     Status: None   Collection Time: 07/22/22  6:17 AM  Result Value Ref Range   Total CK 61 49 - 397 U/L    Comment: Performed at [facility], [place] [address], [place], [place] [address]  TSH     Status: Abnormal   Collection Time: 07/22/22  6:17 AM  Result Value Ref Range   TSH 5.061 (H) 0.350 - 4.500 uIU/mL    Comment: Performed by a 3rd Generation assay with a functional sensitivity of <=0.01 uIU/mL. Performed at [facility], [place] [address] [place], [place] [address]   Protime-INR     Status: None   Collection Time: 07/22/22  6:17 AM  Result Value Ref Range   Prothrombin Time 13.1  11.4 - 15.2 seconds   INR 1.0 0.8 - 1.2    Comment: (NOTE) INR goal varies based on device and disease states. Performed at [facility], [place] [address] [place], [place] [address]   Blood gas, venous     Status: None   Collection Time: 07/22/22 10:43 AM  Result Value Ref Range   pH, Ven 7.26 7.25 - 7.43   pCO2, Ven 60 44 - 60 mmHg   pO2, Ven 34 32 - 45 mmHg   Bicarbonate 27.4 20.0 - 28.0 mmol/L   Acid-base deficit 1.0 0.0 - 2.0 mmol/L   O2 Saturation 57.5 %   Patient temperature 36.8     Comment: Performed at [facility], [place] [address], [place], [place] [address]  Acetaminophen level     Status:  Abnormal   Collection Time: 07/22/22  1:05 PM  Result Value Ref Range   Acetaminophen (Tylenol), Serum <10 (L) 10 - 30 ug/mL    Comment: (NOTE) Therapeutic concentrations vary significantly. A range of 10-30 ug/mL  may be an effective concentration for many patients. However, some  are best treated at concentrations outside of this range. Acetaminophen concentrations >150 ug/mL at 4 hours after ingestion  and >50 ug/mL at 12 hours after ingestion are often associated with  toxic reactions.  Performed at [facility], [place] [address] [place], [place] [address]   Salicylate level     Status: Abnormal   Collection Time: 07/22/22  1:05 PM  Result Value Ref Range   Salicylate Lvl <1.7 (L) 7.0 - 30.0 mg/dL    Comment: Performed at [facility], [place] [address] [place],  [address]    Current Facility-Administered Medications  Medication Dose Route Frequency Provider Last Rate Last Admin   0.9 %  sodium chloride infusion   Intravenous Continuous Sherwood Gambler, MD 125 mL/hr at 07/22/22 1012 New Bag at 07/22/22 1012   abiraterone acetate (ZYTIGA) tablet 1,000 mg  1,000 mg Oral Daily Howerter, Justin B, DO       acetaminophen (TYLENOL) tablet 650 mg  650 mg Oral Q6H PRN Howerter, Justin B, DO       Or   acetaminophen (TYLENOL) suppository 650 mg  650 mg Rectal Q6H PRN Howerter, Justin B, DO       atorvastatin (LIPITOR) tablet 40 mg  40 mg Oral QHS Howerter, Justin B, DO       carbidopa-levodopa (SINEMET IR) 25-100 MG per tablet immediate release 1 tablet  1 tablet Oral 3 times per day Howerter, Justin B, DO   1 tablet at 07/22/22 1052   DULoxetine (CYMBALTA) DR capsule 20 mg  20 mg Oral Daily Howerter, Justin B, DO   20 mg at 07/22/22 1051   guaiFENesin (ROBITUSSIN) 100 MG/5ML liquid 5 mL  5 mL Oral Q4H PRN Amin, Ankit Chirag, MD       hydrALAZINE (APRESOLINE) injection 10 mg  10 mg Intravenous Q4H PRN Amin, Ankit Chirag, MD        ipratropium-albuterol (DUONEB) 0.5-2.5 (3) MG/3ML nebulizer solution 3 mL  3 mL Nebulization Q4H PRN Amin, Ankit Chirag, MD       metoprolol tartrate (LOPRESSOR) injection 5 mg  5 mg Intravenous Q4H PRN Amin, Ankit Chirag, MD       naloxone (NARCAN) injection 0.4 mg  0.4 mg Intravenous PRN Howerter, Justin B, DO       Oral care mouth rinse  15 mL Mouth Rinse PRN Amin, Ankit  Chirag, MD       Derrill Memo ON 07/23/2022] predniSONE (DELTASONE) tablet 5 mg  5 mg Oral Q breakfast Amin, Ankit Chirag, MD       propranolol (INDERAL) tablet 40 mg  40 mg Oral Daily Howerter, Justin B, DO       senna-docusate (Senokot-S) tablet 1 tablet  1 tablet Oral QHS PRN Amin, Jeanella Flattery, MD              Psychiatric Specialty Exam:  Presentation  General Appearance: Disheveled  Eye Contact:Fair  Speech:Slow  Speech Volume:Decreased  Handedness:No data recorded  Mood and Affect  Mood:Anxious; Dysphoric  Affect:Congruent; Depressed   Thought Process  Thought Processes:-- (at first the thought process was tangtial for about 25 minutes then bacame more linear (tangentiality remained but as severe))  Descriptions of Associations:No data recorded Orientation:No data recorded Thought Content:Illogical  History of Schizophrenia/Schizoaffective disorder:No data recorded Duration of Psychotic Symptoms:No data recorded Hallucinations:Hallucinations: None  Ideas of Reference:None  Suicidal Thoughts:Suicidal Thoughts: No  Homicidal Thoughts:Homicidal Thoughts: No   Sensorium  Memory:-- (fluctuated throughout the interview)  Judgment:Impaired  Insight:Lacking   Executive Functions  Concentration:Poor  Attention Span:Poor  [place] recorded Language:No data recorded  Psychomotor Activity  Psychomotor Activity:No data recorded  Assets  Assets:No data recorded  Sleep  Sleep:Sleep: Poor   Physical Exam: Physical Exam Vitals reviewed.     Review of Systems  Psychiatric/Behavioral:  Positive for suicidal ideas. The patient is nervous/anxious and has insomnia.    Blood pressure (!) 162/84, pulse 90, temperature 97.6 F (36.4 C), temperature source Oral, resp. rate 20, height '5\' 8"'$  (1.727 m), weight 69.2 kg, SpO2 99 %. Body mass index is 23.2 kg/m.  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  Patient meets criteria for inpatient psychiatric hospitalization once he is medically cleared.  Patient had severe suicide attempt, with high lethality potential.  He does not appear to understand the potential for this suicide attempt. -Consult social work for referral for inpatient psychiatric hospitalization, when he is medically cleared -Continue Cymbalta 20 mg once daily   Disposition: Recommend psychiatric Inpatient admission when medically cleared.  Christoper Allegra, MD 07/22/2022 2:38 PM  Total Time Spent in Direct Patient Care:  I personally spent 75 minutes on the unit in direct patient care. The direct patient care time included face-to-face time with the patient, reviewing the patient's chart, communicating with other professionals, and coordinating care. Greater than 50% of this time was spent in counseling or coordinating care with the patient regarding goals of hospitalization, psycho-education, and discharge planning needs.   Janine Limbo, MD Psychiatrist

## 2022-07-22 NOTE — Progress Notes (Signed)
Pt admitted from ED to 1620, accompanied by suicide precautions sitter. Room set up per suicide precautions. Pt alert to self and date, but doesn't understand reason for admission and doesn't remember the actions that brought him to the hospital. Pts spouse accompanied pt to unit but went home shortly thereafter, and pt falls asleep quickly. Admission history incomplete for these reasons. Telemetry, continuous pulse ox, and IV fluids set up, and sitter at bedside. Continue to monitor. Hortencia Conradi RN

## 2022-07-22 NOTE — H&P (Incomplete)
History and Physical    PLEASE NOTE THAT DRAGON DICTATION SOFTWARE WAS USED IN THE CONSTRUCTION OF THIS NOTE.   Frederick Ponce HRC:[id number] DOB: [birth year] DOA: 07/21/2022  [facility]: [facility] *** Patient coming from: home ***  I have personally briefly reviewed patient's old medical records in [facility]  Chief Complaint: ***  HPI: Frederick Ponce is a 76 y.o. male with medical history significant for *** who is admitted to [facility] on 07/21/2022 with *** after presenting from home*** to [facility] ED complaining of ***.    ***    ***SOB: Denies any associated orthopnea, PND, or new onset peripheral edema. No recent chest pain, diaphoresis, palpitations, N/V, pre-syncope, or syncope. Not associated with any recent cough, wheezing, hemoptysis, new lower extremity erythema, or calf tenderness. Denies any recent trauma, travel, surgical procedures, or periods of prolonged diminished ambulatory status. No recent melena or hematochezia.   Denies any associated subjective fever, chills, rigors, or generalized myalgias. No recent headache, neck stiffness, rhinitis, rhinorrhea, sore throat, abdominal pain, diarrhea, or rash. No known recent COVID-19 exposures. Denies dysuria, gross hematuria, or change in urinary urgency/frequency.  ***   ***misc/infectious: Denies any subjective fever, chills, rigors, or generalized myalgias. Denies any recent headache, neck stiffness, rhinitis, rhinorrhea, sore throat, sob, wheezing, cough, nausea, vomiting, abdominal pain, diarrhea, or rash. No recent traveling or known COVID-19 exposures. Denies dysuria, gross hematuria, or change in urinary urgency/frequency.  Denies any recent chest pain, diaphoresis, or palpitations. ***    ED Course:  Vital signs in the ED were notable for the following: ***  Labs were notable for the following: ***  Imaging and additional notable ED work-up: ***  While in the ED, the following  were administered: ***  Subsequently, the patient was admitted  ***  ***red   Review of Systems: As per HPI otherwise 10 point review of systems negative.   Past Medical History:  Diagnosis Date   Hyperlipidemia    Parkinson disease ([place])     History reviewed. No pertinent surgical history.  Social History:  reports that he has never smoked. He has never used smokeless tobacco. He reports current alcohol use of about 14.0 standard drinks of alcohol per week. He reports that he does not use drugs.   Allergies  Allergen Reactions   Gabapentin Other (See Comments)    Restless legs   Celebrex [Celecoxib] Rash    Family History  Problem Relation Age of Onset   Tremor Father    Parkinson's disease Father    Melanoma Sister    Parkinson's disease Sister    Parkinson's disease Paternal Grandmother     Family history reviewed and not pertinent ***   Prior to Admission medications   Medication Sig Start Date End Date Taking? Authorizing Provider  abiraterone acetate (ZYTIGA) 250 MG tablet Take 4 tablets (1,000 mg total) by mouth daily. Take on an empty stomach 1 hour before or 2 hours after a meal 07/13/22   Wyatt Portela, MD  atorvastatin (LIPITOR) 40 MG tablet Take 1 tablet by mouth at bedtime.    [provider]  calcium-vitamin D (OSCAL WITH D) 500-5 MG-MCG tablet Take 1 tablet by mouth 2 (two) times daily. 03/13/22   Wyatt Portela, MD  carbidopa-levodopa (SINEMET) 25-100 MG tablet Take 1 tablet by mouth 3 (three) times daily. At 7 AM, 11AM, and 3PM. 06/11/22   Star Age, MD  co-enzyme Q-10 30 MG capsule Take 30 mg by mouth  3 (three) times daily.    [provider]  diphenhydrAMINE (BENADRYL) 25 MG tablet Take 1 tablet (25 mg total) by mouth every 6 (six) hours as needed. 01/20/22   Smoot, Leary Roca, PA-C  DULoxetine (CYMBALTA) 20 MG capsule Take 1 capsule (20 mg total) by mouth daily. 06/14/22   Pickenpack-Cousar, Carlena Sax, NP  EPINEPHrine 0.3 mg/0.3 mL  IJ SOAJ injection Inject 0.3 mg into the muscle as needed for anaphylaxis. 01/20/22   Smoot, Leary Roca, PA-C  ibuprofen (ADVIL) 400 MG tablet Take 1 tablet (400 mg total) by mouth every 8 (eight) hours as needed. 07/02/22   Pickenpack-Cousar, Carlena Sax, NP  KRILL OIL PO Take by mouth.    [provider]  lidocaine (LIDODERM) 5 % Place 1 patch onto the skin daily. Remove & Discard patch within 12 hours or as directed by MD 01/05/22   Pickenpack-Cousar, Carlena Sax, NP  morphine (MS CONTIN) 15 MG 12 hr tablet Take 1 tablet (15 mg total) by mouth every 12 (twelve) hours. 07/02/22   Pickenpack-Cousar, Carlena Sax, NP  morphine (MSIR) 15 MG tablet Take 1 tablet (15 mg total) by mouth every 4 (four) hours as needed for severe pain. 06/04/22   Pickenpack-Cousar, Carlena Sax, NP  multivitamin-lutein (OCUVITE-LUTEIN) CAPS capsule Take 1 capsule by mouth daily.    [provider]  predniSONE (DELTASONE) 5 MG tablet Take 1 tablet (5 mg total) by mouth daily with breakfast. 05/23/22   Wyatt Portela, MD  prochlorperazine (COMPAZINE) 10 MG tablet Take 1 tablet (10 mg total) by mouth every 6 (six) hours as needed for nausea or vomiting. 01/23/22   Wyatt Portela, MD  propranolol (INDERAL) 40 MG tablet Take 1 tablet by mouth daily.    [provider]  vitamin B-12 (CYANOCOBALAMIN) 1000 MCG tablet Take 1,000 mcg by mouth daily.    [provider]     Objective    Physical Exam: Vitals:   07/21/22 2145 07/21/22 2200 07/21/22 2240 07/21/22 2335  BP:  (!) 149/81 137/81 (!) 150/89  Pulse: (!) 102 (!) 108 (!) 108 (!) 112  Resp: (!) '25 17 19 15  '$ Temp:   97.8 F (36.6 C)   TempSrc:      SpO2: 98% 100% 100% 100%  Weight:      Height:        General: appears to be stated age; alert, oriented Skin: warm, dry, no rash Head:  AT/[place] Mouth:  Oral mucosa membranes appear moist, normal dentition Neck: supple; trachea midline Heart:  RRR; did not appreciate any M/R/G Lungs: CTAB, did not  appreciate any wheezes, rales, or rhonchi Abdomen: + BS; soft, ND, NT Vascular: 2+ pedal pulses b/l; 2+ radial pulses b/l Extremities: no peripheral edema, no muscle wasting Neuro: strength and sensation intact in upper and lower extremities b/l    *** Neuro: 5/5 strength of the proximal and distal flexors and extensors of the upper and lower extremities bilaterally; sensation intact in upper and lower extremities b/l; cranial nerves II through XII grossly intact; no pronator drift; no evidence suggestive of slurred speech, dysarthria, or facial droop; Normal muscle tone. No tremors. *** Neuro: In the setting of the patient's current mental status and associated inability to follow instructions, unable to perform full neurologic exam at this time.  As such, assessment of strength, sensation, and cranial nerves is limited at this time. Patient noted to spontaneously move all 4 extremities. No tremors.  ***    Labs on Admission:  I have personally reviewed following labs and imaging studies  CBC: Recent Labs  Lab 07/18/22 1101 07/21/22 2134  WBC 7.1 7.0  NEUTROABS 5.8 5.6  HGB 11.9* 13.5  HCT 35.9* 41.9  MCV 100.0 103.5*  PLT 179 242   Basic Metabolic Panel: Recent Labs  Lab 07/18/22 1101 07/21/22 2232  NA 138 143  K 4.2 4.4  CL 103 115*  CO2 29 22  GLUCOSE 116* 122*  BUN 13 14  CREATININE 0.76 0.67  CALCIUM 9.1 8.0*   GFR: Estimated Creatinine Clearance: 74.8 mL/min (by C-G formula based on SCr of 0.67 mg/dL). Liver Function Tests: Recent Labs  Lab 07/18/22 1101 07/21/22 2232  AST 14* 21  ALT 7 10  ALKPHOS 55 50  BILITOT 1.7* 1.2  PROT 6.7 6.5  ALBUMIN 4.3 3.8   No results for input(s): "LIPASE", "AMYLASE" in the last 168 hours. No results for input(s): "AMMONIA" in the last 168 hours. Coagulation Profile: No results for input(s): "INR", "PROTIME" in the last 168 hours. Cardiac Enzymes: No results for input(s): "CKTOTAL", "CKMB", "CKMBINDEX", "TROPONINI"  in the last 168 hours. BNP (last 3 results) No results for input(s): "PROBNP" in the last 8760 hours. HbA1C: No results for input(s): "HGBA1C" in the last 72 hours. CBG: Recent Labs  Lab 07/21/22 2141  GLUCAP 147*   Lipid Profile: No results for input(s): "CHOL", "HDL", "LDLCALC", "TRIG", "CHOLHDL", "LDLDIRECT" in the last 72 hours. Thyroid Function Tests: No results for input(s): "TSH", "T4TOTAL", "FREET4", "T3FREE", "THYROIDAB" in the last 72 hours. Anemia Panel: No results for input(s): "VITAMINB12", "FOLATE", "FERRITIN", "TIBC", "IRON", "RETICCTPCT" in the last 72 hours. Urine analysis:    Component Value Date/Time   COLORURINE YELLOW 01/20/2022 1106   APPEARANCEUR CLEAR 01/20/2022 1106   LABSPEC 1.027 01/20/2022 1106   PHURINE 5.0 01/20/2022 1106   GLUCOSEU NEGATIVE 01/20/2022 1106   HGBUR NEGATIVE 01/20/2022 1106   [place] 01/20/2022 1106   KETONESUR 5 (A) 01/20/2022 1106   PROTEINUR 30 (A) 01/20/2022 1106   NITRITE NEGATIVE 01/20/2022 1106   LEUKOCYTESUR SMALL (A) 01/20/2022 1106    Radiological Exams on Admission: CT Head Wo Contrast  Result Date: 07/21/2022 CLINICAL DATA:  Drug overdose with mental status changes. EXAM: CT HEAD WITHOUT CONTRAST TECHNIQUE: Contiguous axial images were obtained from the base of the skull through the vertex without intravenous contrast. RADIATION DOSE REDUCTION: This exam was performed according to the departmental dose-optimization program which includes automated exposure control, adjustment of the mA and/or kV according to patient size and/or use of iterative reconstruction technique. COMPARISON:  None Available. FINDINGS: Brain: There is mild cerebral atrophy with mild atrophic ventriculomegaly and moderate to severe small vessel disease of the cerebral white matter. There is slight cerebellar atrophy. No focal asymmetry is seen concerning for acute cortical based infarct, hemorrhage or mass. There is no midline shift. Basal  cisterns are clear. Vascular: Mild carotid atherosclerosis. No hyperdense central vessel is seen. Scattered calcification distal vertebral arteries. Skull: No fracture or focal lesion. Sinuses/Orbits: Unremarkable orbital contents. There is moderate membrane thickening in the left maxillary sinus. Other visible sinuses, bilateral mastoid air cells are clear. There is a reverse S shaped nasal septum with right-sided spurring. Other: None. IMPRESSION: 1. No acute intracranial CT findings. 2. Atrophy and small-vessel disease. 3. Sinus disease. Electronically Signed   By: Telford Nab M.D.   On: 07/21/2022 23:21   DG Chest Portable 1 View  Result Date: 07/21/2022 CLINICAL DATA:  Hypoxia and drug overdose EXAM: PORTABLE  CHEST 1 VIEW COMPARISON:  07/18/2022 FINDINGS: Cardiac shadow is enlarged but stable. Aortic calcifications are seen. The lungs are hypoinflated but clear. No acute bony abnormality is noted. Known sclerotic metastatic disease is not well appreciated on this exam. IMPRESSION: No acute abnormality noted. Electronically Signed   By: Inez Catalina M.D.   On: 07/21/2022 22:02     EKG: Independently reviewed, with result as described above. ***   Assessment/Plan    Principal Problem:   Acute respiratory failure with hypoxia (HCC)  ***      ***            ***       DVT prophylaxis: SCD's ***  Code Status: Full code*** Family Communication: none*** Disposition Plan: Per Rounding Team Consults called: none***;  Admission status: ***   PLEASE NOTE THAT DRAGON DICTATION SOFTWARE WAS USED IN THE CONSTRUCTION OF THIS NOTE.   Spencer DO [facility] [place]   07/22/2022, 12:01 AM   ***

## 2022-07-22 NOTE — Progress Notes (Signed)
Poison control called and given update on pts status. They recommend repeating EKG this morning. Hortencia Conradi RN

## 2022-07-22 NOTE — Progress Notes (Signed)
PROGRESS NOTE    Frederick Ponce  PZW:[id number] DOB: November 14, 1946 DOA: 07/21/2022 PCP: [facility]   Brief Narrative:  76 year old with history of metastatic prostate cancer, Parkinson's disease, HLD, GAD comes to the hospital with complaints of acute hypoxic respiratory distress and altered mental status.  Patient reportedly overdosed on tramadol, oxycodone and morphine.  Upon admission CT head, ammonia was unremarkable.  TSH slightly elevated.  CTA chest showed evidence of metastatic disease.  He was started on IV fluids, psychiatry team consulted.   Assessment & Plan:  Principal Problem:   Acute respiratory failure with hypoxia (HCC) Active Problems:   HLD (hyperlipidemia)   Parkinson's disease ([place])   Acute encephalopathy   Drug overdose, intentional, initial encounter ([place])   Acute prerenal azotemia   GAD (generalized anxiety disorder)   Acute hypoxic and hypercarbic respiratory failure Acute metabolic encephalopathy Drug overdose -Tylenol and salicylate levels are normal.  Ammonia levels normal.  TSH 5.0.CT of the head is negative, CTA chest does not show PE, showed some atelectasis and metastatic disease.    Narcan as needed.  UA and UDS pending -Narcan as needed  Dehydration -Continue IV fluids.  BNP normal, CK levels normal.  Macrocytosis - Hemoglobin stable  Hyperlipidemia - Statin  Parkinson's disease - Sinemet  Generalized anxiety disorder - Cymbalta  Acute urinary retention - Straight in and out cath.  If necessary will place Foley    DVT prophylaxis: SCDs Code Status: DNR Family Communication: Met with patient's partner at bedside, [facility] stay with IV fluids.  Once mentation has improved, he needs psychiatry evaluation.   Subjective: Met with patient's significant other at bedside who tells me that this was the first time patient attempted something like this where he overdosed on morphine, oxycodone and  tramadol. When I saw the patient he was arousable and able to carry on basic conversation.  Denied any complaints or any active suicidal thoughts.  His thoughts were still slightly foggy but overall his symptoms appear to be improving since morning and is much more arousable and independent since morning according to the sitter and staff members.    Examination:  Constitutional: Not in acute distress Respiratory: Clear to auscultation bilaterally Cardiovascular: Normal sinus rhythm, no rubs Abdomen: Nontender nondistended good bowel sounds Musculoskeletal: No edema noted Skin: No rashes seen Neurologic: CN 2-12 grossly intact.  And nonfocal Psychiatric: Poor judgment and insight.  Alert to name date and place. Some of his thoughts arent clear.   Objective: Vitals:   07/21/22 2240 07/21/22 2335 07/22/22 0058 07/22/22 0625  BP: 137/81 (!) 150/89 (!) 162/90 (!) 157/82  Pulse: (!) 108 (!) 112 (!) 103 (!) 105  Resp: 19 15 18 20   Temp: 97.8 F (36.6 C)  (!) 97.5 F (36.4 C) (!) 97.4 F (36.3 C)  TempSrc:   Oral Oral  SpO2: 100% 100% 99% 100%  Weight:    69.2 kg  Height:        Intake/Output Summary (Last 24 hours) at 07/22/2022 0906 Last data filed at 07/22/2022 4235 Gross per 24 hour  Intake 816.67 ml  Output --  Net 816.67 ml   Filed Weights   07/21/22 2124 07/22/22 0625  Weight: 76 kg 69.2 kg     Data Reviewed:   CBC: Recent Labs  Lab 07/18/22 1101 07/21/22 2134 07/22/22 0617  WBC 7.1 7.0 6.2  NEUTROABS 5.8 5.6 4.2  HGB 11.9* 13.5 12.9*  HCT 35.9* 41.9 40.7  MCV 100.0 103.5* 106.5*  PLT 179 205 010   Basic Metabolic Panel: Recent Labs  Lab 07/18/22 1101 07/21/22 2232 07/22/22 0617  NA 138 143 142  K 4.2 4.4 4.2  CL 103 115* 108  CO2 29 22 27   GLUCOSE 116* 122* 120*  BUN 13 14 13   CREATININE 0.76 0.67 0.63  CALCIUM 9.1 8.0* 8.4*  MG  --  2.0 2.6*  PHOS  --   --  4.1   GFR: Estimated Creatinine Clearance: 74.8 mL/min (by C-G formula based on SCr  of 0.63 mg/dL). Liver Function Tests: Recent Labs  Lab 07/18/22 1101 07/21/22 2232 07/22/22 0617  AST 14* 21 17  ALT 7 10 16   ALKPHOS 55 50 54  BILITOT 1.7* 1.2 0.9  PROT 6.7 6.5 6.8  ALBUMIN 4.3 3.8 4.3   No results for input(s): "LIPASE", "AMYLASE" in the last 168 hours. Recent Labs  Lab 07/22/22 0617  AMMONIA <10   Coagulation Profile: Recent Labs  Lab 07/22/22 0617  INR 1.0   Cardiac Enzymes: Recent Labs  Lab 07/22/22 0617  CKTOTAL 61   BNP (last 3 results) No results for input(s): "PROBNP" in the last 8760 hours. HbA1C: No results for input(s): "HGBA1C" in the last 72 hours. CBG: Recent Labs  Lab 07/21/22 2141  GLUCAP 147*   Lipid Profile: No results for input(s): "CHOL", "HDL", "LDLCALC", "TRIG", "CHOLHDL", "LDLDIRECT" in the last 72 hours. Thyroid Function Tests: Recent Labs    07/22/22 0617  TSH 5.061*   Anemia Panel: No results for input(s): "VITAMINB12", "FOLATE", "FERRITIN", "TIBC", "IRON", "RETICCTPCT" in the last 72 hours. Sepsis Labs: No results for input(s): "PROCALCITON", "LATICACIDVEN" in the last 168 hours.  Recent Results (from the past 240 hour(s))  Resp Panel by RT-PCR (Flu A&B, Covid) Anterior Nasal Swab     Status: None   Collection Time: 07/21/22 10:35 PM   Specimen: Anterior Nasal Swab  Result Value Ref Range Status   SARS Coronavirus 2 by RT PCR NEGATIVE NEGATIVE Final    Comment: (NOTE) SARS-CoV-2 target nucleic acids are NOT DETECTED.  The SARS-CoV-2 RNA is generally detectable in upper respiratory specimens during the acute phase of infection. The lowest concentration of SARS-CoV-2 viral copies this assay can detect is 138 copies/mL. A negative result does not preclude SARS-Cov-2 infection and should not be used as the sole basis for treatment or other patient management decisions. A negative result may occur with  improper specimen collection/handling, submission of specimen other than nasopharyngeal swab, presence  of viral mutation(s) within the areas targeted by this assay, and inadequate number of viral copies(<138 copies/mL). A negative result must be combined with clinical observations, patient history, and epidemiological information. The expected result is Negative.  Fact Sheet for Patients:  EntrepreneurPulse.com.au  Fact Sheet for Healthcare Providers:  IncredibleEmployment.be  This test is no t yet approved or cleared by the [place] FDA and  has been authorized for detection and/or diagnosis of SARS-CoV-2 by FDA under an Emergency Use Authorization (EUA). This EUA will remain  in effect (meaning this test can be used) for the duration of the COVID-19 declaration under Section 564(b)(1) of the Act, 21 U.S.C.section 360bbb-3(b)(1), unless the authorization is terminated  or revoked sooner.       Influenza A by PCR NEGATIVE NEGATIVE Final   Influenza B by PCR NEGATIVE NEGATIVE Final    Comment: (NOTE) The Xpert Xpress SARS-CoV-2/FLU/RSV plus assay is intended as an aid in the diagnosis of influenza from Nasopharyngeal swab specimens and should not be  used as a sole basis for treatment. Nasal washings and aspirates are unacceptable for Xpert Xpress SARS-CoV-2/FLU/RSV testing.  Fact Sheet for Patients: EntrepreneurPulse.com.au  Fact Sheet for Healthcare Providers: IncredibleEmployment.be  This test is not yet approved or cleared by the [place] FDA and has been authorized for detection and/or diagnosis of SARS-CoV-2 by FDA under an Emergency Use Authorization (EUA). This EUA will remain in effect (meaning this test can be used) for the duration of the COVID-19 declaration under Section 564(b)(1) of the Act, 21 U.S.C. section 360bbb-3(b)(1), unless the authorization is terminated or revoked.  Performed at [facility], [place] [address] [place], [place] [address]           Radiology Studies: CT Angio Chest PE W and/or Wo Contrast  Result Date: 07/22/2022 CLINICAL DATA:  Pulmonary embolus suspected with high probability. History of prostate cancer with lung metastasis. EXAM: CT ANGIOGRAPHY CHEST WITH CONTRAST TECHNIQUE: Multidetector CT imaging of the chest was performed using the standard protocol during bolus administration of intravenous contrast. Multiplanar CT image reconstructions and MIPs were obtained to evaluate the vascular anatomy. RADIATION DOSE REDUCTION: This exam was performed according to the departmental dose-optimization program which includes automated exposure control, adjustment of the mA and/or kV according to patient size and/or use of iterative reconstruction technique. CONTRAST:  159m OMNIPAQUE IOHEXOL 350 MG/ML SOLN COMPARISON:  07/18/2022 FINDINGS: Cardiovascular: Motion artifact limits the examination. There is good opacification of the central and segmental pulmonary arteries. No focal filling defects. No evidence of significant pulmonary embolus. Cardiac enlargement. No pericardial effusions. Normal caliber thoracic aorta. No aortic dissection. Aortic and coronary artery calcifications. Mediastinum/Nodes: Thyroid gland is unremarkable. No significant lymphadenopathy. Esophagus is decompressed. Lungs/Pleura: Motion artifact limits visualization of the lungs. Solid pulmonary nodule seen previously are mostly obscured today. Subpleural changes in the posterior lungs is likely dependent atelectasis. No pleural effusions. No pneumothorax. Airways are patent. Upper Abdomen: No acute abnormalities demonstrated. Musculoskeletal: Focal areas of sclerosis in the thoracic vertebra and in the posterior left seventh rib consistent with known metastatic disease. Review of the MIP images confirms the above findings. IMPRESSION: 1. No evidence of significant pulmonary embolus. 2. Dependent atelectasis in the lungs. Motion artifact limits visualization.  Known pulmonary nodules are not well demonstrated on today's study. 3. Vertebral and rib sclerosis consistent with known metastatic disease. No change. Electronically Signed   By: WLucienne CapersM.D.   On: 07/22/2022 00:21   CT Head Wo Contrast  Result Date: 07/21/2022 CLINICAL DATA:  Drug overdose with mental status changes. EXAM: CT HEAD WITHOUT CONTRAST TECHNIQUE: Contiguous axial images were obtained from the base of the skull through the vertex without intravenous contrast. RADIATION DOSE REDUCTION: This exam was performed according to the departmental dose-optimization program which includes automated exposure control, adjustment of the mA and/or kV according to patient size and/or use of iterative reconstruction technique. COMPARISON:  None Available. FINDINGS: Brain: There is mild cerebral atrophy with mild atrophic ventriculomegaly and moderate to severe small vessel disease of the cerebral white matter. There is slight cerebellar atrophy. No focal asymmetry is seen concerning for acute cortical based infarct, hemorrhage or mass. There is no midline shift. Basal cisterns are clear. Vascular: Mild carotid atherosclerosis. No hyperdense central vessel is seen. Scattered calcification distal vertebral arteries. Skull: No fracture or focal lesion. Sinuses/Orbits: Unremarkable orbital contents. There is moderate membrane thickening in the left maxillary sinus. Other visible sinuses, bilateral mastoid air cells are clear. There is a reverse S shaped nasal  septum with right-sided spurring. Other: None. IMPRESSION: 1. No acute intracranial CT findings. 2. Atrophy and small-vessel disease. 3. Sinus disease. Electronically Signed   By: Telford Nab M.D.   On: 07/21/2022 23:21   DG Chest Portable 1 View  Result Date: 07/21/2022 CLINICAL DATA:  Hypoxia and drug overdose EXAM: PORTABLE CHEST 1 VIEW COMPARISON:  07/18/2022 FINDINGS: Cardiac shadow is enlarged but stable. Aortic calcifications are seen. The  lungs are hypoinflated but clear. No acute bony abnormality is noted. Known sclerotic metastatic disease is not well appreciated on this exam. IMPRESSION: No acute abnormality noted. Electronically Signed   By: Inez Catalina M.D.   On: 07/21/2022 22:02        Scheduled Meds:  abiraterone acetate  1,000 mg Oral Daily   atorvastatin  40 mg Oral QHS   carbidopa-levodopa  1 tablet Oral 3 times per day   DULoxetine  20 mg Oral Daily   propranolol  40 mg Oral Daily   sodium chloride (PF)       Continuous Infusions:  sodium chloride 125 mL/hr at 07/22/22 0629     LOS: 0 days   Time spent= 35 mins    Jorge Retz Arsenio Loader, MD [facility]  If 7PM-7AM, please contact night-coverage  07/22/2022, 9:06 AM

## 2022-07-23 ENCOUNTER — Telehealth: Payer: Self-pay | Admitting: *Deleted

## 2022-07-23 DIAGNOSIS — J9601 Acute respiratory failure with hypoxia: Secondary | ICD-10-CM | POA: Diagnosis not present

## 2022-07-23 LAB — CBC
HCT: 35.2 % — ABNORMAL LOW (ref 39.0–52.0)
Hemoglobin: 11.2 g/dL — ABNORMAL LOW (ref 13.0–17.0)
MCH: 33.4 pg (ref 26.0–34.0)
MCHC: 31.8 g/dL (ref 30.0–36.0)
MCV: 105.1 fL — ABNORMAL HIGH (ref 80.0–100.0)
Platelets: 175 10*3/uL (ref 150–400)
RBC: 3.35 MIL/uL — ABNORMAL LOW (ref 4.22–5.81)
RDW: 13.3 % (ref 11.5–15.5)
WBC: 7.9 10*3/uL (ref 4.0–10.5)
nRBC: 0 % (ref 0.0–0.2)

## 2022-07-23 LAB — BASIC METABOLIC PANEL
Anion gap: 8 (ref 5–15)
BUN: 8 mg/dL (ref 8–23)
CO2: 24 mmol/L (ref 22–32)
Calcium: 7.5 mg/dL — ABNORMAL LOW (ref 8.9–10.3)
Chloride: 105 mmol/L (ref 98–111)
Creatinine, Ser: 0.51 mg/dL — ABNORMAL LOW (ref 0.61–1.24)
GFR, Estimated: >60 mL/min (ref >60)
Glucose, Bld: 105 mg/dL — ABNORMAL HIGH (ref 70–99)
Potassium: 3.4 mmol/L — ABNORMAL LOW (ref 3.5–5.1)
Sodium: 137 mmol/L (ref 135–145)

## 2022-07-23 LAB — MAGNESIUM: Magnesium: 1.9 mg/dL (ref 1.7–2.4)

## 2022-07-23 MED ORDER — CHLORHEXIDINE GLUCONATE CLOTH 2 % EX PADS
6.0000 | MEDICATED_PAD | Freq: Every day | CUTANEOUS | Status: DC
  Administered 2022-07-23 – 2022-07-25 (×3): 6 via TOPICAL

## 2022-07-23 MED ORDER — POTASSIUM CHLORIDE CRYS ER 20 MEQ PO TBCR
40.0000 meq | EXTENDED_RELEASE_TABLET | Freq: Once | ORAL | Status: AC
  Administered 2022-07-23: 40 meq via ORAL
  Filled 2022-07-23: qty 2

## 2022-07-23 MED ORDER — SODIUM CHLORIDE 0.9 % IV SOLN
INTRAVENOUS | Status: AC
Start: 2022-07-23 — End: 2022-07-24

## 2022-07-23 NOTE — Telephone Encounter (Signed)
Patient's husband, [name], came to [facility] to inform Dr Alen Blew that patient attempted suicide Saturday due to inability to cope with his pain.  Patient is admitted & will be transferred to [facility] once a bed is available.  Emotional support offered to patient's husband, informed him [facility] will help in any way possible.  Dr. Alen Blew & N. Pickenpack-Cousar NP notified.

## 2022-07-23 NOTE — TOC Initial Note (Addendum)
Transition of Care [facility]) - Initial/Assessment Note    Patient Details  Name: Frederick Ponce MRN: [id number] Date of Birth: 05/28/1946  [facility] CM/SW Contact:    Ross Ludwig, LCSW Phone Number: 07/23/2022, 10:04 AM  Clinical Narrative:                  Patient is a 76 year old male who is alert and oriented x4.  Patient admitted for suicide attempt, psych saw him and are recommending inpatient psych placement.  CSW contacted [place] she requested to send information via secure chat so she can review.  Per physician patient is medically clear and willing to go to [facility] voluntarily.  CSW awaiting response back from [facility].  12:45pm  [facility] responded back to CSW.  She is not able to accept patient, referring to TTS for geripsych at [facility].  Waiting for decision from them if they can accept patient.  1255 No beds available on Geripsych today, waiting for them to review, possibly bed available tomorrow.    Expected Discharge Plan: Psychiatric Hospital Barriers to Discharge: Other (must enter comment) (Waiting for [facility] to review.)   Patient Goals and CMS Choice Patient states their goals for this hospitalization and ongoing recovery are:: To go to inpatient psych and then return back home. CMS Medicare.gov Compare Post Acute Care list provided to:: Patient Choice offered to / list presented to : Patient  Expected Discharge Plan and Services Expected Discharge Plan: [facility]                                              Prior Living Arrangements/Services   Lives with:: Spouse Patient language and need for interpreter reviewed:: Yes Do you feel safe going back to the place where you live?: No   Patient needs to go to inpatient psych before returning back home.  Need for Family Participation in Patient Care: No (Comment) Care giver support system in place?: No (comment)   Criminal Activity/Legal Involvement Pertinent to Current  Situation/Hospitalization: No - Comment as needed  Activities of Daily Living      Permission Sought/Granted Permission sought to share information with : Case Manager, Psychiatrist, Family Supports, Customer service manager Permission granted to share information with : Yes, Verbal Permission Granted  Share Information with NAME: jackson,david Spouse   [phone]           Emotional Assessment Appearance:: Appears stated age   Affect (typically observed): Accepting, Appropriate, Stable Orientation: : Oriented to Self, Oriented to  Time, Oriented to Place, Oriented to Situation Alcohol / Substance Use: Alcohol Use Psych Involvement: Yes (comment)  Admission diagnosis:  Acute respiratory failure with hypoxia ([place]) [J96.01] Narcotic overdose, intentional self-harm, initial encounter ([place]) [T40.602A] Overdose [T50.901A] Patient Active Problem List   Diagnosis Date Noted   Acute encephalopathy 07/22/2022   Drug overdose, intentional, initial encounter ([place]) 07/22/2022   Acute prerenal azotemia 07/22/2022   GAD (generalized anxiety disorder) 07/22/2022   Overdose 07/22/2022   Acute respiratory failure with hypoxia ([place]) 07/21/2022   Basal cell carcinoma (BCC) of left shoulder 06/07/2022   Overweight 01/24/2022   HLD (hyperlipidemia) 01/24/2022   Parkinson's disease ([place]) 01/24/2022   Prostate 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 ([place]) 01/18/2022   Iliac bone pain 12/27/2021   Dupuytren's disease 07/01/2017   PCP:  [facility] [place]:  [facility] [id number], [place] [address] Phone: [phone] Fax: [phone]  [place] [address] Phone: [phone] Fax: [phone]  (Inactive see NCPDP [id number]) [place], [place] [address] Phone: [phone]  Fax: [place], [place] [address] [place] [address] [place] [address] Phone: [phone] Fax: [phone]     Social Determinants of Health (SDOH) Interventions    Readmission Risk Interventions     No data to display

## 2022-07-23 NOTE — Discharge Summary (Signed)
Physician Discharge Summary  Everado Pillsbury LZJ:[id number] DOB: 09-23-1946 DOA: 07/21/2022  PCP: [facility]  Admit date: 07/21/2022 Discharge date: 07/26/2022  Admitted From: HOME Disposition: Home.  Initially inpatient psych was recommended but while waiting patient's condition significantly improved therefore I will be going home with outpatient psychiatry follow-up.  Recommendations for Outpatient Follow-up:  Follow up with PCP in 1-2 weeks Please obtain BMP/CBC in one week your next doctors visit.  Outpatient follow-up with psychiatry has been arranged, details below Outpatient neurology follow-up.    Discharge Condition: Stable CODE STATUS: Full code Diet recommendation: Regular  Brief/Interim Summary: 76 year old with history of metastatic prostate cancer, Parkinson's disease, HLD, GAD comes to the hospital with complaints of acute hypoxic respiratory distress and altered mental status.  Patient reportedly overdosed on tramadol, oxycodone and morphine.  Upon admission CT head, ammonia was unremarkable.  TSH slightly elevated.  CTA chest showed evidence of metastatic disease.  He was started on IV fluids, psychiatry team consulted.  Due to acute urinary retention, Foley catheter was placed.  His metabolic work-up was negative, he was given aggressive IV fluids and as narcotics wore off his mentation slowly improved.  Psychiatry recommended inpatient psychiatry admission. Initially inpatient psychiatry bed was recommended but while awaiting his placement his mentation improved.  He was seen by psychiatry and recommended outpatient follow-up.  Arrangements was made by their service.  Foley catheter removed prior to discharge.  He has been advised to follow-up outpatient as recommended above.  Patient initially medically cleared on 7/31 for discharge to inpatient psychiatry but while awaiting bed, he is mentation improved and he was reevaluated by psychiatry.  Medically  cleared for discharge on 8/3 to go home.     Assessment & Plan:  Principal Problem:   Acute respiratory failure with hypoxia (HCC) Active Problems:   HLD (hyperlipidemia)   Parkinson's disease ([place])   Acute encephalopathy   Drug overdose, intentional, initial encounter ([place])   Acute prerenal azotemia   GAD (generalized anxiety disorder)   Acute hypoxic and hypercarbic respiratory failure Acute metabolic encephalopathy Drug overdose -Tylenol and salicylate levels are normal.  Ammonia levels normal.  TSH 5.0.CT of the head is negative, CTA chest does not show PE, showed some atelectasis and metastatic disease.    Narcan as needed.  Patient's mentation is back to baseline today.  Family is also at baseline.  Initially recommended inpatient psych now medically discharge. -Narcan as needed  Metastatic prostate cancer - Continue outpatient Zytiga and daily prednisone.  Follow-up outpatient with Dr. Alen Blew and urology   Dehydration - Improved with fluids.   Macrocytosis - Hemoglobin stable   Hyperlipidemia - Statin   Parkinson's disease - Sinemet   Generalized anxiety disorder - Cymbalta   Acute urinary retention -Foley catheter will be removed today.  Outpatient follow-up with urology, referral given.  Essential hypertension - Norvasc 5 mg orally daily started.  Resume home medications.      Assessment and Plan: No notes have been filed under this hospital service. Service: Hospitalist      Body mass index is 26.15 kg/m.       Discharge Diagnoses:  Principal Problem:   Acute respiratory failure with hypoxia (HCC) Active Problems:   HLD (hyperlipidemia)   Parkinson's disease ([place])   Acute encephalopathy   Drug overdose, intentional, initial encounter ([place])   Acute prerenal azotemia   GAD (generalized anxiety disorder)   Overdose      Consultations: Psychiatry  Subjective: Sitting up feels well  no complaints.  He realizes that he overdosed on  medication in order to take away his pain.  Denies any previous suicidal ideation.  Agreeable to go to inpatient psychiatry for help.  This morning he denies any suicidal or homicidal ideations.  Addendum 8/3: Patient doing much better today, no suicidal or homicidal ideation.  I spoke with patient's significant other [name] as well.  All the questions have been answered by me.  Discharge Exam: Vitals:   07/25/22 1330 07/25/22 1946  BP: (!) 165/77 (!) 155/84  Pulse: 61 67  Resp: 16 18  Temp: 98.1 F (36.7 C) 97.7 F (36.5 C)  SpO2: 97% 97%   Vitals:   07/25/22 0633 07/25/22 0633 07/25/22 1330 07/25/22 1946  BP: (!) 170/81 (!) 170/81 (!) 165/77 (!) 155/84  Pulse: 86 85 61 67  Resp: '16 16 16 18  '$ Temp: 97.7 F (36.5 C) 97.7 F (36.5 C) 98.1 F (36.7 C) 97.7 F (36.5 C)  TempSrc: Oral Oral Oral Oral  SpO2: 95% 95% 97% 97%  Weight:      Height:        General: Pt is alert, awake, not in acute distress Cardiovascular: RRR, S1/S2 +, no rubs, no gallops Respiratory: CTA bilaterally, no wheezing, no rhonchi Abdominal: Soft, NT, ND, bowel sounds + Extremities: no edema, no cyanosis  Discharge Instructions  Discharge Instructions     Ambulatory referral to Urology   Complete by: As directed    Prostate cancer and urinary retention.      Allergies as of 07/26/2022       Reactions   Gabapentin Other (See Comments)   Restless legs/confusion   Celebrex [celecoxib] Rash        Medication List     STOP taking these medications    diphenhydrAMINE 25 MG tablet Commonly known as: BENADRYL   EPINEPHrine 0.3 mg/0.3 mL Soaj injection Commonly known as: EPI-PEN   lidocaine 5 % Commonly known as: LIDODERM   prochlorperazine 10 MG tablet Commonly known as: COMPAZINE   propranolol 40 MG tablet Commonly known as: INDERAL       TAKE these medications    abiraterone acetate 250 MG tablet Commonly known as: ZYTIGA Take 4 tablets (1,000 mg total) by mouth daily. Take  on an empty stomach 1 hour before or 2 hours after a meal What changed:  when to take this additional instructions   amLODipine 5 MG tablet Commonly known as: Norvasc Take 1 tablet (5 mg total) by mouth daily.   atorvastatin 40 MG tablet Commonly known as: LIPITOR Take 40 mg by mouth every morning.   calcium-vitamin D 500-5 MG-MCG tablet Commonly known as: OSCAL WITH D Take 1 tablet by mouth 2 (two) times daily.   carbidopa-levodopa 25-100 MG tablet Commonly known as: Sinemet Take 1 tablet by mouth 3 (three) times daily. At 7 AM, 11AM, and 3PM. What changed: additional instructions   COENZYME Q10 PO Take 1 capsule by mouth every morning.   cyanocobalamin 1000 MCG tablet Commonly known as: VITAMIN B12 Take 1,000 mcg by mouth every morning.   DULoxetine 20 MG capsule Commonly known as: CYMBALTA Take 1 capsule (20 mg total) by mouth daily. What changed: when to take this   ibuprofen 600 MG tablet Commonly known as: ADVIL Take 600 mg by mouth every 6 (six) hours as needed (pain). What changed: Another medication with the same name was removed. Continue taking this medication, and follow the directions you see here.   KRILL OIL  PO Take 1 capsule by mouth every morning.   morphine 15 MG tablet Commonly known as: MSIR Take 1 tablet (15 mg total) by mouth every 4 (four) hours as needed for severe pain.   morphine 15 MG 12 hr tablet Commonly known as: MS CONTIN Take 1 tablet (15 mg total) by mouth every 12 (twelve) hours.   multivitamin-lutein Caps capsule Take 1 capsule by mouth every morning.   oxyCODONE 5 MG immediate release tablet Commonly known as: Oxy IR/ROXICODONE Take 5 mg by mouth every 6 (six) hours as needed for severe pain or breakthrough pain.   predniSONE 5 MG tablet Commonly known as: DELTASONE Take 5 mg by mouth daily with breakfast.   traMADol 50 MG tablet Commonly known as: ULTRAM Take 50 mg by mouth every 6 (six) hours as needed (pain).         [place] Follow up in 1 week(s).   Contact information: [place] [address] [place], [place] Follow up in 1 week(s).   Contact information: [address] [place] [address] [phone]         BEHAVIORAL HEALTH PARTIAL HOSPITALIZATION PROGRAM Follow up in 5 day(s).   Specialty: Behavioral Health Why: You are scheduled for an assessment for the Partial Hospitalization Program on 07/31/22 at 10:00am. This appointment will last approximately one hour and will be virtual via Webex. You will receive an email invite the morning of the appt. PHP is virtual group therapy that runs Mon-Fri from 9am-1pm. Please download the [employer] app prior to the appointment. If you need to cancel or reschedule, please call [phone]. Contact information: [place] [phone]               Allergies  Allergen Reactions   Gabapentin Other (See Comments)    Restless legs/confusion   Celebrex [Celecoxib] Rash    You were cared for by a hospitalist during your hospital stay. If you have any questions about your discharge medications or the care you received while you were in the hospital after you are discharged, you can call the unit and asked to speak with the hospitalist on call if the hospitalist that took care of you is not available. Once you are discharged, your primary care physician will handle any further medical issues. Please note that no refills for any discharge medications will be authorized once you are discharged, as it is imperative that you return to your primary care physician (or establish a relationship with a primary care physician if you do not have one) for your aftercare needs so that they can reassess your need for medications and monitor your lab values.   Procedures/Studies: CT Angio Chest PE W and/or Wo  Contrast  Result Date: 07/22/2022 CLINICAL DATA:  Pulmonary embolus suspected with high probability. History of prostate cancer with lung metastasis. EXAM: CT ANGIOGRAPHY CHEST WITH CONTRAST TECHNIQUE: Multidetector CT imaging of the chest was performed using the standard protocol during bolus administration of intravenous contrast. Multiplanar CT image reconstructions and MIPs were obtained to evaluate the vascular anatomy. RADIATION DOSE REDUCTION: This exam was performed according to the departmental dose-optimization program which includes automated exposure control, adjustment of the mA and/or kV according to patient size and/or use of iterative reconstruction technique. CONTRAST:  184m OMNIPAQUE IOHEXOL 350 MG/ML SOLN COMPARISON:  07/18/2022 FINDINGS: Cardiovascular: Motion artifact limits  the examination. There is good opacification of the central and segmental pulmonary arteries. No focal filling defects. No evidence of significant pulmonary embolus. Cardiac enlargement. No pericardial effusions. Normal caliber thoracic aorta. No aortic dissection. Aortic and coronary artery calcifications. Mediastinum/Nodes: Thyroid gland is unremarkable. No significant lymphadenopathy. Esophagus is decompressed. Lungs/Pleura: Motion artifact limits visualization of the lungs. Solid pulmonary nodule seen previously are mostly obscured today. Subpleural changes in the posterior lungs is likely dependent atelectasis. No pleural effusions. No pneumothorax. Airways are patent. Upper Abdomen: No acute abnormalities demonstrated. Musculoskeletal: Focal areas of sclerosis in the thoracic vertebra and in the posterior left seventh rib consistent with known metastatic disease. Review of the MIP images confirms the above findings. IMPRESSION: 1. No evidence of significant pulmonary embolus. 2. Dependent atelectasis in the lungs. Motion artifact limits visualization. Known pulmonary nodules are not well demonstrated on today's  study. 3. Vertebral and rib sclerosis consistent with known metastatic disease. No change. Electronically Signed   By: Lucienne Capers M.D.   On: 07/22/2022 00:21   CT Head Wo Contrast  Result Date: 07/21/2022 CLINICAL DATA:  Drug overdose with mental status changes. EXAM: CT HEAD WITHOUT CONTRAST TECHNIQUE: Contiguous axial images were obtained from the base of the skull through the vertex without intravenous contrast. RADIATION DOSE REDUCTION: This exam was performed according to the departmental dose-optimization program which includes automated exposure control, adjustment of the mA and/or kV according to patient size and/or use of iterative reconstruction technique. COMPARISON:  None Available. FINDINGS: Brain: There is mild cerebral atrophy with mild atrophic ventriculomegaly and moderate to severe small vessel disease of the cerebral white matter. There is slight cerebellar atrophy. No focal asymmetry is seen concerning for acute cortical based infarct, hemorrhage or mass. There is no midline shift. Basal cisterns are clear. Vascular: Mild carotid atherosclerosis. No hyperdense central vessel is seen. Scattered calcification distal vertebral arteries. Skull: No fracture or focal lesion. Sinuses/Orbits: Unremarkable orbital contents. There is moderate membrane thickening in the left maxillary sinus. Other visible sinuses, bilateral mastoid air cells are clear. There is a reverse S shaped nasal septum with right-sided spurring. Other: None. IMPRESSION: 1. No acute intracranial CT findings. 2. Atrophy and small-vessel disease. 3. Sinus disease. Electronically Signed   By: Telford Nab M.D.   On: 07/21/2022 23:21   DG Chest Portable 1 View  Result Date: 07/21/2022 CLINICAL DATA:  Hypoxia and drug overdose EXAM: PORTABLE CHEST 1 VIEW COMPARISON:  07/18/2022 FINDINGS: Cardiac shadow is enlarged but stable. Aortic calcifications are seen. The lungs are hypoinflated but clear. No acute bony abnormality is  noted. Known sclerotic metastatic disease is not well appreciated on this exam. IMPRESSION: No acute abnormality noted. Electronically Signed   By: Inez Catalina M.D.   On: 07/21/2022 22:02   CT CHEST ABDOMEN PELVIS W CONTRAST  Result Date: 07/18/2022 CLINICAL DATA:  Prostate cancer, assess treatment response; * Tracking Code: BO * EXAM: CT CHEST, ABDOMEN, AND PELVIS WITH CONTRAST TECHNIQUE: Multidetector CT imaging of the chest, abdomen and pelvis was performed following the standard protocol during bolus administration of intravenous contrast. RADIATION DOSE REDUCTION: This exam was performed according to the departmental dose-optimization program which includes automated exposure control, adjustment of the mA and/or kV according to patient size and/or use of iterative reconstruction technique. CONTRAST:  111m OMNIPAQUE IOHEXOL 300 MG/ML  SOLN COMPARISON:  CT chest, abdomen and pelvis dated January 05, 2022 FINDINGS: CT CHEST FINDINGS Cardiovascular: Normal heart size. No pericardial effusion. Atherosclerotic disease of the thoracic aorta.  No suspicious filling defects of the main pulmonary arteries. Moderate coronary artery calcifications of the LAD. Mediastinum/Nodes: Esophagus and thyroid are unremarkable. Interval interval decreased size of right hilar lymph node measuring 0.6 cm, previously 1.2 cm. No pathologically enlarged lymph nodes seen in the chest. Lungs/Pleura: Central airways are patent. No consolidation, pleural effusion or pneumothorax. Bilateral solid pulmonary nodules are markedly decreased in size or no longer present. Reference solid pulmonary nodule of the left lower lobe measuring 0.5 cm on series 4 image 97, previously measured up to 1.8 cm. Reference nodule of the right upper lobe measuring 3 mm on series 4, image 45, previously measured 10 mm. Musculoskeletal: Increased sclerosis of the left posterior 7th rib. CT ABDOMEN PELVIS FINDINGS Hepatobiliary: Peripherally enhancing lesion of  the right hepatic dome measuring 1.8 cm on series 2, image 50, similar to prior exam and likely a hemangioma. No suspicious liver lesions. Gallbladder is unremarkable. No biliary ductal dilation. Pancreas: Unremarkable. No pancreatic ductal dilatation or surrounding inflammatory changes. Spleen: Normal in size without focal abnormality. Adrenals/Urinary Tract: Bilateral adrenal glands are unremarkable. 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 Unchanged bilateral renal stones. No left hydronephrosis. No suspicious renal lesions. Stomach/Bowel: Stomach is within normal limits. Appendix appears normal. No evidence of bowel wall thickening, distention, or inflammatory changes. Vascular/Lymphatic: Aortic atherosclerosis. No enlarged abdominal or pelvic lymph nodes. Reproductive: Prostate is decreased in size. Other: Moderate size right inguinal hernia containing a nondilated loop of sigmoid colon. No abdominopelvic ascites. Musculoskeletal: Sclerosis of the left iliac bone and sacrum, increased when compared with prior exam. IMPRESSION: 1. Bilateral solid pulmonary nodules are markedly decreased in size or no longer present. 2. Decreased right hilar adenopathy. 3. Prostate is decreased in size. 4. Increased sclerosis of the posterior left seventh rib, left iliac bone and sacrum, likely due to treatment response. Electronically Signed   By: Yetta Glassman M.D.   On: 07/18/2022 19:39     The results of significant diagnostics from this hospitalization (including imaging, microbiology, ancillary and laboratory) are listed below for reference.     Microbiology: Recent Results (from the past 240 hour(s))  Resp Panel by RT-PCR (Flu A&B, Covid) Anterior Nasal Swab     Status: None   Collection Time: 07/21/22 10:35 PM   Specimen: Anterior Nasal Swab  Result Value Ref Range Status   SARS Coronavirus 2 by RT PCR NEGATIVE NEGATIVE  Final    Comment: (NOTE) SARS-CoV-2 target nucleic acids are NOT DETECTED.  The SARS-CoV-2 RNA is generally detectable in upper respiratory specimens during the acute phase of infection. The lowest concentration of SARS-CoV-2 viral copies this assay can detect is 138 copies/mL. A negative result does not preclude SARS-Cov-2 infection and should not be used as the sole basis for treatment or other patient management decisions. A negative result may occur with  improper specimen collection/handling, submission of specimen other than nasopharyngeal swab, presence of viral mutation(s) within the areas targeted by this assay, and inadequate number of viral copies(<138 copies/mL). A negative result must be combined with clinical observations, patient history, and epidemiological information. The expected result is Negative.  Fact Sheet for Patients:  EntrepreneurPulse.com.au  Fact Sheet for Healthcare Providers:  IncredibleEmployment.be  This test is no t yet approved or cleared by the [place] FDA and  has been authorized for detection and/or diagnosis of SARS-CoV-2 by FDA under an Emergency Use Authorization (EUA). This EUA will remain  in effect (meaning  this test can be used) for the duration of the COVID-19 declaration under Section 564(b)(1) of the Act, 21 U.S.C.section 360bbb-3(b)(1), unless the authorization is terminated  or revoked sooner.       Influenza A by PCR NEGATIVE NEGATIVE Final   Influenza B by PCR NEGATIVE NEGATIVE Final    Comment: (NOTE) The Xpert Xpress SARS-CoV-2/FLU/RSV plus assay is intended as an aid in the diagnosis of influenza from Nasopharyngeal swab specimens and should not be used as a sole basis for treatment. Nasal washings and aspirates are unacceptable for Xpert Xpress SARS-CoV-2/FLU/RSV testing.  Fact Sheet for Patients: EntrepreneurPulse.com.au  Fact Sheet for Healthcare  Providers: IncredibleEmployment.be  This test is not yet approved or cleared by the [place] FDA and has been authorized for detection and/or diagnosis of SARS-CoV-2 by FDA under an Emergency Use Authorization (EUA). This EUA will remain in effect (meaning this test can be used) for the duration of the COVID-19 declaration under Section 564(b)(1) of the Act, 21 U.S.C. section 360bbb-3(b)(1), unless the authorization is terminated or revoked.  Performed at [facility], [place] [address], [place], [place] [address]      Labs: BNP (last 3 results) Recent Labs    07/22/22 0617  BNP 92.3   Basic Metabolic Panel: Recent Labs  Lab 07/22/22 0617 07/23/22 0513 07/24/22 0442 07/25/22 0501 07/26/22 0534  NA 142 137 138 139 139  K 4.2 3.4* 3.4* 3.5 3.2*  CL 108 105 105 108 107  CO2 '27 24 25 23 '$ 21*  GLUCOSE 120* 105* 91 77 101*  BUN 13 8 5* 8 12  CREATININE 0.63 0.51* 0.57* 0.58* 0.69  CALCIUM 8.4* 7.5* 7.6* 7.9* 8.1*  MG 2.6* 1.9 2.2 2.4 2.5*  PHOS 4.1  --   --   --   --    Liver Function Tests: Recent Labs  Lab 07/21/22 2232 07/22/22 0617  AST 21 17  ALT 10 16  ALKPHOS 50 54  BILITOT 1.2 0.9  PROT 6.5 6.8  ALBUMIN 3.8 4.3   No results for input(s): "LIPASE", "AMYLASE" in the last 168 hours. Recent Labs  Lab 07/22/22 0617  AMMONIA <10   CBC: Recent Labs  Lab 07/21/22 2134 07/22/22 0617 07/23/22 0513 07/24/22 0442 07/25/22 0501 07/26/22 0534  WBC 7.0 6.2 7.9 4.5 5.5 6.5  NEUTROABS 5.6 4.2  --   --   --   --   HGB 13.5 12.9* 11.2* 11.8* 12.2* 13.2  HCT 41.9 40.7 35.2* 35.6* 36.5* 39.3  MCV 103.5* 106.5* 105.1* 100.6* 99.7 99.7  PLT 205 209 175 175 206 247   Cardiac Enzymes: Recent Labs  Lab 07/22/22 0617  CKTOTAL 61   BNP: Invalid input(s): "POCBNP" CBG: Recent Labs  Lab 07/21/22 2141  GLUCAP 147*   D-Dimer No results for input(s): "DDIMER" in the last 72 hours. Hgb A1c No results for input(s):  "HGBA1C" in the last 72 hours. Lipid Profile No results for input(s): "CHOL", "HDL", "LDLCALC", "TRIG", "CHOLHDL", "LDLDIRECT" in the last 72 hours. Thyroid function studies No results for input(s): "TSH", "T4TOTAL", "T3FREE", "THYROIDAB" in the last 72 hours.  Invalid input(s): "FREET3"  Anemia work up No results for input(s): "VITAMINB12", "FOLATE", "FERRITIN", "TIBC", "IRON", "RETICCTPCT" in the last 72 hours. Urinalysis    Component Value Date/Time   COLORURINE YELLOW 07/21/2022 1225   APPEARANCEUR CLEAR 07/21/2022 1225   LABSPEC 1.033 (H) 07/21/2022 1225   PHURINE 5.0 07/21/2022 [place] 07/21/2022 [place] 07/21/2022  [place] 07/21/2022 [place] 07/21/2022 [place] 07/21/2022 1225   NITRITE NEGATIVE 07/21/2022 [place] 07/21/2022 1225   Sepsis Labs Recent Labs  Lab 07/23/22 0513 07/24/22 0442 07/25/22 0501 07/26/22 0534  WBC 7.9 4.5 5.5 6.5   Microbiology Recent Results (from the past 240 hour(s))  Resp Panel by RT-PCR (Flu A&B, Covid) Anterior Nasal Swab     Status: None   Collection Time: 07/21/22 10:35 PM   Specimen: Anterior Nasal Swab  Result Value Ref Range Status   SARS Coronavirus 2 by RT PCR NEGATIVE NEGATIVE Final    Comment: (NOTE) SARS-CoV-2 target nucleic acids are NOT DETECTED.  The SARS-CoV-2 RNA is generally detectable in upper respiratory specimens during the acute phase of infection. The lowest concentration of SARS-CoV-2 viral copies this assay can detect is 138 copies/mL. A negative result does not preclude SARS-Cov-2 infection and should not be used as the sole basis for treatment or other patient management decisions. A negative result may occur with  improper specimen collection/handling, submission of specimen other than nasopharyngeal swab, presence of viral mutation(s) within the areas targeted by this assay, and inadequate number of  viral copies(<138 copies/mL). A negative result must be combined with clinical observations, patient history, and epidemiological information. The expected result is Negative.  Fact Sheet for Patients:  EntrepreneurPulse.com.au  Fact Sheet for Healthcare Providers:  IncredibleEmployment.be  This test is no t yet approved or cleared by the [place] FDA and  has been authorized for detection and/or diagnosis of SARS-CoV-2 by FDA under an Emergency Use Authorization (EUA). This EUA will remain  in effect (meaning this test can be used) for the duration of the COVID-19 declaration under Section 564(b)(1) of the Act, 21 U.S.C.section 360bbb-3(b)(1), unless the authorization is terminated  or revoked sooner.       Influenza A by PCR NEGATIVE NEGATIVE Final   Influenza B by PCR NEGATIVE NEGATIVE Final    Comment: (NOTE) The Xpert Xpress SARS-CoV-2/FLU/RSV plus assay is intended as an aid in the diagnosis of influenza from Nasopharyngeal swab specimens and should not be used as a sole basis for treatment. Nasal washings and aspirates are unacceptable for Xpert Xpress SARS-CoV-2/FLU/RSV testing.  Fact Sheet for Patients: EntrepreneurPulse.com.au  Fact Sheet for Healthcare Providers: IncredibleEmployment.be  This test is not yet approved or cleared by the [place] FDA and has been authorized for detection and/or diagnosis of SARS-CoV-2 by FDA under an Emergency Use Authorization (EUA). This EUA will remain in effect (meaning this test can be used) for the duration of the COVID-19 declaration under Section 564(b)(1) of the Act, 21 U.S.C. section 360bbb-3(b)(1), unless the authorization is terminated or revoked.  Performed at [facility], [place] [address] [place], [place] [address]      Time coordinating discharge:  I have spent 35 minutes face to face with the patient and on the  ward discussing the patients care, assessment, plan and disposition with other care givers. >50% of the time was devoted counseling the patient about the risks and benefits of treatment/Discharge disposition and coordinating care.   SIGNED:   Damita Lack, MD  [facility] 07/26/2022, 1:01 PM   If 7PM-7AM, please contact night-coverage

## 2022-07-24 ENCOUNTER — Inpatient Hospital Stay: Payer: Medicare Other | Admitting: Oncology

## 2022-07-24 ENCOUNTER — Inpatient Hospital Stay: Payer: Medicare Other

## 2022-07-24 LAB — BASIC METABOLIC PANEL
Anion gap: 8 (ref 5–15)
BUN: 5 mg/dL — ABNORMAL LOW (ref 8–23)
CO2: 25 mmol/L (ref 22–32)
Calcium: 7.6 mg/dL — ABNORMAL LOW (ref 8.9–10.3)
Chloride: 105 mmol/L (ref 98–111)
Creatinine, Ser: 0.57 mg/dL — ABNORMAL LOW (ref 0.61–1.24)
GFR, Estimated: >60 mL/min (ref >60)
Glucose, Bld: 91 mg/dL (ref 70–99)
Potassium: 3.4 mmol/L — ABNORMAL LOW (ref 3.5–5.1)
Sodium: 138 mmol/L (ref 135–145)

## 2022-07-24 LAB — CBC
HCT: 35.6 % — ABNORMAL LOW (ref 39.0–52.0)
Hemoglobin: 11.8 g/dL — ABNORMAL LOW (ref 13.0–17.0)
MCH: 33.3 pg (ref 26.0–34.0)
MCHC: 33.1 g/dL (ref 30.0–36.0)
MCV: 100.6 fL — ABNORMAL HIGH (ref 80.0–100.0)
Platelets: 175 10*3/uL (ref 150–400)
RBC: 3.54 MIL/uL — ABNORMAL LOW (ref 4.22–5.81)
RDW: 12.8 % (ref 11.5–15.5)
WBC: 4.5 10*3/uL (ref 4.0–10.5)
nRBC: 0 % (ref 0.0–0.2)

## 2022-07-24 LAB — MAGNESIUM: Magnesium: 2.2 mg/dL (ref 1.7–2.4)

## 2022-07-24 MED ORDER — POTASSIUM CHLORIDE CRYS ER 20 MEQ PO TBCR
40.0000 meq | EXTENDED_RELEASE_TABLET | Freq: Once | ORAL | Status: AC
  Administered 2022-07-24: 40 meq via ORAL
  Filled 2022-07-24: qty 2

## 2022-07-24 MED ORDER — POLYETHYLENE GLYCOL 3350 17 G PO PACK
17.0000 g | PACK | Freq: Every day | ORAL | Status: DC
  Administered 2022-07-24 – 2022-07-25 (×2): 17 g via ORAL
  Filled 2022-07-24 (×3): qty 1

## 2022-07-24 MED ORDER — DOCUSATE SODIUM 100 MG PO CAPS
100.0000 mg | ORAL_CAPSULE | Freq: Two times a day (BID) | ORAL | Status: DC
  Administered 2022-07-24 – 2022-07-25 (×4): 100 mg via ORAL
  Filled 2022-07-24 (×5): qty 1

## 2022-07-24 NOTE — TOC Progression Note (Addendum)
Transition of Care [facility]) - Progression Note    Patient Details  Name: Frederick Ponce MRN: [id number] Date of Birth: 1946/10/15  Transition of Care [facility]) CM/SW Contact  Ross Ludwig, [place] Phone Number: 07/24/2022, 4:38 PM  Clinical Narrative:     CSW was informed that [place] is not able to accept patient.  Per psych patient needs to be faxed to other [place] facilities.  CSW has faxed to [facility], [facility], [facility]), [place], [place], [place].  CSW awaiting for bed offers.  CSW to continue to follow for bed placement.  Expected Discharge Plan: Psychiatric Hospital Barriers to Discharge: Other (must enter comment) (Waiting for [facility] to review.)  Expected Discharge Plan and Services Expected Discharge Plan: [facility]         Expected Discharge Date: 07/23/22                                     Social Determinants of Health (SDOH) Interventions    Readmission Risk Interventions     No data to display

## 2022-07-24 NOTE — Progress Notes (Signed)
Seen and examined at bedside this morning, no further complaints.  Vital signs remained stable. Currently denying any suicidal or homicidal ideation.  Clear to auscultation bilaterally.  Bowel regimen to be given per patient's request.  Rest the recommendations as mentioned on 7/31 in dc summary, updated today.  Discussed with RN and TOC Call with further questions as needed      Gerlean Ren MD [facility]

## 2022-07-24 NOTE — Consult Note (Signed)
Face-to-Face Psychiatry Consult   Reason for Consult:  suicide attempt via overdose Referring Physician:  Dr. Reesa Chew  Patient Identification: Frederick Ponce MRN:  [id number] Principal Diagnosis: Acute respiratory failure with hypoxia [facility]) Diagnosis:  Principal Problem:   Acute respiratory failure with hypoxia ([place]) Active Problems:   HLD (hyperlipidemia)   Parkinson's disease ([place])   Acute encephalopathy   Drug overdose, intentional, initial encounter ([place])   Acute prerenal azotemia   GAD (generalized anxiety disorder)   Overdose   Total Time spent with patient: 1 hour  Subjective:   Frederick Ponce is a 76 y.o. male with medical history significant for metastatic prostate cancer, hyperlipidemia, Parkinson's disease, generalized anxiety disorder, who is admitted to [facility] on 07/21/2022 with acute hypoxic respiratory distress after presenting from home to [facility] ED for evaluation of altered mental status.    Frederick Ponce is a 76 y.o.  male with no previous past psychiatric history who presents to the emergency department after suicide attempt by overdose on pain medications. He endorses ongoing suffering and pain that lead to his attempt. He endorses " wreckless and impulsive attempt not well thought out. I dint understand the ramifications and or punishment associated with a suicide attempt." He remains remorseful for his actions, and shows improved insight at this time.  He reports prior to this suicide attempt, he has never had these thoughts before.  He currently denies suicidal ideation, suicidal thoughts, and or self-harm.  He is able to contract for safety while on the unit, and remains under one-to-one observation at this time due to his suicide attempt of high lethality.   Patient is able to vocalize his pain is a major contributing factor to his worsening suicidal ideations and worsening depressive symptoms at this time.  While patient pain has contributed to  his worsening mood and increase in suicidal ideations, he continues to benefit from inpatient psychiatric hospitalization for crisis stabilization, coping skills and therapy, and affective medication management.  Patient endorses a sense of loss and autonomy, independence, overall functioning related to his cancer diagnosis, however he does have some motivation and appears future oriented to seek appropriate help in the inpatient setting.  HPI:   The patient reports that he does recall taking the medications.  The patient admits to having suicidal thoughts with intent to kill himself by overdosing.  The patient recalls thinking to himself "what am i doing".  Initially the patient denies the overdose as due to overwhelm about his cancer treatment, progression, prognosis, and pain; then he does admits to this later on during the interview.  He reports that he overdosed "because I could not deal with it".  The patient reports that his mood has been dysphoric off and on for sometime.  But denies pervasive sadness during the last 2 weeks.  Denies anhedonia.  He reports that his sleep is poor but is unable to elaborate further and starts talking about him and his partner installing a fence in the backyard.  He otherwise does not comment on symptoms regarding energy, sleep, motivation.  He does deny HI.  He does deny having suicidal thoughts at this time.  The patient seems genuinely surprised that his overdose had serious medical consequences.  Otherwise the patient does not comment on having symptoms of anxiety, panic attacks, mania or psychosis when asked, and tangentially responds with comments about his father and himself being gay.  Past Psychiatric History: Denies previous psychiatric diagnosis.  Denies previous psychiatric hospitalization or  history of suicide  Risk to Self:  Yes Risk to Others:   Denies Prior Inpatient Therapy:   Denies Prior Outpatient Therapy:  Denies  Past Medical History:  Past  Medical History:  Diagnosis Date   Hyperlipidemia    Parkinson disease ([place])    Prostate cancer ([place])    History reviewed. No pertinent surgical history. Family History:  Family History  Problem Relation Age of Onset   Tremor Father    Parkinson's disease Father    Melanoma Sister    Parkinson's disease Sister    Parkinson's disease Paternal Grandmother    Family Psychiatric  History: Patient is unable to answer these questions  Social History:  Social History   Substance and Sexual Activity  Alcohol Use Yes   Alcohol/week: 14.0 standard drinks of alcohol   Types: 14 Glasses of wine per week     Social History   Substance and Sexual Activity  Drug Use Never    Social History   Socioeconomic History   Marital status: Married    Spouse name: Not on file   Number of children: Not on file   Years of education: Not on file   Highest education level: Not on file  Occupational History   Not on file  Tobacco Use   Smoking status: Never   Smokeless tobacco: Never  Vaping Use   Vaping Use: Never used  Substance and Sexual Activity   Alcohol use: Yes    Alcohol/week: 14.0 standard drinks of alcohol    Types: 14 Glasses of wine per week   Drug use: Never   Sexual activity: Not on file  Other Topics Concern   Not on file  Social History Narrative   Not on file   Social Determinants of Health   Financial Resource Strain: Not on file  Food Insecurity: Not on file  Transportation Needs: Not on file  Physical Activity: Not on file  Stress: Not on file  Social Connections: Not on file   Additional Social History:    Allergies:   Allergies  Allergen Reactions   Gabapentin Other (See Comments)    Restless legs/confusion   Celebrex [Celecoxib] Rash    Labs:  Results for orders placed or performed during the hospital encounter of 07/21/22 (from the past 48 hour(s))  Acetaminophen level     Status: Abnormal   Collection Time: 07/22/22  1:05 PM  Result Value  Ref Range   Acetaminophen (Tylenol), Serum <10 (L) 10 - 30 ug/mL    Comment: (NOTE) Therapeutic concentrations vary significantly. A range of 10-30 ug/mL  may be an effective concentration for many patients. However, some  are best treated at concentrations outside of this range. Acetaminophen concentrations >150 ug/mL at 4 hours after ingestion  and >50 ug/mL at 12 hours after ingestion are often associated with  toxic reactions.  Performed at [facility], [place] [address], [place], [place] [address]   Salicylate level     Status: Abnormal   Collection Time: 07/22/22  1:05 PM  Result Value Ref Range   Salicylate Lvl <4.3 (L) 7.0 - 30.0 mg/dL    Comment: Performed at [facility], [place] [address], [place], [place] [address]  Basic metabolic panel     Status: Abnormal   Collection Time: 07/23/22  5:13 AM  Result Value Ref Range   Sodium 137 135 - 145 mmol/L   Potassium 3.4 (L) 3.5 - 5.1 mmol/L   Chloride 105 98 - 111  mmol/L   CO2 24 22 - 32 mmol/L   Glucose, Bld 105 (H) 70 - 99 mg/dL    Comment: Glucose reference range applies only to samples taken after fasting for at least 8 hours.   BUN 8 8 - 23 mg/dL   Creatinine, Ser 0.51 (L) 0.61 - 1.24 mg/dL   Calcium 7.5 (L) 8.9 - 10.3 mg/dL   GFR, Estimated >60 >60 mL/min    Comment: (NOTE) Calculated using the CKD-EPI Creatinine Equation (2021)    Anion gap 8 5 - 15    Comment: Performed at [facility], [place] [address] [place], [place] [address]  CBC     Status: Abnormal   Collection Time: 07/23/22  5:13 AM  Result Value Ref Range   WBC 7.9 4.0 - 10.5 K/uL   RBC 3.35 (L) 4.22 - 5.81 MIL/uL   Hemoglobin 11.2 (L) 13.0 - 17.0 g/dL   HCT 35.2 (L) 39.0 - 52.0 %   MCV 105.1 (H) 80.0 - 100.0 fL   MCH 33.4 26.0 - 34.0 pg   MCHC 31.8 30.0 - 36.0 g/dL   RDW 13.3 11.5 - 15.5 %   Platelets 175 150 - 400 K/uL   nRBC 0.0 0.0 - 0.2 %    Comment: Performed at [facility], [place] [address] [place], [place] [address]  Magnesium     Status: None   Collection Time: 07/23/22  5:13 AM  Result Value Ref Range   Magnesium 1.9 1.7 - 2.4 mg/dL    Comment: Performed at [facility], [place] [address] [place], [place] [address]  Basic metabolic panel     Status: Abnormal   Collection Time: 07/24/22  4:42 AM  Result Value Ref Range   Sodium 138 135 - 145 mmol/L   Potassium 3.4 (L) 3.5 - 5.1 mmol/L   Chloride 105 98 - 111 mmol/L   CO2 25 22 - 32 mmol/L   Glucose, Bld 91 70 - 99 mg/dL    Comment: Glucose reference range applies only to samples taken after fasting for at least 8 hours.   BUN 5 (L) 8 - 23 mg/dL   Creatinine, Ser 0.57 (L) 0.61 - 1.24 mg/dL   Calcium 7.6 (L) 8.9 - 10.3 mg/dL   GFR, Estimated >60 >60 mL/min    Comment: (NOTE) Calculated using the CKD-EPI Creatinine Equation (2021)    Anion gap 8 5 - 15    Comment: Performed at [facility], [place] [address] [place], [place] [address]  CBC     Status: Abnormal   Collection Time: 07/24/22  4:42 AM  Result Value Ref Range   WBC 4.5 4.0 - 10.5 K/uL   RBC 3.54 (L) 4.22 - 5.81 MIL/uL   Hemoglobin 11.8 (L) 13.0 - 17.0 g/dL   HCT 35.6 (L) 39.0 - 52.0 %   MCV 100.6 (H) 80.0 - 100.0 fL   MCH 33.3 26.0 - 34.0 pg   MCHC 33.1 30.0 - 36.0 g/dL   RDW 12.8 11.5 - 15.5 %   Platelets 175 150 - 400 K/uL   nRBC 0.0 0.0 - 0.2 %    Comment: Performed at [facility], [place] [address] [place], [place] [address]  Magnesium     Status: None   Collection Time: 07/24/22  4:42 AM  Result Value Ref Range   Magnesium 2.2 1.7 - 2.4 mg/dL    Comment: Performed at [facility], [place]  [place], [place] [address]    Current Facility-Administered Medications  Medication Dose Route Frequency Provider Last Rate Last Admin   abiraterone acetate (ZYTIGA) tablet 1,000 mg  1,000 mg Oral Daily Howerter, Justin B, DO   1,000 mg at 07/24/22 1101    acetaminophen (TYLENOL) tablet 650 mg  650 mg Oral Q6H PRN Howerter, Justin B, DO       Or   acetaminophen (TYLENOL) suppository 650 mg  650 mg Rectal Q6H PRN Howerter, Justin B, DO       atorvastatin (LIPITOR) tablet 40 mg  40 mg Oral QHS Howerter, Justin B, DO   40 mg at 07/23/22 2147   carbidopa-levodopa (SINEMET IR) 25-100 MG per tablet immediate release 1 tablet  1 tablet Oral 3 times per day Howerter, Justin B, DO   1 tablet at 07/24/22 0951   Chlorhexidine Gluconate Cloth 2 % PADS 6 each  6 each Topical Daily Damita Lack, MD   6 each at 07/24/22 0951   docusate sodium (COLACE) capsule 100 mg  100 mg Oral BID Amin, Ankit Chirag, MD   100 mg at 07/24/22 1101   DULoxetine (CYMBALTA) DR capsule 20 mg  20 mg Oral Daily Howerter, Justin B, DO   20 mg at 07/24/22 0951   guaiFENesin (ROBITUSSIN) 100 MG/5ML liquid 5 mL  5 mL Oral Q4H PRN Amin, Ankit Chirag, MD       hydrALAZINE (APRESOLINE) injection 10 mg  10 mg Intravenous Q4H PRN Amin, Ankit Chirag, MD       ipratropium-albuterol (DUONEB) 0.5-2.5 (3) MG/3ML nebulizer solution 3 mL  3 mL Nebulization Q4H PRN Amin, Ankit Chirag, MD       metoprolol tartrate (LOPRESSOR) injection 5 mg  5 mg Intravenous Q4H PRN Amin, Ankit Chirag, MD       naloxone (NARCAN) injection 0.4 mg  0.4 mg Intravenous PRN Howerter, Justin B, DO       Oral care mouth rinse  15 mL Mouth Rinse PRN Amin, Ankit Chirag, MD       polyethylene glycol (MIRALAX / GLYCOLAX) packet 17 g  17 g Oral Daily Amin, Ankit Chirag, MD   17 g at 07/24/22 1101   predniSONE (DELTASONE) tablet 5 mg  5 mg Oral Q breakfast Amin, Ankit Chirag, MD   5 mg at 07/24/22 0951   propranolol (INDERAL) tablet 40 mg  40 mg Oral Daily Howerter, Justin B, DO   40 mg at 07/24/22 0951   senna-docusate (Senokot-S) tablet 1 tablet  1 tablet Oral QHS PRN Damita Lack, MD              Psychiatric Specialty Exam:  Presentation  General Appearance: Appropriate for Environment; Casual  Eye  Contact:Fair  Speech:Clear and Coherent; Normal Rate  Speech Volume:Normal  Handedness:Right  Mood and Affect  Mood:Anxious  Affect:Appropriate; Congruent   Thought Process  Thought Processes:Coherent; Linear  Descriptions of Associations:Intact Orientation:Full (Time, Place and Person) Thought Content:Logical  History of Schizophrenia/Schizoaffective disorder:No data recorded Duration of Psychotic Symptoms:No data recorded Hallucinations:Hallucinations: None  Ideas of Reference:None  Suicidal Thoughts:Suicidal Thoughts: No  Homicidal Thoughts:Homicidal Thoughts: No   Sensorium  Memory:Recent Good; Immediate Good; Remote Fair  Judgment:Fair  Insight:Fair   Executive Functions  Concentration:Fair  Attention Span:Good  [place] of Knowledge:Good Language:Good  Psychomotor Activity  Psychomotor Activity:Psychomotor Activity: Normal  Assets  Assets:Communication Skills; Desire for Improvement; Financial Resources/Insurance; Housing; Social Support; Resilience; Physical Health; Leisure Time; Transportation  Sleep  Sleep:Sleep: Fair  Physical Exam: Physical Exam Vitals and nursing note reviewed.  Constitutional:      Appearance: Normal appearance. He is normal weight.  HENT:     Head: Normocephalic.  Neurological:     General: No focal deficit present.     Mental Status: He is alert and oriented to person, place, and time. Mental status is at baseline.  Psychiatric:        Attention and Perception: Attention and perception normal.        Mood and Affect: Mood is anxious.        Speech: Speech normal. He is communicative.        Behavior: Behavior normal. Behavior is cooperative.        Thought Content: Thought content includes suicidal ideation. Thought content includes suicidal plan.        Cognition and Memory: Cognition and memory normal.        Judgment: Judgment normal.    Review of Systems  Psychiatric/Behavioral:  Positive for  suicidal ideas. The patient is nervous/anxious and has insomnia.   All other systems reviewed and are negative.  Blood pressure (!) 152/88, pulse (!) 102, temperature 97.9 F (36.6 C), temperature source Oral, resp. rate 18, height '5\' 8"'$  (1.727 m), weight 78 kg, SpO2 98 %. Body mass index is 26.15 kg/m.  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  Patient meets criteria for inpatient psychiatric hospitalization once he is medically cleared.  Patient had severe suicide attempt, with high lethality potential.  He does show some insight into his suicide attempt, and appears remorseful for his actions. He is open to seeking inpatient admission for therapy, crisis stabilization, and medication management.  -Consult social work for referral for inpatient psychiatric hospitalization, when he is medically cleared -Continue Cymbalta 20 mg once daily   Disposition: Recommend psychiatric Inpatient admission when medically cleared.  Suella Broad, FNP 07/24/2022 12:44 PM

## 2022-07-25 ENCOUNTER — Ambulatory Visit: Payer: Medicare Other

## 2022-07-25 ENCOUNTER — Ambulatory Visit: Payer: Medicare Other | Admitting: Oncology

## 2022-07-25 LAB — CBC
HCT: 36.5 % — ABNORMAL LOW (ref 39.0–52.0)
Hemoglobin: 12.2 g/dL — ABNORMAL LOW (ref 13.0–17.0)
MCH: 33.3 pg (ref 26.0–34.0)
MCHC: 33.4 g/dL (ref 30.0–36.0)
MCV: 99.7 fL (ref 80.0–100.0)
Platelets: 206 10*3/uL (ref 150–400)
RBC: 3.66 MIL/uL — ABNORMAL LOW (ref 4.22–5.81)
RDW: 12.7 % (ref 11.5–15.5)
WBC: 5.5 10*3/uL (ref 4.0–10.5)
nRBC: 0 % (ref 0.0–0.2)

## 2022-07-25 LAB — BASIC METABOLIC PANEL
Anion gap: 8 (ref 5–15)
BUN: 8 mg/dL (ref 8–23)
CO2: 23 mmol/L (ref 22–32)
Calcium: 7.9 mg/dL — ABNORMAL LOW (ref 8.9–10.3)
Chloride: 108 mmol/L (ref 98–111)
Creatinine, Ser: 0.58 mg/dL — ABNORMAL LOW (ref 0.61–1.24)
GFR, Estimated: >60 mL/min (ref >60)
Glucose, Bld: 77 mg/dL (ref 70–99)
Potassium: 3.5 mmol/L (ref 3.5–5.1)
Sodium: 139 mmol/L (ref 135–145)

## 2022-07-25 LAB — MAGNESIUM: Magnesium: 2.4 mg/dL (ref 1.7–2.4)

## 2022-07-25 MED ORDER — AMLODIPINE BESYLATE 5 MG PO TABS
5.0000 mg | ORAL_TABLET | Freq: Every day | ORAL | Status: DC
  Administered 2022-07-25: 5 mg via ORAL
  Filled 2022-07-25 (×2): qty 1

## 2022-07-25 MED ORDER — PREDNISONE 5 MG PO TABS: 5.0000 mg | ORAL_TABLET | Freq: Every day | ORAL | Status: DC

## 2022-07-25 MED ORDER — POTASSIUM CHLORIDE 20 MEQ PO PACK
40.0000 meq | PACK | Freq: Once | ORAL | Status: AC
  Administered 2022-07-25: 40 meq via ORAL
  Filled 2022-07-25: qty 2

## 2022-07-25 NOTE — TOC Progression Note (Signed)
Transition of Care [facility]) - Progression Note    Patient Details  Name: Frederick Ponce MRN: [id number] Date of Birth: Jun 08, 1946  Transition of Care [facility]) CM/SW Contact  Purcell Mouton, RN Phone Number: 07/25/2022, 1:55 PM  Clinical Narrative:    A call was made to [facility] with no answer. Clinicals faxed to [facility] and [facility] today. Pt was faxed too [facility] in [place], [place], [place], [place], [place], [place], [place], [place], [facility] with no bed offers.    Expected Discharge Plan: Psychiatric Hospital Barriers to Discharge: Other (must enter comment) (Waiting for [facility] to review.)  Expected Discharge Plan and Services Expected Discharge Plan: [facility]         Expected Discharge Date: 07/23/22                                     Social Determinants of Health (SDOH) Interventions    Readmission Risk Interventions     No data to display

## 2022-07-25 NOTE — Progress Notes (Signed)
Tap water enema given - bowel movement produced, patient reports feeling relief.

## 2022-07-25 NOTE — Progress Notes (Signed)
Patient seen and examined by me this morning.  Does not have any complaints.  He is doing well. Vital signs remained stable.  Clear to auscultation bilaterally, abdomen is nontender nondistended.  Discharge summary completed 7/31, updated today on 8/2.  Discussed case with patient's RN, TOC and charge nurse as well.  Gerlean Ren MD [facility]

## 2022-07-25 NOTE — Care Management Important Message (Signed)
Important Message  Patient Details IM Letter given to the Patient. Name: Frederick Ponce MRN: [id number] Date of Birth: Jan 23, 1946   Medicare Important Message Given:  Yes     Kerin Salen 07/25/2022, 9:36 AM

## 2022-07-25 NOTE — TOC Progression Note (Signed)
Transition of Care [facility]) - Progression Note    Patient Details  Name: Frederick Ponce MRN: [id number] Date of Birth: 06-06-1946  Transition of Care [facility]) CM/SW Contact  Purcell Mouton, RN Phone Number: 07/25/2022, 11:26 AM  Clinical Narrative:    No bed offers at [facility].    Expected Discharge Plan: Psychiatric Hospital Barriers to Discharge: Other (must enter comment) (Waiting for [facility] to review.)  Expected Discharge Plan and Services Expected Discharge Plan: [facility]         Expected Discharge Date: 07/23/22                                     Social Determinants of Health (SDOH) Interventions    Readmission Risk Interventions     No data to display

## 2022-07-26 DIAGNOSIS — T40602A Poisoning by unspecified narcotics, intentional self-harm, initial encounter: Secondary | ICD-10-CM

## 2022-07-26 LAB — CBC
HCT: 39.3 % (ref 39.0–52.0)
Hemoglobin: 13.2 g/dL (ref 13.0–17.0)
MCH: 33.5 pg (ref 26.0–34.0)
MCHC: 33.6 g/dL (ref 30.0–36.0)
MCV: 99.7 fL (ref 80.0–100.0)
Platelets: 247 10*3/uL (ref 150–400)
RBC: 3.94 MIL/uL — ABNORMAL LOW (ref 4.22–5.81)
RDW: 12.6 % (ref 11.5–15.5)
WBC: 6.5 10*3/uL (ref 4.0–10.5)
nRBC: 0 % (ref 0.0–0.2)

## 2022-07-26 LAB — BASIC METABOLIC PANEL
Anion gap: 11 (ref 5–15)
BUN: 12 mg/dL (ref 8–23)
CO2: 21 mmol/L — ABNORMAL LOW (ref 22–32)
Calcium: 8.1 mg/dL — ABNORMAL LOW (ref 8.9–10.3)
Chloride: 107 mmol/L (ref 98–111)
Creatinine, Ser: 0.69 mg/dL (ref 0.61–1.24)
GFR, Estimated: >60 mL/min (ref >60)
Glucose, Bld: 101 mg/dL — ABNORMAL HIGH (ref 70–99)
Potassium: 3.2 mmol/L — ABNORMAL LOW (ref 3.5–5.1)
Sodium: 139 mmol/L (ref 135–145)

## 2022-07-26 LAB — MAGNESIUM: Magnesium: 2.5 mg/dL — ABNORMAL HIGH (ref 1.7–2.4)

## 2022-07-26 MED ORDER — SIMETHICONE 80 MG PO CHEW
160.0000 mg | CHEWABLE_TABLET | Freq: Four times a day (QID) | ORAL | Status: DC | PRN
Start: 2022-07-26 — End: 2022-07-26
  Administered 2022-07-26: 160 mg via ORAL
  Filled 2022-07-26: qty 2

## 2022-07-26 MED ORDER — ONDANSETRON 4 MG PO TBDP
4.0000 mg | ORAL_TABLET | Freq: Three times a day (TID) | ORAL | Status: DC | PRN
  Administered 2022-07-26: 4 mg via ORAL
  Filled 2022-07-26: qty 1

## 2022-07-26 MED ORDER — CALCIUM CARBONATE ANTACID 500 MG PO CHEW
1.0000 | CHEWABLE_TABLET | Freq: Three times a day (TID) | ORAL | Status: DC | PRN
  Filled 2022-07-26: qty 1

## 2022-07-26 MED ORDER — POLYETHYLENE GLYCOL 3350 17 G PO PACK: 17.0000 g | PACK | Freq: Every day | ORAL | Status: DC | PRN

## 2022-07-26 MED ORDER — AMLODIPINE BESYLATE 5 MG PO TABS: 5.0000 mg | ORAL_TABLET | Freq: Every day | ORAL | 0 refills | Status: DC

## 2022-07-26 MED ORDER — POTASSIUM CHLORIDE 20 MEQ PO PACK
40.0000 meq | PACK | ORAL | Status: DC
  Administered 2022-07-26: 40 meq via ORAL
  Filled 2022-07-26: qty 2

## 2022-07-26 NOTE — TOC Progression Note (Signed)
Transition of Care [facility]) - Progression Note    Patient Details  Name: Frederick Ponce MRN: [id number] Date of Birth: [birth year]  Transition of Care [facility]) CM/SW Contact  Servando Snare, [place] Phone Number: 07/26/2022, 11:29 AM  Clinical Narrative:   Patient faxed out to the following facilities:  [facility] [place]  Awaiting bed offers. TOC will continue to follow.     Expected Discharge Plan: Psychiatric Hospital Barriers to Discharge: Other (must enter comment) (Waiting for [facility] to review.)  Expected Discharge Plan and Services Expected Discharge Plan: [facility]         Expected Discharge Date: 07/23/22                                     Social Determinants of Health (SDOH) Interventions    Readmission Risk Interventions     No data to display

## 2022-07-26 NOTE — Consult Note (Signed)
Face-to-Face Psychiatry Consult   Reason for Consult:  suicide attempt via overdose Referring Physician:  Dr. Reesa Chew  Patient Identification: Frederick Ponce MRN:  [id number] Principal Diagnosis: Acute respiratory failure with hypoxia [facility]) Diagnosis:  Principal Problem:   Acute respiratory failure with hypoxia ([place]) Active Problems:   HLD (hyperlipidemia)   Parkinson's disease ([place])   Acute encephalopathy   Drug overdose, intentional, initial encounter ([place])   Acute prerenal azotemia   GAD (generalized anxiety disorder)   Overdose   Total Time spent with patient: 1 hour  Subjective:   Frederick Ponce is a 76 y.o. male with medical history significant for metastatic prostate cancer, hyperlipidemia, Parkinson's disease, generalized anxiety disorder, who is admitted to [facility] on 07/21/2022 with acute hypoxic respiratory distress after presenting from home to [facility] ED for evaluation of altered mental status.    Frederick Ponce is a 76 y.o.  male with no previous past psychiatric history who presents to the emergency department after suicide attempt by overdose on pain medications. He initially presented with suicide attempt of high lethality, and reports instant regret and remorse. He is able to identify his weaknesses, guilt, and pain that resulted in his actions. He becomes tearful at the thought of leaving his support system, and the overabundance amount of support and love they have shown since his attempt. He describes his mood as " ok" , and his affect is congruent. He reports that he feels he is wasting a way, as no bed has become available for him and interested in other treatment options. He adamantly denies and continues to refute all suicidal ideations, suicidal thoughts, and suicide attempts at this time.   On evaluation, he is alert and oriented x4, calm and cooperative, very pleasant and jovial. He does become tearful at times as he is disappointed in himself  for his actions. He denies any acute symptoms of mania at this time to include impulsivity, grandiosity, mood lability, hypersexuality.  He does not present with any of the above symptoms on this evaluation.  He denies any acute psychosis, paranoia.  He does not appear to be displaying any or responding to internal stimuli, external stimuli, or exhibiting delusional thought disorder.  Patient denies any access to weapons, denies any alcohol and or substance abuse.  He reports moderate sleep and fair appetite.   Patient denies any auditory and/or visual hallucinations, does not appear to be responding to internal or external stimuli.  There is no evidence of delusional thought content and patient appears to answer all questions appropriately.  At this time patient appears to be psychiatrically stable to discharge home, with support system services in place.      HPI:   The patient reports that he does recall taking the medications.  The patient admits to having suicidal thoughts with intent to kill himself by overdosing.  The patient recalls thinking to himself "what am i doing".  Initially the patient denies the overdose as due to overwhelm about his cancer treatment, progression, prognosis, and pain; then he does admits to this later on during the interview.  He reports that he overdosed "because I could not deal with it".  The patient reports that his mood has been dysphoric off and on for sometime.  But denies pervasive sadness during the last 2 weeks.  Denies anhedonia.  He reports that his sleep is poor but is unable to elaborate further and starts talking about him and his partner installing a fence in the  backyard.  He otherwise does not comment on symptoms regarding energy, sleep, motivation.  He does deny HI.  He does deny having suicidal thoughts at this time.  The patient seems genuinely surprised that his overdose had serious medical consequences.  Otherwise the patient does not comment on having  symptoms of anxiety, panic attacks, mania or psychosis when asked, and tangentially responds with comments about his father and himself being gay.  Past Psychiatric History: Denies previous psychiatric diagnosis.  Denies previous psychiatric hospitalization or history of suicide  Risk to Self:  Yes Risk to Others:   Denies Prior Inpatient Therapy:   Denies Prior Outpatient Therapy:  Denies  Past Medical History:  Past Medical History:  Diagnosis Date   Hyperlipidemia    Parkinson disease ([place])    Prostate cancer ([place])    History reviewed. No pertinent surgical history. Family History:  Family History  Problem Relation Age of Onset   Tremor Father    Parkinson's disease Father    Melanoma Sister    Parkinson's disease Sister    Parkinson's disease Paternal Grandmother    Family Psychiatric  History: Patient is unable to answer these questions  Social History:  Social History   Substance and Sexual Activity  Alcohol Use Yes   Alcohol/week: 14.0 standard drinks of alcohol   Types: 14 Glasses of wine per week     Social History   Substance and Sexual Activity  Drug Use Never    Social History   Socioeconomic History   Marital status: Married    Spouse name: Not on file   Number of children: Not on file   Years of education: Not on file   Highest education level: Not on file  Occupational History   Not on file  Tobacco Use   Smoking status: Never   Smokeless tobacco: Never  Vaping Use   Vaping Use: Never used  Substance and Sexual Activity   Alcohol use: Yes    Alcohol/week: 14.0 standard drinks of alcohol    Types: 14 Glasses of wine per week   Drug use: Never   Sexual activity: Not on file  Other Topics Concern   Not on file  Social History Narrative   Not on file   Social Determinants of Health   Financial Resource Strain: Not on file  Food Insecurity: Not on file  Transportation Needs: Not on file  Physical Activity: Not on file  Stress: Not on  file  Social Connections: Not on file   Additional Social History:    Allergies:   Allergies  Allergen Reactions   Gabapentin Other (See Comments)    Restless legs/confusion   Celebrex [Celecoxib] Rash    Labs:  Results for orders placed or performed during the hospital encounter of 07/21/22 (from the past 48 hour(s))  Basic metabolic panel     Status: Abnormal   Collection Time: 07/25/22  5:01 AM  Result Value Ref Range   Sodium 139 135 - 145 mmol/L   Potassium 3.5 3.5 - 5.1 mmol/L   Chloride 108 98 - 111 mmol/L   CO2 23 22 - 32 mmol/L   Glucose, Bld 77 70 - 99 mg/dL    Comment: Glucose reference range applies only to samples taken after fasting for at least 8 hours.   BUN 8 8 - 23 mg/dL   Creatinine, Ser 0.58 (L) 0.61 - 1.24 mg/dL   Calcium 7.9 (L) 8.9 - 10.3 mg/dL   GFR, Estimated >60 >60 mL/min  Comment: (NOTE) Calculated using the CKD-EPI Creatinine Equation (2021)    Anion gap 8 5 - 15    Comment: Performed at [facility], [place] [address] [place], [place] [address]  CBC     Status: Abnormal   Collection Time: 07/25/22  5:01 AM  Result Value Ref Range   WBC 5.5 4.0 - 10.5 K/uL   RBC 3.66 (L) 4.22 - 5.81 MIL/uL   Hemoglobin 12.2 (L) 13.0 - 17.0 g/dL   HCT 36.5 (L) 39.0 - 52.0 %   MCV 99.7 80.0 - 100.0 fL   MCH 33.3 26.0 - 34.0 pg   MCHC 33.4 30.0 - 36.0 g/dL   RDW 12.7 11.5 - 15.5 %   Platelets 206 150 - 400 K/uL   nRBC 0.0 0.0 - 0.2 %    Comment: Performed at [facility], [place] [address], [place], [place] [address]  Magnesium     Status: None   Collection Time: 07/25/22  5:01 AM  Result Value Ref Range   Magnesium 2.4 1.7 - 2.4 mg/dL    Comment: Performed at [facility], [place] [address], [place], [place] [address]  Basic metabolic panel     Status: Abnormal   Collection Time: 07/26/22  5:34 AM  Result Value Ref Range   Sodium 139 135 - 145 mmol/L   Potassium 3.2 (L) 3.5 - 5.1 mmol/L   Chloride  107 98 - 111 mmol/L   CO2 21 (L) 22 - 32 mmol/L   Glucose, Bld 101 (H) 70 - 99 mg/dL    Comment: Glucose reference range applies only to samples taken after fasting for at least 8 hours.   BUN 12 8 - 23 mg/dL   Creatinine, Ser 0.69 0.61 - 1.24 mg/dL   Calcium 8.1 (L) 8.9 - 10.3 mg/dL   GFR, Estimated >60 >60 mL/min    Comment: (NOTE) Calculated using the CKD-EPI Creatinine Equation (2021)    Anion gap 11 5 - 15    Comment: Performed at [facility], [place] [address], [place], [place] [address]  CBC     Status: Abnormal   Collection Time: 07/26/22  5:34 AM  Result Value Ref Range   WBC 6.5 4.0 - 10.5 K/uL   RBC 3.94 (L) 4.22 - 5.81 MIL/uL   Hemoglobin 13.2 13.0 - 17.0 g/dL   HCT 39.3 39.0 - 52.0 %   MCV 99.7 80.0 - 100.0 fL   MCH 33.5 26.0 - 34.0 pg   MCHC 33.6 30.0 - 36.0 g/dL   RDW 12.6 11.5 - 15.5 %   Platelets 247 150 - 400 K/uL   nRBC 0.0 0.0 - 0.2 %    Comment: Performed at [facility], [place] [address] [place], [place] [address]  Magnesium     Status: Abnormal   Collection Time: 07/26/22  5:34 AM  Result Value Ref Range   Magnesium 2.5 (H) 1.7 - 2.4 mg/dL    Comment: Performed at [facility], [place] [address] [place], [place] [address]    Current Facility-Administered Medications  Medication Dose Route Frequency Provider Last Rate Last Admin   abiraterone acetate (ZYTIGA) tablet 1,000 mg  1,000 mg Oral Daily Howerter, Justin B, DO   1,000 mg at 07/25/22 1302   acetaminophen (TYLENOL) tablet 650 mg  650 mg Oral Q6H PRN Howerter, Justin B, DO       Or   acetaminophen (TYLENOL) suppository 650 mg  650 mg  Rectal Q6H PRN Howerter, Justin B, DO       amLODipine (NORVASC) tablet 5 mg  5 mg Oral Daily Amin, Ankit Chirag, MD   5 mg at 07/25/22 1106   atorvastatin (LIPITOR) tablet 40 mg  40 mg Oral QHS Howerter, Justin B, DO   40 mg at 07/25/22 2012   calcium carbonate (TUMS - dosed in mg elemental calcium) chewable tablet  200 mg of elemental calcium  1 tablet Oral TID PRN Kristopher Oppenheim, DO       carbidopa-levodopa (SINEMET IR) 25-100 MG per tablet immediate release 1 tablet  1 tablet Oral 3 times per day Howerter, Justin B, DO   1 tablet at 07/26/22 0848   Chlorhexidine Gluconate Cloth 2 % PADS 6 each  6 each Topical Daily Damita Lack, MD   6 each at 07/25/22 1107   docusate sodium (COLACE) capsule 100 mg  100 mg Oral BID Amin, Ankit Chirag, MD   100 mg at 07/25/22 2012   DULoxetine (CYMBALTA) DR capsule 20 mg  20 mg Oral Daily Howerter, Justin B, DO   20 mg at 07/26/22 0848   guaiFENesin (ROBITUSSIN) 100 MG/5ML liquid 5 mL  5 mL Oral Q4H PRN Amin, Ankit Chirag, MD       hydrALAZINE (APRESOLINE) injection 10 mg  10 mg Intravenous Q4H PRN Amin, Ankit Chirag, MD       ipratropium-albuterol (DUONEB) 0.5-2.5 (3) MG/3ML nebulizer solution 3 mL  3 mL Nebulization Q4H PRN Amin, Ankit Chirag, MD       metoprolol tartrate (LOPRESSOR) injection 5 mg  5 mg Intravenous Q4H PRN Amin, Ankit Chirag, MD       naloxone (NARCAN) injection 0.4 mg  0.4 mg Intravenous PRN Howerter, Justin B, DO       ondansetron (ZOFRAN-ODT) disintegrating tablet 4 mg  4 mg Oral Q8H PRN Kristopher Oppenheim, DO   4 mg at 07/26/22 0135   Oral care mouth rinse  15 mL Mouth Rinse PRN Amin, Ankit Chirag, MD       polyethylene glycol (MIRALAX / GLYCOLAX) packet 17 g  17 g Oral Daily Amin, Ankit Chirag, MD   17 g at 07/25/22 1108   polyethylene glycol (MIRALAX / GLYCOLAX) packet 17 g  17 g Oral Daily PRN Amin, Ankit Chirag, MD       potassium chloride (KLOR-CON) packet 40 mEq  40 mEq Oral Q4H Amin, Ankit Chirag, MD   40 mEq at 07/26/22 0848   predniSONE (DELTASONE) tablet 5 mg  5 mg Oral Q breakfast Amin, Ankit Chirag, MD   5 mg at 07/26/22 0848   senna-docusate (Senokot-S) tablet 1 tablet  1 tablet Oral QHS PRN Amin, Ankit Chirag, MD       simethicone (MYLICON) chewable tablet 160 mg  160 mg Oral QID PRN Kristopher Oppenheim, DO   160 mg at 07/26/22 0135           Psychiatric Specialty Exam:  Presentation  General Appearance: Appropriate for Environment; Casual  Eye Contact:Fair  Speech:Clear and Coherent; Normal Rate  Speech Volume:Normal  Handedness:Right  Mood and Affect  Mood:Anxious  Affect:Appropriate; Congruent   Thought Process  Thought Processes:Coherent; Linear  Descriptions of Associations:Intact Orientation:Full (Time, Place and Person) Thought Content:Logical  History of Schizophrenia/Schizoaffective disorder:No data recorded Duration of Psychotic Symptoms:No data recorded Hallucinations:No data recorded  Ideas of Reference:None  Suicidal Thoughts:No data recorded  Homicidal Thoughts:No data recorded   Sensorium  Memory:Recent Good; Immediate Good; Remote Fair  Judgment:Fair  Insight:Fair   Executive Functions  Concentration:Fair  Attention Span:Good  [place] Language:Good  Psychomotor Activity  Psychomotor Activity:No data recorded  Assets  Assets:Communication Skills; Desire for Improvement; Financial Resources/Insurance; Housing; Social Support; Resilience; Physical Health; Leisure Time; Transportation  Sleep  Sleep:No data recorded   Physical Exam: Physical Exam Vitals and nursing note reviewed.  Constitutional:      Appearance: Normal appearance. He is normal weight.  HENT:     Head: Normocephalic.  Neurological:     General: No focal deficit present.     Mental Status: He is alert and oriented to person, place, and time. Mental status is at baseline.  Psychiatric:        Attention and Perception: Attention and perception normal.        Mood and Affect: Mood and affect normal. Mood is not anxious, depressed or elated. Affect is not labile, blunt, flat, angry, tearful or inappropriate.        Speech: Speech normal. He is communicative.        Behavior: Behavior normal. Behavior is cooperative.        Thought Content: Thought content does  not include suicidal ideation. Thought content does not include suicidal plan.        Cognition and Memory: Cognition and memory normal.        Judgment: Judgment normal.    Review of Systems  Constitutional: Negative.   Psychiatric/Behavioral:  Negative for depression and suicidal ideas. The patient is not nervous/anxious and does not have insomnia.   All other systems reviewed and are negative.  Blood pressure (!) 155/84, pulse 67, temperature 97.7 F (36.5 C), temperature source Oral, resp. rate 18, height '5\' 8"'$  (1.727 m), weight 78 kg, SpO2 97 %. Body mass index is 26.15 kg/m.  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  He does show some insight into his suicide attempt, and appears remorseful for his actions. Patient and husband agreed to partial program. We reviewed course of expectation with program, requirements, and log in details.  -Continue current medications at this time.   Discussed safety plan with patient's husband who verbalized understanding.  Reviewed methods to reduce the risk of self-injury or suicide attempts: Frequent conversations regarding unsafe thoughts. Remove all significant sharps. Remove all firearms. Remove all medications, including over-the-counter meds. Consider lockbox for medications and having a responsible person dispense medications until patient has strengthened coping skills. Room checks for sharps or other harmful objects. Secure all chemical substances that can be ingested or inhaled.  Mr. Glennon Mac is a licensed ER nurse in [place], and acknowledged all of the above.   Disposition: Recommend psychiatric Inpatient admission when medically cleared.  Suella Broad, FNP 07/26/2022 12:51 PM

## 2022-07-26 NOTE — Care Plan (Signed)
Soap suds enema given for abdominal pain. Patient had some relief. Prn Zofran and Simethicone administered as well, patient reported no relief from medication. Patient declined Tylenol and Tums.

## 2022-07-26 NOTE — Progress Notes (Signed)
Patient seen and examined at bedside this morning.  He does not have any complaints.  Overall he is doing well has improved significantly. His physical exam he is clear to auscultation bilaterally, no complaints.  No suicidal or homicidal ideation.  Alert awake oriented X4. Vital signs are stable.  Patient was also seen by psychiatry team who is cleared for patient to go home once outpatient follow-up appointment has been arranged by their service. I also spoke with patient's significant other and updated him about his condition and all the questions have been answered. We will send Norvasc 5 mg daily to his pharmacy for essential hypertension.  We will also update discharge summary that was completed on 7/31 with appropriate changes.  Foley catheter will be removed prior to discharge.  Outpatient urology referral has been given.  Hospital staff updated.  Gerlean Ren MD [facility]

## 2022-07-27 ENCOUNTER — Telehealth: Payer: Self-pay

## 2022-07-27 NOTE — Telephone Encounter (Signed)
Pt called asking for a refill of MS contin, per pt he has enough until Tuesday, will reasses on Tuesday, pt also scheduled a phone visit with [place] on wednsday for a check in, pt reports no further needs at this time.

## 2022-07-30 ENCOUNTER — Telehealth (HOSPITAL_COMMUNITY): Payer: Self-pay | Admitting: Licensed Clinical Social Worker

## 2022-07-30 NOTE — Progress Notes (Signed)
Received voicemail from patient due to recently being in hospital.   RN returned call, voicemail left for call back.

## 2022-07-31 ENCOUNTER — Telehealth: Payer: Self-pay

## 2022-07-31 ENCOUNTER — Other Ambulatory Visit: Payer: Self-pay

## 2022-07-31 ENCOUNTER — Ambulatory Visit (HOSPITAL_COMMUNITY): Payer: Self-pay

## 2022-07-31 MED ORDER — MORPHINE SULFATE ER 15 MG PO TBCR
15.0000 mg | EXTENDED_RELEASE_TABLET | Freq: Every day | ORAL | 0 refills | Status: DC
Start: 2022-07-31 — End: 2022-08-08

## 2022-07-31 NOTE — Telephone Encounter (Signed)
LVM for pt notifying him of refill for pain meds, checked availability with pharmacy.

## 2022-08-01 ENCOUNTER — Inpatient Hospital Stay: Payer: Medicare Other | Attending: Nurse Practitioner | Admitting: Nurse Practitioner

## 2022-08-01 ENCOUNTER — Inpatient Hospital Stay: Payer: Medicare Other | Admitting: Nurse Practitioner

## 2022-08-01 ENCOUNTER — Other Ambulatory Visit: Payer: Self-pay

## 2022-08-01 DIAGNOSIS — C7800 Secondary malignant neoplasm of unspecified lung: Secondary | ICD-10-CM | POA: Insufficient documentation

## 2022-08-01 DIAGNOSIS — C61 Malignant neoplasm of prostate: Secondary | ICD-10-CM | POA: Diagnosis present

## 2022-08-01 DIAGNOSIS — G893 Neoplasm related pain (acute) (chronic): Secondary | ICD-10-CM | POA: Diagnosis not present

## 2022-08-01 DIAGNOSIS — F419 Anxiety disorder, unspecified: Secondary | ICD-10-CM

## 2022-08-01 DIAGNOSIS — Z923 Personal history of irradiation: Secondary | ICD-10-CM | POA: Diagnosis not present

## 2022-08-01 DIAGNOSIS — K5903 Drug induced constipation: Secondary | ICD-10-CM | POA: Diagnosis not present

## 2022-08-01 DIAGNOSIS — G20 Parkinson's disease: Secondary | ICD-10-CM | POA: Diagnosis not present

## 2022-08-01 DIAGNOSIS — Z515 Encounter for palliative care: Secondary | ICD-10-CM

## 2022-08-01 DIAGNOSIS — C7951 Secondary malignant neoplasm of bone: Secondary | ICD-10-CM | POA: Insufficient documentation

## 2022-08-01 DIAGNOSIS — Z79899 Other long term (current) drug therapy: Secondary | ICD-10-CM | POA: Diagnosis not present

## 2022-08-01 DIAGNOSIS — Z7189 Other specified counseling: Secondary | ICD-10-CM

## 2022-08-01 MED ORDER — POTASSIUM CHLORIDE CRYS ER 20 MEQ PO TBCR: 20.0000 meq | EXTENDED_RELEASE_TABLET | Freq: Every day | ORAL | 0 refills | Status: DC

## 2022-08-01 MED ORDER — DULOXETINE HCL 20 MG PO CPEP: 40.0000 mg | ORAL_CAPSULE | Freq: Every day | ORAL | 3 refills | Status: DC

## 2022-08-01 NOTE — Progress Notes (Unsigned)
[facility]  [phone]) [phone] Fax:([phone]) [phone]   Name: Alfio Loescher Date: 08/01/2022 MRN: [id number]  DOB: 28-Oct-1946  Patient Care Team: [facility] as PCP - General Pickenpack-Cousar, Carlena Sax, NP as Nurse Practitioner (Nurse Practitioner)     REASON FOR CONSULTATION: Frederick Ponce is a 76 y.o. male with medical history including Parkinson's disease and hyperlipidemia. 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 He is having urgent radiation appointment due to iliac bone pain. Palliative ask to see for symptom management.    SOCIAL HISTORY:     reports that he has never smoked. He has never used smokeless tobacco. He reports current alcohol use of about 14.0 standard drinks of alcohol per week. He reports that he does not use drugs.  ADVANCE DIRECTIVES:  Patient is scheduled to complete advanced directives at our clinic here at the cancer center on 02/16/2022. MOST form provided.  Patient plans to review and complete at a later time.   CODE STATUS:   PAST MEDICAL HISTORY: Past Medical History:  Diagnosis Date   Hyperlipidemia    Parkinson disease ([place])    Prostate cancer ([place])     PAST SURGICAL HISTORY: History reviewed. No pertinent surgical history.  HEMATOLOGY/ONCOLOGY HISTORY:  Oncology History  Prostate 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 ([place])  01/18/2022 Initial Diagnosis   Prostate 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 ([place])   02/20/2022 - 02/20/2022 Chemotherapy   Patient is on Treatment Plan : PROSTATE Docetaxel q21d     05/23/2022 Cancer Staging   Staging form: Prostate, AJCC 8th Edition - Clinical: Stage IVB (cTX, cNX, cM1c) - Signed by Wyatt Portela, MD on 05/23/2022     ALLERGIES:  is allergic to gabapentin and celebrex [celecoxib].  MEDICATIONS:  Current Outpatient Medications  Medication Sig  Dispense Refill   abiraterone acetate (ZYTIGA) 250 MG tablet Take 4 tablets (1,000 mg total) by mouth daily. Take on an empty stomach 1 hour before or 2 hours after a meal (Patient taking differently: Take 1,000 mg by mouth See admin instructions. Take 4 tablets (1000 mg) by mouth every morning at 4am (Take on an empty stomach 1 hour before or 2 hours after a meal)) 120 tablet 0   amLODipine (NORVASC) 5 MG tablet Take 1 tablet (5 mg total) by mouth daily. 30 tablet 0   atorvastatin (LIPITOR) 40 MG tablet Take 40 mg by mouth every morning.     calcium-vitamin D (OSCAL WITH D) 500-5 MG-MCG tablet Take 1 tablet by mouth 2 (two) times daily. 60 tablet 3   carbidopa-levodopa (SINEMET) 25-100 MG tablet Take 1 tablet by mouth 3 (three) times daily. At 7 AM, 11AM, and 3PM. (Patient taking differently: Take 1 tablet by mouth 3 (three) times daily.) 270 tablet 3   COENZYME Q10 PO Take 1 capsule by mouth every morning.     DULoxetine (CYMBALTA) 20 MG capsule Take 2 capsules (40 mg total) by mouth daily. 60 capsule 3   ibuprofen (ADVIL) 600 MG tablet Take 600 mg by mouth every 6 (six) hours as needed (pain).     KRILL OIL PO Take 1 capsule by mouth every morning.     morphine (MS CONTIN) 15 MG 12 hr tablet Take 1 tablet (15 mg total) by mouth daily. 7 tablet 0   morphine (MSIR) 15 MG tablet Take 1 tablet (15 mg total) by mouth every 4 (four)  hours as needed for severe pain. 30 tablet 0   multivitamin-lutein (OCUVITE-LUTEIN) CAPS capsule Take 1 capsule by mouth every morning.     oxyCODONE (OXY IR/ROXICODONE) 5 MG immediate release tablet Take 5 mg by mouth every 6 (six) hours as needed for severe pain or breakthrough pain.     potassium chloride SA (KLOR-CON M) 20 MEQ tablet Take 1 tablet (20 mEq total) by mouth daily. 14 tablet 0   predniSONE (DELTASONE) 5 MG tablet Take 5 mg by mouth daily with breakfast.     traMADol (ULTRAM) 50 MG tablet Take 50 mg by mouth every 6 (six) hours as needed (pain).     vitamin  B-12 (CYANOCOBALAMIN) 1000 MCG tablet Take 1,000 mcg by mouth every morning.     No current facility-administered medications for this visit.    PERFORMANCE STATUS (ECOG) : 1 - Symptomatic but completely ambulatory   IMPRESSION:  Mr. Ernster and his husband presented to clinic today for follow-up. No acute distress noted. Ambulatory without difficulty. Unfortunately he was recently hospitalized due to overdose attempt. He acknowledges his actions and does not identify at this time to have any suicidal ideations.   I had open and direct discussion with Mr. Bamba regarding recent events and use of pain medication. He shares that his pain was came on suddenly and was severe causing him to proceed with overdose. Shares he did not wish to live in misery and "figured" this could be his way out. Emotional support provided. His pain over the past several months has been intermittent in episodes and severity.   We discussed recent referral to [facility] on behalf of Dr. Alen Blew and Dr. Tammi Klippel. They have not heard anything from this referral. I advised I would follow-up and someone would reach out to them tomorrow with an updated.   We discussed his ongoing pain management needs. His MS Contin has now been decreased to once daily. We will increase his Cymbalta for additional support. For safety reasons he will only receive a week supply at a time. He understands if he gets into severe pain or loses control he will need to reach out to appropriate medical team members.   He is being actively followed by outpatient Psychiatry team virtually.   [name] shares if his pain continues or future efforts warrants no improvement he would wish to focus on his comfort vs suffer from all co-morbidities. He is clear at this time in expressed wishes his goal is to continue with ongoing support, aggressive interventions, allowing him every opportunity to continue to thrive.   We will continue to closely follow.    Constipation in the setting of MS Contin. Encouraged continued use of Miralax daily. He knows if no bowel movement within 48hrs he may increase to twice daily.    PLAN: MS Contin 15 mg daily. Will only receive week supply for safety.  Tylenol extra strength 3 times a day as needed Cymbalta 20 mg daily Prune juice, tea, MiraLAX daily for bowel regimen I have placed a call to [facility] to follow-up on sent referral.   We will plan to have phone follow-up in 1-2 weeks for symptom follow-up. I will see patient back in 6-8 weeks in collaboration to other oncology appointments.   Patient expressed understanding and was in agreement with this plan. He also understands that He can call the clinic at any time with any questions, concerns, or complaints.     Any controlled substances utilized were prescribed in the  context of palliative care. PDMP has been reviewed.    Time Total: 40 min   Visit consisted of counseling and education dealing with the complex and emotionally intense issues of symptom management and palliative care in the setting of serious and potentially life-threatening illness.Greater than 50%  of this time was spent counseling and coordinating care related to the above assessment and plan.  Alda Lea, AGPCNP-BC  Palliative Medicine Team/[facility]

## 2022-08-02 ENCOUNTER — Telehealth: Payer: Self-pay

## 2022-08-02 ENCOUNTER — Encounter: Payer: Self-pay | Admitting: Oncology

## 2022-08-02 NOTE — Telephone Encounter (Signed)
Called pt and confirmed that the [facility] called pt and left callback number. Pt states he will call tomorrow morning once they were open, no further needs at this time

## 2022-08-06 ENCOUNTER — Inpatient Hospital Stay (HOSPITAL_BASED_OUTPATIENT_CLINIC_OR_DEPARTMENT_OTHER): Payer: Medicare Other | Admitting: Oncology

## 2022-08-06 ENCOUNTER — Inpatient Hospital Stay: Payer: Medicare Other

## 2022-08-06 ENCOUNTER — Other Ambulatory Visit: Payer: Self-pay | Admitting: Oncology

## 2022-08-06 ENCOUNTER — Other Ambulatory Visit: Payer: Self-pay

## 2022-08-06 VITALS — BP 123/68 | HR 83 | Temp 97.3°F | Resp 18 | Wt 161.4 lb

## 2022-08-06 DIAGNOSIS — C7951 Secondary malignant neoplasm of bone: Secondary | ICD-10-CM

## 2022-08-06 DIAGNOSIS — Z515 Encounter for palliative care: Secondary | ICD-10-CM

## 2022-08-06 DIAGNOSIS — C61 Malignant neoplasm of prostate: Secondary | ICD-10-CM | POA: Diagnosis not present

## 2022-08-06 DIAGNOSIS — R530 Neoplastic (malignant) related fatigue: Secondary | ICD-10-CM

## 2022-08-06 LAB — CBC WITH DIFFERENTIAL (CANCER CENTER ONLY)
Abs Immature Granulocytes: 0.02 10*3/uL (ref 0.00–0.07)
Basophils Absolute: 0 10*3/uL (ref 0.0–0.1)
Basophils Relative: 0 %
Eosinophils Absolute: 0 10*3/uL (ref 0.0–0.5)
Eosinophils Relative: 1 %
HCT: 36.1 % — ABNORMAL LOW (ref 39.0–52.0)
Hemoglobin: 12.2 g/dL — ABNORMAL LOW (ref 13.0–17.0)
Immature Granulocytes: 0 %
Lymphocytes Relative: 10 %
Lymphs Abs: 0.7 10*3/uL (ref 0.7–4.0)
MCH: 34.1 pg — ABNORMAL HIGH (ref 26.0–34.0)
MCHC: 33.8 g/dL (ref 30.0–36.0)
MCV: 100.8 fL — ABNORMAL HIGH (ref 80.0–100.0)
Monocytes Absolute: 0.4 10*3/uL (ref 0.1–1.0)
Monocytes Relative: 5 %
Neutro Abs: 5.9 10*3/uL (ref 1.7–7.7)
Neutrophils Relative %: 84 %
Platelet Count: 238 10*3/uL (ref 150–400)
RBC: 3.58 MIL/uL — ABNORMAL LOW (ref 4.22–5.81)
RDW: 13.2 % (ref 11.5–15.5)
WBC Count: 7.1 10*3/uL (ref 4.0–10.5)
nRBC: 0 % (ref 0.0–0.2)

## 2022-08-06 LAB — CMP (CANCER CENTER ONLY)
ALT: 7 U/L (ref 0–44)
AST: 14 U/L — ABNORMAL LOW (ref 15–41)
Albumin: 4.3 g/dL (ref 3.5–5.0)
Alkaline Phosphatase: 52 U/L (ref 38–126)
Anion gap: 3 — ABNORMAL LOW (ref 5–15)
BUN: 18 mg/dL (ref 8–23)
CO2: 28 mmol/L (ref 22–32)
Calcium: 9 mg/dL (ref 8.9–10.3)
Chloride: 104 mmol/L (ref 98–111)
Creatinine: 0.75 mg/dL (ref 0.61–1.24)
GFR, Estimated: >60 mL/min
Glucose, Bld: 114 mg/dL — ABNORMAL HIGH (ref 70–99)
Potassium: 4.7 mmol/L (ref 3.5–5.1)
Sodium: 135 mmol/L (ref 135–145)
Total Bilirubin: 1.3 mg/dL — ABNORMAL HIGH (ref 0.3–1.2)
Total Protein: 6.5 g/dL (ref 6.5–8.1)

## 2022-08-06 LAB — IRON AND IRON BINDING CAPACITY (CC-WL,HP ONLY)
Iron: 128 ug/dL (ref 45–182)
Saturation Ratios: 44 % — ABNORMAL HIGH (ref 17.9–39.5)
TIBC: 288 ug/dL (ref 250–450)
UIBC: 160 ug/dL (ref 117–376)

## 2022-08-06 MED ORDER — LEUPROLIDE ACETATE (4 MONTH) 30 MG ~~LOC~~ KIT
30.0000 mg | PACK | Freq: Once | SUBCUTANEOUS | Status: AC
  Administered 2022-08-06: 30 mg via SUBCUTANEOUS
  Filled 2022-08-06: qty 30

## 2022-08-06 MED ORDER — DENOSUMAB 120 MG/1.7ML ~~LOC~~ SOLN
120.0000 mg | Freq: Once | SUBCUTANEOUS | Status: AC
  Administered 2022-08-06: 120 mg via SUBCUTANEOUS
  Filled 2022-08-06: qty 1.7

## 2022-08-06 NOTE — Progress Notes (Signed)
Lab orders placed.  

## 2022-08-06 NOTE — Patient Instructions (Signed)
Leuprolide Suspension for Injection (Prostate Cancer) What is this medication? LEUPROLIDE (loo PROE lide) reduces the symptoms of prostate cancer. It works by decreasing levels of the hormone testosterone in the body. This prevents prostate cancer cells from spreading or growing. This medicine may be used for other purposes; ask your health care provider or pharmacist if you have questions. COMMON BRAND NAME(S): Eligard, Fensolvi, Lupron Depot, Lupron Depot-Ped, Lutrate Depot, Viadur What should I tell my care team before I take this medication? They need to know if you have any of these conditions: Diabetes Heart disease Heart failure High or low levels of electrolytes, such as magnesium, potassium, or sodium in your blood Irregular heartbeat or rhythm Seizures An unusual or allergic reaction to leuprolide, other medications, foods, dyes, or preservatives Pregnant or trying to get pregnant Breast-feeding How should I use this medication? This medication is injected under the skin or into a muscle. It is given by your care team in a hospital or clinic setting. Talk to your care team about the use of this medication in children. Special care may be needed. Overdosage: If you think you have taken too much of this medicine contact a poison control center or emergency room at once. NOTE: This medicine is only for you. Do not share this medicine with others. What if I miss a dose? Keep appointments for follow-up doses. It is important not to miss your dose. Call your care team if you are unable to keep an appointment. What may interact with this medication? Do not take this medication with any of the following: Cisapride Dronedarone Ketoconazole Levoketoconazole Pimozide Thioridazine This medication may also interact with the following: Other medications that cause heart rhythm changes This list may not describe all possible interactions. Give your health care provider a list of all the  medicines, herbs, non-prescription drugs, or dietary supplements you use. Also tell them if you smoke, drink alcohol, or use illegal drugs. Some items may interact with your medicine. What should I watch for while using this medication? Visit your care team for regular checks on your progress. Tell your care team if your symptoms do not start to get better or if they get worse. This medication may increase blood sugar. The risk may be higher in patients who already have diabetes. Ask your care team what you can do to lower the risk of diabetes while taking this medication. This medication may cause infertility. Talk to your care team if you are concerned about your fertility. Heart attacks and strokes have been reported with the use of this medication. Get emergency help if you develop signs or symptoms of a heart attack or stroke. Talk to your care team about the risks and benefits of this medication. What side effects may I notice from receiving this medication? Side effects that you should report to your care team as soon as possible: Allergic reactions--skin rash, itching, hives, swelling of the face, lips, tongue, or throat Heart attack--pain or tightness in the chest, shoulders, arms, or jaw, nausea, shortness of breath, cold or clammy skin, feeling faint or lightheaded Heart rhythm changes--fast or irregular heartbeat, dizziness, feeling faint or lightheaded, chest pain, trouble breathing High blood sugar (hyperglycemia)--increased thirst or amount of urine, unusual weakness or fatigue, blurry vision Mood swings, irritability, hostility Seizures Stroke--sudden numbness or weakness of the face, arm, or leg, trouble speaking, confusion, trouble walking, loss of balance or coordination, dizziness, severe headache, change in vision Thoughts of suicide or self-harm, worsening mood, feelings of depression  Side effects that usually do not require medical attention (report to your care team if they  continue or are bothersome): Bone pain Change in sex drive or performance General discomfort and fatigue Hot flashes Muscle pain Pain, redness, or irritation at injection site Swelling of the ankles, hands, or feet This list may not describe all possible side effects. Call your doctor for medical advice about side effects. You may report side effects to FDA at 1-800-FDA-1088. Where should I keep my medication? This medication is given in a hospital or clinic. It will not be stored at home. NOTE: This sheet is a summary. It may not cover all possible information. If you have questions about this medicine, talk to your doctor, pharmacist, or health care provider.  Denosumab Injection (Oncology) What is this medication? DENOSUMAB (den oh SUE mab) prevents weakened bones caused by cancer. It may also be used to treat noncancerous bone tumors that cannot be removed by surgery. It can also be used to treat high calcium levels in the blood caused by cancer. It works by blocking a protein that causes bones to break down quickly. This slows down the release of calcium from bones, which lowers calcium levels in your blood. It also makes your bones stronger and less likely to break (fracture). This medicine may be used for other purposes; ask your health care provider or pharmacist if you have questions. COMMON BRAND NAME(S): XGEVA What should I tell my care team before I take this medication? They need to know if you have any of these conditions: Dental disease Having surgery or tooth extraction Infection Kidney disease Low levels of calcium or vitamin D in the blood Malnutrition On hemodialysis Skin conditions or sensitivity Thyroid or parathyroid disease An unusual reaction to denosumab, other medications, foods, dyes, or preservatives Pregnant or trying to get pregnant Breast-feeding How should I use this medication? This medication is for injection under the skin. It is given by your care  team in a hospital or clinic setting. A special MedGuide will be given to you before each treatment. Be sure to read this information carefully each time. Talk to your care team about the use of this medication in children. While it may be prescribed for children as young as 13 years for selected conditions, precautions do apply. Overdosage: If you think you have taken too much of this medicine contact a poison control center or emergency room at once. NOTE: This medicine is only for you. Do not share this medicine with others. What if I miss a dose? Keep appointments for follow-up doses. It is important not to miss your dose. Call your care team if you are unable to keep an appointment. What may interact with this medication? Do not take this medication with any of the following: Other medications containing denosumab This medication may also interact with the following: Medications that lower your chance of fighting infection Steroid medications, such as prednisone or cortisone This list may not describe all possible interactions. Give your health care provider a list of all the medicines, herbs, non-prescription drugs, or dietary supplements you use. Also tell them if you smoke, drink alcohol, or use illegal drugs. Some items may interact with your medicine. What should I watch for while using this medication? Your condition will be monitored carefully while you are receiving this medication. You may need blood work while taking this medication. This medication may increase your risk of getting an infection. Call your care team for advice if you get  a fever, chills, sore throat, or other symptoms of a cold or flu. Do not treat yourself. Try to avoid being around people who are sick. You should make sure you get enough calcium and vitamin D while you are taking this medication, unless your care team tells you not to. Discuss the foods you eat and the vitamins you take with your care team. Some  people who take this medication have severe bone, joint, or muscle pain. This medication may also increase your risk for jaw problems or a broken thigh bone. Tell your care team right away if you have severe pain in your jaw, bones, joints, or muscles. Tell your care team if you have any pain that does not go away or that gets worse. Talk to your care team if you may be pregnant. Serious birth defects can occur if you take this medication during pregnancy and for 5 months after the last dose. You will need a negative pregnancy test before starting this medication. Contraception is recommended while taking this medication and for 5 months after the last dose. Your care team can help you find the option that works for you. What side effects may I notice from receiving this medication? Side effects that you should report to your care team as soon as possible: Allergic reactions--skin rash, itching, hives, swelling of the face, lips, tongue, or throat Bone, joint, or muscle pain Low calcium level--muscle pain or cramps, confusion, tingling, or numbness in the hands or feet Osteonecrosis of the jaw--pain, swelling, or redness in the mouth, numbness of the jaw, poor healing after dental work, unusual discharge from the mouth, visible bones in the mouth Side effects that usually do not require medical attention (report to your care team if they continue or are bothersome): Cough Diarrhea Fatigue Headache Nausea This list may not describe all possible side effects. Call your doctor for medical advice about side effects. You may report side effects to FDA at 1-800-FDA-1088. Where should I keep my medication? This medication is given in a hospital or clinic. It will not be stored at home. NOTE: This sheet is a summary. It may not cover all possible information. If you have questions about this medicine, talk to your doctor, pharmacist, or health care provider.  2023 Elsevier/Gold Standard (2022-04-30  00:00:00)   2023 Elsevier/Gold Standard (2022-02-19 00:00:00)

## 2022-08-06 NOTE — Progress Notes (Signed)
Hematology and Oncology Follow Up Visit  Frederick Ponce [id number] January 11, 1946 76 y.o. 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  He has castration-sensitive after presenting with PSA of 22.5.   Prior Therapy: CT-guided biopsy of a pulmonary nodule obtained on January 15, 2022 which confirmed the presence of prostate cancer.  He is status post radiation therapy to the left sacroiliac pelvic bones completed in February 2023.  He received 30 Gray in 10 fractions  Firmagon 240 mg started on January 23, 2022.  Last injection was given on February 20, 2022.  Current therapy:  Eligard 30 mg every 4 months started on March 20, 2022.  Next injection will be given today.  Zytiga 1000 mg daily with prednisone 5 mg daily started on March 06, 2022.  Xgeva 120 mg every 2 months.  He will receive his next injection today.  Interim History: Frederick Ponce presents today for a follow-up.  Since last visit, he was hospitalized at the end of July 2023 for respiratory failure after potential suicide attempt due to intractable pain.  He did not require psychiatric hospitalization.  Since his discharge, he continues to be on morphine for long-acting and short acting purposes.  He continues to have this pain intermittently is predominantly on the left side of his hip associated with neuropathic component that shoots down his leg.  He reports he is not feeling at today and it flares up his pain is rather severe and does not respond he has established care at the pain clinic last week.    Medications: Reviewed without changes. Current Outpatient Medications  Medication Sig Dispense Refill   abiraterone acetate (ZYTIGA) 250 MG tablet Take 4 tablets (1,000 mg total) by mouth daily. Take on an empty stomach 1 hour before or 2 hours after a meal  (Patient taking differently: Take 1,000 mg by mouth See admin instructions. Take 4 tablets (1000 mg) by mouth every morning at 4am (Take on an empty stomach 1 hour before or 2 hours after a meal)) 120 tablet 0   amLODipine (NORVASC) 5 MG tablet Take 1 tablet (5 mg total) by mouth daily. 30 tablet 0   atorvastatin (LIPITOR) 40 MG tablet Take 40 mg by mouth every morning.     calcium-vitamin D (OSCAL WITH D) 500-5 MG-MCG tablet Take 1 tablet by mouth 2 (two) times daily. 60 tablet 3   carbidopa-levodopa (SINEMET) 25-100 MG tablet Take 1 tablet by mouth 3 (three) times daily. At 7 AM, 11AM, and 3PM. (Patient taking differently: Take 1 tablet by mouth 3 (three) times daily.) 270 tablet 3   COENZYME Q10 PO Take 1 capsule by mouth every morning.     DULoxetine (CYMBALTA) 20 MG capsule Take 2 capsules (40 mg total) by mouth daily. 60 capsule 3   ibuprofen (ADVIL) 600 MG tablet Take 600 mg by mouth every 6 (six) hours as needed (pain).     KRILL OIL PO Take 1 capsule by mouth every morning.     morphine (MS CONTIN) 15 MG 12 hr tablet Take 1 tablet (15 mg total) by mouth daily. 7 tablet 0   morphine (MSIR) 15 MG tablet Take 1 tablet (15 mg total) by mouth every 4 (four) hours as needed for severe pain. 30 tablet 0   multivitamin-lutein (OCUVITE-LUTEIN) CAPS capsule Take 1 capsule by mouth every morning.  oxyCODONE (OXY IR/ROXICODONE) 5 MG immediate release tablet Take 5 mg by mouth every 6 (six) hours as needed for severe pain or breakthrough pain.     potassium chloride SA (KLOR-CON M) 20 MEQ tablet Take 1 tablet (20 mEq total) by mouth daily. 14 tablet 0   predniSONE (DELTASONE) 5 MG tablet Take 5 mg by mouth daily with breakfast.     traMADol (ULTRAM) 50 MG tablet Take 50 mg by mouth every 6 (six) hours as needed (pain).     vitamin B-12 (CYANOCOBALAMIN) 1000 MCG tablet Take 1,000 mcg by mouth every morning.     No current facility-administered medications for this visit.     Allergies:   Allergies  Allergen Reactions   Gabapentin Other (See Comments)    Restless legs/confusion   Celebrex [Celecoxib] Rash      Physical Exam:   Blood pressure 123/68, pulse 83, temperature (!) 97.3 F (36.3 C), resp. rate 18, weight 161 lb 6.4 oz (73.2 kg), SpO2 97 %.   ECOG: 1    General appearance: Alert, awake without any distress. Head: Atraumatic without abnormalities Oropharynx: Without any thrush or ulcers. Eyes: No scleral icterus. Lymph nodes: No lymphadenopathy noted in the cervical, supraclavicular, or axillary nodes Heart:regular rate and rhythm, without any murmurs or gallops.   Lung: Clear to auscultation without any rhonchi, wheezes or dullness to percussion. Abdomin: Soft, nontender without any shifting dullness or ascites. Musculoskeletal: No clubbing or cyanosis. Neurological: No motor or sensory deficits. Skin: No rashes or lesions. Psychiatric: Mood and affect appears appropriate and slightly flat.     Lab Results: Lab Results  Component Value Date   WBC 6.5 07/26/2022   HGB 13.2 07/26/2022   HCT 39.3 07/26/2022   MCV 99.7 07/26/2022   PLT 247 07/26/2022     Chemistry      Component Value Date/Time   NA 139 07/26/2022 0534   NA 140 09/14/2021 0920   K 3.2 (L) 07/26/2022 0534   CL 107 07/26/2022 0534   CO2 21 (L) 07/26/2022 0534   BUN 12 07/26/2022 0534   BUN 18 09/14/2021 0920   CREATININE 0.69 07/26/2022 0534   CREATININE 0.76 07/18/2022 1101      Component Value Date/Time   CALCIUM 8.1 (L) 07/26/2022 0534   ALKPHOS 54 07/22/2022 0617   AST 17 07/22/2022 0617   AST 14 (L) 07/18/2022 1101   ALT 16 07/22/2022 0617   ALT 7 07/18/2022 1101   BILITOT 0.9 07/22/2022 0617   BILITOT 1.7 (H) 07/18/2022 1101        Impression and Plan:  76 year old with:  1.  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     His disease status was updated at this time and  treatment choices were reviewed.  His PSA continues to be undetectable based on last testing on May 23, 2022.  Alternative treatment options including Taxotere chemotherapy will be only used if he has relapsed disease I recommended updating his staging scans with PSMA PET given his recent symptoms of increased pain to rule out any area of metastasis.  He is agreeable and we will arrange for that in the near future.     2.  Bone directed therapy: Risks and benefits of continuing Xgeva were discussed at this time.  Complications that include osteonecrosis of the jaw and hypocalcemia were reiterated.  He is agreeable to proceed.     3.  Parkinson's disease: No  recent exacerbation noted with symptoms are managed.  4.  Androgen deprivation therapy: I recommended continuing Eligard indefinitely.  He will receive injection today and repeated in 4 months.  Complication clinic weight gain hot flashes among others were reviewed.   5.  Left-sided pelvic pain: Unclear etiology at this time.  Despite a very low PSA as well as morphine use she continues to have intermittent pain.  He is following with pain clinic and we will update his staging scan to rule out specific area of metastasis that could be treated locally.   6.  Prognosis and goals of care: He understands he has an incurable malignancy but his disease is under excellent control.  He still would prefer to be on hospice rather than to deal with the severity of this pain which has been problematic to his quality of life.  We will continue to address these issues with him.   7.  Follow-up: In 2 months for a follow-up visit.       30  minutes were dedicated to this visit.  The time spent on reviewing laboratory data, disease status update and outlining future plan of care discussion.   Zola Button, MD 8/14/202310:31 AM

## 2022-08-07 ENCOUNTER — Other Ambulatory Visit (HOSPITAL_COMMUNITY): Payer: Self-pay

## 2022-08-07 LAB — PROSTATE-SPECIFIC AG, SERUM (LABCORP): Prostate Specific Ag, Serum: 0.2 ng/mL (ref 0.0–4.0)

## 2022-08-08 ENCOUNTER — Encounter: Payer: Self-pay | Admitting: Urology

## 2022-08-08 ENCOUNTER — Other Ambulatory Visit: Payer: Self-pay

## 2022-08-08 ENCOUNTER — Telehealth: Payer: Self-pay

## 2022-08-08 MED ORDER — MORPHINE SULFATE ER 15 MG PO TBCR
15.0000 mg | EXTENDED_RELEASE_TABLET | Freq: Every day | ORAL | 0 refills | Status: DC
Start: 2022-08-08 — End: 2022-08-14

## 2022-08-08 NOTE — Addendum Note (Signed)
Encounter addended by: Freeman Caldron, PA-C on: 08/08/2022 6:51 PM  Actions taken: Clinical Note Signed

## 2022-08-08 NOTE — Telephone Encounter (Signed)
Pt called for a refill, request sent to NP

## 2022-08-08 NOTE — Telephone Encounter (Signed)
Voicemail left in reference to patient's 08/09/22 telephone appointment w/ Ashlyn Bruning PA-C. I left my extension [phone] and requested that patient return my call, so that I may complete the nursing portion of patient's telephone appointment.  

## 2022-08-08 NOTE — Progress Notes (Signed)
Telephone appointment. I spoke w/ patient's spouse [name], verified his identity and began nursing interview. He reports that patient is having continued LT leg pain 10/10, extreme high stress levels 10/10, and thoughts/ actions of suicide due to pain. Patient attempted an intentional narcotic overdose on 07/21/22 but is doing okay post his release from the ED at [facility]. No other issues reported at this time. I made sure patient had the number to the suicide self help line "988" and understood the importance of seeking help if needed.  I reminded spouse of patient's 9:30am-08/09/22 telephone appointment w/ Ashlyn Bruning PA-C. I left my extension [phone] in case patient needs anything. Spouse verbalized understanding.  Patient contact [phone] or [phone] Frederick Ponce)

## 2022-08-09 ENCOUNTER — Ambulatory Visit
Admission: RE | Admit: 2022-08-09 | Discharge: 2022-08-09 | Disposition: A | Discharge status: Home / Self Care | Payer: Medicare Other | Source: Ambulatory Visit | Attending: Urology | Admitting: Urology

## 2022-08-09 DIAGNOSIS — C61 Malignant neoplasm of prostate: Secondary | ICD-10-CM

## 2022-08-09 NOTE — Progress Notes (Signed)
  Radiation Oncology         [phone]) [phone] ________________________________  Name: Frederick Ponce MRN: [id number]  Date: 06/22/2022  DOB: 07/28/1946  End of Treatment Note  Diagnosis:    76 y.o. 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  Plan: The patient has completed radiation treatment. The patient will return to radiation oncology clinic for routine followup in one month. I advised him to call or return sooner if he has any questions or concerns related to his recovery or treatment. ________________________________  Sheral Apley. Tammi Klippel, M.D.

## 2022-08-09 NOTE — Progress Notes (Signed)
Radiation Oncology         [phone]) [phone] ________________________________  Name: Frederick Ponce MRN: [id number]  Date: 08/09/2022  DOB: 20-Mar-1946  Post Treatment Note  CC: Center, [facility]  Diagnosis:   76 y.o. 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  Narrative: I spoke with the patient to conduct his routine scheduled 1 month follow up visit via telephone to spare the patient unnecessary potential exposure in the healthcare setting during the current COVID-19 pandemic.  The patient was notified in advance and gave permission to proceed with this visit format.  He tolerated radiation treatment relatively well without any ill side effects.                              On review of systems, the patient states that he has not had much, if any, pain relief since completing the recent treatments. The pain remains intermittent and most severe in the left lower extremity. He has been working with palliative care for pain management with limited success with pain medications. He has met with Dr. Wynetta Emery at the [facility] and has a follow up visit scheduled with her 08/10/22 after she's had a chance to review the most recent MRI T&L spine from 06/05/22. He is hopeful that she may have something to offer because otherwise, he is ready to transition to Hospice care as he can no longer tolerate this level of pain when it happens. Some days are better than others. The pain became so severe a few weeks ago that he attempted overdose. He is getting psychological help now as well. His PSA has remained well controlled but was low detectable at 0.2 on most recent  labs 08/06/22 as compared to prior undetectable level in 06/2022. An order for repeat PSMA PET has been placed but not yet approved by insurance for scheduling.  ALLERGIES:  is allergic to gabapentin and celebrex [celecoxib].  Meds: Current Outpatient Medications  Medication Sig Dispense Refill   abiraterone acetate (ZYTIGA) 250 MG tablet Take 4 tablets (1,000 mg total) by mouth daily. Take on an empty stomach 1 hour before or 2 hours after a meal (Patient taking differently: Take 1,000 mg by mouth See admin instructions. Take 4 tablets (1000 mg) by mouth every morning at 4am (Take on an empty stomach 1 hour before or 2 hours after a meal)) 120 tablet 0   amLODipine (NORVASC) 5 MG tablet Take 1 tablet (5 mg total) by mouth daily. 30 tablet 0   atorvastatin (LIPITOR) 40 MG tablet Take 40 mg by mouth every morning.     calcium-vitamin D (OSCAL WITH D) 500-5 MG-MCG tablet Take 1 tablet by mouth 2 (two) times daily. 60 tablet 3   carbidopa-levodopa (SINEMET) 25-100 MG tablet Take 1 tablet by mouth 3 (three) times daily. At 7 AM, 11AM, and 3PM. (Patient taking differently: Take 1 tablet by mouth 3 (three) times daily.) 270 tablet 3   COENZYME Q10 PO Take 1 capsule by mouth every morning.     DULoxetine (CYMBALTA) 20 MG  capsule Take 2 capsules (40 mg total) by mouth daily. 60 capsule 3   ibuprofen (ADVIL) 600 MG tablet Take 600 mg by mouth every 6 (six) hours as needed (pain).     KRILL OIL PO Take 1 capsule by mouth every morning.     morphine (MS CONTIN) 15 MG 12 hr tablet Take 1 tablet (15 mg total) by mouth daily. 7 tablet 0   morphine (MSIR) 15 MG tablet Take 1 tablet (15 mg total) by mouth every 4 (four) hours as needed for severe pain. 30 tablet 0   multivitamin-lutein (OCUVITE-LUTEIN) CAPS capsule Take 1 capsule by mouth every morning.     oxyCODONE (OXY IR/ROXICODONE) 5 MG immediate release tablet Take 5 mg by mouth every 6 (six) hours as needed for severe pain or breakthrough pain.      potassium chloride SA (KLOR-CON M) 20 MEQ tablet Take 1 tablet (20 mEq total) by mouth daily. 14 tablet 0   predniSONE (DELTASONE) 5 MG tablet Take 5 mg by mouth daily with breakfast.     traMADol (ULTRAM) 50 MG tablet Take 50 mg by mouth every 6 (six) hours as needed (pain).     vitamin B-12 (CYANOCOBALAMIN) 1000 MCG tablet Take 1,000 mcg by mouth every morning.     No current facility-administered medications for this encounter.    Physical Findings:  vitals were not taken for this visit.  Pain Assessment Pain Score: 10-Worst pain ever (LT leg)/10 Unable to assess due to telephone follow up visit format.  Lab Findings: Lab Results  Component Value Date   WBC 7.1 08/06/2022   HGB 12.2 (L) 08/06/2022   HCT 36.1 (L) 08/06/2022   MCV 100.8 (H) 08/06/2022   PLT 238 08/06/2022     Radiographic Findings: CT Angio Chest PE W and/or Wo Contrast  Result Date: 07/22/2022 CLINICAL DATA:  Pulmonary embolus suspected with high probability. History of prostate cancer with lung metastasis. EXAM: CT ANGIOGRAPHY CHEST WITH CONTRAST TECHNIQUE: Multidetector CT imaging of the chest was performed using the standard protocol during bolus administration of intravenous contrast. Multiplanar CT image reconstructions and MIPs were obtained to evaluate the vascular anatomy. RADIATION DOSE REDUCTION: This exam was performed according to the departmental dose-optimization program which includes automated exposure control, adjustment of the mA and/or kV according to patient size and/or use of iterative reconstruction technique. CONTRAST:  119m OMNIPAQUE IOHEXOL 350 MG/ML SOLN COMPARISON:  07/18/2022 FINDINGS: Cardiovascular: Motion artifact limits the examination. There is good opacification of the central and segmental pulmonary arteries. No focal filling defects. No evidence of significant pulmonary embolus. Cardiac enlargement. No pericardial effusions. Normal caliber thoracic aorta. No aortic dissection. Aortic  and coronary artery calcifications. Mediastinum/Nodes: Thyroid gland is unremarkable. No significant lymphadenopathy. Esophagus is decompressed. Lungs/Pleura: Motion artifact limits visualization of the lungs. Solid pulmonary nodule seen previously are mostly obscured today. Subpleural changes in the posterior lungs is likely dependent atelectasis. No pleural effusions. No pneumothorax. Airways are patent. Upper Abdomen: No acute abnormalities demonstrated. Musculoskeletal: Focal areas of sclerosis in the thoracic vertebra and in the posterior left seventh rib consistent with known metastatic disease. Review of the MIP images confirms the above findings. IMPRESSION: 1. No evidence of significant pulmonary embolus. 2. Dependent atelectasis in the lungs. Motion artifact limits visualization. Known pulmonary nodules are not well demonstrated on today's study. 3. Vertebral and rib sclerosis consistent with known metastatic disease. No change. Electronically Signed   By: WLucienne CapersM.D.   On: 07/22/2022 00:21   CT  Head Wo Contrast  Result Date: 07/21/2022 CLINICAL DATA:  Drug overdose with mental status changes. EXAM: CT HEAD WITHOUT CONTRAST TECHNIQUE: Contiguous axial images were obtained from the base of the skull through the vertex without intravenous contrast. RADIATION DOSE REDUCTION: This exam was performed according to the departmental dose-optimization program which includes automated exposure control, adjustment of the mA and/or kV according to patient size and/or use of iterative reconstruction technique. COMPARISON:  None Available. FINDINGS: Brain: There is mild cerebral atrophy with mild atrophic ventriculomegaly and moderate to severe small vessel disease of the cerebral white matter. There is slight cerebellar atrophy. No focal asymmetry is seen concerning for acute cortical based infarct, hemorrhage or mass. There is no midline shift. Basal cisterns are clear. Vascular: Mild carotid  atherosclerosis. No hyperdense central vessel is seen. Scattered calcification distal vertebral arteries. Skull: No fracture or focal lesion. Sinuses/Orbits: Unremarkable orbital contents. There is moderate membrane thickening in the left maxillary sinus. Other visible sinuses, bilateral mastoid air cells are clear. There is a reverse S shaped nasal septum with right-sided spurring. Other: None. IMPRESSION: 1. No acute intracranial CT findings. 2. Atrophy and small-vessel disease. 3. Sinus disease. Electronically Signed   By: Telford Nab M.D.   On: 07/21/2022 23:21   DG Chest Portable 1 View  Result Date: 07/21/2022 CLINICAL DATA:  Hypoxia and drug overdose EXAM: PORTABLE CHEST 1 VIEW COMPARISON:  07/18/2022 FINDINGS: Cardiac shadow is enlarged but stable. Aortic calcifications are seen. The lungs are hypoinflated but clear. No acute bony abnormality is noted. Known sclerotic metastatic disease is not well appreciated on this exam. IMPRESSION: No acute abnormality noted. Electronically Signed   By: Inez Catalina M.D.   On: 07/21/2022 22:02   CT CHEST ABDOMEN PELVIS W CONTRAST  Result Date: 07/18/2022 CLINICAL DATA:  Prostate cancer, assess treatment response; * Tracking Code: BO * EXAM: CT CHEST, ABDOMEN, AND PELVIS WITH CONTRAST TECHNIQUE: Multidetector CT imaging of the chest, abdomen and pelvis was performed following the standard protocol during bolus administration of intravenous contrast. RADIATION DOSE REDUCTION: This exam was performed according to the departmental dose-optimization program which includes automated exposure control, adjustment of the mA and/or kV according to patient size and/or use of iterative reconstruction technique. CONTRAST:  1104m OMNIPAQUE IOHEXOL 300 MG/ML  SOLN COMPARISON:  CT chest, abdomen and pelvis dated January 05, 2022 FINDINGS: CT CHEST FINDINGS Cardiovascular: Normal heart size. No pericardial effusion. Atherosclerotic disease of the thoracic aorta. No suspicious  filling defects of the main pulmonary arteries. Moderate coronary artery calcifications of the LAD. Mediastinum/Nodes: Esophagus and thyroid are unremarkable. Interval interval decreased size of right hilar lymph node measuring 0.6 cm, previously 1.2 cm. No pathologically enlarged lymph nodes seen in the chest. Lungs/Pleura: Central airways are patent. No consolidation, pleural effusion or pneumothorax. Bilateral solid pulmonary nodules are markedly decreased in size or no longer present. Reference solid pulmonary nodule of the left lower lobe measuring 0.5 cm on series 4 image 97, previously measured up to 1.8 cm. Reference nodule of the right upper lobe measuring 3 mm on series 4, image 45, previously measured 10 mm. Musculoskeletal: Increased sclerosis of the left posterior 7th rib. CT ABDOMEN PELVIS FINDINGS Hepatobiliary: Peripherally enhancing lesion of the right hepatic dome measuring 1.8 cm on series 2, image 50, similar to prior exam and likely a hemangioma. No suspicious liver lesions. Gallbladder is unremarkable. No biliary ductal dilation. Pancreas: Unremarkable. No pancreatic ductal dilatation or surrounding inflammatory changes. Spleen: Normal in size without focal abnormality. Adrenals/Urinary  Tract: Bilateral adrenal glands are unremarkable. 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 Unchanged bilateral renal stones. No left hydronephrosis. No suspicious renal lesions. Stomach/Bowel: Stomach is within normal limits. Appendix appears normal. No evidence of bowel wall thickening, distention, or inflammatory changes. Vascular/Lymphatic: Aortic atherosclerosis. No enlarged abdominal or pelvic lymph nodes. Reproductive: Prostate is decreased in size. Other: Moderate size right inguinal hernia containing a nondilated loop of sigmoid colon. No abdominopelvic ascites. Musculoskeletal: Sclerosis of the left iliac bone and  sacrum, increased when compared with prior exam. IMPRESSION: 1. Bilateral solid pulmonary nodules are markedly decreased in size or no longer present. 2. Decreased right hilar adenopathy. 3. Prostate is decreased in size. 4. Increased sclerosis of the posterior left seventh rib, left iliac bone and sacrum, likely due to treatment response. Electronically Signed   By: Yetta Glassman M.D.   On: 07/18/2022 19:39    Impression/Plan: 1. 76 y.o. 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 He appears to have recovered from the effects of his recent palliative radiation. Unfortunately, he has not had much, if any, pain relief with this course of treatment, unlike his first course. He continues with intermittently severe pain in the LLE and is being followed with palliative care and the Pain Clinic for pain management. He has also continued on Eligard q 4 months, Zytiga daily and Xgeva q 2 months, under the care and direction of Dr. Alen Blew. Hopefully, he will have his PSMA PET scan soon, just awaiting insurance approval at present, and if there are any active sites of metastasis that could be contributing to his current pain, we would be more than happy to continue to participate in his care with further palliative radiation. Otherwise, we will plan to see him back on an as needed basis.    Nicholos Johns, PA-C

## 2022-08-12 ENCOUNTER — Other Ambulatory Visit: Payer: Self-pay | Admitting: Nurse Practitioner

## 2022-08-13 ENCOUNTER — Telehealth: Payer: Self-pay | Admitting: Nurse Practitioner

## 2022-08-13 NOTE — Telephone Encounter (Signed)
Scheduled per 8/21 in basket, message has been left  

## 2022-08-14 ENCOUNTER — Other Ambulatory Visit: Payer: Self-pay | Admitting: Nurse Practitioner

## 2022-08-14 ENCOUNTER — Telehealth: Payer: Self-pay | Admitting: Neurology

## 2022-08-14 ENCOUNTER — Telehealth: Payer: Self-pay

## 2022-08-14 MED ORDER — MORPHINE SULFATE ER 30 MG PO TBCR: 30.0000 mg | EXTENDED_RELEASE_TABLET | Freq: Every day | ORAL | 0 refills | Status: DC

## 2022-08-14 NOTE — Telephone Encounter (Signed)
Pt is needing a refill request for his carbidopa-levodopa (SINEMET) 25-100 MG tablet sent in to the [facility] on [address]. Pt states he only has enough for two more days.

## 2022-08-14 NOTE — Telephone Encounter (Signed)
Pt returning phone call. Pt asking for call as soon as possible.

## 2022-08-14 NOTE — Telephone Encounter (Signed)
Disp Refills Start End  carbidopa-levodopa (SINEMET) 25-100 MG tablet 270 tablet 3 06/11/2022   Sig - Route: Take 1 tablet by mouth 3 (three) times daily. At 7 AM, 11AM, and 3PM. - Oral  Patient taking differently: Take 1 tablet by mouth 3 (three) times daily.      Sent to pharmacy as: carbidopa-levodopa (SINEMET) 25-100 MG tablet  E-Prescribing Status: Receipt confirmed by pharmacy (06/11/2022  5:24 PM EDT)   [facility] [id number] - [place], [place]  Additional Information

## 2022-08-14 NOTE — Telephone Encounter (Signed)
Pt called for morphine refill, see new orders. Dose increased d/t pt increase in pain. This RN tried to call pt back, no answer, let detailed VM and callback number,

## 2022-08-14 NOTE — Telephone Encounter (Addendum)
I called pt and LMVM for him that a years prescription was done 06-11-2022 for sinemet.  Confirmed received.  Recommend to call pharmacy ( he may be working on a previous rx refill #).  He is to call back if questions.

## 2022-08-14 NOTE — Telephone Encounter (Signed)
Spoke with patient and advised he was likely requesting refill from previous prescription. Advised that new prescription for quantity of 270 was sent to pharmacy on 06/11/22. I called [facility] and confirmed. They will fill this for the patient now. Patient very appreciative.

## 2022-08-15 ENCOUNTER — Encounter: Payer: Self-pay | Admitting: Oncology

## 2022-08-15 ENCOUNTER — Other Ambulatory Visit: Payer: Self-pay | Admitting: Nurse Practitioner

## 2022-08-15 ENCOUNTER — Telehealth: Payer: Self-pay

## 2022-08-15 ENCOUNTER — Telehealth: Payer: Self-pay | Admitting: Oncology

## 2022-08-15 DIAGNOSIS — Z515 Encounter for palliative care: Secondary | ICD-10-CM

## 2022-08-15 DIAGNOSIS — C61 Malignant neoplasm of prostate: Secondary | ICD-10-CM

## 2022-08-15 DIAGNOSIS — G893 Neoplasm related pain (acute) (chronic): Secondary | ICD-10-CM

## 2022-08-15 MED ORDER — MORPHINE SULFATE ER 30 MG PO TBCR: 30.0000 mg | EXTENDED_RELEASE_TABLET | Freq: Two times a day (BID) | ORAL | 0 refills | Status: DC

## 2022-08-15 NOTE — Progress Notes (Signed)
Erroneuous

## 2022-08-15 NOTE — Telephone Encounter (Signed)
Called patient regarding upcoming August and October appointments, left a voicemail.

## 2022-08-15 NOTE — Telephone Encounter (Signed)
Pt called to request a refill of his ms contin '15mg'$ , wishes to take it 2x a day instead of '30mg'$  1x a day. Script sent to pharmacy. Pt also mentioned needed to change upcoming schedule, scheduling message sent. Attempted to call pt back to notify of new script, no answer LVM.

## 2022-08-16 ENCOUNTER — Encounter (HOSPITAL_COMMUNITY)
Admission: RE | Admit: 2022-08-16 | Discharge: 2022-08-16 | Disposition: A | Discharge status: Home / Self Care | Payer: Medicare Other | Source: Ambulatory Visit | Attending: Oncology | Admitting: Oncology

## 2022-08-16 ENCOUNTER — Other Ambulatory Visit: Payer: Self-pay | Admitting: Nurse Practitioner

## 2022-08-16 DIAGNOSIS — C61 Malignant neoplasm of prostate: Secondary | ICD-10-CM | POA: Insufficient documentation

## 2022-08-16 DIAGNOSIS — C7951 Secondary malignant neoplasm of bone: Secondary | ICD-10-CM

## 2022-08-16 DIAGNOSIS — G893 Neoplasm related pain (acute) (chronic): Secondary | ICD-10-CM

## 2022-08-16 DIAGNOSIS — Z515 Encounter for palliative care: Secondary | ICD-10-CM

## 2022-08-16 MED ORDER — PIFLIFOLASTAT F 18 (PYLARIFY) INJECTION
9.0000 | Freq: Once | INTRAVENOUS | Status: AC
  Administered 2022-08-16: 8.9 via INTRAVENOUS

## 2022-08-16 MED ORDER — MORPHINE SULFATE ER 15 MG PO TBCR: 15.0000 mg | EXTENDED_RELEASE_TABLET | Freq: Two times a day (BID) | ORAL | 0 refills | Status: DC

## 2022-08-16 NOTE — Progress Notes (Signed)
RN left message notify patient that CT department has confirmed they can make imaging disc after his PSMA PET scan today.    Contact information left for call back.

## 2022-08-20 ENCOUNTER — Telehealth: Payer: Self-pay | Admitting: Neurology

## 2022-08-20 ENCOUNTER — Other Ambulatory Visit: Payer: Self-pay | Admitting: Oncology

## 2022-08-20 ENCOUNTER — Other Ambulatory Visit (HOSPITAL_COMMUNITY): Payer: Self-pay

## 2022-08-20 DIAGNOSIS — C61 Malignant neoplasm of prostate: Secondary | ICD-10-CM

## 2022-08-20 NOTE — Telephone Encounter (Signed)
Pt is asking for a call to discuss Waking up violently shaking sometimes it is from waist up sometimes it is is entire body.  Pt states he just woke up from a nap shaking.  Pt states he is unsure if it is from a seizure or from him Parkinson's . Pt states the shaking is in arms and legs and this has been going on for 3 weeks. They last for 1-2 mins, pt has not been to ED or Urgent care because of the episodes only lasting 1-2 mins.  Please call

## 2022-08-21 ENCOUNTER — Encounter: Payer: Self-pay | Admitting: Oncology

## 2022-08-21 ENCOUNTER — Other Ambulatory Visit (HOSPITAL_COMMUNITY): Payer: Self-pay

## 2022-08-21 ENCOUNTER — Telehealth: Payer: Self-pay

## 2022-08-21 MED ORDER — ABIRATERONE ACETATE 250 MG PO TABS
1000.0000 mg | ORAL_TABLET | Freq: Every day | ORAL | 0 refills | Status: DC
  Filled 2022-08-21: qty 120, 30d supply, fill #0

## 2022-08-21 NOTE — Telephone Encounter (Signed)
Returned patient's phone call regarding wanting to speak to Frederick Salmon, NP. Patient spoke with Frederick Baton, NP regarding pain management and upcoming epidural procedure. Patient was advised that morphine will be refilled. Patient appreciated the call back and had no further concerns or questions after phone call.

## 2022-08-21 NOTE — Telephone Encounter (Signed)
LMVM for pt that returned call.  °

## 2022-08-21 NOTE — Telephone Encounter (Signed)
Pt returned call.  Unfortunately I was rooming a pt and could not get the phone call at the time.  He waited until I was finished. He stated for about the last 3-4 wks he has had episodes of being woken out of a dead sleep with violet shaking of whole body lasting 2-3 monutues.  He is aware of this going on, and he cannot stop it. Had 2 episodes the other day (1135 and then 0405).   He is taking sinemet 25/'100mg'$  1 TID last visit.  ? If this issue.  He also has had suicide attempt 07-21-2022 in [place], due to L hip pain (severe) work up being done.  I relayed that will ask Dr. Rexene Alberts , attempted to make appt 08-28-2022 0745, he could not do this.  See NP?

## 2022-08-22 NOTE — Telephone Encounter (Signed)
Parkinson's disease does not cause seizures. Patient's partner may have to call 911 if he has seizure like events. I also recommend he see his PCP ASAP for any underlying medical cause. Tremors may be more pronounced in the hands and legs first thing in the morning or upon awakening at night for some PD patients.  However, patients are typically not woken up from sleep with violent shaking, this may indicate something altogether different.  Therefore, I strongly recommend he see his PCP to be checked out immediately.  Certain medications can lower seizure threshold.  For example, tramadol can rarely cause seizures, oxycodone and related narcotic pain medications can rarely cause seizures as well.  Dehydration may lower seizure threshold.  Please call patient or partner, [name] (on [place]) back and relay these recommendations.

## 2022-08-22 NOTE — Telephone Encounter (Signed)
I called pt and he was coming out of his pcp visit which was good.  I relayed the message from Dr. Rexene Alberts and he will go to ED if he has episode like this again.  I relayed that PD does not cause seizures. Certain medications can lower seizure threshold. Seeing his pcp this am good thing.  Will go to ED or 911 when has another episode.   He verbalized he understood.

## 2022-08-23 ENCOUNTER — Inpatient Hospital Stay: Payer: Medicare Other | Admitting: Nurse Practitioner

## 2022-08-24 ENCOUNTER — Other Ambulatory Visit (HOSPITAL_COMMUNITY): Payer: Self-pay

## 2022-08-24 ENCOUNTER — Other Ambulatory Visit: Payer: Self-pay | Admitting: Nurse Practitioner

## 2022-08-24 ENCOUNTER — Other Ambulatory Visit: Payer: Self-pay | Admitting: Radiation Oncology

## 2022-08-24 ENCOUNTER — Encounter: Payer: Self-pay | Admitting: Radiation Oncology

## 2022-08-24 DIAGNOSIS — Z515 Encounter for palliative care: Secondary | ICD-10-CM

## 2022-08-24 DIAGNOSIS — G893 Neoplasm related pain (acute) (chronic): Secondary | ICD-10-CM

## 2022-08-24 DIAGNOSIS — C7951 Secondary malignant neoplasm of bone: Secondary | ICD-10-CM

## 2022-08-24 MED ORDER — MORPHINE SULFATE ER 15 MG PO TBCR: 15.0000 mg | EXTENDED_RELEASE_TABLET | Freq: Two times a day (BID) | ORAL | 0 refills | Status: DC

## 2022-08-24 NOTE — Progress Notes (Signed)
  Radiation Oncology         [phone]) [phone] ________________________________  Name: Frederick Ponce MRN: [id number]  Date: 08/24/2022  DOB: [birth year]  Chart Note:  I got a call from Dr. Johnny Bridge from Pain Management, requesting we review his most recent PET.  It shows a left pubis/acetabular metastasis causing pain.  I called the patient and offered palliative radiotherapy to this site, and will see him next Wednesday.    He also reported that his weekly morphine Rx had not been sent to [facility] today according to his established plan with palliative.  Since it was after 5 pm on a Friday I sent in a refill for this, given the upcoming 3-day weekend.     Radiographic Findings: NM PET (PSMA) SKULL TO MID THIGH  Result Date: 08/18/2022 CLINICAL DATA:  Metastatic prostate carcinoma with biochemical recurrence. EXAM: NUCLEAR MEDICINE PET SKULL BASE TO THIGH TECHNIQUE: 8.9 mCi F18 Piflufolastat (Pylarify) was injected intravenously. Full-ring PET imaging was performed from the skull base to thigh after the radiotracer. CT data was obtained and used for attenuation correction and anatomic localization. COMPARISON:  CT on 07/18/2022 FINDINGS: NECK No radiotracer activity in neck lymph nodes. Incidental CT finding: None. CHEST No radiotracer accumulation within mediastinal or hilar lymph nodes. 3 mm pulmonary nodule in the anterior left upper lobe on image 25/7 shows radiotracer uptake, with SUV max of 2.2. An 8 mm pulmonary nodule in the posterior left lower lobe on image 40/7 also shows radiotracer uptake, with SUV max of 4.7. No other suspicious pulmonary nodules are identified. Incidental CT finding: None. ABDOMEN/PELVIS Prostate: No focal activity in the prostate. Lymph nodes: No abnormal radiotracer accumulation within pelvic or abdominal nodes. Liver: No evidence of liver metastasis. Incidental CT finding: Small lesion previously characterized as a benign hemangioma is again seen in the  posterior right hepatic lobe but shows no radiotracer uptake. Bilateral renal calculi are again seen with stable right renal pelvicaliectasis. No evidence of ureteral calculi. Stable moderate right inguinal hernia containing sigmoid colon. SKELETON Radiotracer uptake is seen at site of multiple sclerotic bone metastases in the pubic bones and ischial tuberosities bilaterally. Other sclerotic bone metastases in the left ilium, thoracic and lumbar spine, and left ribs show no radiotracer uptake. IMPRESSION: Tiny sub-cm pulmonary metastases in left upper and lower lobes. Radiotracer uptake at site of multiple sclerotic bone metastases in the pelvis. Other sclerotic bone metastases show no radiotracer uptake. No evidence of soft tissue metastatic disease within the abdomen or pelvis. Electronically Signed   By: Marlaine Hind M.D.   On: 08/18/2022 10:43     ________________________________  Sheral Apley Tammi Klippel, M.D.

## 2022-08-28 ENCOUNTER — Inpatient Hospital Stay: Payer: Medicare Other | Admitting: Nurse Practitioner

## 2022-08-29 ENCOUNTER — Emergency Department (HOSPITAL_COMMUNITY)
Admission: EM | Admit: 2022-08-29 | Discharge: 2022-08-29 | Disposition: A | Discharge status: Home / Self Care | Payer: Medicare Other | Attending: Emergency Medicine | Admitting: Emergency Medicine

## 2022-08-29 ENCOUNTER — Ambulatory Visit: Payer: Medicare Other

## 2022-08-29 ENCOUNTER — Other Ambulatory Visit: Payer: Self-pay

## 2022-08-29 ENCOUNTER — Emergency Department (HOSPITAL_COMMUNITY): Payer: Medicare Other

## 2022-08-29 ENCOUNTER — Encounter (HOSPITAL_COMMUNITY): Payer: Self-pay

## 2022-08-29 ENCOUNTER — Ambulatory Visit: Payer: Medicare Other | Admitting: Radiation Oncology

## 2022-08-29 DIAGNOSIS — R791 Abnormal coagulation profile: Secondary | ICD-10-CM | POA: Diagnosis not present

## 2022-08-29 DIAGNOSIS — R509 Fever, unspecified: Secondary | ICD-10-CM | POA: Diagnosis present

## 2022-08-29 DIAGNOSIS — U071 COVID-19: Secondary | ICD-10-CM | POA: Insufficient documentation

## 2022-08-29 DIAGNOSIS — Z79899 Other long term (current) drug therapy: Secondary | ICD-10-CM | POA: Diagnosis not present

## 2022-08-29 LAB — CBC WITH DIFFERENTIAL/PLATELET
Abs Immature Granulocytes: 0.01 10*3/uL (ref 0.00–0.07)
Basophils Absolute: 0 10*3/uL (ref 0.0–0.1)
Basophils Relative: 0 %
Eosinophils Absolute: 0.1 10*3/uL (ref 0.0–0.5)
Eosinophils Relative: 2 %
HCT: 34.4 % — ABNORMAL LOW (ref 39.0–52.0)
Hemoglobin: 11.3 g/dL — ABNORMAL LOW (ref 13.0–17.0)
Immature Granulocytes: 0 %
Lymphocytes Relative: 13 %
Lymphs Abs: 0.6 10*3/uL — ABNORMAL LOW (ref 0.7–4.0)
MCH: 33.9 pg (ref 26.0–34.0)
MCHC: 32.8 g/dL (ref 30.0–36.0)
MCV: 103.3 fL — ABNORMAL HIGH (ref 80.0–100.0)
Monocytes Absolute: 0.4 10*3/uL (ref 0.1–1.0)
Monocytes Relative: 9 %
Neutro Abs: 3.5 10*3/uL (ref 1.7–7.7)
Neutrophils Relative %: 76 %
Platelets: 183 10*3/uL (ref 150–400)
RBC: 3.33 MIL/uL — ABNORMAL LOW (ref 4.22–5.81)
RDW: 13.2 % (ref 11.5–15.5)
WBC: 4.6 10*3/uL (ref 4.0–10.5)
nRBC: 0 % (ref 0.0–0.2)

## 2022-08-29 LAB — COMPREHENSIVE METABOLIC PANEL
ALT: 25 U/L (ref 0–44)
AST: 28 U/L (ref 15–41)
Albumin: 3.8 g/dL (ref 3.5–5.0)
Alkaline Phosphatase: 44 U/L (ref 38–126)
Anion gap: 5 (ref 5–15)
BUN: 17 mg/dL (ref 8–23)
CO2: 24 mmol/L (ref 22–32)
Calcium: 8.3 mg/dL — ABNORMAL LOW (ref 8.9–10.3)
Chloride: 109 mmol/L (ref 98–111)
Creatinine, Ser: 0.64 mg/dL (ref 0.61–1.24)
GFR, Estimated: >60 mL/min (ref >60)
Glucose, Bld: 115 mg/dL — ABNORMAL HIGH (ref 70–99)
Potassium: 3.8 mmol/L (ref 3.5–5.1)
Sodium: 138 mmol/L (ref 135–145)
Total Bilirubin: 1.2 mg/dL (ref 0.3–1.2)
Total Protein: 6.5 g/dL (ref 6.5–8.1)

## 2022-08-29 LAB — RESP PANEL BY RT-PCR (FLU A&B, COVID) ARPGX2
Influenza A by PCR: NEGATIVE
Influenza B by PCR: NEGATIVE
SARS Coronavirus 2 by RT PCR: POSITIVE — AB

## 2022-08-29 LAB — PROTIME-INR
INR: 1 (ref 0.8–1.2)
Prothrombin Time: 12.8 seconds (ref 11.4–15.2)

## 2022-08-29 LAB — LACTIC ACID, PLASMA: Lactic Acid, Venous: 1.2 mmol/L (ref 0.5–1.9)

## 2022-08-29 MED ORDER — MOLNUPIRAVIR EUA 200MG CAPSULE: 4.0000 | ORAL_CAPSULE | Freq: Two times a day (BID) | ORAL | 0 refills | Status: AC

## 2022-08-29 MED ORDER — ACETAMINOPHEN 325 MG PO TABS
650.0000 mg | ORAL_TABLET | Freq: Once | ORAL | Status: AC | PRN
  Administered 2022-08-29: 650 mg via ORAL
  Filled 2022-08-29: qty 2

## 2022-08-29 NOTE — ED Triage Notes (Signed)
Pt woke up to take 3am meds and had bodyaches, headache, cough, runny nose and temp 102F.  No meds PTA.

## 2022-08-29 NOTE — ED Provider Notes (Signed)
[facility] DEPT Provider Note   CSN: [id number] Arrival date & time: 08/29/22  0353     History  Chief Complaint  Patient presents with   Fever    Frederick Ponce is a 76 y.o. male.  The history is provided by the patient.  Fever Max temp prior to arrival:  102 Temp source:  Oral Severity:  Moderate Onset quality:  Gradual Duration:  3 hours Timing:  Constant Progression:  Unchanged Chronicity:  New Relieved by:  Nothing Worsened by:  Nothing Ineffective treatments:  None tried Associated symptoms: congestion, cough and rhinorrhea   Associated symptoms: no confusion, no nausea, no rash and no vomiting   Risk factors: no sick contacts   Symptoms x 3 hours.       Home Medications Prior to Admission medications   Medication Sig Start Date End Date Taking? Authorizing Provider  abiraterone acetate (ZYTIGA) 250 MG tablet Take 4 tablets (1,000 mg total) by mouth daily. Take on an empty stomach 1 hour before or 2 hours after a meal 08/21/22  Yes Shadad, Mathis Dad, MD  amLODipine (NORVASC) 5 MG tablet Take 1 tablet (5 mg total) by mouth daily. 07/26/22  Yes Amin, Ankit Chirag, MD  atorvastatin (LIPITOR) 40 MG tablet Take 40 mg by mouth every morning.   Yes [provider]  calcium-vitamin D (OSCAL WITH D) 500-5 MG-MCG tablet Take 1 tablet by mouth 2 (two) times daily. 03/13/22  Yes Wyatt Portela, MD  carbidopa-levodopa (SINEMET) 25-100 MG tablet Take 1 tablet by mouth 3 (three) times daily. At 7 AM, 11AM, and 3PM. Patient taking differently: Take 1 tablet by mouth 3 (three) times daily. 06/11/22  Yes Star Age, MD  COENZYME Q10 PO Take 1 capsule by mouth every morning.   Yes [provider]  ibuprofen (ADVIL) 600 MG tablet Take 600 mg by mouth every 6 (six) hours as needed (pain).   Yes [provider]  KLOR-CON M20 20 MEQ tablet TAKE 1 TABLET BY MOUTH DAILY 08/24/22  Yes Pickenpack-Cousar, Athena N, NP  KRILL OIL PO  Take 1 capsule by mouth every morning.   Yes [provider]  molnupiravir EUA (LAGEVRIO) 200 mg CAPS capsule Take 4 capsules (800 mg total) by mouth 2 (two) times daily for 5 days. 08/29/22 09/03/22 Yes Nava Song, MD  morphine (MS CONTIN) 15 MG 12 hr tablet Take 1 tablet (15 mg total) by mouth every 12 (twelve) hours. 08/24/22  Yes Tyler Pita, MD  multivitamin-lutein [facility]) CAPS capsule Take 1 capsule by mouth every morning.   Yes [provider]  predniSONE (DELTASONE) 5 MG tablet Take 5 mg by mouth daily with breakfast.   Yes [provider]  traMADol (ULTRAM) 50 MG tablet Take 50 mg by mouth every 6 (six) hours as needed (pain).   Yes [provider]  vitamin B-12 (CYANOCOBALAMIN) 1000 MCG tablet Take 1,000 mcg by mouth every morning.   Yes [provider]  DULoxetine (CYMBALTA) 20 MG capsule Take 2 capsules (40 mg total) by mouth daily. Patient not taking: Reported on 08/29/2022 08/01/22   Pickenpack-Cousar, Carlena Sax, NP      Allergies    Gabapentin and Celebrex [celecoxib]    Review of Systems   Review of Systems  Constitutional:  Positive for fever.  HENT:  Positive for congestion and rhinorrhea.   Eyes:  Negative for photophobia.  Respiratory:  Positive for cough.   Gastrointestinal:  Negative for nausea and vomiting.  Skin:  Negative for rash.  Neurological:  Negative for facial asymmetry.  Psychiatric/Behavioral:  Negative for confusion.   All other systems reviewed and are negative.   Physical Exam Updated Vital Signs BP 128/69   Pulse 88   Temp (!) 102 F (38.9 C) (Oral)   Resp (!) 21   Ht '5\' 8"'$  (1.727 m)   Wt 73 kg   SpO2 95%   BMI 24.47 kg/m  Physical Exam Vitals and nursing note reviewed.  Constitutional:      General: He is not in acute distress.    Appearance: He is well-developed. He is not diaphoretic.  HENT:     Head: Normocephalic and atraumatic.     Nose: Congestion present.  Eyes:      Conjunctiva/sclera: Conjunctivae normal.     Pupils: Pupils are equal, round, and reactive to light.  Cardiovascular:     Rate and Rhythm: Normal rate and regular rhythm.     Pulses: Normal pulses.     Heart sounds: Normal heart sounds.  Pulmonary:     Effort: Pulmonary effort is normal.     Breath sounds: Normal breath sounds. No wheezing or rales.  Abdominal:     General: Bowel sounds are normal.     Palpations: Abdomen is soft.     Tenderness: There is no abdominal tenderness. There is no guarding or rebound.  Musculoskeletal:        General: Normal range of motion.     Cervical back: Normal range of motion and neck supple.  Skin:    General: Skin is warm and dry.     Capillary Refill: Capillary refill takes less than 2 seconds.  Neurological:     General: No focal deficit present.     Mental Status: He is alert and oriented to person, place, and time.  Psychiatric:        Mood and Affect: Mood normal.        Behavior: Behavior normal.     ED Results / Procedures / Treatments   Labs (all labs ordered are listed, but only abnormal results are displayed) Results for orders placed or performed during the hospital encounter of 08/29/22  Resp Panel by RT-PCR (Flu A&B, Covid) Anterior Nasal Swab   Specimen: Anterior Nasal Swab  Result Value Ref Range   SARS Coronavirus 2 by RT PCR POSITIVE (A) NEGATIVE   Influenza A by PCR NEGATIVE NEGATIVE   Influenza B by PCR NEGATIVE NEGATIVE  Comprehensive metabolic panel  Result Value Ref Range   Sodium 138 135 - 145 mmol/L   Potassium 3.8 3.5 - 5.1 mmol/L   Chloride 109 98 - 111 mmol/L   CO2 24 22 - 32 mmol/L   Glucose, Bld 115 (H) 70 - 99 mg/dL   BUN 17 8 - 23 mg/dL   Creatinine, Ser 0.64 0.61 - 1.24 mg/dL   Calcium 8.3 (L) 8.9 - 10.3 mg/dL   Total Protein 6.5 6.5 - 8.1 g/dL   Albumin 3.8 3.5 - 5.0 g/dL   AST 28 15 - 41 U/L   ALT 25 0 - 44 U/L   Alkaline Phosphatase 44 38 - 126 U/L   Total Bilirubin 1.2 0.3 - 1.2 mg/dL   GFR,  Estimated >60 >60 mL/min   Anion gap 5 5 - 15  Lactic acid, plasma  Result Value Ref Range   Lactic Acid, Venous 1.2 0.5 - 1.9 mmol/L  CBC with Differential  Result Value Ref Range   WBC 4.6  4.0 - 10.5 K/uL   RBC 3.33 (L) 4.22 - 5.81 MIL/uL   Hemoglobin 11.3 (L) 13.0 - 17.0 g/dL   HCT 34.4 (L) 39.0 - 52.0 %   MCV 103.3 (H) 80.0 - 100.0 fL   MCH 33.9 26.0 - 34.0 pg   MCHC 32.8 30.0 - 36.0 g/dL   RDW 13.2 11.5 - 15.5 %   Platelets 183 150 - 400 K/uL   nRBC 0.0 0.0 - 0.2 %   Neutrophils Relative % 76 %   Neutro Abs 3.5 1.7 - 7.7 K/uL   Lymphocytes Relative 13 %   Lymphs Abs 0.6 (L) 0.7 - 4.0 K/uL   Monocytes Relative 9 %   Monocytes Absolute 0.4 0.1 - 1.0 K/uL   Eosinophils Relative 2 %   Eosinophils Absolute 0.1 0.0 - 0.5 K/uL   Basophils Relative 0 %   Basophils Absolute 0.0 0.0 - 0.1 K/uL   Immature Granulocytes 0 %   Abs Immature Granulocytes 0.01 0.00 - 0.07 K/uL  Protime-INR  Result Value Ref Range   Prothrombin Time 12.8 11.4 - 15.2 seconds   INR 1.0 0.8 - 1.2   DG Chest 2 View  Result Date: 08/29/2022 CLINICAL DATA:  Fever with suspected sepsis. EXAM: CHEST - 2 VIEW COMPARISON:  Portable chest 07/21/2022 FINDINGS: The heart is slightly enlarged. Vascular markings are normal caliber and there is no edema or focal lung infiltrate. The lungs are generally clear. No pleural effusion is seen. There is aortic atherosclerosis with stable mediastinum. There is multilevel thoracic spine bridging enthesopathy, mild osteopenia. IMPRESSION: No active cardiopulmonary disease. Stable chest with slight cardiomegaly. Electronically Signed   By: Telford Nab M.D.   On: 08/29/2022 05:05   NM PET (PSMA) SKULL TO MID THIGH  Result Date: 08/18/2022 CLINICAL DATA:  Metastatic prostate carcinoma with biochemical recurrence. EXAM: NUCLEAR MEDICINE PET SKULL BASE TO THIGH TECHNIQUE: 8.9 mCi F18 Piflufolastat (Pylarify) was injected intravenously. Full-ring PET imaging was performed from the  skull base to thigh after the radiotracer. CT data was obtained and used for attenuation correction and anatomic localization. COMPARISON:  CT on 07/18/2022 FINDINGS: NECK No radiotracer activity in neck lymph nodes. Incidental CT finding: None. CHEST No radiotracer accumulation within mediastinal or hilar lymph nodes. 3 mm pulmonary nodule in the anterior left upper lobe on image 25/7 shows radiotracer uptake, with SUV max of 2.2. An 8 mm pulmonary nodule in the posterior left lower lobe on image 40/7 also shows radiotracer uptake, with SUV max of 4.7. No other suspicious pulmonary nodules are identified. Incidental CT finding: None. ABDOMEN/PELVIS Prostate: No focal activity in the prostate. Lymph nodes: No abnormal radiotracer accumulation within pelvic or abdominal nodes. Liver: No evidence of liver metastasis. Incidental CT finding: Small lesion previously characterized as a benign hemangioma is again seen in the posterior right hepatic lobe but shows no radiotracer uptake. Bilateral renal calculi are again seen with stable right renal pelvicaliectasis. No evidence of ureteral calculi. Stable moderate right inguinal hernia containing sigmoid colon. SKELETON Radiotracer uptake is seen at site of multiple sclerotic bone metastases in the pubic bones and ischial tuberosities bilaterally. Other sclerotic bone metastases in the left ilium, thoracic and lumbar spine, and left ribs show no radiotracer uptake. IMPRESSION: Tiny sub-cm pulmonary metastases in left upper and lower lobes. Radiotracer uptake at site of multiple sclerotic bone metastases in the pelvis. Other sclerotic bone metastases show no radiotracer uptake. No evidence of soft tissue metastatic disease within the abdomen or pelvis. Electronically Signed  By: Marlaine Hind M.D.   On: 08/18/2022 10:43     EKG None  Radiology DG Chest 2 View  Result Date: 08/29/2022 CLINICAL DATA:  Fever with suspected sepsis. EXAM: CHEST - 2 VIEW COMPARISON:   Portable chest 07/21/2022 FINDINGS: The heart is slightly enlarged. Vascular markings are normal caliber and there is no edema or focal lung infiltrate. The lungs are generally clear. No pleural effusion is seen. There is aortic atherosclerosis with stable mediastinum. There is multilevel thoracic spine bridging enthesopathy, mild osteopenia. IMPRESSION: No active cardiopulmonary disease. Stable chest with slight cardiomegaly. Electronically Signed   By: Telford Nab M.D.   On: 08/29/2022 05:05    Procedures Procedures    Medications Ordered in ED Medications  acetaminophen (TYLENOL) tablet 650 mg (650 mg Oral Given 08/29/22 0449)    ED Course/ Medical Decision Making/ A&P                           Medical Decision Making 3 hours of fever and congestion and rhinorrhea   Amount and/or Complexity of Data Reviewed Independent Historian: friend    Details: See above  External Data Reviewed: notes.    Details: Previous notes reviewed  Labs: ordered.    Details: All labs reviewed:  covid is positive.  Normal white count 4.6 and hemoglobin low 11.3  and platelet count normal.  Normal sodium and potassium and creatinine .63.  Normal LFTs. Normal lactate.     Radiology: ordered and independent interpretation performed.    Details: Negative CXR by me   Risk OTC drugs. Risk Details: Well appearing.  Normal oxygen saturation.  Well appearing.  Alternate tylenol and ibuprofen if not allergic for fevers.  Molupiravir ordered for outpatient.  Stable for discharge.  Strict return    Final Clinical Impression(s) / ED Diagnoses Final diagnoses:  [id number]   Return for intractable cough, coughing up blood, fevers > 100.4 unrelieved by medication, shortness of breath, intractable vomiting, chest pain, shortness of breath, weakness, numbness, changes in speech, facial asymmetry, abdominal pain, passing out, Inability to tolerate liquids or food, cough, altered mental status or any concerns. No signs  of systemic illness or infection. The patient is nontoxic-appearing on exam and vital signs are within normal limits.  I have reviewed the triage vital signs and the nursing notes. Pertinent labs & imaging results that were available during my care of the patient were reviewed by me and considered in my medical decision making (see chart for details). After history, exam, and medical workup I feel the patient has been appropriately medically screened and is safe for discharge home. Pertinent diagnoses were discussed with the patient. Patient was given return precautions.  Rx / DC Orders ED Discharge Orders          Ordered    molnupiravir EUA (LAGEVRIO) 200 mg CAPS capsule  2 times daily        08/29/22 0543              Ticara Waner, MD 08/29/22 6503

## 2022-08-29 NOTE — ED Notes (Signed)
Pt transport to XR 

## 2022-08-31 ENCOUNTER — Telehealth: Payer: Self-pay

## 2022-08-31 NOTE — Telephone Encounter (Signed)
Pt called this morning and spoke with with RN, pt reported at he was COVID+, which has pushed back his radiation appointments, which pt reports has frustrated him. Pt was given emotional support throughout the phone call, pt states he is safe at home, denies SI or HI, reports [name] is home with him and helps with medication management as well as emotional support. Pt reports pain is well managed and COVID symptoms are managed. pt appreciative of this time, verbalizes understanding to call with any questions or concerns.

## 2022-09-02 NOTE — Progress Notes (Signed)
  Radiation Oncology         [phone] ________________________________  Name: Eoghan Belcher MRN: [id number]  Date: 09/03/2022  DOB: 1946/02/14  SIMULATION AND TREATMENT PLANNING NOTE    ICD-10-CM   1. Prostate 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 ([place])  C61    C79.51     2. 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 patient with painful left pubis/acetabular metastasis from prostate cancer  NARRATIVE:  The patient was brought to the [facility].  Identity was confirmed.  All relevant records and images related to the planned course of therapy were reviewed.  The patient freely provided informed written consent to proceed with treatment after reviewing the details related to the planned course of therapy. The consent form was witnessed and verified by the simulation staff.  Then, the patient was set-up in a stable reproducible  supine position for radiation therapy.  CT images were obtained.  Surface markings were placed.  The CT images were loaded into the planning software.  Then the target and avoidance structures were contoured.  Treatment planning then occurred.  The radiation prescription was entered and confirmed.  Then, I designed and supervised the construction of a total of 3 medically necessary complex treatment devices consisting of leg positioner and MLC apertures to cover the treated hip area.  I have requested : 3D Simulation  I have requested a DVH of the following structures: Rectum, Bladder, femoral heads and target.  PLAN:  The patient will receive 30 Gy in 10 fractions.  ________________________________  Sheral Apley Tammi Klippel, M.D.

## 2022-09-03 ENCOUNTER — Other Ambulatory Visit: Payer: Self-pay

## 2022-09-03 ENCOUNTER — Ambulatory Visit
Admission: RE | Admit: 2022-09-03 | Discharge: 2022-09-03 | Disposition: A | Discharge status: Home / Self Care | Payer: Medicare Other | Source: Ambulatory Visit | Attending: Radiation Oncology | Admitting: Radiation Oncology

## 2022-09-03 DIAGNOSIS — C7951 Secondary malignant neoplasm of bone: Secondary | ICD-10-CM

## 2022-09-03 DIAGNOSIS — C7800 Secondary malignant neoplasm of unspecified lung: Secondary | ICD-10-CM | POA: Diagnosis not present

## 2022-09-03 DIAGNOSIS — Z510 Encounter for antineoplastic radiation therapy: Secondary | ICD-10-CM | POA: Diagnosis present

## 2022-09-03 DIAGNOSIS — C61 Malignant neoplasm of prostate: Secondary | ICD-10-CM | POA: Insufficient documentation

## 2022-09-03 DIAGNOSIS — Z79899 Other long term (current) drug therapy: Secondary | ICD-10-CM | POA: Insufficient documentation

## 2022-09-03 DIAGNOSIS — M898X8 Other specified disorders of bone, other site: Secondary | ICD-10-CM

## 2022-09-03 DIAGNOSIS — G893 Neoplasm related pain (acute) (chronic): Secondary | ICD-10-CM

## 2022-09-03 DIAGNOSIS — G20 Parkinson's disease: Secondary | ICD-10-CM | POA: Insufficient documentation

## 2022-09-03 DIAGNOSIS — N390 Urinary tract infection, site not specified: Secondary | ICD-10-CM | POA: Diagnosis not present

## 2022-09-03 DIAGNOSIS — Z515 Encounter for palliative care: Secondary | ICD-10-CM

## 2022-09-03 LAB — CULTURE, BLOOD (ROUTINE X 2): Culture: NO GROWTH

## 2022-09-03 MED ORDER — MORPHINE SULFATE ER 15 MG PO TBCR: 15.0000 mg | EXTENDED_RELEASE_TABLET | Freq: Two times a day (BID) | ORAL | 0 refills | Status: DC

## 2022-09-03 NOTE — Telephone Encounter (Signed)
Pt called and LVM for a refill on MS Contin, see new orders, pt call returned

## 2022-09-04 ENCOUNTER — Encounter: Payer: Self-pay | Admitting: General Practice

## 2022-09-04 DIAGNOSIS — Z510 Encounter for antineoplastic radiation therapy: Secondary | ICD-10-CM | POA: Diagnosis not present

## 2022-09-04 NOTE — Progress Notes (Signed)
[facility] Spiritual Care Note  Referred by nursing for additional layer of emotional support and opportunity to process health changes. Reached Frederick Ponce by phone and set up appointment in [place] office for Thursday 9/21 at [place], [place], [facility] Pager [phone] Voicemail [phone]

## 2022-09-05 ENCOUNTER — Other Ambulatory Visit: Payer: Self-pay

## 2022-09-05 ENCOUNTER — Ambulatory Visit
Admission: RE | Admit: 2022-09-05 | Discharge: 2022-09-05 | Disposition: A | Discharge status: Home / Self Care | Payer: Medicare Other | Source: Ambulatory Visit | Attending: Radiation Oncology | Admitting: Radiation Oncology

## 2022-09-05 DIAGNOSIS — T1491XA Suicide attempt, initial encounter: Secondary | ICD-10-CM

## 2022-09-05 DIAGNOSIS — Z515 Encounter for palliative care: Secondary | ICD-10-CM

## 2022-09-05 DIAGNOSIS — C61 Malignant neoplasm of prostate: Secondary | ICD-10-CM

## 2022-09-05 DIAGNOSIS — Z510 Encounter for antineoplastic radiation therapy: Secondary | ICD-10-CM | POA: Diagnosis not present

## 2022-09-05 DIAGNOSIS — F419 Anxiety disorder, unspecified: Secondary | ICD-10-CM

## 2022-09-05 LAB — RAD ONC ARIA SESSION SUMMARY
Course Elapsed Days: 0
Plan Fractions Treated to Date: 1
Plan Prescribed Dose Per Fraction: 3 Gy
Plan Total Fractions Prescribed: 10
Plan Total Prescribed Dose: 30 Gy
Reference Point Dosage Given to Date: 3 Gy
Reference Point Session Dosage Given: 3 Gy
Session Number: 1

## 2022-09-05 NOTE — Progress Notes (Signed)
Referral made to [facility], see new orders.

## 2022-09-06 ENCOUNTER — Ambulatory Visit
Admission: RE | Admit: 2022-09-06 | Discharge: 2022-09-06 | Disposition: A | Discharge status: Home / Self Care | Payer: Medicare Other | Source: Ambulatory Visit | Attending: Radiation Oncology | Admitting: Radiation Oncology

## 2022-09-06 ENCOUNTER — Other Ambulatory Visit: Payer: Self-pay

## 2022-09-06 DIAGNOSIS — Z510 Encounter for antineoplastic radiation therapy: Secondary | ICD-10-CM | POA: Diagnosis not present

## 2022-09-06 LAB — RAD ONC ARIA SESSION SUMMARY
Course Elapsed Days: 1
Plan Fractions Treated to Date: 2
Plan Prescribed Dose Per Fraction: 3 Gy
Plan Total Fractions Prescribed: 10
Plan Total Prescribed Dose: 30 Gy
Reference Point Dosage Given to Date: 6 Gy
Reference Point Session Dosage Given: 3 Gy
Session Number: 2

## 2022-09-07 ENCOUNTER — Ambulatory Visit
Admission: RE | Admit: 2022-09-07 | Discharge: 2022-09-07 | Disposition: A | Discharge status: Home / Self Care | Payer: Medicare Other | Source: Ambulatory Visit | Attending: Radiation Oncology | Admitting: Radiation Oncology

## 2022-09-07 ENCOUNTER — Other Ambulatory Visit: Payer: Self-pay

## 2022-09-07 DIAGNOSIS — Z510 Encounter for antineoplastic radiation therapy: Secondary | ICD-10-CM | POA: Diagnosis not present

## 2022-09-07 LAB — RAD ONC ARIA SESSION SUMMARY
Course Elapsed Days: 2
Plan Fractions Treated to Date: 3
Plan Prescribed Dose Per Fraction: 3 Gy
Plan Total Fractions Prescribed: 10
Plan Total Prescribed Dose: 30 Gy
Reference Point Dosage Given to Date: 9 Gy
Reference Point Session Dosage Given: 3 Gy
Session Number: 3

## 2022-09-10 ENCOUNTER — Other Ambulatory Visit (HOSPITAL_COMMUNITY): Payer: Self-pay

## 2022-09-10 ENCOUNTER — Other Ambulatory Visit: Payer: Self-pay

## 2022-09-10 ENCOUNTER — Other Ambulatory Visit: Payer: Self-pay | Admitting: Neurology

## 2022-09-10 ENCOUNTER — Ambulatory Visit
Admission: RE | Admit: 2022-09-10 | Discharge: 2022-09-10 | Disposition: A | Discharge status: Home / Self Care | Payer: Medicare Other | Source: Ambulatory Visit | Attending: Radiation Oncology | Admitting: Radiation Oncology

## 2022-09-10 DIAGNOSIS — Z510 Encounter for antineoplastic radiation therapy: Secondary | ICD-10-CM | POA: Diagnosis not present

## 2022-09-10 LAB — RAD ONC ARIA SESSION SUMMARY
Course Elapsed Days: 5
Plan Fractions Treated to Date: 4
Plan Prescribed Dose Per Fraction: 3 Gy
Plan Total Fractions Prescribed: 10
Plan Total Prescribed Dose: 30 Gy
Reference Point Dosage Given to Date: 12 Gy
Reference Point Session Dosage Given: 3 Gy
Session Number: 4

## 2022-09-10 IMAGING — MR MR LUMBAR SPINE WO/W CM
4 of 8 series · 16 of 48 positions shown · IV contrast (20 MH)
Comparison: CT chest/abdomen/pelvis 01/05/2022 lumbar spine MRI
12/07/2021

CLINICAL DATA: Acute back pain radiating to left leg, metastatic
prostate cancer

EXAM:
MRI THORACIC AND LUMBAR SPINE WITHOUT AND WITH CONTRAST
TECHNIQUE: Multiplanar and multiecho pulse sequences of the thoracic and lumbar
spine were obtained without and with intravenous contrast.
CONTRAST:  8mL GADAVIST GADOBUTROL 1 MMOL/ML IV SOLN

[Series 11: T2 · sagittal · 4.0mm · 0.55mm/px · 3 of 15 slices shown (1 of 2)]
[im 1/15]
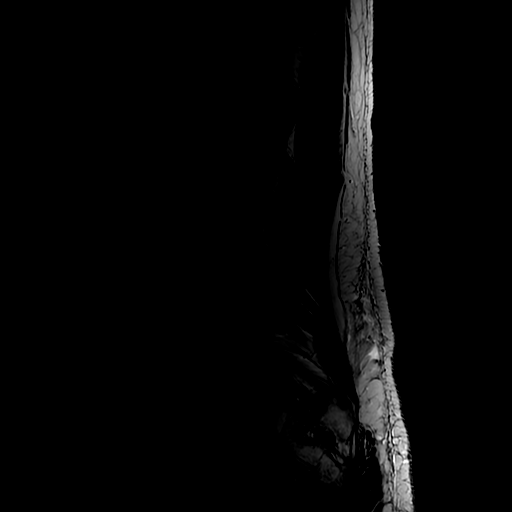
[im 8/15]
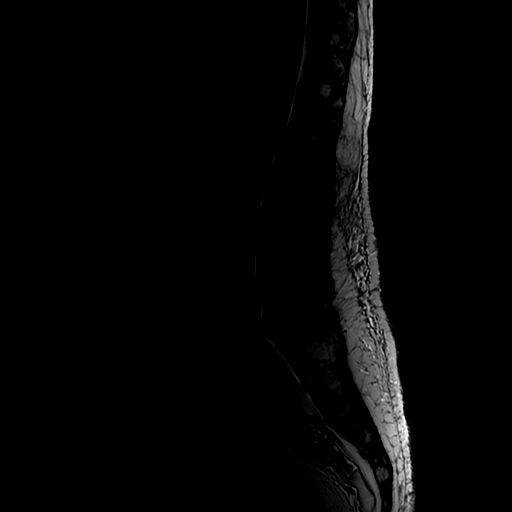
[im 15/15]
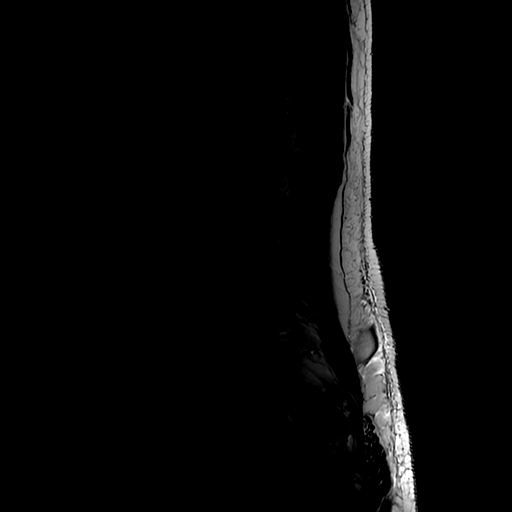

[Series 13: T1 · sagittal · 4.0mm · 0.55mm/px · 3 of 15 slices shown (1 of 2)]
[im 1/15]
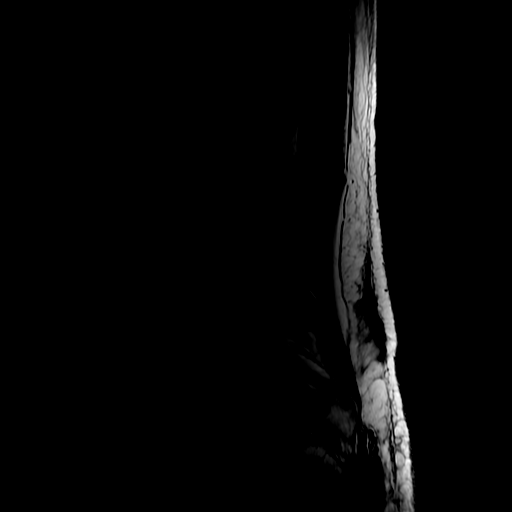
[im 8/15]
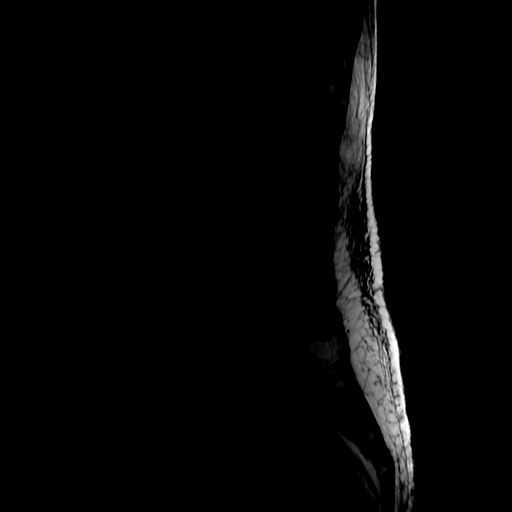
[im 15/15]
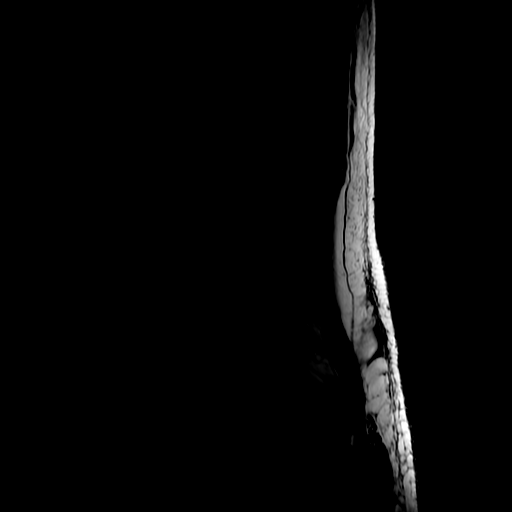

[Series 15: T2 · axial · 4.0mm · 0.39mm/px · z∈[-474,-291]mm · 7 of 40 slices shown (2 of 2)]
[im 1/40]
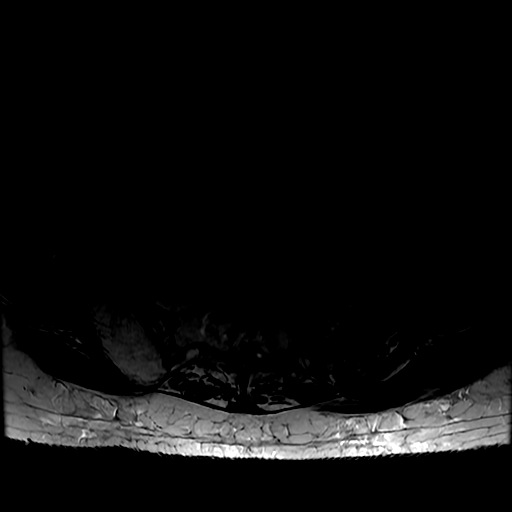
[im 5/40]
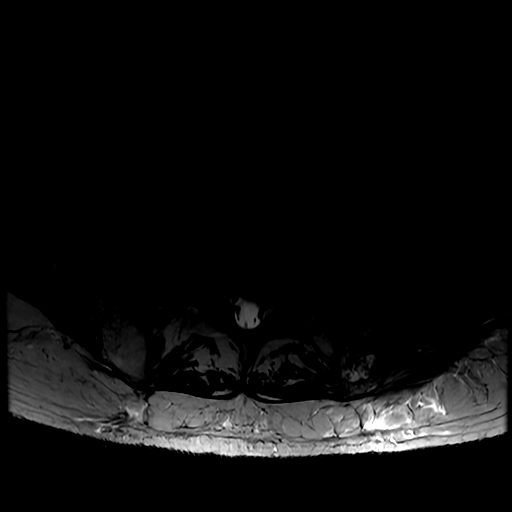
[im 10/40]
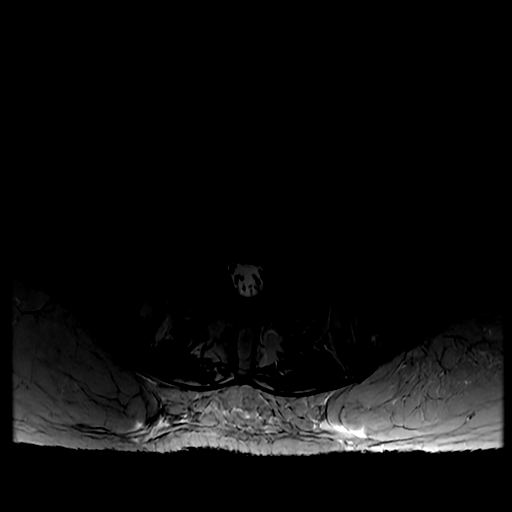
[im 15/40]
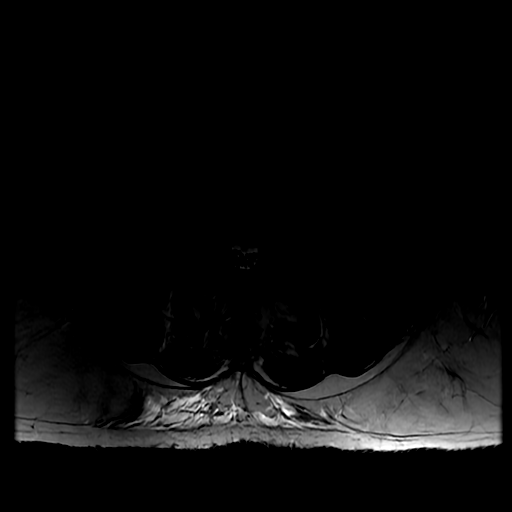
[im 20/40]
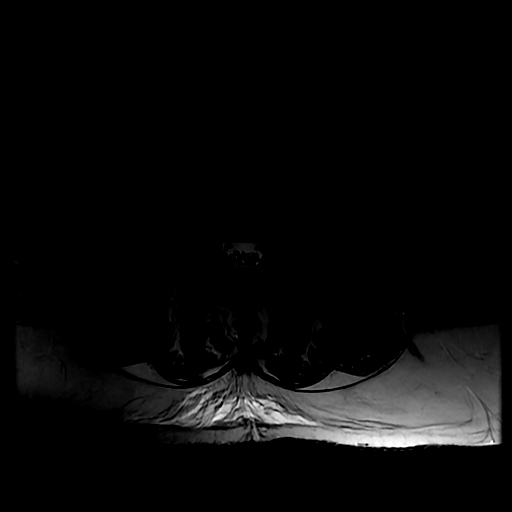
[im 25/40]
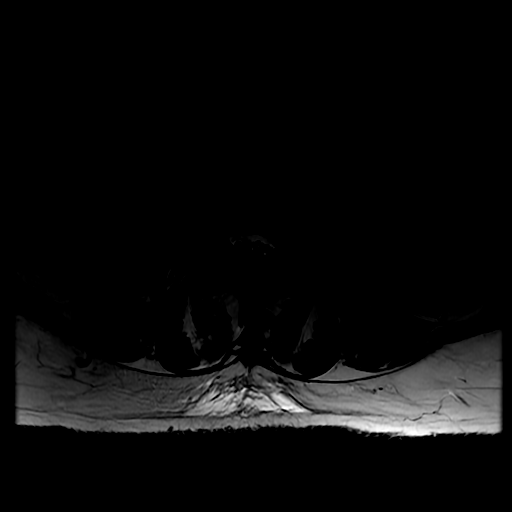
[im 35/40]
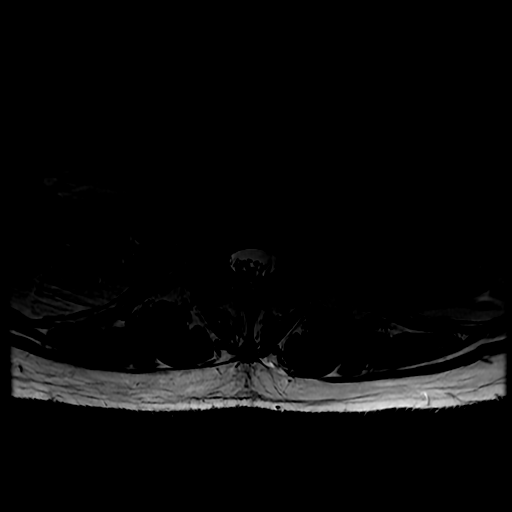

[Series 16: T1 · axial · 4.0mm · 0.39mm/px · z∈[-454,-291]mm · 3 of 40 slices shown (2 of 2)]
[im 5/40]
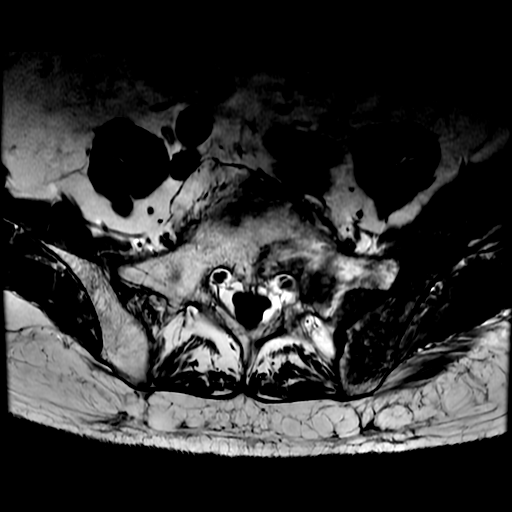
[im 20/40]
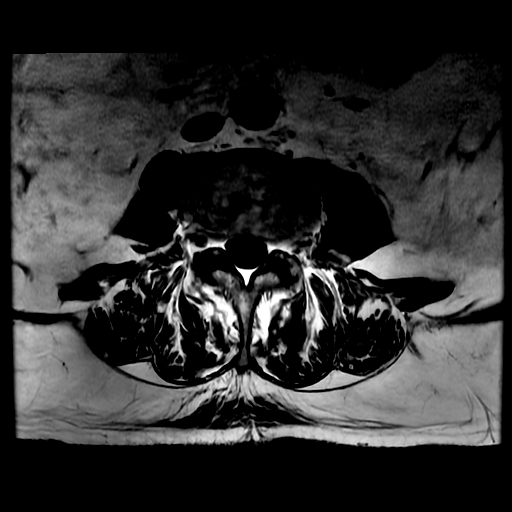
[im 35/40]
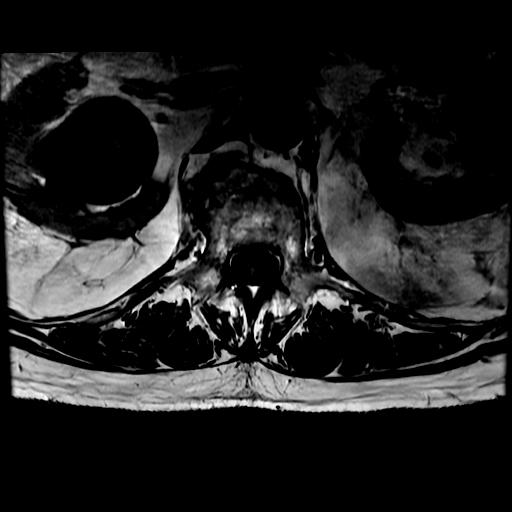

[16 of 48 positions shown; findings below may reference images not displayed]

FINDINGS: MRI THORACIC SPINE FINDINGS

Alignment: There is exaggerated thoracic lordosis. There is no
antero or retrolisthesis.

Vertebrae: There is a 1.0 cm focus of T1/T2 hypointensity in the T7
vertebral body without enhancement corresponding to a densely
sclerotic lesion seen on the prior CT favored to reflect a benign
bone island.

There is also nonenhancing T1 and T2 hypointensity in the adjacent
left seventh rib suspicious for metastatic disease given the
appearance on the prior CT. A separate focus of T1 and T2
hyperintensity in the T7 vertebral body is favored to reflect a
benign hemangioma. An additional probable hemangioma is seen in the
T3 vertebral body.

There are no other suspicious lesions.  There is no epidural tumor.

Vertebral body heights are preserved, without evidence of acute
injury. There are flowing anterior osteophytes throughout the
thoracic spine consistent with diffuse idiopathic skeletal
hyperostosis.

Cord: Normal in signal and morphology. There is no abnormal cord
enhancement.

Paraspinal and other soft tissues: The paraspinal soft tissues are
unremarkable. The known pulmonary metastatic disease is not well
assessed.

Disc levels:

The disc heights in the thoracic spine are overall preserved. There
is mild degenerative endplate change and facet arthropathy. There is
no significant spinal canal or neural foraminal stenosis, and no
evidence of cord or nerve root compression.

MRI LUMBAR SPINE FINDINGS

Segmentation: Standard; the lowest formed disc space is designated
L5-S1.

Alignment:  Normal.

Vertebrae: Vertebral body heights are preserved. There is no
evidence of acute fracture.

There is STIR signal abnormality with enhancement in the posterior
aspect of the L1 vertebral body which is favored to be degenerative
in nature when correlated with the prior CT.

Enhancing metastatic disease is seen in the imaged sacrum and left
iliac bone (19-3). Additional metastatic disease is seen in the left
L5 pedicle (13-3, 11-3). The lesion at L5 is increased in
size/conspicuity since 12/07/2021. There is no evidence of epidural
tumor.

Fatty marrow signal in the lower lumbar spine and sacrum likely
reflects sequela of prior radiation therapy.

Conus medullaris: Extends to the L1 level and appears normal. There
is no abnormal enhancement of the cauda equina nerve roots.

Paraspinal and other soft tissues: Dilation of the right collecting
system is unchanged compared to the prior CT without evidence of
hydroureter, again favored to reflect chronic UPJ obstruction.

Disc levels:

There is essentially obliteration of the disc space at L5-S1 and
disc desiccation and narrowing throughout the remainder of the
lumbar spine.

T12-L1: No significant spinal canal or neural foraminal stenosis

L1-L2: There is a mild disc bulge and bilateral facet arthropathy
resulting in mild spinal canal stenosis and mild left worse than
right neural foraminal stenosis, not significantly changed.

L2-L3: There is a mild disc bulge and bilateral facet arthropathy
resulting in mild spinal canal stenosis without significant neural
foraminal stenosis, not significantly changed.

L3-L4: There is mild disc bulge and bilateral facet arthropathy
resulting in mild bilateral neural foraminal stenosis without
significant spinal canal stenosis, not significantly changed.

L4-L5: There is a diffuse disc bulge eccentric to the left and
moderate bilateral facet arthropathy resulting in moderate spinal
canal stenosis with effacement of the left subarticular zone and
possible impingement of the traversing L5 nerve root and moderate to
severe left and mild right neural foraminal stenosis, unchanged

L5-S1: Degenerative endplate change and bilateral facet arthropathy
without significant spinal canal or neural foraminal stenosis.
IMPRESSION: 1. Osseous metastatic disease in the sacrum and left iliac bone,
left L5 pedicle, and left seventh rib. The lesion at L5 is increased
in size and conspicuity since 12/07/2021.
[DATE]. No evidence of epidural tumor or acute injury in the
thoracolumbar spine.
3. Signal abnormality in the L1 vertebral body is favored to be
degenerative in nature; attention on any follow-up studies. A
sclerotic lesion in the T7 vertebral body is favored to reflect a
benign bone island.
4. Degenerative changes in the lumbar spine most advanced at L4-L5
where there is moderate spinal canal stenosis with effacement of the
left subarticular zone and possible impingement of the traversing L5
nerve root and moderate to severe left neural foraminal stenosis,
overall not significantly changed.
5. No significant spinal canal or neural foraminal stenosis in the
thoracic spine.
6. Diffuse idiopathic skeletal hyperostosis in the thoracic spine.

## 2022-09-11 ENCOUNTER — Ambulatory Visit
Admission: RE | Admit: 2022-09-11 | Discharge: 2022-09-11 | Disposition: A | Discharge status: Home / Self Care | Payer: Medicare Other | Source: Ambulatory Visit | Attending: Radiation Oncology | Admitting: Radiation Oncology

## 2022-09-11 ENCOUNTER — Other Ambulatory Visit: Payer: Self-pay

## 2022-09-11 ENCOUNTER — Inpatient Hospital Stay: Payer: Medicare Other

## 2022-09-11 ENCOUNTER — Inpatient Hospital Stay: Payer: Medicare Other | Attending: Nurse Practitioner | Admitting: Nurse Practitioner

## 2022-09-11 ENCOUNTER — Encounter: Payer: Self-pay | Admitting: Nurse Practitioner

## 2022-09-11 VITALS — BP 131/75 | HR 70 | Temp 98.2°F | Resp 15 | Wt 163.5 lb

## 2022-09-11 DIAGNOSIS — C7951 Secondary malignant neoplasm of bone: Secondary | ICD-10-CM

## 2022-09-11 DIAGNOSIS — G893 Neoplasm related pain (acute) (chronic): Secondary | ICD-10-CM | POA: Diagnosis not present

## 2022-09-11 DIAGNOSIS — N390 Urinary tract infection, site not specified: Secondary | ICD-10-CM

## 2022-09-11 DIAGNOSIS — Z510 Encounter for antineoplastic radiation therapy: Secondary | ICD-10-CM | POA: Diagnosis not present

## 2022-09-11 DIAGNOSIS — R530 Neoplastic (malignant) related fatigue: Secondary | ICD-10-CM

## 2022-09-11 DIAGNOSIS — C61 Malignant neoplasm of prostate: Secondary | ICD-10-CM

## 2022-09-11 DIAGNOSIS — F32A Depression, unspecified: Secondary | ICD-10-CM

## 2022-09-11 DIAGNOSIS — Z515 Encounter for palliative care: Secondary | ICD-10-CM

## 2022-09-11 LAB — URINALYSIS, COMPLETE (UACMP) WITH MICROSCOPIC
Bilirubin Urine: NEGATIVE
Glucose, UA: NEGATIVE mg/dL
Ketones, ur: NEGATIVE mg/dL
Nitrite: POSITIVE — AB
Protein, ur: 100 mg/dL — AB
Specific Gravity, Urine: 1.012 (ref 1.005–1.030)
WBC, UA: >50 WBC/hpf — ABNORMAL HIGH (ref 0–5)
pH: 6 (ref 5.0–8.0)

## 2022-09-11 LAB — RAD ONC ARIA SESSION SUMMARY
Course Elapsed Days: 6
Plan Fractions Treated to Date: 5
Plan Prescribed Dose Per Fraction: 3 Gy
Plan Total Fractions Prescribed: 10
Plan Total Prescribed Dose: 30 Gy
Reference Point Dosage Given to Date: 15 Gy
Reference Point Session Dosage Given: 3 Gy
Session Number: 5

## 2022-09-11 MED ORDER — CIPROFLOXACIN HCL 500 MG PO TABS: 500.0000 mg | ORAL_TABLET | Freq: Two times a day (BID) | ORAL | 0 refills | Status: DC

## 2022-09-11 NOTE — Progress Notes (Signed)
[place]  [phone] [phone]   Name: Frederick Ponce Date: 09/11/2022 MRN: [id number]  DOB: [birth year]  Patient Care Team: [facility] as PCP - General Pickenpack-Cousar, Carlena Sax, NP as Nurse Practitioner (Nurse Practitioner)     REASON FOR CONSULTATION: Frederick Ponce is a 76 y.o. male with medical history including Parkinson's disease and hyperlipidemia. 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 He is having urgent radiation appointment due to iliac bone pain. Palliative ask to see for symptom management.    SOCIAL HISTORY:     reports that he has never smoked. He has never used smokeless tobacco. He reports current alcohol use of about 14.0 standard drinks of alcohol per week. He reports that he does not use drugs.  ADVANCE DIRECTIVES:  Patient is scheduled to complete advanced directives at our clinic here at the cancer center on 02/16/2022. MOST form provided.  Patient plans to review and complete at a later time.   CODE STATUS:   PAST MEDICAL HISTORY: Past Medical History:  Diagnosis Date   Hyperlipidemia    Parkinson disease ([place])    Prostate cancer ([place])     PAST SURGICAL HISTORY: History reviewed. No pertinent surgical history.  HEMATOLOGY/ONCOLOGY HISTORY:  Oncology History  Prostate 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 ([place])  01/18/2022 Initial Diagnosis   Prostate 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 ([place])   02/20/2022 - 02/20/2022 Chemotherapy   Patient is on Treatment Plan : PROSTATE Docetaxel q21d     05/23/2022 Cancer Staging   Staging form: Prostate, AJCC 8th Edition - Clinical: Stage IVB (cTX, cNX, cM1c) - Signed by Wyatt Portela, MD on 05/23/2022     ALLERGIES:  is allergic to gabapentin and celebrex [celecoxib].  MEDICATIONS:  Current Outpatient Medications  Medication Sig  Dispense Refill   ciprofloxacin (CIPRO) 500 MG tablet Take 1 tablet (500 mg total) by mouth 2 (two) times daily. 14 tablet 0   abiraterone acetate (ZYTIGA) 250 MG tablet Take 4 tablets (1,000 mg total) by mouth daily. Take on an empty stomach 1 hour before or 2 hours after a meal 120 tablet 0   amLODipine (NORVASC) 5 MG tablet Take 1 tablet (5 mg total) by mouth daily. 30 tablet 0   atorvastatin (LIPITOR) 40 MG tablet Take 40 mg by mouth every morning.     calcium-vitamin D (OSCAL WITH D) 500-5 MG-MCG tablet Take 1 tablet by mouth 2 (two) times daily. 60 tablet 3   carbidopa-levodopa (SINEMET) 25-100 MG tablet Take 1 tablet by mouth 3 (three) times daily. At 7 AM, 11AM, and 3PM. (Patient taking differently: Take 1 tablet by mouth 3 (three) times daily.) 270 tablet 3   COENZYME Q10 PO Take 1 capsule by mouth every morning.     DULoxetine (CYMBALTA) 20 MG capsule Take 2 capsules (40 mg total) by mouth daily. (Patient not taking: Reported on 08/29/2022) 60 capsule 3   ibuprofen (ADVIL) 600 MG tablet Take 600 mg by mouth every 6 (six) hours as needed (pain).     KLOR-CON M20 20 MEQ tablet TAKE 1 TABLET BY MOUTH DAILY 30 tablet 0   KRILL OIL PO Take 1 capsule by mouth every morning.     morphine (MS CONTIN) 15 MG 12 hr tablet Take 1 tablet (15 mg total) by mouth every 12 (twelve) hours. 15 tablet 0   multivitamin-lutein (OCUVITE-LUTEIN) CAPS capsule Take  1 capsule by mouth every morning.     predniSONE (DELTASONE) 5 MG tablet Take 5 mg by mouth daily with breakfast.     traMADol (ULTRAM) 50 MG tablet Take 50 mg by mouth every 6 (six) hours as needed (pain).     vitamin B-12 (CYANOCOBALAMIN) 1000 MCG tablet Take 1,000 mcg by mouth every morning.     No current facility-administered medications for this visit.    PERFORMANCE STATUS (ECOG) : 1 - Symptomatic but completely ambulatory   IMPRESSION:  Mr. Alicea and his husband presented to clinic today for follow-up. He is complaining of dysuria and  frequency. He states due to some frustration he chose to stop taking many of his medications which he now knows was not the best decision. He is however still taking his MS Contin at bedtime. Complaining of ongoing chills and rigors which has been worst over the past week. We will obtain urine specimen to rule out UTI.   Emotional support provided. [name] and [name] are concerned about his health and well being. We discussed ongoing support. Unfortunately he never received a call from [facility] however we were able to get him an appointment at [facility] which he has an appointment coming up per patient.   He is actively undergoing radiation therapy to the pelvis area after new lesion was discovered on PET scan. Tolerating well.   We will continue to closely follow. No medication adjustments at this time.   Constipation in the setting of MS Contin. Encouraged continued use of Miralax daily. He knows if no bowel movement within 48hrs he may increase to twice daily.   Addendum: Patient's Urinalysis positive for UTI, Culture and sensitive completed with results of E. Coli.    PLAN: MS Contin 15 mg daily. Will only receive week supply for safety.  Tylenol extra strength 3 times a day as needed Cymbalta 20 mg daily Prune juice, tea, MiraLAX daily for bowel regimen Cipro 500 mg twice daily x7 days   We will plan to have phone follow-up in 3-4 weeks for symptom follow-up. I will see patient back in 6-8 weeks in collaboration to other oncology appointments.   Patient expressed understanding and was in agreement with this plan. He also understands that He can call the clinic at any time with any questions, concerns, or complaints.       Any controlled substances utilized were prescribed in the context of palliative care. PDMP has been reviewed.    Time Total: 50 min   Visit consisted of counseling and education dealing with the complex and emotionally intense issues of  symptom management and palliative care in the setting of serious and potentially life-threatening illness.Greater than 50%  of this time was spent counseling and coordinating care related to the above assessment and plan.  Alda Lea, AGPCNP-BC  Palliative Medicine Team/[place]

## 2022-09-11 NOTE — Progress Notes (Signed)
UA ordered per NP

## 2022-09-12 ENCOUNTER — Ambulatory Visit
Admission: RE | Admit: 2022-09-12 | Discharge: 2022-09-12 | Disposition: A | Discharge status: Home / Self Care | Payer: Medicare Other | Source: Ambulatory Visit | Attending: Radiation Oncology | Admitting: Radiation Oncology

## 2022-09-12 ENCOUNTER — Other Ambulatory Visit: Payer: Self-pay

## 2022-09-12 ENCOUNTER — Encounter: Payer: Self-pay | Admitting: General Practice

## 2022-09-12 DIAGNOSIS — Z510 Encounter for antineoplastic radiation therapy: Secondary | ICD-10-CM | POA: Diagnosis not present

## 2022-09-12 LAB — RAD ONC ARIA SESSION SUMMARY
Course Elapsed Days: 7
Plan Fractions Treated to Date: 6
Plan Prescribed Dose Per Fraction: 3 Gy
Plan Total Fractions Prescribed: 10
Plan Total Prescribed Dose: 30 Gy
Reference Point Dosage Given to Date: 18 Gy
Reference Point Session Dosage Given: 3 Gy
Session Number: 6

## 2022-09-13 ENCOUNTER — Inpatient Hospital Stay: Payer: Medicare Other | Admitting: General Practice

## 2022-09-13 ENCOUNTER — Other Ambulatory Visit: Payer: Self-pay

## 2022-09-13 ENCOUNTER — Ambulatory Visit
Admission: RE | Admit: 2022-09-13 | Discharge: 2022-09-13 | Disposition: A | Discharge status: Home / Self Care | Payer: Medicare Other | Source: Ambulatory Visit | Attending: Radiation Oncology | Admitting: Radiation Oncology

## 2022-09-13 ENCOUNTER — Other Ambulatory Visit (HOSPITAL_COMMUNITY): Payer: Self-pay

## 2022-09-13 DIAGNOSIS — Z510 Encounter for antineoplastic radiation therapy: Secondary | ICD-10-CM | POA: Diagnosis not present

## 2022-09-13 LAB — RAD ONC ARIA SESSION SUMMARY
Course Elapsed Days: 8
Plan Fractions Treated to Date: 7
Plan Prescribed Dose Per Fraction: 3 Gy
Plan Total Fractions Prescribed: 10
Plan Total Prescribed Dose: 30 Gy
Reference Point Dosage Given to Date: 21 Gy
Reference Point Session Dosage Given: 3 Gy
Session Number: 7

## 2022-09-13 LAB — URINE CULTURE: Culture: >=100000 — AB

## 2022-09-13 NOTE — Progress Notes (Signed)
[facility] Spiritual Care Note  Met with Frederick Ponce for one hour in my office, primarily focusing on rapport-building and life review, with a focus on meaningful relationships and experiences, as well as present challenges.   Provided reflective listening and normalization of feelings.   Frederick Ponce plans to phone to schedule a follow-up appointment with a target of next week.   Chaplain Lisa Lundeen, MDiv, [facility] Pager [phone] Voicemail [phone]  

## 2022-09-14 ENCOUNTER — Other Ambulatory Visit: Payer: Self-pay

## 2022-09-14 ENCOUNTER — Telehealth: Payer: Self-pay

## 2022-09-14 ENCOUNTER — Encounter: Payer: Self-pay | Admitting: Oncology

## 2022-09-14 ENCOUNTER — Ambulatory Visit
Admission: RE | Admit: 2022-09-14 | Discharge: 2022-09-14 | Disposition: A | Discharge status: Home / Self Care | Payer: Medicare Other | Source: Ambulatory Visit | Attending: Radiation Oncology | Admitting: Radiation Oncology

## 2022-09-14 DIAGNOSIS — Z510 Encounter for antineoplastic radiation therapy: Secondary | ICD-10-CM | POA: Diagnosis not present

## 2022-09-14 LAB — RAD ONC ARIA SESSION SUMMARY
Course Elapsed Days: 9
Plan Fractions Treated to Date: 8
Plan Prescribed Dose Per Fraction: 3 Gy
Plan Total Fractions Prescribed: 10
Plan Total Prescribed Dose: 30 Gy
Reference Point Dosage Given to Date: 24 Gy
Reference Point Session Dosage Given: 3 Gy
Session Number: 8

## 2022-09-14 NOTE — Telephone Encounter (Addendum)
Patient called with concerns that  no one is addressing concerns about all of the medications he is taking and their side effects. Patient reports issues with his stomach, nausea without emesis. Patient does not have GI doctor and is in the process of switching PCP with an appointment scheduled for 28th. Next appointment with Dr. Alen Blew is 10/04/22. Patient is requesting an appointment sooner.  Patient requesting a GI referral.  Routed to provider.  Per Dr. Alen Blew, patient scheduled for labs and MD visit on 09/17/22. Called and left VM for patient to advise. Requested that he call back to confirm receipt of message.

## 2022-09-17 ENCOUNTER — Other Ambulatory Visit: Payer: Self-pay

## 2022-09-17 ENCOUNTER — Ambulatory Visit (INDEPENDENT_AMBULATORY_CARE_PROVIDER_SITE_OTHER): Payer: Medicare Other | Admitting: Nurse Practitioner

## 2022-09-17 ENCOUNTER — Ambulatory Visit
Admission: RE | Admit: 2022-09-17 | Discharge: 2022-09-17 | Disposition: A | Discharge status: Home / Self Care | Payer: Medicare Other | Source: Ambulatory Visit | Attending: Radiation Oncology | Admitting: Radiation Oncology

## 2022-09-17 ENCOUNTER — Inpatient Hospital Stay: Payer: Medicare Other

## 2022-09-17 ENCOUNTER — Encounter: Payer: Self-pay | Admitting: Nurse Practitioner

## 2022-09-17 ENCOUNTER — Inpatient Hospital Stay (HOSPITAL_BASED_OUTPATIENT_CLINIC_OR_DEPARTMENT_OTHER): Payer: Medicare Other | Admitting: Oncology

## 2022-09-17 VITALS — BP 124/60 | HR 83 | Temp 98.6°F | Ht 68.0 in | Wt 161.2 lb

## 2022-09-17 VITALS — BP 124/61 | HR 89 | Temp 98.1°F | Resp 16 | Ht 68.0 in | Wt 162.5 lb

## 2022-09-17 DIAGNOSIS — R109 Unspecified abdominal pain: Secondary | ICD-10-CM

## 2022-09-17 DIAGNOSIS — T50902A Poisoning by unspecified drugs, medicaments and biological substances, intentional self-harm, initial encounter: Secondary | ICD-10-CM

## 2022-09-17 DIAGNOSIS — C61 Malignant neoplasm of prostate: Secondary | ICD-10-CM

## 2022-09-17 DIAGNOSIS — M898X8 Other specified disorders of bone, other site: Secondary | ICD-10-CM

## 2022-09-17 DIAGNOSIS — C7951 Secondary malignant neoplasm of bone: Secondary | ICD-10-CM

## 2022-09-17 DIAGNOSIS — I10 Essential (primary) hypertension: Secondary | ICD-10-CM

## 2022-09-17 DIAGNOSIS — Z510 Encounter for antineoplastic radiation therapy: Secondary | ICD-10-CM | POA: Diagnosis not present

## 2022-09-17 DIAGNOSIS — M720 Palmar fascial fibromatosis [Dupuytren]: Secondary | ICD-10-CM

## 2022-09-17 DIAGNOSIS — Z7689 Persons encountering health services in other specified circumstances: Secondary | ICD-10-CM

## 2022-09-17 DIAGNOSIS — Z2821 Immunization not carried out because of patient refusal: Secondary | ICD-10-CM

## 2022-09-17 DIAGNOSIS — N390 Urinary tract infection, site not specified: Secondary | ICD-10-CM

## 2022-09-17 DIAGNOSIS — Z515 Encounter for palliative care: Secondary | ICD-10-CM

## 2022-09-17 DIAGNOSIS — T50902D Poisoning by unspecified drugs, medicaments and biological substances, intentional self-harm, subsequent encounter: Secondary | ICD-10-CM

## 2022-09-17 DIAGNOSIS — R1013 Epigastric pain: Secondary | ICD-10-CM

## 2022-09-17 DIAGNOSIS — E785 Hyperlipidemia, unspecified: Secondary | ICD-10-CM

## 2022-09-17 DIAGNOSIS — C44619 Basal cell carcinoma of skin of left upper limb, including shoulder: Secondary | ICD-10-CM | POA: Diagnosis not present

## 2022-09-17 DIAGNOSIS — G20 Parkinson's disease: Secondary | ICD-10-CM

## 2022-09-17 DIAGNOSIS — G893 Neoplasm related pain (acute) (chronic): Secondary | ICD-10-CM

## 2022-09-17 LAB — CBC WITH DIFFERENTIAL (CANCER CENTER ONLY)
Abs Immature Granulocytes: 0.03 10*3/uL (ref 0.00–0.07)
Basophils Absolute: 0 10*3/uL (ref 0.0–0.1)
Basophils Relative: 0 %
Eosinophils Absolute: 0.2 10*3/uL (ref 0.0–0.5)
Eosinophils Relative: 3 %
HCT: 34 % — ABNORMAL LOW (ref 39.0–52.0)
Hemoglobin: 11.4 g/dL — ABNORMAL LOW (ref 13.0–17.0)
Immature Granulocytes: 1 %
Lymphocytes Relative: 11 %
Lymphs Abs: 0.7 10*3/uL (ref 0.7–4.0)
MCH: 33.1 pg (ref 26.0–34.0)
MCHC: 33.5 g/dL (ref 30.0–36.0)
MCV: 98.8 fL (ref 80.0–100.0)
Monocytes Absolute: 0.5 10*3/uL (ref 0.1–1.0)
Monocytes Relative: 8 %
Neutro Abs: 4.8 10*3/uL (ref 1.7–7.7)
Neutrophils Relative %: 77 %
Platelet Count: 267 10*3/uL (ref 150–400)
RBC: 3.44 MIL/uL — ABNORMAL LOW (ref 4.22–5.81)
RDW: 12.9 % (ref 11.5–15.5)
WBC Count: 6.3 10*3/uL (ref 4.0–10.5)
nRBC: 0 % (ref 0.0–0.2)

## 2022-09-17 LAB — IRON AND IRON BINDING CAPACITY (CC-WL,HP ONLY)
Iron: 33 ug/dL — ABNORMAL LOW (ref 45–182)
Saturation Ratios: 14 % — ABNORMAL LOW (ref 17.9–39.5)
TIBC: 235 ug/dL — ABNORMAL LOW (ref 250–450)
UIBC: 202 ug/dL (ref 117–376)

## 2022-09-17 LAB — CMP (CANCER CENTER ONLY)
ALT: 5 U/L (ref 0–44)
AST: 14 U/L — ABNORMAL LOW (ref 15–41)
Albumin: 3.7 g/dL (ref 3.5–5.0)
Alkaline Phosphatase: 49 U/L (ref 38–126)
Anion gap: 5 (ref 5–15)
BUN: 13 mg/dL (ref 8–23)
CO2: 28 mmol/L (ref 22–32)
Calcium: 8.5 mg/dL — ABNORMAL LOW (ref 8.9–10.3)
Chloride: 105 mmol/L (ref 98–111)
Creatinine: 0.89 mg/dL (ref 0.61–1.24)
GFR, Estimated: >60 mL/min (ref >60)
Glucose, Bld: 159 mg/dL — ABNORMAL HIGH (ref 70–99)
Potassium: 4.1 mmol/L (ref 3.5–5.1)
Sodium: 138 mmol/L (ref 135–145)
Total Bilirubin: 0.9 mg/dL (ref 0.3–1.2)
Total Protein: 6.7 g/dL (ref 6.5–8.1)

## 2022-09-17 LAB — URINALYSIS, COMPLETE (UACMP) WITH MICROSCOPIC
Bilirubin Urine: NEGATIVE
Glucose, UA: NEGATIVE mg/dL
Hgb urine dipstick: NEGATIVE
Ketones, ur: NEGATIVE mg/dL
Nitrite: NEGATIVE
Protein, ur: NEGATIVE mg/dL
Specific Gravity, Urine: 1.01 (ref 1.005–1.030)
pH: 5 (ref 5.0–8.0)

## 2022-09-17 LAB — RAD ONC ARIA SESSION SUMMARY
Course Elapsed Days: 12
Plan Fractions Treated to Date: 9
Plan Prescribed Dose Per Fraction: 3 Gy
Plan Total Fractions Prescribed: 10
Plan Total Prescribed Dose: 30 Gy
Reference Point Dosage Given to Date: 27 Gy
Reference Point Session Dosage Given: 3 Gy
Session Number: 9

## 2022-09-17 MED ORDER — MORPHINE SULFATE ER 15 MG PO TBCR: 15.0000 mg | EXTENDED_RELEASE_TABLET | Freq: Two times a day (BID) | ORAL | 0 refills | Status: DC

## 2022-09-17 MED ORDER — ATORVASTATIN CALCIUM 40 MG PO TABS: 40.0000 mg | ORAL_TABLET | Freq: Every morning | ORAL | 1 refills | Status: DC

## 2022-09-17 MED ORDER — AMLODIPINE BESYLATE 5 MG PO TABS: 5.0000 mg | ORAL_TABLET | Freq: Every day | ORAL | 1 refills | Status: DC

## 2022-09-17 NOTE — Progress Notes (Signed)
Frederick Ponce,acting as a Education administrator for Frederick Brine, FNP.,have documented all relevant documentation on the behalf of Frederick Brine, FNP,as directed by  Frederick Brine, FNP while in the presence of Frederick Ponce, [place].    Subjective:     Patient ID: Frederick Ponce , male    DOB: September 09, 1946 , 76 y.o.   MRN: [id number]   Chief Complaint  Patient presents with   Establish Care    HPI  Patient presents today to establish care. He had been seen at [facility] 6 weeks ago for an AWV.  Relocated here in September 2022. He was diagnosed with prostate cancer which has metastasized to the bone (lesions) - had radiation to spine and had been having pain to left leg. He will have horrible pain - he would also have pain to right chest area (lungs). Last month PSA decreased, after finding out about the back. He has stopped taking the oral chemo but still takes the shot to decrease the testosterone. He had an appt with Dr. Alen Blew today next appt October 13th. He now has GI issues. He is currently on antibiotics for a kidney infection. He was diagnosed with Parkinsons for the last 5 years. He is on Carbolevopa 4 pills a day - being followed by Dr. Rexene Alberts.   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 Relocated from [place]. Married to his partner for 49 years. No children.   Father - cancer, Mother - cancer, died from e coli.  Brother - deceased jaw cancer.   Patient states he has prostate cancer that has metastasized to his lungs and lymph nodes. Patient needs help controlling his bp and cholesterol. Patient states he currently has a urinary infection he is being treated for. Patient states he tried to commit suicide about a month ago, he states he took a lot of pills(morphine) he had he was hospitalized for a week.  He was referred to a program with the [place]. Due to his pain to his left leg and had not diagnosed. He took the majority of his long acting morphine so it took 2 days  to kick in. He is going to counseling with the cancer center. State representative for mental health "Sonia Baller" felt was related to his pain. He is under Palliative Care for pain management. He is taking injections to help keep his testosterone levels low. During the time he was having that excruciating pain he was taking ibuprofen gel caps. His blood pressure was elevated 2 months ago when hospitalized.   He had covid earlier this month.   Will have a couple glasses of wine a week.  He can not take omeprazole due to the urinary tract infection. He was also on prednisone but was discontinued today     Past Medical History:  Diagnosis Date   Arthritis    Depression    GERD (gastroesophageal reflux disease)    Hyperlipidemia    Hypertension    Parkinson disease ([place])    Prostate cancer ([place])      Family History  Problem Relation Age of Onset   Cancer Mother    Kidney disease Mother    Heart disease Father    Tremor Father    Parkinson's disease Father    Melanoma Sister    Parkinson's disease Sister    Cancer Brother    Parkinson's disease Paternal Grandmother      Current Outpatient Medications:    abiraterone acetate (ZYTIGA) 250 MG  tablet, Take 4 tablets (1,000 mg total) by mouth daily. Take on an empty stomach 1 hour before or 2 hours after a meal, Disp: 120 tablet, Rfl: 0   calcium-vitamin D (OSCAL WITH D) 500-5 MG-MCG tablet, Take 1 tablet by mouth 2 (two) times daily., Disp: 60 tablet, Rfl: 3   carbidopa-levodopa (SINEMET) 25-100 MG tablet, Take 1 tablet by mouth 3 (three) times daily. At 7 AM, 11AM, and 3PM. (Patient taking differently: Take 1 tablet by mouth 3 (three) times daily.), Disp: 270 tablet, Rfl: 3   ciprofloxacin (CIPRO) 500 MG tablet, Take 1 tablet (500 mg total) by mouth 2 (two) times daily., Disp: 14 tablet, Rfl: 0   COENZYME Q10 PO, Take 1 capsule by mouth every morning., Disp: , Rfl:    ibuprofen (ADVIL) 600 MG tablet, Take 600 mg by mouth every 6 (six)  hours as needed (pain)., Disp: , Rfl:    KRILL OIL PO, Take 1 capsule by mouth every morning., Disp: , Rfl:    morphine (MS CONTIN) 15 MG 12 hr tablet, Take 1 tablet (15 mg total) by mouth every 12 (twelve) hours., Disp: 15 tablet, Rfl: 0   multivitamin-lutein (OCUVITE-LUTEIN) CAPS capsule, Take 1 capsule by mouth every morning., Disp: , Rfl:    predniSONE (DELTASONE) 5 MG tablet, Take 5 mg by mouth daily with breakfast., Disp: , Rfl:    traMADol (ULTRAM) 50 MG tablet, Take 50 mg by mouth every 6 (six) hours as needed (pain)., Disp: , Rfl:    vitamin B-12 (CYANOCOBALAMIN) 1000 MCG tablet, Take 1,000 mcg by mouth every morning., Disp: , Rfl:    amLODipine (NORVASC) 5 MG tablet, Take 1 tablet (5 mg total) by mouth daily., Disp: 90 tablet, Rfl: 1   atorvastatin (LIPITOR) 40 MG tablet, Take 1 tablet (40 mg total) by mouth every morning., Disp: 90 tablet, Rfl: 1   DULoxetine (CYMBALTA) 20 MG capsule, Take 2 capsules (40 mg total) by mouth daily. (Patient not taking: Reported on 09/17/2022), Disp: 60 capsule, Rfl: 3   KLOR-CON M20 20 MEQ tablet, TAKE 1 TABLET BY MOUTH DAILY, Disp: 30 tablet, Rfl: 0   Allergies  Allergen Reactions   Gabapentin Other (See Comments)    Restless legs/confusion   Celebrex [Celecoxib] Rash     Review of Systems  Constitutional: Negative.   HENT: Negative.    Eyes: Negative.   Respiratory: Negative.    Cardiovascular: Negative.   Gastrointestinal: Negative.   Genitourinary: Negative.   Neurological: Negative.   Psychiatric/Behavioral: Negative.       Today's Vitals   09/17/22 1502  BP: 124/60  Pulse: 83  Temp: 98.6 F (37 C)  TempSrc: Oral  Weight: 161 lb 3.2 oz (73.1 kg)  Height: '5\' 8"'$  (1.727 m)  PainSc: 4   PainLoc: Knee   Body mass index is 24.51 kg/m.   Objective:  Physical Exam Vitals reviewed.  Constitutional:      General: He is not in acute distress.    Appearance: Normal appearance. He is obese.  HENT:     Head: Normocephalic.   Cardiovascular:     Rate and Rhythm: Normal rate and regular rhythm.     Pulses: Normal pulses.     Heart sounds: Normal heart sounds. No murmur heard. Pulmonary:     Effort: Pulmonary effort is normal. No respiratory distress.     Breath sounds: Normal breath sounds. No wheezing.  Musculoskeletal:        General: Normal range of motion.  Skin:  General: Skin is warm and dry.     Capillary Refill: Capillary refill takes less than 2 seconds.  Neurological:     General: No focal deficit present.     Mental Status: He is alert and oriented to person, place, and time.  Psychiatric:        Mood and Affect: Mood normal.        Behavior: Behavior normal.        Thought Content: Thought content normal.        Judgment: Judgment normal.         Assessment And Plan:     1. Essential hypertension Comments: Blood pressure is well controlled, continue current medications - amLODipine (NORVASC) 5 MG tablet; Take 1 tablet (5 mg total) by mouth daily.  Dispense: 90 tablet; Refill: 1  2. Prostate 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 [facility]) Comments: Currently being followed by Oncology and has palliative care for his pain management  3. 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 Parkinson's disease ([place]) Comments: Continue f/u with Dr. Rexene Alberts  5. Iliac bone pain  6. Hyperlipidemia, unspecified hyperlipidemia type Comments: Continue statin - atorvastatin (LIPITOR) 40 MG tablet; Take 1 tablet (40 mg total) by mouth every morning.  Dispense: 90 tablet; Refill: 1 - Lipid panel  7. Intentional drug overdose, subsequent encounter Comments: Was admitted to hospital for 3 days. Now seeing counselor and felt was related to having pain to hip related to bone cancer .  8. Dupuytren's disease  9. Herpes zoster vaccination declined Declines shingrix, educated on disease process and is aware if he changes his mind to notify office  10. Immunization declined Patient declined influenza vaccination  at this time. Patient is aware that influenza vaccine prevents illness in 70% of healthy people, and reduces hospitalizations to 30-70% in elderly. This vaccine is recommended annually. Education has been provided regarding the importance of this vaccine but patient still declined. Advised may receive this vaccine at local pharmacy or Health Dept.or vaccine clinic. Aware to provide a copy of the vaccination record if obtained from local pharmacy or Health Dept.  Pt is willing to accept risk associated with refusing vaccination.   11. Encounter to establish care     Patient was given opportunity to ask questions. Patient verbalized understanding of the plan and was able to repeat key elements of the plan. All questions were answered to their satisfaction.  Frederick Brine, FNP   I, Frederick Brine, FNP, have reviewed all documentation for this visit. The documentation on 09/17/22 for the exam, diagnosis, procedures, and orders are all accurate and complete.   IF YOU HAVE BEEN REFERRED TO A SPECIALIST, IT MAY TAKE 1-2 WEEKS TO SCHEDULE/PROCESS THE REFERRAL. IF YOU HAVE NOT HEARD FROM US/SPECIALIST IN TWO WEEKS, PLEASE GIVE [place] A CALL AT [phone] X [phone].   THE PATIENT IS ENCOURAGED TO PRACTICE SOCIAL DISTANCING DUE TO THE COVID-19 PANDEMIC.

## 2022-09-17 NOTE — Patient Instructions (Signed)
Health Maintenance, Male Adopting a healthy lifestyle and getting preventive care are important in promoting health and wellness. Ask your health care provider about: The right schedule for you to have regular tests and exams. Things you can do on your own to prevent diseases and keep yourself healthy. What should I know about diet, weight, and exercise? Eat a healthy diet  Eat a diet that includes plenty of vegetables, fruits, low-fat dairy products, and lean protein. Do not eat a lot of foods that are high in solid fats, added sugars, or sodium. Maintain a healthy weight Body mass index (BMI) is a measurement that can be used to identify possible weight problems. It estimates body fat based on height and weight. Your health care provider can help determine your BMI and help you achieve or maintain a healthy weight. Get regular exercise Get regular exercise. This is one of the most important things you can do for your health. Most adults should: Exercise for at least 150 minutes each week. The exercise should increase your heart rate and make you sweat (moderate-intensity exercise). Do strengthening exercises at least twice a week. This is in addition to the moderate-intensity exercise. Spend less time sitting. Even light physical activity can be beneficial. Watch cholesterol and blood lipids Have your blood tested for lipids and cholesterol at 77 years of age, then have this test every 5 years. You may need to have your cholesterol levels checked more often if: Your lipid or cholesterol levels are high. You are older than 77 years of age. You are at high risk for heart disease. What should I know about cancer screening? Many types of cancers can be detected early and may often be prevented. Depending on your health history and family history, you may need to have cancer screening at various ages. This may include screening for: Colorectal cancer. Prostate cancer. Skin cancer. Lung  cancer. What should I know about heart disease, diabetes, and high blood pressure? Blood pressure and heart disease High blood pressure causes heart disease and increases the risk of stroke. This is more likely to develop in people who have high blood pressure readings or are overweight. Talk with your health care provider about your target blood pressure readings. Have your blood pressure checked: Every 3-5 years if you are 18-39 years of age. Every year if you are 40 years old or older. If you are between the ages of 65 and 75 and are a current or former smoker, ask your health care provider if you should have a one-time screening for abdominal aortic aneurysm (AAA). Diabetes Have regular diabetes screenings. This checks your fasting blood sugar level. Have the screening done: Once every three years after age 45 if you are at a normal weight and have a low risk for diabetes. More often and at a younger age if you are overweight or have a high risk for diabetes. What should I know about preventing infection? Hepatitis B If you have a higher risk for hepatitis B, you should be screened for this virus. Talk with your health care provider to find out if you are at risk for hepatitis B infection. Hepatitis C Blood testing is recommended for: Everyone born from 1945 through 1965. Anyone with known risk factors for hepatitis C. Sexually transmitted infections (STIs) You should be screened each year for STIs, including gonorrhea and chlamydia, if: You are sexually active and are younger than 77 years of age. You are older than 77 years of age and your   health care provider tells you that you are at risk for this type of infection. Your sexual activity has changed since you were last screened, and you are at increased risk for chlamydia or gonorrhea. Ask your health care provider if you are at risk. Ask your health care provider about whether you are at high risk for HIV. Your health care provider  may recommend a prescription medicine to help prevent HIV infection. If you choose to take medicine to prevent HIV, you should first get tested for HIV. You should then be tested every 3 months for as long as you are taking the medicine. Follow these instructions at home: Alcohol use Do not drink alcohol if your health care provider tells you not to drink. If you drink alcohol: Limit how much you have to 0-2 drinks a day. Know how much alcohol is in your drink. In the [place] one drink equals one 12 oz bottle of beer (355 mL), one 5 oz glass of wine (148 mL), or one 1 oz glass of hard liquor (44 mL). Lifestyle Do not use any products that contain nicotine or tobacco. These products include cigarettes, chewing tobacco, and vaping devices, such as e-cigarettes. If you need help quitting, ask your health care provider. Do not use street drugs. Do not share needles. Ask your health care provider for help if you need support or information about quitting drugs. General instructions Schedule regular health, dental, and eye exams. Stay current with your vaccines. Tell your health care provider if: You often feel depressed. You have ever been abused or do not feel safe at home. Summary Adopting a healthy lifestyle and getting preventive care are important in promoting health and wellness. Follow your health care provider's instructions about healthy diet, exercising, and getting tested or screened for diseases. Follow your health care provider's instructions on monitoring your cholesterol and blood pressure. This information is not intended to replace advice given to you by your health care provider. Make sure you discuss any questions you have with your health care provider. Document Revised: 05/01/2021 Document Reviewed: 05/01/2021 Elsevier Patient Education  2023 Elsevier Inc.  

## 2022-09-17 NOTE — Progress Notes (Signed)
Referral faxed to [facility].   RN will follow up to ensure appointment is made and patient aware.

## 2022-09-17 NOTE — Telephone Encounter (Signed)
Pt called for refill, see new orders.  

## 2022-09-17 NOTE — Progress Notes (Signed)
Hematology and Oncology Follow Up Visit  Frederick Ponce [id number] [birth year] 76 y.o. 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  He presented with PSA of 22.5.   Prior Therapy: CT-guided biopsy of a pulmonary nodule obtained on January 15, 2022 which confirmed the presence of prostate cancer.  He is status post radiation therapy to the left sacroiliac pelvic bones completed in February 2023.  He received 30 Gray in 10 fractions  Firmagon 240 mg started on January 23, 2022.  Last injection was given on February 20, 2022.    Current therapy:  Eligard 30 mg every 4 months started on March 20, 2022.  Last injection was given in August 2023.  Zytiga 1000 mg daily with prednisone 5 mg daily started on March 06, 2022.  Xgeva 120 mg every 2 months.  This was given in August 2023.  He is currently receiving radiation therapy to left pelvis was planned to receive 30 Gray in 10 fractions.   Interim History: Frederick Ponce returns today for repeat evaluation.  Since last visit, he reports a few complaints which has impacted his quality of life.  He has reported abdominal pain associated with gas and dyspepsia.  He elected to stop the Zytiga on his own but continues to take prednisone.  The symptoms did not improve with this maneuver.  He denies any nausea or vomiting.  He reports he has good appetite but has reported postprandial pain.  He denies any weight loss.  His performance status and quality of life remains reasonable.  His hip pain remains manageable although he still requiring morphine once to twice a day.  He is currently undergoing radiation therapy.  He has also reported urinary symptoms including urinary frequency, urgency and nocturia.  He has also difficulty emptying his bladder.  He was  diagnosed with a urinary tract infection and currently on Cipro.    Medications: Updated on review. Current Outpatient Medications  Medication Sig Dispense Refill   abiraterone acetate (ZYTIGA) 250 MG tablet Take 4 tablets (1,000 mg total) by mouth daily. Take on an empty stomach 1 hour before or 2 hours after a meal 120 tablet 0   amLODipine (NORVASC) 5 MG tablet Take 1 tablet (5 mg total) by mouth daily. 30 tablet 0   atorvastatin (LIPITOR) 40 MG tablet Take 40 mg by mouth every morning.     calcium-vitamin D (OSCAL WITH D) 500-5 MG-MCG tablet Take 1 tablet by mouth 2 (two) times daily. 60 tablet 3   carbidopa-levodopa (SINEMET) 25-100 MG tablet Take 1 tablet by mouth 3 (three) times daily. At 7 AM, 11AM, and 3PM. (Patient taking differently: Take 1 tablet by mouth 3 (three) times daily.) 270 tablet 3   ciprofloxacin (CIPRO) 500 MG tablet Take 1 tablet (500 mg total) by mouth 2 (two) times daily. 14 tablet 0   COENZYME Q10 PO Take 1 capsule by mouth every morning.     DULoxetine (CYMBALTA) 20 MG capsule Take 2 capsules (40 mg total) by mouth daily. (Patient not taking: Reported on 08/29/2022) 60 capsule 3   ibuprofen (ADVIL) 600 MG tablet Take 600 mg by mouth every 6 (six) hours as needed (pain).     KLOR-CON M20 20 MEQ tablet TAKE 1 TABLET BY MOUTH DAILY 30 tablet 0   KRILL OIL PO Take 1 capsule by mouth every morning.  morphine (MS CONTIN) 15 MG 12 hr tablet Take 1 tablet (15 mg total) by mouth every 12 (twelve) hours. 15 tablet 0   multivitamin-lutein (OCUVITE-LUTEIN) CAPS capsule Take 1 capsule by mouth every morning.     predniSONE (DELTASONE) 5 MG tablet Take 5 mg by mouth daily with breakfast.     traMADol (ULTRAM) 50 MG tablet Take 50 mg by mouth every 6 (six) hours as needed (pain).     vitamin B-12 (CYANOCOBALAMIN) 1000 MCG tablet Take 1,000 mcg by mouth every morning.     No current facility-administered medications for this visit.     Allergies:  Allergies  Allergen  Reactions   Gabapentin Other (See Comments)    Restless legs/confusion   Celebrex [Celecoxib] Rash      Physical Exam:   Blood pressure 124/61, pulse 89, temperature 98.1 F (36.7 C), temperature source Temporal, resp. rate 16, height '5\' 8"'$  (1.727 m), weight 162 lb 8 oz (73.7 kg), SpO2 98 %.    ECOG: 1    General appearance: Comfortable appearing without any discomfort Head: Normocephalic without any trauma Oropharynx: Mucous membranes are moist and pink without any thrush or ulcers. Eyes: Pupils are equal and round reactive to light. Lymph nodes: No cervical, supraclavicular, inguinal or axillary lymphadenopathy.   Heart:regular rate and rhythm.  S1 and S2 without leg edema. Lung: Clear without any rhonchi or wheezes.  No dullness to percussion. Abdomin: Soft, nontender, nondistended with good bowel sounds.  No hepatosplenomegaly. Musculoskeletal: No joint deformity or effusion.  Full range of motion noted. Neurological: No deficits noted on motor, sensory and deep tendon reflex exam. Skin: No petechial rash or dryness.  Appeared moist.  Psychiatric: Mood and affect appeared appropriate.     Lab Results: Lab Results  Component Value Date   WBC 4.6 08/29/2022   HGB 11.3 (L) 08/29/2022   HCT 34.4 (L) 08/29/2022   MCV 103.3 (H) 08/29/2022   PLT 183 08/29/2022     Chemistry      Component Value Date/Time   NA 138 08/29/2022 0414   NA 140 09/14/2021 0920   K 3.8 08/29/2022 0414   CL 109 08/29/2022 0414   CO2 24 08/29/2022 0414   BUN 17 08/29/2022 0414   BUN 18 09/14/2021 0920   CREATININE 0.64 08/29/2022 0414   CREATININE 0.75 08/06/2022 1040      Component Value Date/Time   CALCIUM 8.3 (L) 08/29/2022 0414   ALKPHOS 44 08/29/2022 0414   AST 28 08/29/2022 0414   AST 14 (L) 08/06/2022 1040   ALT 25 08/29/2022 0414   ALT 7 08/06/2022 1040   BILITOT 1.2 08/29/2022 0414   BILITOT 1.3 (H) 08/06/2022 1040       IMPRESSION: Tiny sub-cm pulmonary metastases  in left upper and lower lobes.   Radiotracer uptake at site of multiple sclerotic bone metastases in the pelvis. Other sclerotic bone metastases show no radiotracer uptake.   No evidence of soft tissue metastatic disease within the abdomen or pelvis.     Latest Reference Range & Units 05/23/22 13:32 07/18/22 11:01 08/06/22 10:40  Prostate Specific Ag, Serum 0.0 - 4.0 ng/mL <0.1 <0.1 0.2       Impression and Plan:  76 year old with:  1.  Advanced prostate cancer with disease to the bone and pulmonary involvement in January 2023.     His disease status was updated at this time and treatment choices were reviewed.  PSMA PET scan obtained in August 2023 showed overall no evidence  of disease progression or visceral metastasis.  He continues to have skeletal metastasis which was noted.  His PSA continues to be reasonably low and under overall control.  Different salvage therapy options were discussed including Taxotere chemotherapy versus a PARP inhibitor if he harbors appropriate mutation.  After discussion today, I recommended continue to hold Zytiga and also discontinue prednisone.  Prednisone could be responsible for some of his GI symptoms.  I will obtain a Guardant360 analysis with the next visit to determine whether he would be a candidate for PARP inhibitor.     2.  Bone directed therapy: He continues to be on Xgeva and will be repeated in the next visit.  His calcium is low and likely will hold the injection.     3.  Parkinson's disease: He is currently on Sinemet and followed by neurology.  4.  Androgen deprivation therapy: I recommended continuing Eligard indefinitely.  He will receive injection today and repeated in 4 months.  Complication clinic weight gain hot flashes among others were reviewed.   5.  Left-sided pelvic pain: Currently receiving radiation therapy.  His pain is manageable.   6.  Prognosis and goals of care: Therapy is palliative with his disease is  incurable.  He still desires aggressive measures currently but open to changing his goals of care.  7.  Lower urinary tract symptoms: He is currently on Cipro for urinary tract infection and will make the appropriate referrals to urology given the chronicity of the symptoms.  8.  GI complaints: These include abdominal pain and dyspepsia.  We will make a GI referral for evaluation and possible endoscopy.   9.  Follow-up: he will return in 2 weeks for a follow-up.       30  minutes were dedicated to this encounter.  The time was spent on reviewing laboratory data, disease status update and outlining future plan of care discussion.   Zola Button, MD 9/25/20238:33 AM

## 2022-09-18 ENCOUNTER — Other Ambulatory Visit: Payer: Self-pay

## 2022-09-18 ENCOUNTER — Encounter: Payer: Self-pay | Admitting: Urology

## 2022-09-18 ENCOUNTER — Ambulatory Visit
Admission: RE | Admit: 2022-09-18 | Discharge: 2022-09-18 | Disposition: A | Discharge status: Home / Self Care | Payer: Medicare Other | Source: Ambulatory Visit | Attending: Radiation Oncology | Admitting: Radiation Oncology

## 2022-09-18 DIAGNOSIS — Z510 Encounter for antineoplastic radiation therapy: Secondary | ICD-10-CM | POA: Diagnosis not present

## 2022-09-18 LAB — RAD ONC ARIA SESSION SUMMARY
Course Elapsed Days: 13
Plan Fractions Treated to Date: 10
Plan Prescribed Dose Per Fraction: 3 Gy
Plan Total Fractions Prescribed: 10
Plan Total Prescribed Dose: 30 Gy
Reference Point Dosage Given to Date: 30 Gy
Reference Point Session Dosage Given: 3 Gy
Session Number: 10

## 2022-09-18 LAB — LIPID PANEL
Chol/HDL Ratio: 3.5 ratio (ref 0.0–5.0)
Cholesterol, Total: 163 mg/dL (ref 100–199)
HDL: 46 mg/dL (ref >39)
LDL Chol Calc (NIH): 98 mg/dL (ref 0–99)
Triglycerides: 103 mg/dL (ref 0–149)
VLDL Cholesterol Cal: 19 mg/dL (ref 5–40)

## 2022-09-18 LAB — URINE CULTURE: Culture: NO GROWTH

## 2022-09-19 ENCOUNTER — Other Ambulatory Visit: Payer: Self-pay | Admitting: Nurse Practitioner

## 2022-09-19 ENCOUNTER — Telehealth: Payer: Self-pay | Admitting: *Deleted

## 2022-09-19 LAB — PROSTATE-SPECIFIC AG, SERUM (LABCORP): Prostate Specific Ag, Serum: 0.5 ng/mL (ref 0.0–4.0)

## 2022-09-19 NOTE — Telephone Encounter (Signed)
-----   Message from Wyatt Portela, MD sent at 09/19/2022  8:50 AM EDT ----- Please let him know his PSA is up slightly

## 2022-09-19 NOTE — Telephone Encounter (Signed)
PC to patient, no answer, left VM - informed him his PSA is 0.5, instructed patient to call this office with any questions/concerns, [phone].

## 2022-09-20 ENCOUNTER — Inpatient Hospital Stay: Payer: Medicare Other | Admitting: General Practice

## 2022-09-20 NOTE — Progress Notes (Signed)
[place] Spiritual Care Note  Met with [name] in my office as planned. He shared life review, investigating key themes through the lens of his cancer treatment and priorities.   Provided reflective listening, affirmation of feelings, and pastoral reflection.   We plan to follow up in person on Wednesday 10/4 at [place], [place], [facility] Pager [phone] Voicemail [phone]

## 2022-09-21 ENCOUNTER — Encounter: Payer: Self-pay | Admitting: Gastroenterology

## 2022-09-24 NOTE — Progress Notes (Signed)
Pt is scheduled with Dr. Diona Fanti @ [facility] on 10/10/2022 @ 8:30am to be evaluated for his nocturia and frequency.    RN left voicemail with patient updating on appointment date/time.

## 2022-09-26 ENCOUNTER — Inpatient Hospital Stay: Payer: Medicare Other | Attending: Nurse Practitioner | Admitting: General Practice

## 2022-09-26 ENCOUNTER — Other Ambulatory Visit: Payer: Self-pay

## 2022-09-26 DIAGNOSIS — Z79899 Other long term (current) drug therapy: Secondary | ICD-10-CM | POA: Insufficient documentation

## 2022-09-26 DIAGNOSIS — G893 Neoplasm related pain (acute) (chronic): Secondary | ICD-10-CM

## 2022-09-26 DIAGNOSIS — Z515 Encounter for palliative care: Secondary | ICD-10-CM

## 2022-09-26 DIAGNOSIS — Z87440 Personal history of urinary (tract) infections: Secondary | ICD-10-CM | POA: Insufficient documentation

## 2022-09-26 DIAGNOSIS — C7951 Secondary malignant neoplasm of bone: Secondary | ICD-10-CM

## 2022-09-26 DIAGNOSIS — G20A1 Parkinson's disease without dyskinesia, without mention of fluctuations: Secondary | ICD-10-CM | POA: Insufficient documentation

## 2022-09-26 DIAGNOSIS — C61 Malignant neoplasm of prostate: Secondary | ICD-10-CM | POA: Insufficient documentation

## 2022-09-26 MED ORDER — MORPHINE SULFATE ER 15 MG PO TBCR: 15.0000 mg | EXTENDED_RELEASE_TABLET | Freq: Two times a day (BID) | ORAL | 0 refills | Status: DC

## 2022-09-26 NOTE — Progress Notes (Deleted)
[place] Spiritual Care Note  Visited with [name] for one hour in [place] office as planned. He is processing personal needs and priorities, as well as treatment discernment, in the setting of his health situation.   Provided empathic listening and emotional support. We plan to follow up in [place] office on Thursday 10/12 at [place], [place], [facility] Pager [phone] Voicemail [phone]

## 2022-09-26 NOTE — Progress Notes (Signed)
[place] Spiritual Care Note   Visited with [name] for one hour in [place] office as planned. He is processing personal needs and priorities, as well as treatment discernment, in the setting of his health situation.    Provided empathic listening and emotional support. We plan to follow up in [place] office on Thursday 10/12 at [place], [place], [facility] Pager [phone] Voicemail [phone]

## 2022-09-27 ENCOUNTER — Encounter: Payer: Self-pay | Admitting: Gastroenterology

## 2022-10-01 ENCOUNTER — Inpatient Hospital Stay: Payer: Medicare Other

## 2022-10-01 ENCOUNTER — Other Ambulatory Visit: Payer: Self-pay

## 2022-10-01 DIAGNOSIS — C7951 Secondary malignant neoplasm of bone: Secondary | ICD-10-CM

## 2022-10-01 NOTE — Progress Notes (Signed)
[place] CSW Progress Note  Clinical Education officer, museum contacted patient by phone to discuss "Coping with Pain" counseling series.  Patient was in agreement and will attend the first session on 10/12/22 at [place], Nurse Navigator referred patient.  He stated he has Parkinson's disease and it is difficult to use the phone.  Provided education regarding the four part counseling series.  CSW provided contact information.    Rodman Pickle Izik Bingman, LCSW

## 2022-10-02 ENCOUNTER — Inpatient Hospital Stay: Payer: Medicare Other

## 2022-10-02 DIAGNOSIS — C61 Malignant neoplasm of prostate: Secondary | ICD-10-CM

## 2022-10-02 DIAGNOSIS — Z87440 Personal history of urinary (tract) infections: Secondary | ICD-10-CM | POA: Diagnosis not present

## 2022-10-02 DIAGNOSIS — C7951 Secondary malignant neoplasm of bone: Secondary | ICD-10-CM | POA: Diagnosis present

## 2022-10-02 DIAGNOSIS — G20A1 Parkinson's disease without dyskinesia, without mention of fluctuations: Secondary | ICD-10-CM | POA: Diagnosis not present

## 2022-10-02 DIAGNOSIS — Z79899 Other long term (current) drug therapy: Secondary | ICD-10-CM | POA: Diagnosis not present

## 2022-10-02 DIAGNOSIS — Z515 Encounter for palliative care: Secondary | ICD-10-CM

## 2022-10-02 LAB — IRON AND IRON BINDING CAPACITY (CC-WL,HP ONLY)
Iron: 36 ug/dL — ABNORMAL LOW (ref 45–182)
Saturation Ratios: 13 % — ABNORMAL LOW (ref 17.9–39.5)
TIBC: 274 ug/dL (ref 250–450)
UIBC: 238 ug/dL (ref 117–376)

## 2022-10-02 LAB — CMP (CANCER CENTER ONLY)
ALT: <5 U/L (ref 0–44)
AST: 14 U/L — ABNORMAL LOW (ref 15–41)
Albumin: 4.1 g/dL (ref 3.5–5.0)
Alkaline Phosphatase: 49 U/L (ref 38–126)
Anion gap: 4 — ABNORMAL LOW (ref 5–15)
BUN: 15 mg/dL (ref 8–23)
CO2: 27 mmol/L (ref 22–32)
Calcium: 9.1 mg/dL (ref 8.9–10.3)
Chloride: 107 mmol/L (ref 98–111)
Creatinine: 0.81 mg/dL (ref 0.61–1.24)
GFR, Estimated: >60 mL/min (ref >60)
Glucose, Bld: 99 mg/dL (ref 70–99)
Potassium: 4.2 mmol/L (ref 3.5–5.1)
Sodium: 138 mmol/L (ref 135–145)
Total Bilirubin: 0.6 mg/dL (ref 0.3–1.2)
Total Protein: 7 g/dL (ref 6.5–8.1)

## 2022-10-02 LAB — URINALYSIS, COMPLETE (UACMP) WITH MICROSCOPIC
Bilirubin Urine: NEGATIVE
Glucose, UA: NEGATIVE mg/dL
Hgb urine dipstick: NEGATIVE
Ketones, ur: NEGATIVE mg/dL
Nitrite: NEGATIVE
Protein, ur: NEGATIVE mg/dL
Specific Gravity, Urine: 1.009 (ref 1.005–1.030)
pH: 7 (ref 5.0–8.0)

## 2022-10-02 LAB — CBC WITH DIFFERENTIAL (CANCER CENTER ONLY)
Abs Immature Granulocytes: 0 10*3/uL (ref 0.00–0.07)
Basophils Absolute: 0 10*3/uL (ref 0.0–0.1)
Basophils Relative: 1 %
Eosinophils Absolute: 0.1 10*3/uL (ref 0.0–0.5)
Eosinophils Relative: 4 %
HCT: 33.5 % — ABNORMAL LOW (ref 39.0–52.0)
Hemoglobin: 11.2 g/dL — ABNORMAL LOW (ref 13.0–17.0)
Immature Granulocytes: 0 %
Lymphocytes Relative: 22 %
Lymphs Abs: 0.8 10*3/uL (ref 0.7–4.0)
MCH: 33.5 pg (ref 26.0–34.0)
MCHC: 33.4 g/dL (ref 30.0–36.0)
MCV: 100.3 fL — ABNORMAL HIGH (ref 80.0–100.0)
Monocytes Absolute: 0.4 10*3/uL (ref 0.1–1.0)
Monocytes Relative: 11 %
Neutro Abs: 2.3 10*3/uL (ref 1.7–7.7)
Neutrophils Relative %: 62 %
Platelet Count: 237 10*3/uL (ref 150–400)
RBC: 3.34 MIL/uL — ABNORMAL LOW (ref 4.22–5.81)
RDW: 13.2 % (ref 11.5–15.5)
WBC Count: 3.7 10*3/uL — ABNORMAL LOW (ref 4.0–10.5)
nRBC: 0 % (ref 0.0–0.2)

## 2022-10-03 ENCOUNTER — Telehealth: Payer: Self-pay

## 2022-10-03 LAB — PROSTATE-SPECIFIC AG, SERUM (LABCORP): Prostate Specific Ag, Serum: 0.3 ng/mL (ref 0.0–4.0)

## 2022-10-03 NOTE — Telephone Encounter (Signed)
Called pt per MD to give results. Pt verbalized thanks and understanding. He knows to call with any further questions or concerns.

## 2022-10-03 NOTE — Telephone Encounter (Signed)
-----   Message from Wyatt Portela, MD sent at 10/03/2022  8:35 AM EDT ----- Please let him know his PSA is down

## 2022-10-04 ENCOUNTER — Inpatient Hospital Stay (HOSPITAL_BASED_OUTPATIENT_CLINIC_OR_DEPARTMENT_OTHER): Payer: Medicare Other | Admitting: Oncology

## 2022-10-04 ENCOUNTER — Encounter: Payer: Self-pay | Admitting: General Practice

## 2022-10-04 ENCOUNTER — Inpatient Hospital Stay: Payer: Medicare Other

## 2022-10-04 ENCOUNTER — Encounter: Payer: Self-pay | Admitting: Nurse Practitioner

## 2022-10-04 ENCOUNTER — Inpatient Hospital Stay (HOSPITAL_BASED_OUTPATIENT_CLINIC_OR_DEPARTMENT_OTHER): Payer: Medicare Other | Admitting: Nurse Practitioner

## 2022-10-04 VITALS — BP 115/60 | HR 89 | Temp 97.7°F | Resp 15 | Ht 68.0 in | Wt 166.7 lb

## 2022-10-04 DIAGNOSIS — N390 Urinary tract infection, site not specified: Secondary | ICD-10-CM

## 2022-10-04 DIAGNOSIS — Z515 Encounter for palliative care: Secondary | ICD-10-CM

## 2022-10-04 DIAGNOSIS — R530 Neoplastic (malignant) related fatigue: Secondary | ICD-10-CM

## 2022-10-04 DIAGNOSIS — C61 Malignant neoplasm of prostate: Secondary | ICD-10-CM

## 2022-10-04 DIAGNOSIS — C7951 Secondary malignant neoplasm of bone: Secondary | ICD-10-CM

## 2022-10-04 DIAGNOSIS — G893 Neoplasm related pain (acute) (chronic): Secondary | ICD-10-CM | POA: Diagnosis not present

## 2022-10-04 LAB — URINE CULTURE: Culture: >=100000 — AB

## 2022-10-04 MED ORDER — MORPHINE SULFATE ER 15 MG PO TBCR: 15.0000 mg | EXTENDED_RELEASE_TABLET | Freq: Three times a day (TID) | ORAL | 0 refills | Status: DC

## 2022-10-04 MED ORDER — NITROFURANTOIN MONOHYD MACRO 100 MG PO CAPS: 100.0000 mg | ORAL_CAPSULE | Freq: Two times a day (BID) | ORAL | 0 refills | Status: DC

## 2022-10-04 MED ORDER — DENOSUMAB 120 MG/1.7ML ~~LOC~~ SOLN
120.0000 mg | Freq: Once | SUBCUTANEOUS | Status: AC
  Administered 2022-10-04: 120 mg via SUBCUTANEOUS
  Filled 2022-10-04: qty 1.7

## 2022-10-04 NOTE — Progress Notes (Signed)
[place] Spiritual Care Note  Met with [name] as planned. He is processing what quality of life means to him, particularly exploring the theme of "satisfaction": What can/does satisfaction look and feel like in this phase of his life?  Provided empathic listening and questions for reflection/discernment. We plan to follow up on Friday 10/20 at [place], [place], [facility] Pager [phone] Voicemail [phone]

## 2022-10-04 NOTE — Progress Notes (Signed)
Hematology and Oncology Follow Up Visit  Frederick Ponce [id number] [birth year] 76 y.o. 10/04/2022 9:01 AM Frederick Ponce, [facility]   Principle Diagnosis: 76 year old man with advanced prostate cancer with disease to the bone and pulmonary nodules diagnosed in January 2023.  He has castration-sensitive after presenting with PSA of 22.5 at the time of diagnosis.   Prior Therapy: CT-guided biopsy of a pulmonary nodule obtained on January 15, 2022 which confirmed the presence of prostate cancer.  He is status post radiation therapy to the left sacroiliac pelvic bones completed in February 2023.  He received 30 Gray in 10 fractions  Firmagon 240 mg started on January 23, 2022.  Last injection was given on February 20, 2022.  He is status post radiation therapy to left pelvis completing 30 Gray in 10 fractions in September 2023.  Current therapy:  Eligard 30 mg every 4 months started on March 20, 2022.  Last injection was given in August 2023.  Zytiga 1000 mg daily with prednisone 5 mg daily started on March 06, 2022.  Therapy currently on hold.  Xgeva 120 mg every 2 months.  This was given in August 2023.     Interim History: Frederick Ponce presents today for a repeat follow-up.  Since the last visit, he reports no major changes in his health.  He reports his GI symptoms has improved including dyspepsia and abdominal discomfort although still persist.  He has follow-up with GI in the next 2 weeks.  He reported his hip pain is also improved after radiation.  His quality of life still marginal but slightly improved.  He continues to hold Zytiga due to side effects.    Medications: Reviewed without changes. Current Outpatient Medications  Medication Sig Dispense Refill   abiraterone acetate (ZYTIGA) 250 MG tablet Take 4 tablets (1,000 mg total) by mouth daily. Take on an empty stomach 1 hour before or 2 hours after a meal 120 tablet 0   amLODipine (NORVASC) 5 MG tablet  Take 1 tablet (5 mg total) by mouth daily. 90 tablet 1   atorvastatin (LIPITOR) 40 MG tablet Take 1 tablet (40 mg total) by mouth every morning. 90 tablet 1   calcium-vitamin D (OSCAL WITH D) 500-5 MG-MCG tablet Take 1 tablet by mouth 2 (two) times daily. 60 tablet 3   carbidopa-levodopa (SINEMET) 25-100 MG tablet Take 1 tablet by mouth 3 (three) times daily. At 7 AM, 11AM, and 3PM. (Patient taking differently: Take 1 tablet by mouth 3 (three) times daily.) 270 tablet 3   ciprofloxacin (CIPRO) 500 MG tablet Take 1 tablet (500 mg total) by mouth 2 (two) times daily. 14 tablet 0   COENZYME Q10 PO Take 1 capsule by mouth every morning.     DULoxetine (CYMBALTA) 20 MG capsule Take 2 capsules (40 mg total) by mouth daily. (Patient not taking: Reported on 09/17/2022) 60 capsule 3   ibuprofen (ADVIL) 600 MG tablet Take 600 mg by mouth every 6 (six) hours as needed (pain).     KLOR-CON M20 20 MEQ tablet TAKE 1 TABLET BY MOUTH DAILY 30 tablet 0   KRILL OIL PO Take 1 capsule by mouth every morning.     morphine (MS CONTIN) 15 MG 12 hr tablet Take 1 tablet (15 mg total) by mouth every 12 (twelve) hours. 15 tablet 0   multivitamin-lutein (OCUVITE-LUTEIN) CAPS capsule Take 1 capsule by mouth every morning.     predniSONE (DELTASONE) 5 MG tablet Take 5 mg by mouth daily with breakfast.  traMADol (ULTRAM) 50 MG tablet Take 50 mg by mouth every 6 (six) hours as needed (pain).     vitamin B-12 (CYANOCOBALAMIN) 1000 MCG tablet Take 1,000 mcg by mouth every morning.     No current facility-administered medications for this visit.     Allergies:  Allergies  Allergen Reactions   Gabapentin Other (See Comments)    Restless legs/confusion   Celebrex [Celecoxib] Rash      Physical Exam:    Blood pressure 115/60, pulse 89, temperature 97.7 F (36.5 C), temperature source Temporal, resp. rate 15, height '5\' 8"'$  (1.727 m), weight 166 lb 11.2 oz (75.6 kg), SpO2 100 %.    ECOG: 1   General appearance:  Alert, awake without any distress. Head: Atraumatic without abnormalities Oropharynx: Without any thrush or ulcers. Eyes: No scleral icterus. Lymph nodes: No lymphadenopathy noted in the cervical, supraclavicular, or axillary nodes Heart:regular rate and rhythm, without any murmurs or gallops.   Lung: Clear to auscultation without any rhonchi, wheezes or dullness to percussion. Abdomin: Soft, nontender without any shifting dullness or ascites. Musculoskeletal: No clubbing or cyanosis. Neurological: No motor or sensory deficits. Skin: No rashes or lesions.      Lab Results: Lab Results  Component Value Date   WBC 3.7 (L) 10/02/2022   HGB 11.2 (L) 10/02/2022   HCT 33.5 (L) 10/02/2022   MCV 100.3 (H) 10/02/2022   PLT 237 10/02/2022     Chemistry      Component Value Date/Time   NA 138 10/02/2022 1549   NA 140 09/14/2021 0920   K 4.2 10/02/2022 1549   CL 107 10/02/2022 1549   CO2 27 10/02/2022 1549   BUN 15 10/02/2022 1549   BUN 18 09/14/2021 0920   CREATININE 0.81 10/02/2022 1549      Component Value Date/Time   CALCIUM 9.1 10/02/2022 1549   ALKPHOS 49 10/02/2022 1549   AST 14 (L) 10/02/2022 1549   ALT <5 10/02/2022 1549   BILITOT 0.6 10/02/2022 1549      .        Impression and Plan:  76 year old with:  1.  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     His disease status was updated again and treatment choices were reviewed.  His PSA continues to be relatively low and not rapidly rising.  Salvage therapy options including PARP inhibitor versus chemotherapy were discussed.  At this time, I advised to continue to hold Zytiga given the side effects and potential rise in his PSA.  Different salvage therapy option will be instituted if his PSA rises in the next 6 to 8 weeks.    2.  Bone directed therapy: His calcium level is adequate and I recommended proceeding with Xgeva.   Complications including osteonecrosis of the jaw and hypocalcemia were reviewed.  He is agreeable to proceed.     3.  Parkinson's disease: He continues to follow with neurology without any recent exacerbation.  4.  Androgen deprivation therapy: This will be repeated in December 2023.   5.  Bone pain: He has reported more arthralgias and increased his morphine to 3 times a day which is reasonable to continue at this time.   6.  Prognosis and goals of care: This continue to be addressed periodically and frequently.  He knows he has disease that is incurable although adequately palliated with limited disease progression.  7.  Lower urinary tract symptoms: Referral to urology made at  this time.  His urinary symptoms persist but his UTI has resolved.  8.  GI complaints: He has follow-up with GI in the next 2 weeks.   9.  Follow-up: In 2 months for a follow-up visit.       30  minutes were spent on this visit.  The time was dedicated to reviewing disease status, laboratory data testing addressing complication related to cancer and cancer therapy.  Zola Button, MD 10/12/20239:01 AM

## 2022-10-04 NOTE — Progress Notes (Signed)
[place]  Telephone:([phone]) [phone] Fax:([phone]) [phone]   Name: Frederick Ponce Date: 10/04/2022 MRN: [id number]  DOB: Oct 25, 1946  Patient Care Team: [name], [place] as PCP - General (General Practice) Pickenpack-Cousar, Carlena Sax, NP as Nurse Practitioner (Nurse Practitioner)     REASON FOR CONSULTATION: Frederick Ponce is a 76 y.o. male with medical history including Parkinson's disease and hyperlipidemia. 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 He is having urgent radiation appointment due to iliac bone pain. Palliative ask to see for symptom management.    SOCIAL HISTORY:     reports that he has never smoked. He has never used smokeless tobacco. He reports current alcohol use of about 14.0 standard drinks of alcohol per week. He reports that he does not use drugs.  ADVANCE DIRECTIVES:  Patient is scheduled to complete advanced directives at our clinic here at the cancer center on 02/16/2022. MOST form provided.  Patient plans to review and complete at a later time.   CODE STATUS:   PAST MEDICAL HISTORY: Past Medical History:  Diagnosis Date   Arthritis    Depression    GERD (gastroesophageal reflux disease)    Hyperlipidemia    Hypertension    Parkinson disease    Prostate cancer ([place])     PAST SURGICAL HISTORY: History reviewed. No pertinent surgical history.  HEMATOLOGY/ONCOLOGY HISTORY:  Oncology History  Prostate 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 ([place])  01/18/2022 Initial Diagnosis   Prostate 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 ([place])   02/20/2022 - 02/20/2022 Chemotherapy   Patient is on Treatment Plan : PROSTATE Docetaxel q21d     05/23/2022 Cancer Staging   Staging form: Prostate, AJCC 8th Edition - Clinical: Stage IVB (cTX, cNX, cM1c) - Signed by Wyatt Portela, MD on 05/23/2022     ALLERGIES:  is allergic to  gabapentin and celebrex [celecoxib].  MEDICATIONS:  Current Outpatient Medications  Medication Sig Dispense Refill   abiraterone acetate (ZYTIGA) 250 MG tablet Take 4 tablets (1,000 mg total) by mouth daily. Take on an empty stomach 1 hour before or 2 hours after a meal 120 tablet 0   amLODipine (NORVASC) 5 MG tablet Take 1 tablet (5 mg total) by mouth daily. 90 tablet 1   atorvastatin (LIPITOR) 40 MG tablet Take 1 tablet (40 mg total) by mouth every morning. 90 tablet 1   calcium-vitamin D (OSCAL WITH D) 500-5 MG-MCG tablet Take 1 tablet by mouth 2 (two) times daily. 60 tablet 3   carbidopa-levodopa (SINEMET) 25-100 MG tablet Take 1 tablet by mouth 3 (three) times daily. At 7 AM, 11AM, and 3PM. (Patient taking differently: Take 1 tablet by mouth 3 (three) times daily.) 270 tablet 3   ciprofloxacin (CIPRO) 500 MG tablet Take 1 tablet (500 mg total) by mouth 2 (two) times daily. 14 tablet 0   COENZYME Q10 PO Take 1 capsule by mouth every morning.     DULoxetine (CYMBALTA) 20 MG capsule Take 2 capsules (40 mg total) by mouth daily. (Patient not taking: Reported on 09/17/2022) 60 capsule 3   ibuprofen (ADVIL) 600 MG tablet Take 600 mg by mouth every 6 (six) hours as needed (pain).     KLOR-CON M20 20 MEQ tablet TAKE 1 TABLET BY MOUTH DAILY 30 tablet 0   KRILL OIL PO Take 1 capsule by mouth every morning.     morphine (MS CONTIN) 15 MG 12  hr tablet Take 1 tablet (15 mg total) by mouth every 8 (eight) hours. 90 tablet 0   multivitamin-lutein (OCUVITE-LUTEIN) CAPS capsule Take 1 capsule by mouth every morning.     nitrofurantoin, macrocrystal-monohydrate, (MACROBID) 100 MG capsule Take 1 capsule (100 mg total) by mouth 2 (two) times daily. 14 capsule 0   predniSONE (DELTASONE) 5 MG tablet Take 5 mg by mouth daily with breakfast.     vitamin B-12 (CYANOCOBALAMIN) 1000 MCG tablet Take 1,000 mcg by mouth every morning.     No current facility-administered medications for this visit.    Facility-Administered Medications Ordered in Other Visits  Medication Dose Route Frequency Provider Last Rate Last Admin   denosumab (XGEVA) injection 120 mg  120 mg Subcutaneous Once Shadad, Mathis Dad, MD        PERFORMANCE STATUS (ECOG) : 1 - Symptomatic but completely ambulatory   IMPRESSION:  Frederick Ponce and his husband presented to clinic today for follow-up. Is feeling somewhat better. Pain in his legs has eased up. He was seen by Dr. Alen Blew today with plans to hold Zytiga due to possible side effects. Also discussed current pain regimen.   He has been taking his MS Contin three times daily with improvement in his pain. Would like to continue with this regimen. Actively involved in counseling which he is appreciative of. Some ongoing complaints of recurrent dysuria. Urinalysis and culture resulted E. Coli with moderate bacteria. Previous UA was clear. We discussed use of antibiotics. He has a scheduled appointment next week with the Urologist.   Emotional support provided. [name] and [name] are concerned about his health and well being. We discussed ongoing support. He is trying to decide if he would want to undergo chemotherapy or other treatment options if recommended. Education provided on prostate support group as he and [name] express interest in speaking with others going thru similar situations. They are aware of the dates and registration for prostate support group.   We will continue to closely follow.   Constipation in the setting of MS Contin. Encouraged continued use of Miralax daily. He knows if no bowel movement within 48hrs he may increase to twice daily.    PLAN: MS Contin 15 mg  three times daily (increased) Tylenol extra strength 3 times a day as needed Cymbalta 20 mg daily Prune juice, tea, MiraLAX daily for bowel regimen Macrobid '100mg'$  twice daily x5 days. Upcoming appointment with Urologist.   We will plan to have phone follow-up in 3-4 weeks for symptom follow-up. I  will see patient back in 6-8 weeks in collaboration to other oncology appointments.   Patient expressed understanding and was in agreement with this plan. He also understands that He can call the clinic at any time with any questions, concerns, or complaints.       Any controlled substances utilized were prescribed in the context of palliative care. PDMP has been reviewed.   Time Total: 40 min   Visit consisted of counseling and education dealing with the complex and emotionally intense issues of symptom management and palliative care in the setting of serious and potentially life-threatening illness.Greater than 50%  of this time was spent counseling and coordinating care related to the above assessment and plan.  Alda Lea, AGPCNP-BC  Palliative Medicine Team/[place]

## 2022-10-05 ENCOUNTER — Encounter: Payer: Self-pay | Admitting: Oncology

## 2022-10-09 ENCOUNTER — Ambulatory Visit (INDEPENDENT_AMBULATORY_CARE_PROVIDER_SITE_OTHER): Payer: Medicare Other | Admitting: Neurology

## 2022-10-09 ENCOUNTER — Encounter: Payer: Self-pay | Admitting: Neurology

## 2022-10-09 VITALS — BP 132/68 | HR 76 | Ht 68.0 in | Wt 163.0 lb

## 2022-10-09 DIAGNOSIS — K5909 Other constipation: Secondary | ICD-10-CM | POA: Diagnosis not present

## 2022-10-09 DIAGNOSIS — C61 Malignant neoplasm of prostate: Secondary | ICD-10-CM | POA: Diagnosis not present

## 2022-10-09 DIAGNOSIS — G893 Neoplasm related pain (acute) (chronic): Secondary | ICD-10-CM

## 2022-10-09 DIAGNOSIS — G20C Parkinsonism, unspecified: Secondary | ICD-10-CM | POA: Diagnosis not present

## 2022-10-09 DIAGNOSIS — C7951 Secondary malignant neoplasm of bone: Secondary | ICD-10-CM

## 2022-10-09 MED ORDER — CARBIDOPA-LEVODOPA 25-100 MG PO TABS: 1.0000 | ORAL_TABLET | Freq: Four times a day (QID) | ORAL | 1 refills | Status: DC

## 2022-10-09 NOTE — Patient Instructions (Signed)
It was nice to see you again today.  I am glad the levodopa has been helpful, you can continue with 1 pill 4 times daily, try to take it about 5 hourly, if possible an hour before any meals.

## 2022-10-09 NOTE — Progress Notes (Signed)
Subjective:    Patient ID: Frederick Ponce is a 76 y.o. male.  HPI    Interim history:   Frederick Ponce is a 76 year old right-handed gentleman with an underlying medical history of hyperlipidemia, metastatic prostate cancer and borderline overweight state, who presents for follow-up consultation of his parkinsonism.  The patient is accompanied by his partner, [name], again today. I last saw him on 06/07/2022, at which time he reported significant increase in stress due to his metastatic prostate cancer diagnosis is in January 2023.  He was getting ready to discuss restarting radiation therapy.  He had radiating low back pain on the left side, thankfully no falls.  His tremor was fluctuating.  He had increased his levodopa to 2 pills daily, and was encouraged to increase it, 1 pill therapy.  His caffeine intake and his alcohol intake, he indicated that he would drink 2 glasses of diluted wine in the evenings.   He called in late August 2023 reporting that he woke up from a nap shaking violently.  He was advised to get checked out in the emergency room, he decided to see his PCP.  Of note, he was hospitalized in the interim from 07/21/2022 through 07/26/2022 with hypoxic respiratory distress and altered mental status, in the context of intentional overdose on tramadol, oxycodone and morphine.[name]  He was treated aggressively with IV fluids primarily and inpatient psychiatry admission was recommended by psychiatry but the patient improved significantly and was referred to psychiatry as an outpatient.   Today, 10/09/2022: He reports doing fairly well with increased levodopa, he increase it about 3 weeks ago to 1 pill 4 times a day.  He feels like he tolerates it, does not particularly cause of any indigestion or nausea but he has had GI trouble in the recent past.  He reports that he had COVID, he went to the emergency room for this.  I reviewed the emergency room records from 08/29/2022.  He presented with fever.   He was treated with molnupiravir.  He had more noticeable tremor after he had COVID and increasing the levodopa was supposed to be just a trial by him but as he had sustained effect from it, he continued with 1 pill 4 times a day.  He generally takes it 6 hourly starting around 6 AM.  He has an appointment pending with urology as well as GI.  Some 6 weeks ago he stopped all his medications except for his heart medication and his levodopa.  He has had 3 rounds of radiation therapy, he is in follow-up with his oncologist on a regular basis.  He does take MS Contin.  He has had intermittent constipation.  Previously:   I first met him on 09/14/2021, at which time he reported a longstanding history of tremors as well as a strong family history of tremors.  Usual combination of propranolol once daily at night and generic Sinemet 1-1/2 pills at night.  He was maintained on his medication regimen, I suggested we proceed with a DaTscan for better diagnostic clarification. He had a DaTscan on 10/24/2021 and I reviewed the results: IMPRESSION: Bilateral reduced activity in posterior striata is a pattern suggestive of Parkinsonian syndrome pathology.   Of note, DaTSCAN is not diagnostic of Parkinsonian syndromes, which remains a clinical diagnosis. DaTscan is an adjuvant test to aid in the clinical diagnosis of Parkinsonian syndromes.   He was notified of the test results.   09/14/2021: (He) reports a longstanding history of bilateral upper extremity tremors.  He reports a family history of tremors affecting his father, paternal grandmother, 2 sisters.  One paternal aunt had Parkinson's disease and was on levodopa therapy.  He is currently on propranolol 40 mg at 1 AM at night.  He also takes carbidopa-levodopa 25-100 mg strength 1-1/2 pills at 1 AM at night, he does not take it during the day.  He reports that it is helping, both medications have helped his tremor and his motor function.  He has not had any recent  falls.  He does not take his medications during the day as he would forget them.  He has not been on a higher dose of levodopa therapy.  He has not had any significant side effects.  He has an appointment with a new primary care physician in the next couple of weeks. I reviewed your office note from 05/11/2019.  He was complaining of pain in his legs and feet at the time.  He was also complaining of back pain he reported a tremor in his right hand which was longstanding for over 10 years.  He also reported a family history of tremors at the time.  He was advised to proceed with additional work-up through your office including a lumbar spine MRI without contrast, ultrasound for ankle brachial indices, EMG and nerve conduction velocity testing of both lower extremities.  He was advised to proceed with blood work including B6, CBC with differential, vitamin D, A1c, vitamin a and E, protein electrophoresis, CMP, methylmalonic acid and homocystine.  Test results are not available for my review today.  He had a follow-up appointment on 11/02/2020 and your office.  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  He had a subsequent appointment on 01/25/2021, at which time he reported that he discontinued gabapentin 300 mg 3 times a day.  He felt very fatigued in the mornings.  He was started on Sinemet 25-100 mg strength 1 pill 3 times daily at the time.  He had an appointment on 06/19/2021, at which time he was on Sinemet 1 pill 3 times a day, 25-100 mg strength.  He had apparently tried gabapentin 100 mg at bedtime but it was no longer on his active list of medications.  He was referred to physical therapy.  He was started on propranolol 40 mg once daily to help with his tremors. We will request test results from your office.  They moved from [place], [place], some 2 weeks ago.  He has relatives close to here.  They have 3 dogs in the household.  His sleep is disrupted.  He tries to hydrate well.  He drinks caffeine in the form  of coffee, 1 cup in the morning.  He drinks alcohol daily in the form of 3 diluted glasses of wine on average.   He reports intermittent numbness in his feet, does not indicate that he has been told to reduce his alcohol intake or a connection between his alcohol consumption and neuropathy symptoms.  He recalls that he could not tolerate gabapentin.   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  A small fiber neuropathy cannot be ruled out on the basis of the study. Blood work from 05/15/2019 showed methylmalonic acid 121, homocystine 12.3, glucose 110, BUN 16, creatinine 0.85, alk phos 68, AST 21, ALT 23, A1c 5.6, protein electrophoresis  benign, CBC with differential and platelets benign, vitamin D 17.6, within range, vitamin B6 18.9, within range, vitamin D 64, within range, vitamin D 46.     MRI brain without contrast from 05/22/2019 showed: Impression: No acute intracranial process.  White matter changes are nonspecific, but most likely related to chronic small vessel ischemic changes given the patient's age.  Generalized cerebral volume loss.  Polyp/mucosal retention cysts within the bilateral maxillary sinuses.  Scattered mucosal thickening of the paranasal sinuses.   He had a lumbar spine MRI without contrast on 05/22/2019 and I reviewed the results: Impression: Patient motion artifact slightly limits evaluation.  Multilevel degenerative changes and mild epidural lipomatosis, worst at L4-L5, where there is mild right and mild to moderate left neuroforaminal narrowing, minimal spinal canal stenosis, lateral recess narrowing, suspected abutment of the bilateral exiting L4 nerve roots and possible abutment of the descending left L5 nerve root.  Please see above for additional details regarding the  individual levels.  Bone marrow edema within the right aspect of the T12-L1 endplate/right aspect of the L1 vertebral body is likely on a degenerative basis/reactive, however, on noncontrasted CT of the lumbar spine may be beneficial to better exclude an underlying lesion within the L1.  Bone marrow is heterogeneous diffusely, nonspecific but likely relating to age-related changes, degenerative changes and/or underlying osteopenia.  Prominence of the right renal pelvis, possibly relating to peri-/parapelvic cysts but incompletely evaluated on this study.  Hydronephrosis is not entirely excluded in the appropriate clinical setting.  Hypointense ovoid focus in this region measuring 1.1 cm, possibly relating to her renal calculus.  Clinical correlation is recommended.  Dedicated imaging is recommended for further evaluation as clinically indicated.    His Past Medical History Is Significant For: Past Medical History:  Diagnosis Date   Arthritis    Depression    GERD (gastroesophageal reflux disease)    Hyperlipidemia    Hypertension    Parkinson disease    Prostate cancer ([place])     His Past Surgical History Is Significant For: History reviewed. No pertinent surgical history.  His Family History Is Significant For: Family History  Problem Relation Age of Onset   Cancer Mother    Kidney disease Mother    Heart disease Father    Tremor Father    Parkinson's disease Father    Melanoma Sister    Parkinson's disease Sister    Cancer Brother    Parkinson's disease Paternal Aunt    Parkinson's disease Paternal Grandmother     His Social History Is Significant For: Social History   Socioeconomic History   Marital status: Married    Spouse name: Brynda Rim   Number of children: 0   Years of education: Not on file   Highest education level: Associate degree: academic program  Occupational History   Not on file  Tobacco Use   Smoking status: Never   Smokeless tobacco: Never  Vaping  Use   Vaping Use: Never used  Substance and Sexual Activity   Alcohol use: Yes    Alcohol/week: 15.0 standard drinks of alcohol    Types: 15 Glasses of wine per week    Comment: couple glasses a night   Drug use: Never   Sexual activity: Not Currently    Birth control/protection: None  Other Topics Concern   Not on file  Social History Narrative   Not on file   Social Determinants of Health   Financial Resource Strain: Not on file  Food Insecurity:  Not on file  Transportation Needs: Not on file  Physical Activity: Not on file  Stress: Not on file  Social Connections: Not on file    His Allergies Are:  Allergies  Allergen Reactions   Gabapentin Other (See Comments)    Restless legs/confusion   Celebrex [Celecoxib] Rash  :   His Current Medications Are:  Outpatient Encounter Medications as of 10/09/2022  Medication Sig   amLODipine (NORVASC) 5 MG tablet Take 1 tablet (5 mg total) by mouth daily.   atorvastatin (LIPITOR) 40 MG tablet Take 1 tablet (40 mg total) by mouth every morning.   calcium-vitamin D (OSCAL WITH D) 500-5 MG-MCG tablet Take 1 tablet by mouth 2 (two) times daily.   carbidopa-levodopa (SINEMET) 25-100 MG tablet Take 1 tablet by mouth 3 (three) times daily. At 7 AM, 11AM, and 3PM. (Patient taking differently: Take 1 tablet by mouth 3 (three) times daily.)   COENZYME Q10 PO Take 1 capsule by mouth every morning.   ibuprofen (ADVIL) 600 MG tablet Take 600 mg by mouth as needed (pain).   KRILL OIL PO Take 1 capsule by mouth every morning.   morphine (MS CONTIN) 15 MG 12 hr tablet Take 1 tablet (15 mg total) by mouth every 8 (eight) hours.   multivitamin-lutein (OCUVITE-LUTEIN) CAPS capsule Take 1 capsule by mouth every morning.   nitrofurantoin, macrocrystal-monohydrate, (MACROBID) 100 MG capsule Take 1 capsule (100 mg total) by mouth 2 (two) times daily.   vitamin B-12 (CYANOCOBALAMIN) 1000 MCG tablet Take 1,000 mcg by mouth every morning.   abiraterone  acetate (ZYTIGA) 250 MG tablet Take 4 tablets (1,000 mg total) by mouth daily. Take on an empty stomach 1 hour before or 2 hours after a meal   ciprofloxacin (CIPRO) 500 MG tablet Take 1 tablet (500 mg total) by mouth 2 (two) times daily.   DULoxetine (CYMBALTA) 20 MG capsule Take 2 capsules (40 mg total) by mouth daily.   KLOR-CON M20 20 MEQ tablet TAKE 1 TABLET BY MOUTH DAILY   predniSONE (DELTASONE) 5 MG tablet Take 5 mg by mouth daily with breakfast.   No facility-administered encounter medications on file as of 10/09/2022.  :  Review of Systems:  Out of a complete 14 point review of systems, all are reviewed and negative with the exception of these symptoms as listed below:  Review of Systems  Neurological:        Pt here for Parkinsonism f/u Pt states he increased his Carbidopa levodopa to 4x daily Pt states less tremors   Pt states was hospitalized 5 days  for attempting suicide     Objective:  Neurological Exam  Physical Exam Physical Examination:   Vitals:   10/09/22 1005  BP: 132/68  Pulse: 76    General Examination: The patient is a very pleasant 76 y.o. male in no acute distress. He appears well-developed and well-nourished and well groomed.   HEENT: Normocephalic, atraumatic, pupils are equal, round and reactive to light, extraocular tracking is fairly well-preserved.  Face is symmetric with mild facial masking, no decrease in eye blink rate.  Neck with mild nuchal rigidity noted.  Hearing is grossly intact.  He has no lip, neck or jaw tremor.      Chest: Clear to auscultation without wheezing, rhonchi or crackles noted.   Heart: S1+S2+0, regular and normal without murmurs, rubs or gallops noted.    Abdomen: Soft, non-tender and non-distended.   Extremities: There is no obvious edema in the distal lower extremities  bilaterally.   Skin: Warm and dry without trophic changes noted.   Musculoskeletal: exam reveals no obvious joint deformities.      Neurologically:  Mental status: The patient is awake, alert and oriented in all 4 spheres. His immediate and remote memory, attention, language skills and fund of knowledge appear to be better today. Thought process is linear. Mood is normal and affect is blunted.   Cranial nerves II - XII are as described above under HEENT exam.  Motor exam: Thin bulk, global strength of 4+ out of 5, no focal weakness, no focal atrophy, has bilateral contractures of the fifth digits and hands, stable.  Tone without significant cogwheeling, but mildly increased in the left upper extremity.  He has an intermittent resting tremor in the left more than right upper extremities.    (On 09/14/2021: On Archimedes spiral drawing he has no significant trembling with the right hand, insecurity and intermittent trembling with the left hand, handwriting is legible, not particularly tremulous or significantly micrographic, on the smaller side.)   Romberg is not tested for safety concerns.   Fine motor skills and coordination: He has mild difficulty.   Cerebellar testing: No dysmetria or intention tremor. There is no truncal or gait ataxia.  Sensory exam: intact to light touch.   Gait, station and balance: He stands without difficulty, he has no walking aid, posture is mildly stooped for age.  He walks slightly slowly and cautiously, mild decrease in arm swing noted.   Assessment and Plan:    In summary, Frederick Ponce is a very pleasant 76 year old male with an underlying medical history of hyperlipidemia, metastatic prostate cancer with status post XRT, and borderline overweight state, who presents for follow-up consultation of his parkinsonism.  He has a history of hand tremors for many years.  He developed signs of parkinsonism.  His DaTscan from November 2022 was supportive of an underlying Parkinsonian syndrome. He has had symptoms for many years affecting both upper extremities, history and family history are supportive  of essential tremor, exam findings do show some features in keeping with parkinsonism with some lateralization to the left.  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  He also has a strong family history of tremors in general.  He increase his metoprolol to 1 pill 4 times a day about 3 weeks ago on his own.  He can continue with this, he is advised to check with [place] before he makes adjustments on his own.  He is advised to take the levodopa away from his mealtimes if possible.  He is in close follow-up with his oncologist and has appointments pending with GI and urology.  He is advised to follow-up in 4 months to see one of our nurse practitioners. I answered all their questions today and the patient and his partner were in agreement. I spent 30 minutes in total face-to-face time and in reviewing records during pre-charting, more than 50% of which was spent in counseling and coordination of care, reviewing test results, reviewing medications and treatment regimen and/or in discussing or reviewing the diagnosis of primary parkinsonism, the prognosis and treatment options. Pertinent laboratory and imaging test results that were available during this visit with the patient were reviewed by me and considered in my medical decision making (see chart for details).

## 2022-10-11 ENCOUNTER — Encounter: Payer: Self-pay | Admitting: Oncology

## 2022-10-12 ENCOUNTER — Inpatient Hospital Stay: Payer: Medicare Other

## 2022-10-12 ENCOUNTER — Inpatient Hospital Stay: Payer: Medicare Other | Admitting: General Practice

## 2022-10-12 ENCOUNTER — Other Ambulatory Visit: Payer: Self-pay

## 2022-10-12 NOTE — Progress Notes (Signed)
[place] Spiritual Care Note  Visited with Mr Fooks for one hour in [place] office as planned. He has recently attended Prostate Cancer Support Group and found the community and learning/sharing opportunities helpful. He is also processing relationships and communication within the context of his energy level, priorities, and hopes.  Provided reflective listening and emotional support.  We plan to follow up next Friday, 10/19/2022, at 9am in chaplain's office.   [place], [place], [facility] Pager [phone] Voicemail [phone]

## 2022-10-12 NOTE — Progress Notes (Signed)
[place] CSW Counseling Note  Patient was referred by nurse navigator for counseling regarding pain. Treatment type: Individual  Presenting Concerns: Patient and/or family reports the following symptoms/concerns: Pain issues with a history of situational depression. Duration of problem: several months; Severity of problem: moderate   Orientation:oriented to person, place, time/date, situation, day of week, month of year, and year.   Affect: Appropriate Risk of harm to self or others: No plan to harm self or others  Patient and/or Family's Strengths/Protective Factors: Social and Emotional competence and Concrete supports in place (healthy food, safe environments, etc.)Capable of independent living  Engineer, drilling fund of knowledge  Supportive family/friends      Goals Addressed: Patient will:  Reduce symptoms of: depression Increase knowledge and/or ability of: stress reduction  Increase healthy adjustment to current life circumstances and Begin healthy grieving over loss of previous abilities.   Progress towards Goals: Initial   Interventions: Interventions utilized:  CBT and Supportive      Assessment: Patient provided a psychosocial history.  He was born and raised in [place].  He has lived in [place], [place] and [place].  He returned to [facility] in September, 2022.  Two of his sisters live in [place] and one sister lives in [place].  He also has a deceased brother.  Patient has been with his husband, Frederick Ponce, for 49 years.  Frederick Ponce reports being diagnosed with Parkinson's five or six years ago.  He expressed having difficulty adjusting to his cancer diagnosis.  Frederick Ponce attempted suicide in July 2023 by overdosing with medication.  He stated the attempt was due to ongoing pain issues.  Frederick Ponce continues to communicate with several friends.  He recently participated in a prostate support group.  CSW provided patient with "Coping With Pain" program information.       Plan: Follow up with CSW: Friday, 10/27, at 10am. Behavioral recommendations: Will introduce guided imagery during next session.    Frederick Pickle Brenly Trawick, LCSW

## 2022-10-19 ENCOUNTER — Inpatient Hospital Stay: Payer: Medicare Other

## 2022-10-19 ENCOUNTER — Inpatient Hospital Stay: Payer: Medicare Other | Admitting: General Practice

## 2022-10-19 NOTE — Progress Notes (Signed)
[facility] Spiritual Care Note  Followed up with Mr Tift in [place] office as planned.  He is continuing to work on personal goals related to life review and present priorities within the context of his health needs and energy levels. Mr Dittus is finding benefit in having a neutral container in which to process his concerns and goals, and is in discernment about what professional context (spiritual care vs counseling, etc) is likely to be the best fit for this work moving forward.  Provided reflective listening and affirmation of strengths, helping to identify and process progress toward goals.  We plan to follow up on Thursday, November 2 at 10:30.   [place], [place], [facility] Pager [phone] Voicemail [phone]

## 2022-10-19 NOTE — Progress Notes (Signed)
[place] CSW Counseling Note  Patient was referred by nurse navigator. Treatment type: Individual  Presenting Concerns: Patient and/or family reports the following symptoms/concerns:  Pain issues with a history of situational depression. Duration of problem: several months.  Severity of problem: moderate   Orientation:oriented to person, place, time/date, situation, day of week, and month of year.   Affect: Appropriate Risk of harm to self or others: No plan to harm self or others  Patient and/or Family's Strengths/Protective Factors: Concrete supports in place (healthy food, safe environments, etc.) and Physical Health (exercise, healthy diet, medication compliance, etc.)Active sense of humor  Capable of independent living  Communication skills  Financial means  General fund of knowledge  Supportive family/friends      Goals Addressed: Patient will:  Reduce symptoms of: depression Increase knowledge and/or ability of: coping skills  Increase healthy adjustment to current life circumstances   Progress towards Goals: Progressing   Interventions: Interventions utilized:  CBT, Strength-based, and Meditation: Initiated guided imagery exercise.       Assessment: Patient currently experiencing overall pain due to a recent fall.  He stated he did not sustain any injuries, but was sore.  He continues to take morphine, which he says is effective.  Patient stated the guided imagery exercise was not effective due to the imagery it evoked.      Plan: Follow up with CSW: Thursday, 11/2, at 9:30.      Rodman Pickle Raeford Brandenburg, LCSW

## 2022-10-23 ENCOUNTER — Telehealth: Payer: Self-pay | Admitting: *Deleted

## 2022-10-23 LAB — GUARDANT 360

## 2022-10-23 NOTE — Telephone Encounter (Signed)
Patient called. Requested results of DNA tests. Advised him MD out of office this afternoon and will send his message to Dr. Alen Blew. He verbalized understanding. Message routed to Dr. Alen Blew

## 2022-10-25 ENCOUNTER — Inpatient Hospital Stay: Payer: Medicare Other | Attending: Nurse Practitioner | Admitting: General Practice

## 2022-10-25 ENCOUNTER — Inpatient Hospital Stay: Payer: Medicare Other

## 2022-10-25 NOTE — Progress Notes (Signed)
[facility] CSW Counseling Note  Patient was referred by nurse navigator. Treatment type: Individual  Presenting Concerns: Patient and/or family reports the following symptoms/concerns:  Pain issues with a history of situaltional depression. Duration of problem: Several months. ; Severity of problem: moderate   Orientation:oriented to person, place, time/date, situation, day of week, month of year, and year.   Affect: Appropriate Risk of harm to self or others: No plan to harm self or others  Patient and/or Family's Strengths/Protective Factors: Concrete supports in place (healthy food, safe environments, etc.) and Physical Health (exercise, healthy diet, medication compliance, etc.)Active sense of humor  Capable of independent living  Communication skills  Financial means  General fund of knowledge  Supportive family/friends      Goals Addressed: Patient will:  Reduce symptoms of: depression Increase knowledge and/or ability of: coping skills  Increase healthy adjustment to current life circumstances.   Progress towards Goals: Progressing   Interventions: Interventions utilized:  CBT and Supportive      Assessment: Patient currently experiencing stress due to receiving information about his genetic testing yesterday.  Patient reflected on the guided imagery exercise last week.  He attempted to recall a happy time/place from his past.  He reminisced about sitting on his grandmother's porch and listening to her stories.        Plan: Follow up with CSW: next week.  Time/Date is pending. Behavioral recommendations: If patient experiences pain or stress, recall the scene with his grandmother.  Patient agreed.       Frederick Pickle Delorus Langwell, LCSW

## 2022-10-25 NOTE — Progress Notes (Signed)
[facility] Spiritual Care Note  Visited with [name] in [place] office for one hour as scheduled. Now that he has some more genetic information from Dr Alen Blew, [name] is processing a lot about what quality of life means to him vis-a-vis potential treatment side effects. In particular, living with uncontrollable diarrhea is something he wants to avoid. [name] describes quality of life as including being able to go out for meals, hosting/enjoying social events, enjoying his evening wine and relaxation ritual with his [name] and their dogs, managing daily life activities independently, etc.   Provided pastoral presence, reflective listening, and assistance with naming/brainstorming questions for his next appointment with Dr Alen Blew.  We plan to follow up on Friday, November 10 at [facility] in [place] office.   [place], [place], [facility] Pager [phone] Voicemail [phone]

## 2022-11-01 ENCOUNTER — Other Ambulatory Visit: Payer: Self-pay

## 2022-11-01 ENCOUNTER — Inpatient Hospital Stay: Payer: Medicare Other

## 2022-11-01 NOTE — Progress Notes (Signed)
[facility] CSW Counseling Note  Patient was referred by nurse navigator. Treatment type: Individual  Presenting Concerns: Patient and/or family reports the following symptoms/concerns: Pain issues with a history of situational depression. Duration of problem: Several months; Severity of problem: moderate   Orientation:oriented to person, place, time/date, situation, day of week, month of year, and year.   Affect: Appropriate Risk of harm to self or others: No plan to harm self or others  Patient and/or Family's Strengths/Protective Factors: Concrete supports in place (healthy food, safe environments, etc.)Capable of independent living  Communication skills  Financial means  General fund of knowledge  Supportive family/friends      Goals Addressed: Patient will:  Reduce symptoms of:  pain Increase knowledge and/or ability of: coping skills  Increase healthy adjustment to current life circumstances   Progress towards Goals: Revised : Guided imagery and meditation were not the tools patient could utilize.  He reported that "his mind is always going."     Interventions: Interventions utilized:  Supportive and Meditation: Progressive Muscle Relaxation  handout.     Assessment: Patient said he initially read the handout on the counseling series "Coping with Pain", but did not read about it lasting four sessions.  Since his sessions consisted of issues much broader than pain and his ability to manage it, patient was referred to a therapist.  He has used progressive muscle relaxation in the past, but no longer utilizes it.       Plan: Follow up with CSW: None Referral(s): Individual therapist, Conception Chancy, PsyD, at [facility] at [place].  CSW made internal referral via [facility].  Patient agreed he would like to continue therapy.       Rodman Pickle [place] Depaz, LCSW

## 2022-11-02 ENCOUNTER — Other Ambulatory Visit: Payer: Self-pay

## 2022-11-02 ENCOUNTER — Inpatient Hospital Stay: Payer: Medicare Other | Admitting: General Practice

## 2022-11-02 DIAGNOSIS — C61 Malignant neoplasm of prostate: Secondary | ICD-10-CM

## 2022-11-02 DIAGNOSIS — G893 Neoplasm related pain (acute) (chronic): Secondary | ICD-10-CM

## 2022-11-02 DIAGNOSIS — N390 Urinary tract infection, site not specified: Secondary | ICD-10-CM

## 2022-11-02 DIAGNOSIS — Z515 Encounter for palliative care: Secondary | ICD-10-CM

## 2022-11-02 MED ORDER — MORPHINE SULFATE ER 15 MG PO TBCR: 15.0000 mg | EXTENDED_RELEASE_TABLET | Freq: Three times a day (TID) | ORAL | 0 refills | Status: DC

## 2022-11-02 NOTE — Telephone Encounter (Signed)
Pt called for pain med refill, see new orders

## 2022-11-02 NOTE — Progress Notes (Signed)
[facility] Spiritual Care Note  Followed up with Frederick Ponce in [facility] office as planned. He is processing the identity question "Who is Marquie Aderhold?" in relation to retirement, diagnoses, relationship, and values and beliefs. This feels particularly challenging for him in light of the lack of "accomplishments" in this phase of his life.  Provided reflective listening and questions  for further reflection, including queries that may be helpful to take to his upcoming psychology appointment with Elias Else.  We plan to follow up on Wednesday 11/15 in order squeeze in a meeting before Thanksgiving and will evaluate further need in light of other support thereafter.   [place], [place], [facility] Pager [phone] Voicemail [phone]

## 2022-11-06 ENCOUNTER — Other Ambulatory Visit: Payer: Medicare Other

## 2022-11-06 ENCOUNTER — Ambulatory Visit (INDEPENDENT_AMBULATORY_CARE_PROVIDER_SITE_OTHER): Payer: Medicare Other | Admitting: Gastroenterology

## 2022-11-06 ENCOUNTER — Encounter: Payer: Self-pay | Admitting: Gastroenterology

## 2022-11-06 VITALS — BP 122/78 | HR 95 | Ht 68.0 in | Wt 166.6 lb

## 2022-11-06 DIAGNOSIS — C7951 Secondary malignant neoplasm of bone: Secondary | ICD-10-CM

## 2022-11-06 DIAGNOSIS — R195 Other fecal abnormalities: Secondary | ICD-10-CM

## 2022-11-06 DIAGNOSIS — G20A1 Parkinson's disease without dyskinesia, without mention of fluctuations: Secondary | ICD-10-CM

## 2022-11-06 DIAGNOSIS — K641 Second degree hemorrhoids: Secondary | ICD-10-CM

## 2022-11-06 DIAGNOSIS — K921 Melena: Secondary | ICD-10-CM | POA: Diagnosis not present

## 2022-11-06 DIAGNOSIS — C61 Malignant neoplasm of prostate: Secondary | ICD-10-CM

## 2022-11-06 MED ORDER — HYDROCORTISONE ACETATE 25 MG RE SUPP: 25.0000 mg | Freq: Every day | RECTAL | 1 refills | Status: DC

## 2022-11-06 NOTE — Progress Notes (Unsigned)
Chief Complaint: Diarrhea, change in bowel habits, hematochezia, hiccups   Referring Provider:     [name], FNP    HPI:     Frederick Ponce is a 76 y.o. male with a history of HLD, metastatic prostate cancer diagnosed in 12/2021 s/p XRT, referred to the Gastroenterology Clinic for evaluation of changes in bowel habits.  Takes morphine for neuralgia. Takes up to QID. Take stool softener 2-3 times/week for the constipation, but last month with large volume loose stool and burnign rectal pain. Since then has been having continued loose stools despite stopping stool softener. Has 2 BM/day and occasional BRB on tissue paper. No hx of hemorrhoids.   Did have 2 courses of Abx for UTI x2 in the last 3-5 weeks, but that was after GI sxs onset.    Radiation proctitis?   Additionally, follows in the Neurology clinic for potential diagnosis of Parkinson's.  Now only taking BP and Parkinsons meds, and pain meds.    Past Medical History:  Diagnosis Date   Arthritis    Depression    GERD (gastroesophageal reflux disease)    Hyperlipidemia    Hypertension    Parkinson disease    Prostate cancer ([place])      History reviewed. No pertinent surgical history. Family History  Problem Relation Age of Onset   Cancer Mother    Kidney disease Mother    Heart disease Father    Tremor Father    Parkinson's disease Father    Melanoma Sister    Parkinson's disease Sister    Cancer Brother    Parkinson's disease Paternal Aunt    Parkinson's disease Paternal Grandmother    Social History   Tobacco Use   Smoking status: Never   Smokeless tobacco: Never  Vaping Use   Vaping Use: Never used  Substance Use Topics   Alcohol use: Yes    Alcohol/week: 15.0 standard drinks of alcohol    Types: 15 Glasses of wine per week    Comment: couple glasses a night   Drug use: Never   Current Outpatient Medications  Medication Sig Dispense Refill   amLODipine (NORVASC) 5 MG  tablet Take 1 tablet (5 mg total) by mouth daily. 90 tablet 1   atorvastatin (LIPITOR) 40 MG tablet Take 1 tablet (40 mg total) by mouth every morning. 90 tablet 1   calcium-vitamin D (OSCAL WITH D) 500-5 MG-MCG tablet Take 1 tablet by mouth 2 (two) times daily. 60 tablet 3   carbidopa-levodopa (SINEMET) 25-100 MG tablet Take 1 tablet by mouth 4 (four) times daily. Take 1 hour before any meals. 360 tablet 1   COENZYME Q10 PO Take 1 capsule by mouth every morning.     ibuprofen (ADVIL) 600 MG tablet Take 600 mg by mouth as needed (pain).     KRILL OIL PO Take 1 capsule by mouth every morning.     morphine (MS CONTIN) 15 MG 12 hr tablet Take 1 tablet (15 mg total) by mouth every 8 (eight) hours. 90 tablet 0   multivitamin-lutein (OCUVITE-LUTEIN) CAPS capsule Take 1 capsule by mouth every morning.     vitamin B-12 (CYANOCOBALAMIN) 1000 MCG tablet Take 1,000 mcg by mouth every morning.     No current facility-administered medications for this visit.   Allergies  Allergen Reactions   Gabapentin Other (See Comments)    Restless legs/confusion   Celebrex [Celecoxib] Rash  Review of Systems: All systems reviewed and negative except where noted in HPI.     Physical Exam:    Wt Readings from Last 3 Encounters:  11/06/22 166 lb 9.6 oz (75.6 kg)  10/09/22 163 lb (73.9 kg)  10/04/22 166 lb 11.2 oz (75.6 kg)    BP 122/78   Pulse 95   Ht '5\' 8"'$  (1.727 m)   Wt 166 lb 9.6 oz (75.6 kg)   SpO2 97%   BMI 25.33 kg/m  Constitutional:  Pleasant, in no acute distress. Psychiatric: Normal mood and affect. Behavior is normal. EENT: Pupils normal.  Conjunctivae are normal. No scleral icterus. Neck supple. No cervical LAD. Cardiovascular: Normal rate, regular rhythm. No edema Pulmonary/chest: Effort normal and breath sounds normal. No wheezing, rales or rhonchi. Abdominal: Soft, nondistended, nontender. Bowel sounds active throughout. There are no masses palpable. No hepatomegaly. Neurological:  Alert and oriented to person place and time. Skin: Skin is warm and dry. No rashes noted.   ASSESSMENT AND PLAN;   1)  [place]  Calpro, GI PCR, elastase If negative, [place], DO, FACG  11/06/2022, 4:09 PM   [name], FNP

## 2022-11-06 NOTE — Patient Instructions (Addendum)
Your provider has requested that you go to the basement level for lab work before leaving today. Press "B" on the elevator. The lab is located at the first door on the left as you exit the elevator.   We have sent the following medications to your pharmacy for you to pick up at your convenience: Anusol Suppository   If you are age 77 or older, your body mass index should be between 23-30. Your Body mass index is 25.33 kg/m. If this is out of the aforementioned range listed, please consider follow up with your Primary Care Provider.  If you are age 42 or younger, your body mass index should be between 19-25. Your Body mass index is 25.33 kg/m. If this is out of the aformentioned range listed, please consider follow up with your Primary Care Provider.   __________________________________________________________  The [facility] providers would like to encourage you to use [facility] to communicate with providers for non-urgent requests or questions.  Due to long hold times on the telephone, sending your provider a message by [facility] may be a faster and more efficient way to get a response.  Please allow 48 business hours for a response.  Please remember that this is for non-urgent requests.   Due to recent changes in healthcare laws, you may see the results of your imaging and laboratory studies on MyChart before your provider has had a chance to review them.  We understand that in some cases there may be results that are confusing or concerning to you. Not all laboratory results come back in the same time frame and the provider may be waiting for multiple results in order to interpret others.  Please give [place] 48 hours in order for your provider to thoroughly review all the results before contacting the office for clarification of your results.    Thank you for choosing me and [facility].  Vito Cirigliano, D.O.

## 2022-11-07 ENCOUNTER — Encounter: Payer: Self-pay | Admitting: General Practice

## 2022-11-07 ENCOUNTER — Encounter: Payer: Self-pay | Admitting: Oncology

## 2022-11-07 ENCOUNTER — Inpatient Hospital Stay: Payer: Medicare Other | Admitting: General Practice

## 2022-11-07 NOTE — Progress Notes (Signed)
[facility] Spiritual Care Note  Met with Frederick Ponce in [place] office as planned. Together we reviewed discussion themes and key questions that have arisen in our sessions in order to make a list of priorities for him to address with psychologist Elias Else at the intake appointment that has been moved up to Friday.   For now, we will defer to the establishment of that new support relationship. Frederick Ponce plans to reach out to chaplain later on in the event that a pastoral check-in would be helpful and appropriately complementary to that other work.    [place], [place], [facility] Pager [phone] Voicemail [phone]

## 2022-11-08 ENCOUNTER — Inpatient Hospital Stay (HOSPITAL_BASED_OUTPATIENT_CLINIC_OR_DEPARTMENT_OTHER): Payer: Medicare Other | Admitting: Nurse Practitioner

## 2022-11-08 DIAGNOSIS — C61 Malignant neoplasm of prostate: Secondary | ICD-10-CM | POA: Diagnosis not present

## 2022-11-08 DIAGNOSIS — C7982 Secondary malignant neoplasm of genital organs: Secondary | ICD-10-CM | POA: Diagnosis not present

## 2022-11-08 DIAGNOSIS — Z7189 Other specified counseling: Secondary | ICD-10-CM | POA: Diagnosis not present

## 2022-11-08 DIAGNOSIS — C414 Malignant neoplasm of pelvic bones, sacrum and coccyx: Secondary | ICD-10-CM | POA: Diagnosis not present

## 2022-11-08 DIAGNOSIS — G893 Neoplasm related pain (acute) (chronic): Secondary | ICD-10-CM | POA: Diagnosis not present

## 2022-11-08 DIAGNOSIS — Z515 Encounter for palliative care: Secondary | ICD-10-CM | POA: Diagnosis not present

## 2022-11-08 DIAGNOSIS — K5900 Constipation, unspecified: Secondary | ICD-10-CM

## 2022-11-08 DIAGNOSIS — C7951 Secondary malignant neoplasm of bone: Secondary | ICD-10-CM

## 2022-11-09 ENCOUNTER — Ambulatory Visit (INDEPENDENT_AMBULATORY_CARE_PROVIDER_SITE_OTHER): Payer: Medicare Other | Admitting: Psychologist

## 2022-11-09 ENCOUNTER — Encounter: Payer: Self-pay | Admitting: Oncology

## 2022-11-09 DIAGNOSIS — F331 Major depressive disorder, recurrent, moderate: Secondary | ICD-10-CM | POA: Diagnosis not present

## 2022-11-09 NOTE — Progress Notes (Signed)
[facility]  Telephone:([phone]) [phone] [phone]) [phone]   Name: Lemon Sternberg Date: 11/09/2022 MRN: [id number]  DOB: 22-Oct-1946  Patient Care Team: Jimmy Footman, NP as PCP - General (Nurse Practitioner) Debera Lat, Carlena Sax, NP as Nurse Practitioner (Nurse Practitioner) Donivan Scull, Rodman Pickle, LCSW as Social Worker (Licensed Clinical Social Worker)   I connected with [name] on 11/08/22 at 11:00 AM EST by phone and verified that I am speaking with the correct person using two identifiers.   I discussed the limitations, risks, security and privacy concerns of performing an evaluation and management service by telemedicine and the availability of in-person appointments. I also discussed with the patient that there may be a patient responsible charge related to this service. The patient expressed understanding and agreed to proceed.   Other persons participating in the visit and their role in the encounter:    Patient's location: Home   Provider's location: [place]: Wayne Wicklund is a 76 y.o. male with medical history including Parkinson's disease and hyperlipidemia. 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 He is having urgent radiation appointment due to iliac bone pain. Palliative ask to see for symptom management.    SOCIAL HISTORY:     reports that he has never smoked. He has never used smokeless tobacco. He reports current alcohol use of about 15.0 standard drinks of alcohol per week. He reports that he does not use drugs.  ADVANCE DIRECTIVES:  Patient is scheduled to complete advanced directives at our clinic here at the cancer center on 02/16/2022. MOST form provided.  Patient plans to review and complete at a later time.   CODE STATUS:   PAST MEDICAL  HISTORY: Past Medical History:  Diagnosis Date   Arthritis    Depression    GERD (gastroesophageal reflux disease)    Hyperlipidemia    Hypertension    Parkinson disease    Prostate cancer ([place])     PAST SURGICAL HISTORY: No past surgical history on file.  HEMATOLOGY/ONCOLOGY HISTORY:  Oncology History  Prostate 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 ([place])  01/18/2022 Initial Diagnosis   Prostate 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 ([place])   02/20/2022 - 02/20/2022 Chemotherapy   Patient is on Treatment Plan : PROSTATE Docetaxel q21d     05/23/2022 Cancer Staging   Staging form: Prostate, AJCC 8th Edition - Clinical: Stage IVB (cTX, cNX, cM1c) - Signed by Wyatt Portela, MD on 05/23/2022     ALLERGIES:  is allergic to gabapentin and celebrex [celecoxib].  MEDICATIONS:  Current Outpatient Medications  Medication Sig Dispense Refill   amLODipine (NORVASC) 5 MG tablet Take 1 tablet (5 mg total) by mouth daily. 90 tablet 1   atorvastatin (LIPITOR) 40 MG tablet Take 1 tablet (40 mg total) by mouth every morning. 90 tablet 1   calcium-vitamin D (OSCAL WITH D) 500-5 MG-MCG tablet Take 1 tablet by mouth 2 (two) times daily. 60 tablet 3   carbidopa-levodopa (SINEMET) 25-100 MG tablet Take 1 tablet by mouth 4 (four) times daily. Take 1 hour before any meals. 360 tablet 1   COENZYME Q10 PO Take 1 capsule by mouth every morning.     hydrocortisone (ANUSOL-HC) 25 MG suppository Place 1 suppository (25 mg total) rectally daily at 12 noon. 12 suppository 1   ibuprofen (ADVIL) 600 MG tablet Take 600 mg  by mouth as needed (pain).     KRILL OIL PO Take 1 capsule by mouth every morning.     morphine (MS CONTIN) 15 MG 12 hr tablet Take 1 tablet (15 mg total) by mouth every 8 (eight) hours. 90 tablet 0   multivitamin-lutein (OCUVITE-LUTEIN) CAPS capsule Take 1 capsule by mouth every morning.     vitamin B-12 (CYANOCOBALAMIN) 1000 MCG tablet Take 1,000 mcg by mouth every morning.     No current facility-administered  medications for this visit.    PERFORMANCE STATUS (ECOG) : 1 - Symptomatic but completely ambulatory   IMPRESSION:  I connected with Mr. Retter by phone.  No acute distress identified.  He is remaining as active as possible.  Shares he is currently and watchful waiting phase per Dr. Alen Blew.  [name] is actively involved in counseling for emotional support.  He shares he had a recent fall while showering.  Fortunately no significant injuries but was sore for several days after.  [name] is now installing grab bars for additional safety measures.  Mr. Nunn continues to focus on his health.  His quality of life means the most to him.  His Parkinson's has improved with decreased tremors.  We discussed his ongoing pain and discomfort.  Feels this is well controlled with MS Contin 3 times daily.  Does endorse some arthritic pain.  He is also taking Tylenol extra strength for this.  No changes to current regimen.  We will continue to closely monitor and support as needed.  Constipation in the setting of MS Contin. Encouraged continued use of Miralax daily. He knows if no bowel movement within 48hrs he may increase to twice daily.    PLAN: MS Contin 15 mg  three times daily (increased) Tylenol extra strength 3 times a day as needed Cymbalta 20 mg daily Prune juice, tea, MiraLAX daily for bowel regimen  We will plan to have phone follow-up in 3-4 weeks for symptom follow-up. I will see patient back in 6-8 weeks in collaboration to other oncology appointments.   Patient expressed understanding and was in agreement with this plan. He also understands that He can call the clinic at any time with any questions, concerns, or complaints.        Any controlled substances utilized were prescribed in the context of palliative care. PDMP has been reviewed.    Time Total: 35 min   Visit consisted of counseling and education dealing with the complex and emotionally intense issues of symptom management and  palliative care in the setting of serious and potentially life-threatening illness.Greater than 50%  of this time was spent counseling and coordinating care related to the above assessment and plan.  Alda Lea, AGPCNP-BC  Palliative Medicine Team/[place]

## 2022-11-09 NOTE — Progress Notes (Signed)
[place] Counselor Initial Adult Exam  Name: Frederick Ponce Date: 11/09/2022 MRN: [id number] DOB: September 25, 1946 PCP: Jimmy Footman, NP  Time spent: 11:07 am to 11:50 am; total time: 43 minutes  This session was held via in person. The patient consented to in-person therapy and was in the clinician's office. Limits of confidentiality were discussed with the patient.    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  Mental Status Exam: Appearance:   Well Groomed     Behavior:  Appropriate  Motor:  Normal  Speech/Language:   Clear and Coherent  Affect:  Appropriate  Mood:  normal  Thought process:  normal  Thought content:    WNL  Sensory/Perceptual disturbances:    WNL  Orientation:  oriented to person, place, and time/date  Attention:  Good  Concentration:  Good  Memory:  WNL  Fund of knowledge:   Good  Insight:    Good  Judgment:   Good  Impulse Control:  Good    Reported Symptoms:  The patient endorsed experiencing the following: feeling down, sad, tearful, social isolation, fatigue, lack of motivation, rumination of thoughts, low self-esteem, thoughts of hopelessness and worthlessness, and passive suicidal ideation. He denied having a plan or intent to act on a plan at this time. He denied homicidal ideation.   Risk Assessment: Danger to Self:   Patient endorsed experiencing passive suicidal ideation. He denied having a plan or intent to act on a plan currently.  Self-injurious Behavior: No Danger to Others: No Duty to Warn:no Physical Aggression / Violence:No  Access to Firearms a concern: No  Gang Involvement:No  Patient / guardian was educated about steps to take if suicide or homicide risk level increases between visits: n/a While future psychiatric events cannot be accurately predicted, the patient does not currently require acute  inpatient psychiatric care and does not currently meet [facility] involuntary commitment criteria.  Substance Abuse History: Current substance abuse: No     Past Psychiatric History:   Previous psychological history is significant for depression Outpatient Providers:NA History of Psych Hospitalization: Yes, patient was hospitalized in July due to an intentional drug overdose due to uncontrollable pain Psychological Testing:  na    Abuse History:  Victim of: No.,  na    Report needed: No. Victim of Neglect:No. Perpetrator of  NA   Witness / Exposure to Domestic Violence: No   Protective Services Involvement: No  Witness to [employer] Violence:  No   Family History:  Family History  Problem Relation Age of Onset   Cancer Mother    Kidney disease Mother    Heart disease Father    Tremor Father    Parkinson's disease Father    Melanoma Sister    Parkinson's disease Sister    Cancer Brother    Parkinson's disease Paternal Aunt    Parkinson's disease Paternal Grandmother     Living situation: the patient lives with their spouse  Sexual Orientation: Gay  Relationship Status: married  Name of spouse / other:David If a parent, number of children / ages:NA  Support Systems: spouse  Museum/gallery curator Stress:  No   Income/Employment/Disability: Actor: No   Educational History: Education: college graduate  Religion/Sprituality/World View: Spiritual individual  Any cultural differences that may affect / interfere with treatment:  not applicable   Recreation/Hobbies: Working  Stressors: Other: Metastatic prostate cancer  Strengths: Supportive Relationships  Barriers:  NA   Legal History: Pending legal issue / charges: The patient has no significant history of legal issues. History of legal issue / charges:  Patient has long history of DUIs  Medical History/Surgical History: reviewed Past Medical History:  Diagnosis Date    Arthritis    Depression    GERD (gastroesophageal reflux disease)    Hyperlipidemia    Hypertension    Parkinson disease    Prostate cancer ([place])     No past surgical history on file.  Medications: Current Outpatient Medications  Medication Sig Dispense Refill   amLODipine (NORVASC) 5 MG tablet Take 1 tablet (5 mg total) by mouth daily. 90 tablet 1   atorvastatin (LIPITOR) 40 MG tablet Take 1 tablet (40 mg total) by mouth every morning. 90 tablet 1   calcium-vitamin D (OSCAL WITH D) 500-5 MG-MCG tablet Take 1 tablet by mouth 2 (two) times daily. 60 tablet 3   carbidopa-levodopa (SINEMET) 25-100 MG tablet Take 1 tablet by mouth 4 (four) times daily. Take 1 hour before any meals. 360 tablet 1   COENZYME Q10 PO Take 1 capsule by mouth every morning.     hydrocortisone (ANUSOL-HC) 25 MG suppository Place 1 suppository (25 mg total) rectally daily at 12 noon. 12 suppository 1   ibuprofen (ADVIL) 600 MG tablet Take 600 mg by mouth as needed (pain).     KRILL OIL PO Take 1 capsule by mouth every morning.     morphine (MS CONTIN) 15 MG 12 hr tablet Take 1 tablet (15 mg total) by mouth every 8 (eight) hours. 90 tablet 0   multivitamin-lutein (OCUVITE-LUTEIN) CAPS capsule Take 1 capsule by mouth every morning.     vitamin B-12 (CYANOCOBALAMIN) 1000 MCG tablet Take 1,000 mcg by mouth every morning.     No current facility-administered medications for this visit.    Allergies  Allergen Reactions   Gabapentin Other (See Comments)    Restless legs/confusion   Celebrex [Celecoxib] Rash    Diagnoses:  F33.1 major depressive affective disorder, recurrent, moderate  Plan of Care: The patient is a 76 year old Caucasian male who was referred due to experiencing depressive symptoms secondary to metastatic prostate cancer. The patient lives at home with his husband. The patient meets criteria for a diagnosis of F33.1 major depressive affective disorder, recurrent, moderate based off of the  following: feeling down, sad, tearful, social isolation, fatigue, lack of motivation, rumination of thoughts, low self-esteem, thoughts of hopelessness and worthlessness, and passive suicidal ideation. He denied having a plan or intent to act on a plan at this time. He denied homicidal ideation.   The patient stated that he wants to find meaning, purpose, and to have direction in life.   This psychologist makes the recommendation that the patient participate in therapy bi-weekly   Conception Chancy, PsyD

## 2022-11-09 NOTE — Progress Notes (Signed)
                Abbigal Radich, PsyD 

## 2022-11-09 NOTE — Plan of Care (Signed)

## 2022-11-12 ENCOUNTER — Telehealth: Payer: Self-pay | Admitting: Gastroenterology

## 2022-11-12 NOTE — Progress Notes (Signed)
  Radiation Oncology         [phone]) [phone] ________________________________  Name: Frederick Ponce MRN: [id number]  Date of Service: 11/14/2022  DOB: 02/23/1946  Post Treatment Telephone Note  Diagnosis:  76 y.o. 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 A voicemail was left.  The patient is scheduled for ongoing care with Dr. Alen Blew in medical oncology. The patient was encouraged to call if he  develop concerns or questions regarding radiation.    Leandra Kern, LPN

## 2022-11-12 NOTE — Telephone Encounter (Signed)
Left message for pt to call back  °

## 2022-11-12 NOTE — Telephone Encounter (Signed)
Patient is calling regarding his results from recent appointment wondering when he'll receive them. Please advise.

## 2022-11-13 ENCOUNTER — Encounter: Payer: Self-pay | Admitting: Oncology

## 2022-11-13 ENCOUNTER — Ambulatory Visit (INDEPENDENT_AMBULATORY_CARE_PROVIDER_SITE_OTHER): Payer: Medicare Other | Admitting: Psychologist

## 2022-11-13 DIAGNOSIS — F331 Major depressive disorder, recurrent, moderate: Secondary | ICD-10-CM

## 2022-11-13 NOTE — Progress Notes (Signed)
                Kasen Sako, PsyD 

## 2022-11-13 NOTE — Progress Notes (Signed)
                                                                                                                                                             Patient Name: Frederick Ponce MRN: [id number] DOB: 11-Sep-1946 Referring Physician: [place] Date of Service: 09/18/2022 [facility], [place]                                                        End Of Treatment Note  Diagnoses: C61-Malignant neoplasm of prostate C79.51-Secondary malignant neoplasm of bone  Cancer Staging:  76 y.o. 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  Plan: The patient will receive a call in about one month from the radiation oncology department. He will continue follow up with Dr. Alen Blew and the palliative care team as well.   Nicholos Johns, MMS, PA-C [facility] [place]: [phone]  Fax: [phone]

## 2022-11-13 NOTE — Progress Notes (Signed)
[place] Counselor/Therapist Progress Note  Patient ID: Rossi Burdo, MRN: [id number],    Date: 11/13/2022  Time Spent: 09:03 am to 09:48 am; total time: 45 minutes   This session was held via in person. The patient consented to in-person therapy and was in the clinician's office. Limits of confidentiality were discussed with the patient.    Treatment Type: Individual Therapy  Reported Symptoms: Depression  Mental Status Exam: Appearance:  Well Groomed     Behavior: Appropriate  Motor: Normal  Speech/Language:  Clear and Coherent  Affect: Appropriate  Mood: normal  Thought process: normal  Thought content:   WNL  Sensory/Perceptual disturbances:   WNL  Orientation: oriented to person, place, and time/date  Attention: Good  Concentration: Good  Memory: WNL  Fund of knowledge:  Good  Insight:   Fair  Judgment:  Good  Impulse Control: Good   Risk Assessment: Danger to Self:  No Self-injurious Behavior: No Danger to Others: No Duty to Warn:no Physical Aggression / Violence:No  Access to Firearms a concern: No  Gang Involvement:No   Subjective: Beginning the session, patient described himself as okay. After reviewing the treatment plan, patient described himself as having a difficult weekend as he awaited to hear from GI. After hearing positive news, patient spent the session talking about experiencing a difficult time with going through changes in his life. He acknowledged that change is something that he struggles with. As part of this, the theme of control was identified. He processed thoughts and emotions. He then voiced some apprehension regarding getting comfortable with change as he expressed fears that it meant sacrificing his identity. He then spent time talking about some challenges he experiences with [name]. He processed thoughts and emotions. He was agreeable to homework and following up. He denied suicidal and homicidal ideation.     Interventions:  Worked on developing a therapeutic relationship with the patient using active listening and reflective statements. Provided emotional support using empathy and validation. Reviewed the treatment plan with the patient. Reviewed events since the intake. Processed the events over the weekend. Praised the patient for receiving positive news. Identified goals for the session. Spent time processing thoughts and emotions related to Thanksgiving. Used socratic questions to assist the patient gain insight into self. Challenged some of the thoughts expressed. Used summary statements. Validated that making changes is very hard. Provided psychoeducation about what makes this change much more difficult. Identified the theme of lack of control. Provided psychoeducation about podcast episode about master of change. Processed fears regarding changing identity. Normalized and validated concerns. Assisted in problem solving. Validated some of the concerns expressed regarding spouse. Provided empathic statements. Assigned homework. Assessed for suicidal and homicidal ideation.   Homework: Listen to podcast episode about master of change from psychologists off the clock  Next Session: Review homework. Process finding purpose and meaning while getting comfortable with changes.   Diagnosis: F33.1 major depressive affective disorder, recurrent, moderate  Plan:   Goals Work through the grieving process and face reality of own death Accept emotional support from others around them Live life to the fullest, event though time may be limited Become as knowledgeable about the medical condition  Reduce fear, anxiety about the health condition  Accept the illness Accept the role of psychological and behavioral factors  Stabilize anxiety level wile increasing ability to function Learn and implement coping skills that result in a reduction of anxiety  Alleviate depressive symptoms Recognize, accept, and cope  with depressive feelings  Develop healthy thinking patterns Develop healthy interpersonal relationships  Objectives target date for all objectives is 11/10/2023 Identify feelings associated with the illness Family members share with each other feelings Identify the losses or limitations that have been experienced Verbalize acceptance of the reality of the medical condition Commit to learning and implement a proactive approach to managing personal stresses Verbalize an understanding of the medical condition Work with therapist to develop a plan for coping with stress Learn and implement skills for managing stress Engage in social, productive activities that are possible Engage in faith based activities implement positive imagery Identify coping skills and sources of emotional support Patient's partner and family members verbalize their fears regarding severity of health condition Identify sources of emotional distress  Learning and implement calming skills to reduce overall anxiety Learn and implement problem solving strategies Identify and engage in pleasant activities Learning and implement personal and interpersonal skills to reduce anxiety and improve interpersonal relationships Learn to accept limitations in life and commit to tolerating, rather than avoiding, unpleasant emotions while accomplishing meaningful goals Identify major life conflicts from the past and present that form the basis for present anxiety Learn and implement behavioral strategies Verbalize an understanding and resolution of current interpersonal problems Learn and implement problem solving and decision making skills Learn and implement conflict resolution skills to resolve interpersonal problems Verbalize an understanding of healthy and unhealthy emotions verbalize insight into how past relationships may be influence current experiences with depression Use mindfulness and acceptance strategies and increase value  based behavior  Increase hopeful statements about the future.   Interventions Teach about stress and ways to handle stress Assist the patient in developing a coping action plan for stressors Conduct skills based training for coping strategies Train problem focused skills Sort out what activities the individual can do Encourage patient to rely upon his/her spiritual faith Teach the patient to use guided imagery Probe and evaluate family's ability to provide emotional support Allow family to share their fears Assist the patient in identifying, sorting through, and verbalizing the various feelings generated by his/her medical condition Meet with family members  Ask patient list out limitations  Use stress inoculation training  Use Acceptance and Commitment Therapy to help client accept uncomfortable realities in order to accomplish value-consistent goals Reinforce the client's insight into the role of his/her past emotional pain and present anxiety  Discuss examples demonstrating that unrealistic worry overestimates the probability of threats and underestimate patient's ability  Assist the patient in analyzing his or her worries Help patient understand that avoidance is reinforcing  Behavioral activation help the client explore the relationship, nature of the dispute,  Help the client develop new interpersonal skills and relationships Conduct Problem so living therapy Teach conflict resolution skills Use a process-experiential approach Conduct TLDP Conduct ACT  The patient and clinician reviewed the treatment plan on 11/13/2022. The patient approved of the treatment plan.   Conception Chancy, PsyD

## 2022-11-14 ENCOUNTER — Ambulatory Visit
Admission: RE | Admit: 2022-11-14 | Discharge: 2022-11-14 | Disposition: A | Discharge status: Home / Self Care | Payer: Medicare Other | Source: Ambulatory Visit | Attending: Urology | Admitting: Urology

## 2022-11-14 DIAGNOSIS — C7951 Secondary malignant neoplasm of bone: Secondary | ICD-10-CM

## 2022-11-14 LAB — GI PROFILE, STOOL, PCR

## 2022-11-14 LAB — CALPROTECTIN, FECAL: Calprotectin, Fecal: 10 ug/g (ref 0–120)

## 2022-11-14 LAB — PANCREATIC ELASTASE, FECAL: Pancreatic Elastase-1, Stool: >500 mcg/g

## 2022-11-14 NOTE — Telephone Encounter (Signed)
Left message for pt to call back  °

## 2022-11-19 NOTE — Telephone Encounter (Signed)
Left message for pt to call back. See 11/16 stool test result notes. Mychart message sent to pt.

## 2022-11-23 ENCOUNTER — Telehealth: Payer: Self-pay

## 2022-11-23 NOTE — Telephone Encounter (Signed)
Pt called reported general aches that are not controlled by his MS Contin that are also interrupting his ADLs. Pt also reporting swelling in hands and ankles with "red spots like a rash but they are not raised". Per Lexine Baton, NP pt to f/u with PCP, pt made aware and verbalized understanding. No further questions at this time.

## 2022-11-28 ENCOUNTER — Ambulatory Visit (INDEPENDENT_AMBULATORY_CARE_PROVIDER_SITE_OTHER): Payer: Medicare Other | Admitting: Psychologist

## 2022-11-28 DIAGNOSIS — F331 Major depressive disorder, recurrent, moderate: Secondary | ICD-10-CM | POA: Diagnosis not present

## 2022-11-28 NOTE — Progress Notes (Signed)
[place] Counselor/Therapist Progress Note  Patient ID: Frederick Ponce, MRN: [id number],    Date: 11/28/2022  Time Spent: 02:04 pm to 02:48 pm; total time: 44 minutes   This session was held via in person. The patient consented to in-person therapy and was in the clinician's office. Limits of confidentiality were discussed with the patient.    Treatment Type: Individual Therapy  Reported Symptoms: Depression  Mental Status Exam: Appearance:  Well Groomed     Behavior: Appropriate  Motor: Normal  Speech/Language:  Clear and Coherent  Affect: Appropriate  Mood: normal  Thought process: normal  Thought content:   WNL  Sensory/Perceptual disturbances:   WNL  Orientation: oriented to person, place, and time/date  Attention: Good  Concentration: Good  Memory: WNL  Fund of knowledge:  Good  Insight:   Fair  Judgment:  Good  Impulse Control: Good   Risk Assessment: Danger to Self:  No Self-injurious Behavior: No Danger to Others: No Duty to Warn:no Physical Aggression / Violence:No  Access to Firearms a concern: No  Gang Involvement:No   Subjective: Beginning the session, patient described himself as better while reflecting on events since the last session. He began the session reflecting on his homework and what he thought of the podcast. From there, he explored how his values changed over the years. He ended the session reflecting on not knowing what he wants to do with his life currently and difficulty finding purpose. He also voiced dwelling on death.  He was agreeable to homework and following up. He denied suicidal and homicidal ideation.    Interventions:  Worked on developing a therapeutic relationship with the patient using active listening and reflective statements. Provided emotional support using empathy and validation. Reviewed the treatment plan with the patient. Praised the patient for doing better and explored what has assisted the patient.  Reviewed the homework assignment. Processed thoughts and emotions. Validated that values change over life. Began to process patient's values. Used socratic questions to assist the patient gain insight into self. Challenged some thoughts expressed. Identified a couple of different values. Began to explore what patient "dwells" on. Validated thoughts. Began to explore finding purpose and meaning. Provided empathic statements. Assigned homework. Assessed for suicidal and homicidal ideation.   Homework: Reflect on finding meaning and identifying activities he wants to participate in.   Next Session: Review homework and emotional support. Begin to discuss doing meaning centered work and finding purpose  Diagnosis: F33.1 major depressive affective disorder, recurrent, moderate  Plan:   Goals Work through the grieving process and face reality of own death Accept emotional support from others around them Live life to the fullest, event though time may be limited Become as knowledgeable about the medical condition  Reduce fear, anxiety about the health condition  Accept the illness Accept the role of psychological and behavioral factors  Stabilize anxiety level wile increasing ability to function Learn and implement coping skills that result in a reduction of anxiety  Alleviate depressive symptoms Recognize, accept, and cope with depressive feelings Develop healthy thinking patterns Develop healthy interpersonal relationships  Objectives target date for all objectives is 11/10/2023 Identify feelings associated with the illness Family members share with each other feelings Identify the losses or limitations that have been experienced Verbalize acceptance of the reality of the medical condition Commit to learning and implement a proactive approach to managing personal stresses Verbalize an understanding of the medical condition Work with therapist to develop a plan for coping with  stress Learn  and implement skills for managing stress Engage in social, productive activities that are possible Engage in faith based activities implement positive imagery Identify coping skills and sources of emotional support Patient's partner and family members verbalize their fears regarding severity of health condition Identify sources of emotional distress  Learning and implement calming skills to reduce overall anxiety Learn and implement problem solving strategies Identify and engage in pleasant activities Learning and implement personal and interpersonal skills to reduce anxiety and improve interpersonal relationships Learn to accept limitations in life and commit to tolerating, rather than avoiding, unpleasant emotions while accomplishing meaningful goals Identify major life conflicts from the past and present that form the basis for present anxiety Learn and implement behavioral strategies Verbalize an understanding and resolution of current interpersonal problems Learn and implement problem solving and decision making skills Learn and implement conflict resolution skills to resolve interpersonal problems Verbalize an understanding of healthy and unhealthy emotions verbalize insight into how past relationships may be influence current experiences with depression Use mindfulness and acceptance strategies and increase value based behavior  Increase hopeful statements about the future.   Interventions Teach about stress and ways to handle stress Assist the patient in developing a coping action plan for stressors Conduct skills based training for coping strategies Train problem focused skills Sort out what activities the individual can do Encourage patient to rely upon his/her spiritual faith Teach the patient to use guided imagery Probe and evaluate family's ability to provide emotional support Allow family to share their fears Assist the patient in identifying, sorting through, and  verbalizing the various feelings generated by his/her medical condition Meet with family members  Ask patient list out limitations  Use stress inoculation training  Use Acceptance and Commitment Therapy to help client accept uncomfortable realities in order to accomplish value-consistent goals Reinforce the client's insight into the role of his/her past emotional pain and present anxiety  Discuss examples demonstrating that unrealistic worry overestimates the probability of threats and underestimate patient's ability  Assist the patient in analyzing his or her worries Help patient understand that avoidance is reinforcing  Behavioral activation help the client explore the relationship, nature of the dispute,  Help the client develop new interpersonal skills and relationships Conduct Problem so living therapy Teach conflict resolution skills Use a process-experiential approach Conduct TLDP Conduct ACT  The patient and clinician reviewed the treatment plan on 11/13/2022. The patient approved of the treatment plan.   Conception Chancy, PsyD

## 2022-12-03 ENCOUNTER — Inpatient Hospital Stay: Payer: Medicare Other | Attending: Nurse Practitioner

## 2022-12-03 ENCOUNTER — Telehealth: Payer: Self-pay | Admitting: Genetic Counselor

## 2022-12-03 ENCOUNTER — Other Ambulatory Visit: Payer: Self-pay

## 2022-12-03 DIAGNOSIS — C7951 Secondary malignant neoplasm of bone: Secondary | ICD-10-CM | POA: Diagnosis present

## 2022-12-03 DIAGNOSIS — Z5111 Encounter for antineoplastic chemotherapy: Secondary | ICD-10-CM | POA: Diagnosis not present

## 2022-12-03 DIAGNOSIS — R918 Other nonspecific abnormal finding of lung field: Secondary | ICD-10-CM | POA: Insufficient documentation

## 2022-12-03 DIAGNOSIS — G893 Neoplasm related pain (acute) (chronic): Secondary | ICD-10-CM

## 2022-12-03 DIAGNOSIS — N390 Urinary tract infection, site not specified: Secondary | ICD-10-CM

## 2022-12-03 DIAGNOSIS — Z79899 Other long term (current) drug therapy: Secondary | ICD-10-CM | POA: Diagnosis not present

## 2022-12-03 DIAGNOSIS — Z923 Personal history of irradiation: Secondary | ICD-10-CM | POA: Diagnosis not present

## 2022-12-03 DIAGNOSIS — C61 Malignant neoplasm of prostate: Secondary | ICD-10-CM | POA: Insufficient documentation

## 2022-12-03 DIAGNOSIS — G20A1 Parkinson's disease without dyskinesia, without mention of fluctuations: Secondary | ICD-10-CM | POA: Insufficient documentation

## 2022-12-03 DIAGNOSIS — Z515 Encounter for palliative care: Secondary | ICD-10-CM

## 2022-12-03 LAB — URINALYSIS, COMPLETE (UACMP) WITH MICROSCOPIC
Bilirubin Urine: NEGATIVE
Glucose, UA: NEGATIVE mg/dL
Hgb urine dipstick: NEGATIVE
Ketones, ur: NEGATIVE mg/dL
Nitrite: NEGATIVE
Protein, ur: NEGATIVE mg/dL
Specific Gravity, Urine: 1.01 (ref 1.005–1.030)
pH: 6 (ref 5.0–8.0)

## 2022-12-03 LAB — CBC WITH DIFFERENTIAL (CANCER CENTER ONLY)
Abs Immature Granulocytes: 0.02 10*3/uL (ref 0.00–0.07)
Basophils Absolute: 0 10*3/uL (ref 0.0–0.1)
Basophils Relative: 1 %
Eosinophils Absolute: 0.1 10*3/uL (ref 0.0–0.5)
Eosinophils Relative: 3 %
HCT: 35.8 % — ABNORMAL LOW (ref 39.0–52.0)
Hemoglobin: 12.1 g/dL — ABNORMAL LOW (ref 13.0–17.0)
Immature Granulocytes: 0 %
Lymphocytes Relative: 22 %
Lymphs Abs: 1.2 10*3/uL (ref 0.7–4.0)
MCH: 33.5 pg (ref 26.0–34.0)
MCHC: 33.8 g/dL (ref 30.0–36.0)
MCV: 99.2 fL (ref 80.0–100.0)
Monocytes Absolute: 0.5 10*3/uL (ref 0.1–1.0)
Monocytes Relative: 9 %
Neutro Abs: 3.4 10*3/uL (ref 1.7–7.7)
Neutrophils Relative %: 65 %
Platelet Count: 196 10*3/uL (ref 150–400)
RBC: 3.61 MIL/uL — ABNORMAL LOW (ref 4.22–5.81)
RDW: 13.3 % (ref 11.5–15.5)
WBC Count: 5.3 10*3/uL (ref 4.0–10.5)
nRBC: 0 % (ref 0.0–0.2)

## 2022-12-03 LAB — CMP (CANCER CENTER ONLY)
ALT: 6 U/L (ref 0–44)
AST: 15 U/L (ref 15–41)
Albumin: 4.2 g/dL (ref 3.5–5.0)
Alkaline Phosphatase: 42 U/L (ref 38–126)
Anion gap: 8 (ref 5–15)
BUN: 18 mg/dL (ref 8–23)
CO2: 25 mmol/L (ref 22–32)
Calcium: 9.8 mg/dL (ref 8.9–10.3)
Chloride: 106 mmol/L (ref 98–111)
Creatinine: 0.75 mg/dL (ref 0.61–1.24)
GFR, Estimated: >60 mL/min (ref >60)
Glucose, Bld: 107 mg/dL — ABNORMAL HIGH (ref 70–99)
Potassium: 4.7 mmol/L (ref 3.5–5.1)
Sodium: 139 mmol/L (ref 135–145)
Total Bilirubin: 1 mg/dL (ref 0.3–1.2)
Total Protein: 6.6 g/dL (ref 6.5–8.1)

## 2022-12-03 MED ORDER — MORPHINE SULFATE ER 15 MG PO TBCR: 15.0000 mg | EXTENDED_RELEASE_TABLET | Freq: Three times a day (TID) | ORAL | 0 refills | Status: DC

## 2022-12-03 NOTE — Telephone Encounter (Signed)
Patient confirmed genetics appt at 2pm on Wednesday but needs to be at [facility] for 3pm appt. Briefly discussed family implications of germline testing.

## 2022-12-03 NOTE — Telephone Encounter (Signed)
Pt called for refill, see new orders.  

## 2022-12-03 NOTE — Telephone Encounter (Signed)
LVM informing patient that genetic counseling/testing is recommended given his personal cancer history. Genetics appt scheduled for 12/13 at 2pm with Santiago Glad after his other CC appointments.  Call back number given if questions arise or he wishes to cancel/reschedule.   Of note, patient has BRCA2 mutation by [employer].

## 2022-12-04 LAB — URINE CULTURE: Culture: NO GROWTH

## 2022-12-04 LAB — PROSTATE-SPECIFIC AG, SERUM (LABCORP): Prostate Specific Ag, Serum: 0.5 ng/mL (ref 0.0–4.0)

## 2022-12-05 ENCOUNTER — Other Ambulatory Visit: Payer: Medicare Other

## 2022-12-05 ENCOUNTER — Inpatient Hospital Stay (HOSPITAL_BASED_OUTPATIENT_CLINIC_OR_DEPARTMENT_OTHER): Payer: Medicare Other | Admitting: Genetic Counselor

## 2022-12-05 ENCOUNTER — Inpatient Hospital Stay: Payer: Medicare Other

## 2022-12-05 ENCOUNTER — Inpatient Hospital Stay (HOSPITAL_BASED_OUTPATIENT_CLINIC_OR_DEPARTMENT_OTHER): Payer: Medicare Other | Admitting: Nurse Practitioner

## 2022-12-05 ENCOUNTER — Other Ambulatory Visit: Payer: Self-pay

## 2022-12-05 ENCOUNTER — Other Ambulatory Visit: Payer: Self-pay | Admitting: Genetic Counselor

## 2022-12-05 ENCOUNTER — Inpatient Hospital Stay (HOSPITAL_BASED_OUTPATIENT_CLINIC_OR_DEPARTMENT_OTHER): Payer: Medicare Other | Admitting: Oncology

## 2022-12-05 ENCOUNTER — Encounter: Payer: Self-pay | Admitting: Nurse Practitioner

## 2022-12-05 ENCOUNTER — Encounter: Payer: Self-pay | Admitting: Genetic Counselor

## 2022-12-05 VITALS — BP 131/71 | HR 85 | Temp 97.5°F | Resp 18 | Ht 68.0 in | Wt 171.4 lb

## 2022-12-05 DIAGNOSIS — C7951 Secondary malignant neoplasm of bone: Secondary | ICD-10-CM

## 2022-12-05 DIAGNOSIS — R530 Neoplastic (malignant) related fatigue: Secondary | ICD-10-CM | POA: Diagnosis not present

## 2022-12-05 DIAGNOSIS — Z515 Encounter for palliative care: Secondary | ICD-10-CM

## 2022-12-05 DIAGNOSIS — G893 Neoplasm related pain (acute) (chronic): Secondary | ICD-10-CM | POA: Diagnosis not present

## 2022-12-05 DIAGNOSIS — Z808 Family history of malignant neoplasm of other organs or systems: Secondary | ICD-10-CM

## 2022-12-05 DIAGNOSIS — Z8042 Family history of malignant neoplasm of prostate: Secondary | ICD-10-CM

## 2022-12-05 DIAGNOSIS — C61 Malignant neoplasm of prostate: Secondary | ICD-10-CM

## 2022-12-05 DIAGNOSIS — K5903 Drug induced constipation: Secondary | ICD-10-CM | POA: Diagnosis not present

## 2022-12-05 DIAGNOSIS — Z5111 Encounter for antineoplastic chemotherapy: Secondary | ICD-10-CM | POA: Diagnosis not present

## 2022-12-05 MED ORDER — DENOSUMAB 120 MG/1.7ML ~~LOC~~ SOLN
120.0000 mg | Freq: Once | SUBCUTANEOUS | Status: AC
  Administered 2022-12-05: 120 mg via SUBCUTANEOUS
  Filled 2022-12-05: qty 1.7

## 2022-12-05 MED ORDER — LEUPROLIDE ACETATE (4 MONTH) 30 MG ~~LOC~~ KIT
30.0000 mg | PACK | Freq: Once | SUBCUTANEOUS | Status: AC
  Administered 2022-12-05: 30 mg via SUBCUTANEOUS

## 2022-12-05 NOTE — Progress Notes (Signed)
[place]  [phone]) [phone] [phone]) [phone]   Name: Frederick Ponce Date: 12/05/2022 MRN: [id number]  DOB: 1946-11-25  Patient Care Team: Jimmy Footman, NP as PCP - General (Nurse Practitioner) Debera Lat, Carlena Sax, NP as Nurse Practitioner (Nurse Practitioner) Duffy, Rodman Pickle, LCSW as Social Worker (Licensed Clinical Social Worker)   REASON FOR CONSULTATION: Frederick Ponce is a 76 y.o. male with medical history including Parkinson's disease and hyperlipidemia. 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 He is having urgent radiation appointment due to iliac bone pain. Palliative ask to see for symptom management.    SOCIAL HISTORY:     reports that he has never smoked. He has never used smokeless tobacco. He reports current alcohol use of about 15.0 standard drinks of alcohol per week. He reports that he does not use drugs.  ADVANCE DIRECTIVES:  Patient is scheduled to complete advanced directives at our clinic here at the cancer center on 02/16/2022. MOST form provided.  Patient plans to review and complete at a later time.   CODE STATUS:   PAST MEDICAL HISTORY: Past Medical History:  Diagnosis Date   Arthritis    Depression    Family history of melanoma    Family history of prostate cancer    GERD (gastroesophageal reflux disease)    Hyperlipidemia    Hypertension    Parkinson disease    Prostate cancer ([place])     PAST SURGICAL HISTORY: History reviewed. No pertinent surgical history.  HEMATOLOGY/ONCOLOGY HISTORY:  Oncology History  Prostate 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 ([place])  01/18/2022 Initial Diagnosis   Prostate 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 ([place])   02/20/2022 - 02/20/2022 Chemotherapy   Patient is on Treatment Plan : PROSTATE Docetaxel q21d     05/23/2022 Cancer Staging   Staging form:  Prostate, AJCC 8th Edition - Clinical: Stage IVB (cTX, cNX, cM1c) - Signed by Wyatt Portela, MD on 05/23/2022     ALLERGIES:  is allergic to gabapentin and celebrex [celecoxib].  MEDICATIONS:  Current Outpatient Medications  Medication Sig Dispense Refill   amLODipine (NORVASC) 5 MG tablet Take 1 tablet (5 mg total) by mouth daily. 90 tablet 1   atorvastatin (LIPITOR) 40 MG tablet Take 1 tablet (40 mg total) by mouth every morning. 90 tablet 1   calcium-vitamin D (OSCAL WITH D) 500-5 MG-MCG tablet Take 1 tablet by mouth 2 (two) times daily. 60 tablet 3   carbidopa-levodopa (SINEMET) 25-100 MG tablet Take 1 tablet by mouth 4 (four) times daily. Take 1 hour before any meals. 360 tablet 1   COENZYME Q10 PO Take 1 capsule by mouth every morning.     hydrocortisone (ANUSOL-HC) 25 MG suppository Place 1 suppository (25 mg total) rectally daily at 12 noon. 12 suppository 1   ibuprofen (ADVIL) 600 MG tablet Take 600 mg by mouth as needed (pain).     KRILL OIL PO Take 1 capsule by mouth every morning.     morphine (MS CONTIN) 15 MG 12 hr tablet Take 1 tablet (15 mg total) by mouth every 8 (eight) hours. 90 tablet 0   multivitamin-lutein (OCUVITE-LUTEIN) CAPS capsule Take 1 capsule by mouth every morning.     vitamin B-12 (CYANOCOBALAMIN) 1000 MCG tablet Take 1,000 mcg by mouth every morning.     No current facility-administered medications for this visit.    PERFORMANCE STATUS (ECOG) : 1 -  Symptomatic but completely ambulatory   IMPRESSION: [name] scented to clinic today for follow-up.  No acute distress identified.  He is remaining as active as possible.  He also had a previous appointment with Dr. Alen Blew.  Reports his PSA levels remain stable which she is appreciative of.  We also discussed his recent UA results which does not warrant any use of antibiotics at this time.  Encouraged to continue to increase fluids specifically water intake.  He acknowledges upcoming urology  appointment.  He continues to be actively involved in group counseling.  Feels this has been somewhat effective with his coping and hearing other stories and experiences from individuals that can relate to his current condition.  [name] continues to focus on his health.  His quality of life means the most to him.  His Parkinson's has improved with decreased tremors.  We discussed his ongoing pain and discomfort.  Feels this is well controlled with MS Contin 3 times daily.  Does endorse some arthritic pain.  He is also taking Tylenol extra strength for this.  Reports occasions where he is awakened at night with knee and lower leg pain.  However when these episodes occur pain is the escalated with the use of Tylenol.  He recently received Toradol injection by outside provider.  Also advised by his primary physician to restart his cymbalta. Education provided on use and benefits. He is taking 20 mg daily and tolerating.   No changes to current regimen.  We will continue to closely monitor and support as needed.  Constipation in the setting of MS Contin. He is taking dulcolax every other day and is having a good response compared to use of miralax.    PLAN: MS Contin 15 mg  three times daily  Tylenol extra strength 3 times a day as needed Cymbalta 20 mg daily Prune juice, tea, Dulcolax daily for bowel regimen  I will see patient back in 4-6 weeks in collaboration to other oncology appointments.   Patient expressed understanding and was in agreement with this plan. He also understands that He can call the clinic at any time with any questions, concerns, or complaints.    Any controlled substances utilized were prescribed in the context of palliative care. PDMP has been reviewed.    Time Total: 45 min   Visit consisted of counseling and education dealing with the complex and emotionally intense issues of symptom management and palliative care in the setting of serious and potentially  life-threatening illness.Greater than 50%  of this time was spent counseling and coordinating care related to the above assessment and plan.  Frederick Ponce, AGPCNP-BC  Palliative Medicine Team/ [place]

## 2022-12-05 NOTE — Progress Notes (Signed)
REFERRING PROVIDER: Shadad, Firas N, MD [address] [place],  [place] [address]  PRIMARY PROVIDER:  Pickenpack-Cousar, Athena N, NP  PRIMARY REASON FOR VISIT:  1. Family history of prostate cancer   2. Family history of melanoma   3. Prostate 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 ([facility])      HISTORY OF PRESENT ILLNESS:   [name], a 76 y.o. male, was seen for a [facility] [place] cancer genetics consultation at the request of Dr. Shadad due to a personal and family history of prostate cancer and a BRCA2 mutation on somatic testing through [facility].  Mr. Vangilder presents to clinic today to discuss the possibility of a hereditary predisposition to cancer, genetic testing, and to further clarify his future cancer risks, as well as potential cancer risks for family members.   In January 2023, at the age of 75, Mr. Chevez was diagnosed with metastatic cancer of the prostate. [facility] testing identified a BRCA2 somatic mutation.      CANCER HISTORY:  Oncology History  Prostate 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 ([facility])  01/18/2022 Initial Diagnosis   Prostate 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 ([facility])   02/20/2022 - 02/20/2022 Chemotherapy   Patient is on Treatment Plan : PROSTATE Docetaxel q21d     05/23/2022 Cancer Staging   Staging form: Prostate, AJCC 8th Edition - Clinical: Stage IVB (cTX, cNX, cM1c) - Signed by Shadad, Firas N, MD on 05/23/2022      Past Medical History:  Diagnosis Date   Arthritis    Depression    Family history of melanoma    Family history of prostate cancer    GERD (gastroesophageal reflux disease)    Hyperlipidemia    Hypertension    Parkinson disease    Prostate cancer (HCC)     No past surgical history on file.  Social History   Socioeconomic History   Marital status: Married    Spouse name: David Jackson   Number of children: 0   Years of education: Not on file   Highest education level: Associate degree: academic program  Occupational History   Not on file   Tobacco Use   Smoking status: Never   Smokeless tobacco: Never  Vaping Use   Vaping Use: Never used  Substance and Sexual Activity   Alcohol use: Yes    Alcohol/week: 15.0 standard drinks of alcohol    Types: 15 Glasses of wine per week    Comment: couple glasses a night   Drug use: Never   Sexual activity: Not Currently    Birth control/protection: None  Other Topics Concern   Not on file  Social History Narrative   Not on file   Social Determinants of Health   Financial Resource Strain: Low Risk  (10/12/2022)   Overall Financial Resource Strain (CARDIA)    Difficulty of Paying Living Expenses: Not hard at all  Food Insecurity: No Food Insecurity (10/12/2022)   Hunger Vital Sign    Worried About Running Out of Food in the Last Year: Never true    Ran Out of Food in the Last Year: Never true  Transportation Needs: No Transportation Needs (10/12/2022)   PRAPARE - Transportation    Lack of Transportation (Medical): No    Lack of Transportation (Non-Medical): No  Physical Activity: Not on file  Stress: Not on file  Social Connections: Not on file     FAMILY HISTORY:  We obtained a detailed, 4-generation family history.  Significant diagnoses are listed below: Family History  Problem Relation Age   of Onset   Cancer Mother        NOS   Kidney disease Mother    Heart disease Father    Tremor Father    Parkinson's disease Father    Prostate cancer Father    Melanoma Sister    Parkinson's disease Sister    Melanoma Sister        melanoma x2   Prostate cancer Brother    Parkinson's disease Paternal Aunt    Lung cancer Paternal Aunt    Parkinson's disease Paternal Grandmother    Lung cancer Paternal Grandfather      The patient does not have children.  He has one brother and four sisters.  His brother had prostate cancer, two sisters had melanoma, one had it twice.  Both parents are deceased.  The patient's mother died of an unknown cancer.  She had a brother and  sister who were cancer free. Her parents did not have cancer.  The patient's father had prostate cancer.  He had one sister who had lung cancer.  The paternal grandfather had lung cancer.  Mr. Pankratz is unaware of previous family history of genetic testing for hereditary cancer risks. Patient's maternal ancestors are of English descent, and paternal ancestors are of English descent. There is no reported Ashkenazi Jewish ancestry. There is no known consanguinity.  GENETIC COUNSELING ASSESSMENT: Mr. Palo is a 76 y.o. male with a personal and family history of prostate cancer which is somewhat suggestive of a hereditary cancer syndrome and predisposition to cancer given the family history and the BRCA2 somatic mutation. We, therefore, discussed and recommended the following at today's visit.   DISCUSSION: We discussed that, in general, most cancer is not inherited in families, but instead is sporadic or familial. Sporadic cancers occur by chance and typically happen at older ages (>50 years) as this type of cancer is caused by genetic changes acquired during an individual's lifetime. Some families have more cancers than would be expected by chance; however, the ages or types of cancer are not consistent with a known genetic mutation or known genetic mutations have been ruled out. This type of familial cancer is thought to be due to a combination of multiple genetic, environmental, hormonal, and lifestyle factors. While this combination of factors likely increases the risk of cancer, the exact source of this risk is not currently identifiable or testable.  We discussed that up to 15% of prostate cancer is hereditary, with most cases associated with BRCA mutations.  There are other genes that can be associated with hereditary prostate cancer syndromes.  These include ATM, CHEK2 and PALB2.  We discussed that testing is beneficial for several reasons including knowing how to follow individuals after completing  their treatment, identifying whether potential treatment options such as PARP inhibitors would be beneficial, and understand if other family members could be at risk for cancer and allow them to undergo genetic testing.   We reviewed the characteristics, features and inheritance patterns of hereditary cancer syndromes. We also discussed genetic testing, including the appropriate family members to test, the process of testing, insurance coverage and turn-around-time for results. We discussed the implications of a negative, positive, carrier and/or variant of uncertain significant result. Mr. Lenoir  was offered a common hereditary cancer panel (47 genes) and an expanded pan-cancer panel (77 genes). Mr. Chawla was informed of the benefits and limitations of each panel, including that expanded pan-cancer panels contain genes that do not have clear management guidelines at this point   in time.  We also discussed that as the number of genes included on a panel increases, the chances of variants of uncertain significance increases. Mr. Mckey decided to pursue genetic testing for the CancerNext-Expanded+RNAinsight gene panel.   The CancerNext-Expanded gene panel offered by [facility] and includes sequencing and rearrangement analysis for the following 77 genes: AIP, ALK, APC*, ATM*, AXIN2, BAP1, BARD1, BLM, BMPR1A, BRCA1*, BRCA2*, BRIP1*, CDC73, CDH1*, CDK4, CDKN1B, CDKN2A, CHEK2*, CTNNA1, DICER1, FANCC, FH, FLCN, GALNT12, KIF1B, LZTR1, MAX, MEN1, MET, MLH1*, MSH2*, MSH3, MSH6*, MUTYH*, NBN, NF1*, NF2, NTHL1, PALB2*, PHOX2B, PMS2*, POT1, PRKAR1A, PTCH1, PTEN*, RAD51C*, RAD51D*, RB1, RECQL, RET, SDHA, SDHAF2, SDHB, SDHC, SDHD, SMAD4, SMARCA4, SMARCB1, SMARCE1, STK11, SUFU, TMEM127, TP53*, TSC1, TSC2, VHL and XRCC2 (sequencing and deletion/duplication); EGFR, EGLN1, HOXB13, KIT, MITF, PDGFRA, POLD1, and POLE (sequencing only); EPCAM and GREM1 (deletion/duplication only). DNA and RNA analyses performed for * genes.    Based on Mr. Philemon's personal and family history of cancer, he meets medical criteria for genetic testing. Despite that he meets criteria, he may still have an out of pocket cost. We discussed that if his out of pocket cost for testing is over $100, the laboratory will call and confirm whether he wants to proceed with testing.  If the out of pocket cost of testing is less than $100 he will be billed by the genetic testing laboratory.   PLAN: After considering the risks, benefits, and limitations, Mr. Ellena provided informed consent to pursue genetic testing and the blood sample was sent to [facility] for analysis of the CancerNext-Expanded+RNAinsight. Results should be available within approximately 2-3 weeks' time, at which point they will be disclosed by telephone to Mr. Go, as will any additional recommendations warranted by these results. Mr. Duzan will receive a summary of his genetic counseling visit and a copy of his results once available. This information will also be available in Epic.   Lastly, we encouraged Mr. Tully to remain in contact with cancer genetics annually so that we can continuously update the family history and inform him of any changes in cancer genetics and testing that may be of benefit for this family.   Mr. Cybulski's questions were answered to his satisfaction today. Our contact information was provided should additional questions or concerns arise. Thank you for the referral and allowing us to share in the care of your patient.   Karen P. Powell, MS, LCGC Licensed, Certified Genetic Counselor [email or web address] phone: [phone]  The patient was seen for a total of 30 minutes in face-to-face genetic counseling.  The patient was seen alone.  Drs. Feng, Iruku, and/or Gudena were available for questions, if needed..    _______________________________________________________________________ For Office Staff:  Number of people involved in  session: 1 Was an Intern/ student involved with case: no   

## 2022-12-05 NOTE — Progress Notes (Signed)
Hematology and Oncology Follow Up Visit  Frederick Ponce [id number] 1946/04/15 76 y.o. 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  He presented with PSA of 22.5 and a BRCA2 mutation.   Prior Therapy: CT-guided biopsy of a pulmonary nodule obtained on January 15, 2022 which confirmed the presence of prostate cancer.  He is status post radiation therapy to the left sacroiliac pelvic bones completed in February 2023.  He received 30 Gray in 10 fractions  Firmagon 240 mg started on January 23, 2022.  Last injection was given on February 20, 2022.  He is status post radiation therapy to left pelvis completing 30 Gray in 10 fractions in September 2023.  Current therapy:  Eligard 30 mg every 4 months started on March 20, 2022.  This will be repeated today.  Zytiga 1000 mg daily with prednisone 5 mg daily started on March 06, 2022.  Therapy currently on hold since September 2023 due to side effects.  Xgeva 120 mg every 2 months.  This will be given today and repeated every 2 months.     Interim History: Frederick Ponce returns today for repeat follow-up.  Since the last visit, he reports no major changes in his health.  His quality of life continues to be better off Zytiga since he has stopped this medication.  He has reported better appetite and weight gain with better appetite.  His pain is overall manageable although he does have periodic neuropathic component associated with shooting pain and burning on the left thigh.  He continues to ambulate without any major difficulties.  He denies any falls or syncope.    Medications: Updated on review. Current Outpatient Medications  Medication Sig Dispense Refill   amLODipine (NORVASC) 5 MG tablet Take 1 tablet (5 mg total) by mouth daily. 90 tablet 1    atorvastatin (LIPITOR) 40 MG tablet Take 1 tablet (40 mg total) by mouth every morning. 90 tablet 1   calcium-vitamin D (OSCAL WITH D) 500-5 MG-MCG tablet Take 1 tablet by mouth 2 (two) times daily. 60 tablet 3   carbidopa-levodopa (SINEMET) 25-100 MG tablet Take 1 tablet by mouth 4 (four) times daily. Take 1 hour before any meals. 360 tablet 1   COENZYME Q10 PO Take 1 capsule by mouth every morning.     hydrocortisone (ANUSOL-HC) 25 MG suppository Place 1 suppository (25 mg total) rectally daily at 12 noon. 12 suppository 1   ibuprofen (ADVIL) 600 MG tablet Take 600 mg by mouth as needed (pain).     KRILL OIL PO Take 1 capsule by mouth every morning.     morphine (MS CONTIN) 15 MG 12 hr tablet Take 1 tablet (15 mg total) by mouth every 8 (eight) hours. 90 tablet 0   multivitamin-lutein (OCUVITE-LUTEIN) CAPS capsule Take 1 capsule by mouth every morning.     vitamin B-12 (CYANOCOBALAMIN) 1000 MCG tablet Take 1,000 mcg by mouth every morning.     No current facility-administered medications for this visit.   Facility-Administered Medications Ordered in Other Visits  Medication Dose Route Frequency Provider Last Rate Last Admin   denosumab (XGEVA) injection 120 mg  120 mg Subcutaneous Once Frederick Ponce, Mathis Dad, MD       Leuprolide Acetate (4 Month) (ELIGARD) injection 30 mg  30 mg Subcutaneous Once Frederick Portela, MD  Allergies:  Allergies  Allergen Reactions   Gabapentin Other (See Comments)    Restless legs/confusion   Celebrex [Celecoxib] Rash      Physical Exam:    Blood pressure 131/71, pulse 85, temperature (!) 97.5 F (36.4 C), temperature source Temporal, resp. rate 18, height _0  (1.727 m), weight 171 lb 6.4 oz (77.7 kg), SpO2 100 %.    ECOG: 1    General appearance: Comfortable appearing without any discomfort Head: Normocephalic without any trauma Oropharynx: Mucous membranes are moist and pink without any thrush or ulcers. Eyes: Pupils are equal and round  reactive to light. Lymph nodes: No cervical, supraclavicular, inguinal or axillary lymphadenopathy.   Heart:regular rate and rhythm.  S1 and S2 without leg edema. Lung: Clear without any rhonchi or wheezes.  No dullness to percussion. Abdomin: Soft, nontender, nondistended with good bowel sounds.  No hepatosplenomegaly. Musculoskeletal: No joint deformity or effusion.  Full range of motion noted. Neurological: No deficits noted on motor, sensory and deep tendon reflex exam. Skin: No petechial rash or dryness.  Appeared moist.       Lab Results: Lab Results  Component Value Date   WBC 5.3 12/03/2022   HGB 12.1 (L) 12/03/2022   HCT 35.8 (L) 12/03/2022   MCV 99.2 12/03/2022   PLT 196 12/03/2022     Chemistry      Component Value Date/Time   NA 139 12/03/2022 1111   NA 140 09/14/2021 0920   K 4.7 12/03/2022 1111   CL 106 12/03/2022 1111   CO2 25 12/03/2022 1111   BUN 18 12/03/2022 1111   BUN 18 09/14/2021 0920   CREATININE 0.75 12/03/2022 1111      Component Value Date/Time   CALCIUM 9.8 12/03/2022 1111   ALKPHOS 42 12/03/2022 1111   AST 15 12/03/2022 1111   ALT 6 12/03/2022 1111   BILITOT 1.0 12/03/2022 1111      .    Latest Reference Range & Units 09/17/22 08:29 10/02/22 15:49 12/03/22 11:11  Prostate Specific Ag, Serum 0.0 - 4.0 ng/mL 0.5 0.3 0.5       Impression and Plan:  76 year old with:  1.  Advanced prostate cancer with disease to the bone and pulmonary nodules diagnosed in January 2023.  He has castration-sensitive disease with BRCA2 mutation.   The natural course of this disease was reviewed at this time and treatment options were discussed.  His PSA remains under control although it did rise slowly up to 0.5.  Risks and benefits of restarting Zytiga versus a different androgen receptor pathway inhibitor were discussed.  Complication associated with these treatments were discussed including hypertension, weight gain, edema and excessive fatigue.  We  also discussed the role of a PARP inhibitor in the setting especially if he developed castration-resistant disease.  Complications that include nausea, vomiting and myelosuppression.  After discussion today, he opted against any additional therapy and continue to monitor his PSA closely.  He values quality of life that he has achieved off any additional treatment.   2.  Bone directed therapy: His calcium is adequate and will receive Xgeva today and repeated every 2 months.  Complications that include osteonecrosis of the jaw and hypocalcemia were reiterated.   3.  Parkinson's disease: Continue to be stable on the current Sinemet dosing.  4.  Androgen deprivation therapy: He will receive Eligard today and repeated in 4 months.  Complications including weight gain and hot flashes were reiterated.   5.  Bone pain: He continues to  be on long-acting morphine with pain is manageable.     6.  Prognosis and goals of care: N/A treatment is palliative at this time though aggressive measures are warranted given his reasonable performance status.   7.  Follow-up: He will return in 4 weeks for repeat evaluation and recheck his PSA.  He will return in 3 months for Xgeva injection.       30  minutes were dedicated to this encounter.  The time was spent on updating disease status, treatment choices and outlining future plan of care discussion.   Zola Button, MD 12/13/20231:56 PM

## 2022-12-06 ENCOUNTER — Inpatient Hospital Stay: Payer: Medicare Other

## 2022-12-06 DIAGNOSIS — Z5111 Encounter for antineoplastic chemotherapy: Secondary | ICD-10-CM | POA: Diagnosis not present

## 2022-12-06 DIAGNOSIS — C61 Malignant neoplasm of prostate: Secondary | ICD-10-CM

## 2022-12-06 LAB — CBC WITH DIFFERENTIAL (CANCER CENTER ONLY)
Abs Immature Granulocytes: 0.01 10*3/uL (ref 0.00–0.07)
Basophils Absolute: 0 10*3/uL (ref 0.0–0.1)
Basophils Relative: 0 %
Eosinophils Absolute: 0.1 10*3/uL (ref 0.0–0.5)
Eosinophils Relative: 3 %
HCT: 34.7 % — ABNORMAL LOW (ref 39.0–52.0)
Hemoglobin: 11.7 g/dL — ABNORMAL LOW (ref 13.0–17.0)
Immature Granulocytes: 0 %
Lymphocytes Relative: 28 %
Lymphs Abs: 1.4 10*3/uL (ref 0.7–4.0)
MCH: 33.8 pg (ref 26.0–34.0)
MCHC: 33.7 g/dL (ref 30.0–36.0)
MCV: 100.3 fL — ABNORMAL HIGH (ref 80.0–100.0)
Monocytes Absolute: 0.5 10*3/uL (ref 0.1–1.0)
Monocytes Relative: 11 %
Neutro Abs: 2.8 10*3/uL (ref 1.7–7.7)
Neutrophils Relative %: 58 %
Platelet Count: 207 10*3/uL (ref 150–400)
RBC: 3.46 MIL/uL — ABNORMAL LOW (ref 4.22–5.81)
RDW: 13.2 % (ref 11.5–15.5)
WBC Count: 4.9 10*3/uL (ref 4.0–10.5)
nRBC: 0 % (ref 0.0–0.2)

## 2022-12-06 LAB — CMP (CANCER CENTER ONLY)
ALT: <5 U/L (ref 0–44)
AST: 13 U/L — ABNORMAL LOW (ref 15–41)
Albumin: 4.2 g/dL (ref 3.5–5.0)
Alkaline Phosphatase: 42 U/L (ref 38–126)
Anion gap: 5 (ref 5–15)
BUN: 20 mg/dL (ref 8–23)
CO2: 29 mmol/L (ref 22–32)
Calcium: 9.8 mg/dL (ref 8.9–10.3)
Chloride: 106 mmol/L (ref 98–111)
Creatinine: 0.88 mg/dL (ref 0.61–1.24)
GFR, Estimated: >60 mL/min (ref >60)
Glucose, Bld: 113 mg/dL — ABNORMAL HIGH (ref 70–99)
Potassium: 4.3 mmol/L (ref 3.5–5.1)
Sodium: 140 mmol/L (ref 135–145)
Total Bilirubin: 0.9 mg/dL (ref 0.3–1.2)
Total Protein: 6.3 g/dL — ABNORMAL LOW (ref 6.5–8.1)

## 2022-12-06 LAB — GENETIC SCREENING ORDER

## 2022-12-08 LAB — PROSTATE-SPECIFIC AG, SERUM (LABCORP): Prostate Specific Ag, Serum: 0.6 ng/mL (ref 0.0–4.0)

## 2022-12-10 ENCOUNTER — Ambulatory Visit (INDEPENDENT_AMBULATORY_CARE_PROVIDER_SITE_OTHER): Payer: Medicare Other | Admitting: Psychologist

## 2022-12-10 ENCOUNTER — Telehealth: Payer: Self-pay | Admitting: Oncology

## 2022-12-10 DIAGNOSIS — F331 Major depressive disorder, recurrent, moderate: Secondary | ICD-10-CM

## 2022-12-10 NOTE — Progress Notes (Signed)
[facility] Counselor/Therapist Progress Note  Patient ID: Frederick Ponce, MRN: [id number],    Date: 12/10/2022  Time Spent: 10:06 am to 10:44 am; total time: 38 minutes   This session was held via in person. The patient consented to in-person therapy and was in the clinician's office. Limits of confidentiality were discussed with the patient.    Treatment Type: Individual Therapy  Reported Symptoms: Distress due to seeing an image of his father at the cancer center  Mental Status Exam: Appearance:  Well Groomed     Behavior: Appropriate  Motor: Normal  Speech/Language:  Clear and Coherent  Affect: Appropriate  Mood: normal  Thought process: normal  Thought content:   WNL  Sensory/Perceptual disturbances:   WNL  Orientation: oriented to person, place, and time/date  Attention: Good  Concentration: Good  Memory: WNL  Fund of knowledge:  Good  Insight:   Fair  Judgment:  Good  Impulse Control: Good   Risk Assessment: Danger to Self:  No Self-injurious Behavior: No Danger to Others: No Duty to Warn:no Physical Aggression / Violence:No  Access to Firearms a concern: No  Gang Involvement:No   Subjective: Beginning the session, patient described himself as doing okay. Elaborating, he disclosed that he received positive news from his last oncology appointment. From there, he talked about how he had a vision of his father in the cancer center, which has caused a strong emotional reaction. Patient's father died from prostate cancer. Patient spent the rest of the session reflecting on this experience.  He was agreeable to homework and following up. He denied suicidal and homicidal ideation.    Interventions:  Worked on developing a therapeutic relationship with the patient using active listening and reflective statements. Provided emotional support using empathy and validation. Reviewed the treatment plan with the patient. Used summary and reflective statements.  Praised the patient for having a positive oncology appointment. Provided psychoeducation about the difference between "retardation" and "intellectual disability". Processed thoughts and emotions related to the positive news. Normalized and validated emotions related to the vision of seeing his father.  Assisted the patient in doing a life review. Used socratic questions to asisst the patient gain insight into self. Normalized the emotions. Provided empathic statements. Assigned homework. Assessed for suicidal and homicidal ideation.   Homework: Reflect on vision of seeing father  Next Session: Review homework and emotional support. Process fears of death  Diagnosis: F33.1 major depressive affective disorder, recurrent, moderate  Plan:   Goals Work through the grieving process and face reality of own death Accept emotional support from others around them Live life to the fullest, event though time may be limited Become as knowledgeable about the medical condition  Reduce fear, anxiety about the health condition  Accept the illness Accept the role of psychological and behavioral factors  Stabilize anxiety level wile increasing ability to function Learn and implement coping skills that result in a reduction of anxiety  Alleviate depressive symptoms Recognize, accept, and cope with depressive feelings Develop healthy thinking patterns Develop healthy interpersonal relationships  Objectives target date for all objectives is 11/10/2023 Identify feelings associated with the illness Family members share with each other feelings Identify the losses or limitations that have been experienced Verbalize acceptance of the reality of the medical condition Commit to learning and implement a proactive approach to managing personal stresses Verbalize an understanding of the medical condition Work with therapist to develop a plan for coping with stress Learn and implement skills for managing  stress Engage in social, productive activities that are possible Engage in faith based activities implement positive imagery Identify coping skills and sources of emotional support Patient's partner and family members verbalize their fears regarding severity of health condition Identify sources of emotional distress  Learning and implement calming skills to reduce overall anxiety Learn and implement problem solving strategies Identify and engage in pleasant activities Learning and implement personal and interpersonal skills to reduce anxiety and improve interpersonal relationships Learn to accept limitations in life and commit to tolerating, rather than avoiding, unpleasant emotions while accomplishing meaningful goals Identify major life conflicts from the past and present that form the basis for present anxiety Learn and implement behavioral strategies Verbalize an understanding and resolution of current interpersonal problems Learn and implement problem solving and decision making skills Learn and implement conflict resolution skills to resolve interpersonal problems Verbalize an understanding of healthy and unhealthy emotions verbalize insight into how past relationships may be influence current experiences with depression Use mindfulness and acceptance strategies and increase value based behavior  Increase hopeful statements about the future.   Interventions Teach about stress and ways to handle stress Assist the patient in developing a coping action plan for stressors Conduct skills based training for coping strategies Train problem focused skills Sort out what activities the individual can do Encourage patient to rely upon his/her spiritual faith Teach the patient to use guided imagery Probe and evaluate family's ability to provide emotional support Allow family to share their fears Assist the patient in identifying, sorting through, and verbalizing the various feelings  generated by his/her medical condition Meet with family members  Ask patient list out limitations  Use stress inoculation training  Use Acceptance and Commitment Therapy to help client accept uncomfortable realities in order to accomplish value-consistent goals Reinforce the client's insight into the role of his/her past emotional pain and present anxiety  Discuss examples demonstrating that unrealistic worry overestimates the probability of threats and underestimate patient's ability  Assist the patient in analyzing his or her worries Help patient understand that avoidance is reinforcing  Behavioral activation help the client explore the relationship, nature of the dispute,  Help the client develop new interpersonal skills and relationships Conduct Problem so living therapy Teach conflict resolution skills Use a process-experiential approach Conduct TLDP Conduct ACT  The patient and clinician reviewed the treatment plan on 11/13/2022. The patient approved of the treatment plan.   Conception Chancy, PsyD

## 2022-12-10 NOTE — Telephone Encounter (Signed)
Called patient regarding January and February appointments, patient is notified.  

## 2022-12-21 ENCOUNTER — Encounter: Payer: Self-pay | Admitting: Genetic Counselor

## 2022-12-21 DIAGNOSIS — Z1379 Encounter for other screening for genetic and chromosomal anomalies: Secondary | ICD-10-CM | POA: Insufficient documentation

## 2022-12-25 ENCOUNTER — Encounter: Payer: Self-pay | Admitting: Genetic Counselor

## 2022-12-25 ENCOUNTER — Telehealth: Payer: Self-pay | Admitting: Genetic Counselor

## 2022-12-25 ENCOUNTER — Telehealth: Payer: Self-pay | Admitting: Oncology

## 2022-12-25 ENCOUNTER — Ambulatory Visit: Payer: Self-pay | Admitting: Genetic Counselor

## 2022-12-25 DIAGNOSIS — C61 Malignant neoplasm of prostate: Secondary | ICD-10-CM

## 2022-12-25 DIAGNOSIS — Z1379 Encounter for other screening for genetic and chromosomal anomalies: Secondary | ICD-10-CM

## 2022-12-25 NOTE — Telephone Encounter (Signed)
Called patient regarding upcoming January appointments, left a voicemail. 

## 2022-12-25 NOTE — Telephone Encounter (Signed)
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  This is consistent with his [facility] tumor testing.  Discussed that he would be eligible for PARP based on this test result.  He will discuss with Dr. Alen Blew at his visit next week. Also explained that he is at increased risk for male breast cancer and should do self breast exams.  His siblings are at 50% risk for this mutation and should consider genetic testing.  If testing is performed within 90 days the laboratory will not bill his family members for testing for this single mutation.  He will also try to contact his brother's son regarding this mutation.

## 2022-12-25 NOTE — Progress Notes (Signed)
GENETIC TEST RESULTS   Patient Name: Frederick Ponce Patient Age: 77 y.o. Encounter Date: 12/25/2022  Referring Provider: Zola Button, MD    Frederick Ponce was seen in the [facility] on December 05, 2022 due to a personal and family history of cancer and concern regarding a hereditary predisposition to cancer in the family based on the family history and an identified BRCA2 mutation on somatic testing through [facility]. Please refer to the prior Genetics clinic note for more information regarding Frederick Ponce medical and family histories and our assessment at the time.   FAMILY HISTORY:  We obtained a detailed, 4-generation family history.  Significant diagnoses are listed below: Family History  Problem Relation Age of Onset   Cancer Mother        NOS   Kidney disease Mother    Heart disease Father    Tremor Father    Parkinson's disease Father    Prostate cancer Father    Melanoma Sister    Parkinson's disease Sister    Melanoma Sister        melanoma x2   Prostate cancer Brother    Parkinson's disease Paternal Aunt    Lung cancer Paternal Aunt    Parkinson's disease Paternal Grandmother    Lung cancer Paternal Grandfather        The patient does not have children.  He has one brother and four sisters.  His brother had prostate cancer, two sisters had melanoma, one had it twice.  Both parents are deceased.   The patient's mother died of colon cancer and sepsis.  She had a brother and sister who were cancer free. Her parents did not have cancer.   The patient's father had prostate cancer.  He had one sister who had lung cancer.  The paternal grandfather had lung cancer.   Frederick Ponce is unaware of previous family history of genetic testing for hereditary cancer risks. Patient's maternal ancestors are of [place] descent, and paternal ancestors are of [place] descent. There is no reported Ashkenazi Jewish ancestry. There is no known consanguinity  GENETIC TESTING:   Frederick Ponce tested positive for a single pathogenic variant (harmful genetic change) in the BRCA2 gene. Specifically, this variant is BRCA2 p.[id number]* (c.[id number]).    The test report has been scanned into EPIC and is located under the Molecular Pathology section of the Results Review tab.? A portion of the result report is included below for reference. Genetic testing reported out on December 21, 2022.    Clinical Information:   Hereditary breast and ovarian cancer (HBOC) syndrome is characterized by an increased lifetime risk for, generally, adult-onset cancers including, breast, contralateral breast, male breast, ovarian, prostate, melanoma and pancreatic.   The cancers associated with BRCA2 are:   Male breast cancer, up to an 84% risk  In women with a history of breast cancer, the risk for contralateral breast cancer 10 years after breast cancer diagnosis is 10-30%.    Male breast cancer, up to an 8% risk  Ovarian cancer, 13-29% risk  Pancreatic cancer, 5-10% risk  Prostate cancer, 19-61% risk  Melanoma, elevated risk   Management Recommendations:   Breast Screening/Risk Reduction:   Women:  Breast awareness starting at age 51  Clinical breast examination every 6-12 months starting at age 49   Breast cancer screening:  Age 38-29 years, annual breast MRI with and without contrast (or mammogram, if MRI is unavailable), although the age to initiate screening may be individualized based on family history  Age 4-75 years, annual mammogram and breast MRI with and without contrast  Age >75 years, management should be considered on an individual basis  For women with a BRCA2 pathogenic or likely pathogenic variant who are treated for breast cancer and have not had a bilateral mastectomy, screening with annual mammogram and breast MRI should continue as described above.  The option of prophylactic bilateral risk-reducing mastectomy (RRM), removal of the breast tissue before cancer develops,  is the best option for significantly decreasing the risk of developing breast cancer. Studies have shown mastectomies reduce the risk of breast cancer by 90-95% in women with a BRCA2 mutation.    Males:  Breast self-exam training and education starting at age 67 years  Annual clinical breast exam starting at age 2 years   Consider annual mammogram starting at age 18 or 70 years before the earliest known male breast cancer in the family (whichever comes first).   Gynecological Cancer Screening/Risk Reduction:  It is recommended that women with a BRCA2 mutation have a risk-reducing salpingo oophorectomy (RRSO), removal of the ovaries and fallopian tubes. It is reasonable to delay RRSO until age 34-45 years unless age at diagnosis in the family warrants earlier age for consideration of RRSO.   Having a RRSO is estimated to reduce the risk of ovarian cancer by up to 96%. There is still a small risk of developing an "ovarian-like" cancer in the lining of the abdomen, called the peritoneum.  Another benefit to having the ovaries removed is the risk reduction for breast cancer. If the ovaries are removed before menopause, the risk of developing breast cancer is reduced.  Ovarian cancer screening is an option for women who chose not to have a RRSO or who have not yet completed their family. Current screening methods for ovarian cancer are neither sensitive nor specific, meaning that often early stage ovarian cancer cannot be diagnosed through this screening.  Screening can also be falsely positive with no cancer present. For this reason, RRSO is recommended over screening. If ovarian cancer screening is recommended by a physician, it could include:  CA-125 blood tests  Transvaginal ultrasounds  Clinical pelvic exams   Skin Cancer Screening and Risk Reduction:  Regular skin self-examinations  Individuals should notify their physicians of any changes to moles such as increasing in size, darkening in color,  or other change in appearance.  Annual skin examinations by a dermatologist   Follow sun-safety recommendations such as:  Using UVA and UVB 30 SPF or higher sunscreen  Avoiding sunburns  Limiting sun exposure, especially during the hours of 11am-4pm   Wearing protective clothing and sunglasses  Avoid using tanning beds  For more information about the prevention of melanoma visit [email or web address]   Prostate Cancer Screening:  Annual digital rectal exam (DRE) at age 41  Annual PSA blood test at age 12   Pancreatic Cancer Screening/Risk Reduction:  Avoid smoking, heavy alcohol use, and obesity.  It has been suggested that pancreatic cancer screening be limited to those with a family history of pancreatic cancer (first- or second-degree relative). Ideally, screening should be performed in experienced centers utilizing a multidisciplinary approach under research conditions. Recommended screening could include annual endoscopic ultrasound (preferred) and/or MRI of the pancreas starting at age 91 or 54 years younger than the earliest age diagnosis in the family.   Additional Considerations:  Individuals at risk for developing breast and ovarian cancer may benefit from the use of medication to reduce their risk for cancer. These medications  are referred to as chemoprevention. For example, oral contraceptive use has been shown to reduce the risk of ovarian cancer by approximately 60% in BRCA2 mutation carriers if taken for at least 5 years. This risk reduction remains even after discontinuation of oral contraceptives.  Recent studies have suggested PARP inhibitors may be a beneficial chemotherapeutic agent for a subset of patients with BRCA2-associated breast, ovarian, prostate, and pancreatic cancers. Clinical trials are currently in process to determine if and how these agents can be useful in the treatment of BRCA2 cancer patients  Patients of reproductive age should be made aware of options for  prenatal diagnosis and assisted reproduction including pre-implantation genetic diagnosis.  Individuals with a single pathogenic BRCA2 variant are carriers of Fanconi anemia. Fanconi anemia is characterized by developmental delay apparent from infancy, short stature, microcephaly, and coarse dysmorphic features. For there to be a risk of Fanconi anemia in offspring, both the patient and their partner would each have to carry a pathogenic variant in [place]. In this case, the risk of having an affected child is 25%.    This information is based on current understanding of the gene and may change in the future.   Implications for Family Members:  Hereditary predisposition to cancer due to pathogenic variants in the BRCA2 gene has autosomal dominant inheritance. This means that an individual with a pathogenic variant has a 50% chance of passing the condition on to his/her offspring. Identification of a pathogenic variant allows for the recognition of at-risk relatives who can pursue testing for the familial variant.   Family members are encouraged to consider genetic testing for this familial pathogenic variant. As there are generally no childhood cancer risks associated with a single pathogenic variant in the BRCA2 gene, individuals in the family are not recommended to have testing until they reach at least 78 years of age. They may contact our office at [phone] for more information or to schedule an appointment. Complimentary testing for the familial variant is available for 90 days after the genetic testing report date.  Family members who live outside of the area are encouraged to find a genetic counselor in their area by visiting: [email or web address].   Resources:  FORCE (Facing Our Risk of Cancer Empowered) is a resource for those with a hereditary predisposition to develop cancer.  FORCE provides information about risk reduction, advocacy, legislation, and clinical trials.   Additionally, FORCE provides a platform for collaboration and support; which includes: peer navigation, message boards, local support groups, a toll-free help line, research registry and recruitment, advocate training, published medical research, webinars, brochures, mastectomy photos, and more.  For more information, visit www.facingourrisk.org   We encouraged Mr. Schoenfelder to remain in contact with [place] on an annual basis so we can update his personal and family histories, and let him know of advances in cancer genetics that may benefit the family. Our contact number was provided. Mr. Blahut questions were answered to his satisfaction today, and he knows he is welcome to call anytime with additional questions.   Elsey Holts P. [place]ne [place], [place], [facility] Licensed, Insurance risk surveyor [email or web address] phone: [phone]

## 2022-12-26 ENCOUNTER — Other Ambulatory Visit: Payer: Self-pay | Admitting: Nurse Practitioner

## 2022-12-26 ENCOUNTER — Telehealth: Payer: Self-pay | Admitting: Neurology

## 2022-12-26 NOTE — Telephone Encounter (Signed)
LVM and sent mychart msg informing pt of r/s needed for 2/22 appt- Judson Roch out. Can offer 02/11/23 if still available.

## 2022-12-27 ENCOUNTER — Telehealth: Payer: Self-pay | Admitting: *Deleted

## 2022-12-27 ENCOUNTER — Ambulatory Visit: Payer: Medicare Other | Admitting: *Deleted

## 2022-12-27 NOTE — Progress Notes (Signed)
Pt decided  to cancel procedure at this time due to CA issues. He is not sure this is something he needs at this time. He is to see his oncologist on 1/13 and discuss this with MD. He states he will call back if it is something that is decided on with his Oncologist MD.

## 2022-12-27 NOTE — Telephone Encounter (Signed)
12/27/22 1st call Gave # for call back and informned pt that I will try back in 5 minutes

## 2022-12-31 ENCOUNTER — Telehealth: Payer: Self-pay | Admitting: Oncology

## 2022-12-31 NOTE — Telephone Encounter (Signed)
Called patient per 1/8 in basket. Patient r/s and notified.

## 2023-01-01 ENCOUNTER — Other Ambulatory Visit: Payer: Self-pay

## 2023-01-01 ENCOUNTER — Inpatient Hospital Stay: Payer: Medicare Other | Attending: Nurse Practitioner

## 2023-01-01 ENCOUNTER — Other Ambulatory Visit: Payer: Medicare Other

## 2023-01-01 DIAGNOSIS — Z7952 Long term (current) use of systemic steroids: Secondary | ICD-10-CM | POA: Diagnosis not present

## 2023-01-01 DIAGNOSIS — C61 Malignant neoplasm of prostate: Secondary | ICD-10-CM | POA: Diagnosis present

## 2023-01-01 DIAGNOSIS — Z923 Personal history of irradiation: Secondary | ICD-10-CM | POA: Insufficient documentation

## 2023-01-01 DIAGNOSIS — C7951 Secondary malignant neoplasm of bone: Secondary | ICD-10-CM | POA: Insufficient documentation

## 2023-01-01 DIAGNOSIS — Z79899 Other long term (current) drug therapy: Secondary | ICD-10-CM | POA: Diagnosis not present

## 2023-01-01 LAB — CMP (CANCER CENTER ONLY)
ALT: 7 U/L (ref 0–44)
AST: 14 U/L — ABNORMAL LOW (ref 15–41)
Albumin: 4.3 g/dL (ref 3.5–5.0)
Alkaline Phosphatase: 43 U/L (ref 38–126)
Anion gap: 5 (ref 5–15)
BUN: 24 mg/dL — ABNORMAL HIGH (ref 8–23)
CO2: 29 mmol/L (ref 22–32)
Calcium: 9.7 mg/dL (ref 8.9–10.3)
Chloride: 106 mmol/L (ref 98–111)
Creatinine: 0.85 mg/dL (ref 0.61–1.24)
GFR, Estimated: >60 mL/min (ref >60)
Glucose, Bld: 96 mg/dL (ref 70–99)
Potassium: 4.9 mmol/L (ref 3.5–5.1)
Sodium: 140 mmol/L (ref 135–145)
Total Bilirubin: 0.6 mg/dL (ref 0.3–1.2)
Total Protein: 6.8 g/dL (ref 6.5–8.1)

## 2023-01-01 LAB — CBC WITH DIFFERENTIAL (CANCER CENTER ONLY)
Abs Immature Granulocytes: 0 10*3/uL (ref 0.00–0.07)
Basophils Absolute: 0 10*3/uL (ref 0.0–0.1)
Basophils Relative: 0 %
Eosinophils Absolute: 0.3 10*3/uL (ref 0.0–0.5)
Eosinophils Relative: 6 %
HCT: 35.1 % — ABNORMAL LOW (ref 39.0–52.0)
Hemoglobin: 11.6 g/dL — ABNORMAL LOW (ref 13.0–17.0)
Immature Granulocytes: 0 %
Lymphocytes Relative: 34 %
Lymphs Abs: 1.6 10*3/uL (ref 0.7–4.0)
MCH: 33.1 pg (ref 26.0–34.0)
MCHC: 33 g/dL (ref 30.0–36.0)
MCV: 100.3 fL — ABNORMAL HIGH (ref 80.0–100.0)
Monocytes Absolute: 0.5 10*3/uL (ref 0.1–1.0)
Monocytes Relative: 11 %
Neutro Abs: 2.3 10*3/uL (ref 1.7–7.7)
Neutrophils Relative %: 49 %
Platelet Count: 201 10*3/uL (ref 150–400)
RBC: 3.5 MIL/uL — ABNORMAL LOW (ref 4.22–5.81)
RDW: 13 % (ref 11.5–15.5)
WBC Count: 4.6 10*3/uL (ref 4.0–10.5)
nRBC: 0 % (ref 0.0–0.2)

## 2023-01-01 LAB — GENETIC SCREENING ORDER

## 2023-01-02 ENCOUNTER — Ambulatory Visit: Payer: Medicare Other | Admitting: Psychologist

## 2023-01-02 LAB — PROSTATE-SPECIFIC AG, SERUM (LABCORP): Prostate Specific Ag, Serum: 1 ng/mL (ref 0.0–4.0)

## 2023-01-03 ENCOUNTER — Inpatient Hospital Stay (HOSPITAL_BASED_OUTPATIENT_CLINIC_OR_DEPARTMENT_OTHER): Payer: Medicare Other | Admitting: Oncology

## 2023-01-03 ENCOUNTER — Encounter: Payer: Self-pay | Admitting: Nurse Practitioner

## 2023-01-03 ENCOUNTER — Inpatient Hospital Stay (HOSPITAL_BASED_OUTPATIENT_CLINIC_OR_DEPARTMENT_OTHER): Payer: Medicare Other | Admitting: Nurse Practitioner

## 2023-01-03 ENCOUNTER — Ambulatory Visit (INDEPENDENT_AMBULATORY_CARE_PROVIDER_SITE_OTHER): Payer: Medicare Other

## 2023-01-03 ENCOUNTER — Other Ambulatory Visit (HOSPITAL_COMMUNITY): Payer: Self-pay

## 2023-01-03 ENCOUNTER — Other Ambulatory Visit: Payer: Self-pay

## 2023-01-03 VITALS — BP 135/68 | HR 77 | Temp 97.5°F | Resp 18 | Wt 174.6 lb

## 2023-01-03 VITALS — Ht 68.0 in | Wt 172.0 lb

## 2023-01-03 DIAGNOSIS — C61 Malignant neoplasm of prostate: Secondary | ICD-10-CM | POA: Diagnosis not present

## 2023-01-03 DIAGNOSIS — C7951 Secondary malignant neoplasm of bone: Secondary | ICD-10-CM | POA: Diagnosis not present

## 2023-01-03 DIAGNOSIS — N390 Urinary tract infection, site not specified: Secondary | ICD-10-CM

## 2023-01-03 DIAGNOSIS — Z0000 Encounter for general adult medical examination without abnormal findings: Secondary | ICD-10-CM | POA: Diagnosis not present

## 2023-01-03 DIAGNOSIS — G893 Neoplasm related pain (acute) (chronic): Secondary | ICD-10-CM

## 2023-01-03 DIAGNOSIS — Z515 Encounter for palliative care: Secondary | ICD-10-CM | POA: Diagnosis not present

## 2023-01-03 DIAGNOSIS — K5903 Drug induced constipation: Secondary | ICD-10-CM | POA: Diagnosis not present

## 2023-01-03 MED ORDER — PREDNISONE 5 MG PO TABS: 5.0000 mg | ORAL_TABLET | Freq: Every day | ORAL | 1 refills | Status: DC

## 2023-01-03 MED ORDER — ABIRATERONE ACETATE 250 MG PO TABS
500.0000 mg | ORAL_TABLET | Freq: Every day | ORAL | 1 refills | Status: DC
  Filled 2023-01-03: qty 120, 60d supply, fill #0

## 2023-01-03 MED ORDER — MORPHINE SULFATE ER 15 MG PO TBCR: 15.0000 mg | EXTENDED_RELEASE_TABLET | Freq: Three times a day (TID) | ORAL | 0 refills | Status: DC

## 2023-01-03 NOTE — Progress Notes (Signed)
[place]  [phone] [phone]) [phone]   Name: Frederick Ponce Date: 01/03/2023 MRN: [id number]  DOB: 14-Oct-1946  Patient Care Team: Jimmy Footman, NP as PCP - General (Nurse Practitioner) Debera Lat, Frederick Sax, NP as Nurse Practitioner (Nurse Practitioner) Duffy, Rodman Pickle, LCSW as Social Worker (Licensed Clinical Social Worker)   REASON FOR CONSULTATION: Frederick Ponce is a 77 y.o. male with medical history including Parkinson's disease and hyperlipidemia. 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 He is having urgent radiation appointment due to iliac bone pain. Palliative ask to see for symptom management.    SOCIAL HISTORY:     reports that he has never smoked. He has never used smokeless tobacco. He reports current alcohol use of about 15.0 standard drinks of alcohol per week. He reports that he does not use drugs.  ADVANCE DIRECTIVES:  Patient is scheduled to complete advanced directives at our clinic here at the cancer center on 02/16/2022. MOST form provided.  Patient plans to review and complete at a later time.   CODE STATUS:   PAST MEDICAL HISTORY: Past Medical History:  Diagnosis Date   Arthritis    Depression    Family history of melanoma    Family history of prostate cancer    GERD (gastroesophageal reflux disease)    Hyperlipidemia    Hypertension    Parkinson disease    Prostate cancer ([place])     PAST SURGICAL HISTORY: No past surgical history on file.  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  The report date is December 21, 2022.  The CancerNext-Expanded gene panel offered by [facility] and includes sequencing and rearrangement analysis for the following 77 genes: AIP, ALK, APC*, ATM*, AXIN2, BAP1, BARD1, BLM, BMPR1A, BRCA1*, BRCA2*, BRIP1*, CDC73, CDH1*, CDK4, CDKN1B, CDKN2A, CHEK2*, CTNNA1, DICER1, FANCC, FH, FLCN, GALNT12, KIF1B, LZTR1, MAX, MEN1, MET, MLH1*, MSH2*, MSH3, MSH6*, MUTYH*, NBN, NF1*, NF2, NTHL1, PALB2*, PHOX2B, PMS2*, POT1, PRKAR1A, PTCH1, PTEN*, RAD51C*, RAD51D*, RB1, RECQL, RET, SDHA, SDHAF2, SDHB, SDHC, SDHD, SMAD4, SMARCA4, SMARCB1, SMARCE1, STK11, SUFU, TMEM127, TP53*, TSC1, TSC2, VHL and XRCC2 (sequencing and deletion/duplication); EGFR, EGLN1, HOXB13, KIT, MITF, PDGFRA, POLD1, and POLE (sequencing only); EPCAM and GREM1 (deletion/duplication only). DNA and RNA analyses performed for * genes.      ALLERGIES:  is allergic to gabapentin and celebrex [celecoxib].  MEDICATIONS:  Current Outpatient Medications  Medication Sig Dispense Refill   amLODipine (NORVASC) 5 MG tablet Take 1 tablet (5 mg total) by mouth daily. 90 tablet 1   atorvastatin (LIPITOR) 40 MG tablet Take 1 tablet (40 mg total) by mouth every morning. 90 tablet 1   calcium-vitamin D (OSCAL WITH D) 500-5 MG-MCG tablet Take 1 tablet by mouth 2 (two) times daily. 60 tablet 3   carbidopa-levodopa (SINEMET) 25-100 MG tablet Take 1 tablet by mouth 4 (four) times daily. Take 1 hour before any meals. 360 tablet 1  COENZYME Q10 PO Take 1 capsule by mouth every morning.     hydrocortisone (ANUSOL-HC) 25 MG suppository Place 1 suppository (25 mg total) rectally daily at 12 noon. 12 suppository 1   ibuprofen (ADVIL) 600 MG tablet Take 600 mg by mouth as needed (pain).     KLOR-CON M20 20 MEQ tablet  TAKE 1 TABLET BY MOUTH DAILY 30 tablet 0   KRILL OIL PO Take 1 capsule by mouth every morning.     morphine (MS CONTIN) 15 MG 12 hr tablet Take 1 tablet (15 mg total) by mouth every 8 (eight) hours. 90 tablet 0   multivitamin-lutein (OCUVITE-LUTEIN) CAPS capsule Take 1 capsule by mouth every morning.     vitamin B-12 (CYANOCOBALAMIN) 1000 MCG tablet Take 1,000 mcg by mouth every morning.     No current facility-administered medications for this visit.    PERFORMANCE STATUS (ECOG) : 1 - Symptomatic but completely ambulatory   IMPRESSION: Frederick Ponce presents to clinic today. Frederick Ponce is present with him. No acute distress. He is remaining as active as possible. Shares his appreciation of enjoying his Christmas holidays. He was able to decorate and host a Christmas party which he enjoys doing.   Appetite continues to be good. Denies any nausea, vomiting, constipation, or diarrhea.  He was started on Myrabetriq by his Urologist. Has noticed improvement.   We discussed his ongoing pain and discomfort.  Feels this is well controlled with MS Contin 3 times daily.  Cymbalta 20 mg daily. Will take Tylenol for arthritic pain. Is taking medications as prescribed.   Frederick Ponce shares his PSA levels have increased. He was seen by Dr. Alen Blew today. He plans to restart oral medication at half the dose. He is concerned about his quality of life. Understands his options for treatment. Frederick Ponce is clear in his expressed wishes to treat the treatable allowing him every opportunity to thrive but also does not wish to suffer.   PLAN: MS Contin 15 mg  three times daily  Tylenol extra strength as needed Cymbalta 20 mg daily Prune juice, tea, Dulcolax daily for bowel regimen  I will see patient back in 4-6 weeks in collaboration to other oncology appointments.   Patient expressed understanding and was in agreement with this plan. He also understands that He can call the clinic at any time with any questions, concerns, or  complaints.    Any controlled substances utilized were prescribed in the context of palliative care. PDMP has been reviewed.    Time Total: 45 min   Visit consisted of counseling and education dealing with the complex and emotionally intense issues of symptom management and palliative care in the setting of serious and potentially life-threatening illness.Greater than 50%  of this time was spent counseling and coordinating care related to the above assessment and plan.  Alda Lea, AGPCNP-BC  Palliative Medicine Team/[facility]

## 2023-01-03 NOTE — Progress Notes (Signed)
I connected with Frederick Ponce today by telephone and verified that I am speaking with the correct person using two identifiers. Location patient: home Location provider: work Persons participating in the virtual visit: Frederick Ponce, Glenna Durand LPN.   I discussed the limitations, risks, security and privacy concerns of performing an evaluation and management service by telephone and the availability of in person appointments. I also discussed with the patient that there may be a patient responsible charge related to this service. The patient expressed understanding and verbally consented to this telephonic visit.    Interactive audio and video telecommunications were attempted between this provider and patient, however failed, due to patient having technical difficulties OR patient did not have access to video capability.  We continued and completed visit with audio only.     Vital signs may be patient reported or missing.  Subjective:   Frederick Ponce is a 77 y.o. male who presents for Medicare Annual/Subsequent preventive examination.  Review of Systems     Cardiac Risk Factors include: advanced age (>30mn, >>67women);dyslipidemia;male gender     Objective:    Today's Vitals   01/03/23 0857  Weight: 172 lb (78 kg)  Height: '5\' 8"'$  (1.727 m)   Body mass index is 26.15 kg/m.     01/03/2023    9:00 AM 08/29/2022    4:12 AM 08/08/2022    4:11 PM 07/21/2022    9:25 PM 06/08/2022    1:45 PM 03/13/2022   11:31 AM 02/16/2022   11:47 AM  Advanced Directives  Does Patient Have a Medical Advance Directive? Yes No No No Yes No Yes  Type of AParamed[place]of [place] will    Living will;Healthcare Power of [place]  Copy of [place] Chart? Yes - validated most recent copy scanned in chart (See row information)        Would patient like information on creating a medical advance directive?  No -  Patient declined Yes (ED - Information included in AVS)        Current Medications (verified) Outpatient Encounter Medications as of 01/03/2023  Medication Sig   amLODipine (NORVASC) 5 MG tablet Take 1 tablet (5 mg total) by mouth daily.   atorvastatin (LIPITOR) 40 MG tablet Take 1 tablet (40 mg total) by mouth every morning.   calcium-vitamin D (OSCAL WITH D) 500-5 MG-MCG tablet Take 1 tablet by mouth 2 (two) times daily.   carbidopa-levodopa (SINEMET) 25-100 MG tablet Take 1 tablet by mouth 4 (four) times daily. Take 1 hour before any meals.   COENZYME Q10 PO Take 1 capsule by mouth every morning.   ibuprofen (ADVIL) 600 MG tablet Take 600 mg by mouth as needed (pain).   KLOR-CON M20 20 MEQ tablet TAKE 1 TABLET BY MOUTH DAILY   KRILL OIL PO Take 1 capsule by mouth every morning.   morphine (MS CONTIN) 15 MG 12 hr tablet Take 1 tablet (15 mg total) by mouth every 8 (eight) hours.   multivitamin-lutein (OCUVITE-LUTEIN) CAPS capsule Take 1 capsule by mouth every morning.   vitamin B-12 (CYANOCOBALAMIN) 1000 MCG tablet Take 1,000 mcg by mouth every morning.   hydrocortisone (ANUSOL-HC) 25 MG suppository Place 1 suppository (25 mg total) rectally daily at 12 noon. (Patient not taking: Reported on 01/03/2023)   No facility-administered encounter medications on file as of 01/03/2023.    Allergies (verified) Gabapentin and Celebrex [celecoxib]   History: Past Medical History:  Diagnosis Date   Arthritis    Depression    Family history of melanoma    Family history of prostate cancer    GERD (gastroesophageal reflux disease)    Hyperlipidemia    Hypertension    Parkinson disease    Prostate cancer ([place])    History reviewed. No pertinent surgical history. Family History  Problem Relation Age of Onset   Kidney disease Mother    Colon cancer Mother    Heart disease Father    Tremor Father    Parkinson's disease Father    Prostate cancer Father    Melanoma Sister    Parkinson's  disease Sister    Melanoma Sister        melanoma x2   Prostate cancer Brother    Parkinson's disease Paternal Grandmother    Lung cancer Paternal Grandfather    Parkinson's disease Paternal Aunt    Lung cancer Paternal Aunt    Social History   Socioeconomic History   Marital status: Married    Spouse name: Brynda Rim   Number of children: 0   Years of education: Not on file   Highest education level: Associate degree: academic program  Occupational History   Not on file  Tobacco Use   Smoking status: Never   Smokeless tobacco: Never  Vaping Use   Vaping Use: Never used  Substance and Sexual Activity   Alcohol use: Yes    Alcohol/week: 15.0 standard drinks of alcohol    Types: 15 Glasses of wine per week    Comment: couple glasses a night   Drug use: Never   Sexual activity: Not Currently    Birth control/protection: None  Other Topics Concern   Not on file  Social History Narrative   Not on file   Social Determinants of Health   Financial Resource Strain: Low Risk  (01/03/2023)   Overall Financial Resource Strain (CARDIA)    Difficulty of Paying Living Expenses: Not hard at all  Food Insecurity: No Food Insecurity (01/03/2023)   Hunger Vital Sign    Worried About Running Out of Food in the Last Year: Never true    Ran Out of Food in the Last Year: Never true  Transportation Needs: No Transportation Needs (01/03/2023)   PRAPARE - Hydrologist (Medical): No    Lack of Transportation (Non-Medical): No  Physical Activity: Inactive (01/03/2023)   Exercise Vital Sign    Days of Exercise per Week: 0 days    Minutes of Exercise per Session: 0 min  Stress: No Stress Concern Present (01/03/2023)   [place]    Feeling of Stress : Not at all  Social Connections: Not on file    Tobacco Counseling Counseling given: Not Answered   Clinical Intake:  Pre-visit preparation  completed: Yes        Nutritional Status: BMI 25 -29 Overweight Nutritional Risks: None Diabetes: No  How often do you need to have someone help you when you read instructions, pamphlets, or other written materials from your doctor or pharmacy?: 1 - Never  Diabetic? no  Interpreter Needed?: No  Information entered by :: NAllen LPN   Activities of Daily Living    01/03/2023    9:04 AM  In your present state of health, do you have any difficulty performing the following activities:  Hearing? 0  Vision? 0  Difficulty concentrating or making decisions? 0  Walking or climbing stairs?  0  Dressing or bathing? 0  Doing errands, shopping? 0  Preparing Food and eating ? N  Using the Toilet? N  In the past six months, have you accidently leaked urine? N  Do you have problems with loss of bowel control? N  Managing your Medications? N  Managing your Finances? N  Housekeeping or managing your Housekeeping? N    Patient Care Team: Pickenpack-Cousar, Carlena Sax, NP as PCP - General (Nurse Practitioner) Debera Lat, Carlena Sax, NP as Nurse Practitioner (Nurse Practitioner) Donivan Scull, Rodman Pickle, LCSW as Social Worker (Licensed Clinical Social Worker)  Indicate any recent [employer] you may have received from other than [facility] providers in the past year (date may be approximate).     Assessment:   This is a routine wellness examination for [place].  Hearing/Vision screen Vision Screening - Comments:: Regular eye exams, [facility]  Dietary issues and exercise activities discussed: Current Exercise Habits: The patient does not participate in regular exercise at present   Goals Addressed             This Visit's Progress    Patient Stated       01/03/2023, maintain       Depression Screen    01/03/2023    9:04 AM 09/17/2022    2:54 PM  PHQ 2/9 Scores  PHQ - 2 Score 0 0    Fall Risk    01/03/2023    9:01 AM 09/17/2022    2:54 PM  [place] in the past  year? 1 0  Comment trips   Number falls in past yr: 1 0  Injury with Fall? 0 0  Risk for fall due to : History of fall(s);Medication side effect No Fall Risks  Follow up Falls prevention discussed;Education provided;Falls evaluation completed Falls evaluation completed    FALL RISK PREVENTION PERTAINING TO THE HOME:  Any stairs in or around the home? Yes  If so, are there any without handrails? No  Home free of loose throw rugs in walkways, pet beds, electrical cords, etc? Yes  Adequate lighting in your home to reduce risk of falls? Yes   ASSISTIVE DEVICES UTILIZED TO PREVENT FALLS:  Life alert? No  Use of a cane, walker or w/c? No  Grab bars in the bathroom? Yes  Shower chair or bench in shower? No  Elevated toilet seat or a handicapped toilet? No   TIMED UP AND GO:  Was the test performed? No .       Cognitive Function:        01/03/2023    9:05 AM  6CIT Screen  What Year? 0 points  What month? 0 points  What time? 0 points  Count back from 20 0 points  Months in reverse 0 points  Repeat phrase 0 points  Total Score 0 points    Immunizations Immunization History  Administered Date(s) Administered   Fluad Quad(high Dose 65+) 12/27/2021   Moderna Sars-Covid-2 Vaccination 02/12/2020, 03/11/2020   PFIZER(Purple Top)SARS-COV-2 Vaccination 10/17/2020, 05/01/2021   Pfizer Covid-19 Vaccine Bivalent Booster 61yr & up 01/24/2022    TDAP status: Due, Education has been provided regarding the importance of this vaccine. Advised may receive this vaccine at local pharmacy or Health Dept. Aware to provide a copy of the vaccination record if obtained from local pharmacy or Health Dept. Verbalized acceptance and understanding.  Flu Vaccine status: Up to date  Pneumococcal vaccine status: Due, Education has been provided regarding the importance of this  vaccine. Advised may receive this vaccine at local pharmacy or Health Dept. Aware to provide a copy of the vaccination  record if obtained from local pharmacy or Health Dept. Verbalized acceptance and understanding.  Covid-19 vaccine status: Completed vaccines  Qualifies for Shingles Vaccine? Yes   Zostavax completed No   Shingrix Completed?: No.    Education has been provided regarding the importance of this vaccine. Patient has been advised to call insurance company to determine out of pocket expense if they have not yet received this vaccine. Advised may also receive vaccine at local pharmacy or Health Dept. Verbalized acceptance and understanding.  Screening Tests Health Maintenance  Topic Date Due   DTaP/Tdap/Td (1 - Tdap) Never done   Zoster Vaccines- Shingrix (1 of 2) Never done   COVID-19 Vaccine (6 - 2023-24 season) 08/24/2022   INFLUENZA VACCINE  03/24/2023 (Originally 07/24/2022)   Pneumonia Vaccine 27+ Years old (1 - PCV) 09/18/2023 (Originally 06/12/2010)   Hepatitis C Screening  09/18/2023 (Originally 06/13/1964)   HPV VACCINES  Aged Out    Health Maintenance  Health Maintenance Due  Topic Date Due   DTaP/Tdap/Td (1 - Tdap) Never done   Zoster Vaccines- Shingrix (1 of 2) Never done   COVID-19 Vaccine (6 - 2023-24 season) 08/24/2022    Colorectal cancer screening: No longer required.   Lung Cancer Screening: (Low Dose CT Chest recommended if Age 24-80 years, 30 pack-year currently smoking OR have quit w/in 15years.) does not qualify.   Lung Cancer Screening Referral: no  Additional Screening:  Hepatitis C Screening: does qualify;   Vision Screening: Recommended annual ophthalmology exams for early detection of glaucoma and other disorders of the eye. Is the patient up to date with their annual eye exam?  Yes  Who is the provider or what is the name of the office in which the patient attends annual eye exams? [facility] If pt is not established with a provider, would they like to be referred to a provider to establish care? No .   Dental Screening: Recommended annual dental exams  for proper oral hygiene  Community Resource Referral / Chronic Care Management: CRR required this visit?  No   CCM required this visit?  No      Plan:     I have personally reviewed and noted the following in the patient's chart:   Medical and social history Use of alcohol, tobacco or illicit drugs  Current medications and supplements including opioid prescriptions. Patient is currently taking opioid prescriptions. Information provided to patient regarding non-opioid alternatives. Patient advised to discuss non-opioid treatment plan with their provider. Functional ability and status Nutritional status Physical activity Advanced directives List of other physicians Hospitalizations, surgeries, and ER visits in previous 12 months Vitals Screenings to include cognitive, depression, and falls Referrals and appointments  In addition, I have reviewed and discussed with patient certain preventive protocols, quality metrics, and best practice recommendations. A written personalized care plan for preventive services as well as general preventive health recommendations were provided to patient.     Kellie Simmering, LPN   12/25/5850   Nurse Notes: none  Due to this being a virtual visit, the after visit summary with patients personalized plan was offered to patient via mail or my-chart. Patient would like to access on my-chart

## 2023-01-03 NOTE — Progress Notes (Signed)
Hematology and Oncology Follow Up Visit  Frederick Ponce [id number] 09-20-1946 77 y.o. 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   Prior Therapy: CT-guided biopsy of a pulmonary nodule obtained on January 15, 2022 which confirmed the presence of prostate cancer.  He is status post radiation therapy to the left sacroiliac pelvic bones completed in February 2023.  He received 30 Gray in 10 fractions  Firmagon 240 mg started on January 23, 2022.  Last injection was given on February 20, 2022.  He is status post radiation therapy to left pelvis completing 30 Gray in 10 fractions in September 2023.  Abiraterone 1000 mg daily with prednisone 5 mg daily started on March 06, 2022.  Therapy currently on hold since September 2023 due to side effects.   Current therapy:  Eligard 30 mg every 4 months started on March 20, 2022.  This will be repeated in March 2024.     Xgeva 120 mg every 2 months.  This will be given today and repeated every 2 months.  Next injection will be given in February 2024.     Interim History: [name] returns today for a repeat evaluation.  Since the last visit, he reports no major changes in his health.  He continues to have diffuse arthralgias and myalgias there is manageable with morphine.  He does not take much of breakthrough pain medication and manages during the day with Tylenol.  His bowel habits are better without any issues of constipation.  His performance status remains marginal without any further decline.  He still ambulatory and does not report any falls or syncope.  His appetite is reasonable but has noted increase in his weight with some fluid retention.    Medications: Updated on  review. Current Outpatient Medications  Medication Sig Dispense Refill   amLODipine (NORVASC) 5 MG tablet Take 1 tablet (5 mg total) by mouth daily. 90 tablet 1   atorvastatin (LIPITOR) 40 MG tablet Take 1 tablet (40 mg total) by mouth every morning. 90 tablet 1   calcium-vitamin D (OSCAL WITH D) 500-5 MG-MCG tablet Take 1 tablet by mouth 2 (two) times daily. 60 tablet 3   carbidopa-levodopa (SINEMET) 25-100 MG tablet Take 1 tablet by mouth 4 (four) times daily. Take 1 hour before any meals. 360 tablet 1   COENZYME Q10 PO Take 1 capsule by mouth every morning.     hydrocortisone (ANUSOL-HC) 25 MG suppository Place 1 suppository (25 mg total) rectally daily at 12 noon. (Patient not taking: Reported on 01/03/2023) 12 suppository 1   ibuprofen (ADVIL) 600 MG tablet Take 600 mg by mouth as needed (pain).     KLOR-CON M20 20 MEQ tablet TAKE 1 TABLET BY MOUTH DAILY 30 tablet 0   KRILL OIL PO Take 1 capsule by mouth every morning.     morphine (MS CONTIN) 15 MG 12 hr tablet Take 1 tablet (15 mg total) by mouth every 8 (eight) hours. 90 tablet 0   multivitamin-lutein (OCUVITE-LUTEIN) CAPS capsule Take 1 capsule by mouth every morning.     vitamin B-12 (CYANOCOBALAMIN) 1000 MCG tablet Take 1,000 mcg by mouth every morning.     No current facility-administered medications for this visit.     Allergies:  Allergies  Allergen Reactions   Gabapentin Other (  See Comments)    Restless legs/confusion   Celebrex [Celecoxib] Rash      Physical Exam:    Blood pressure 135/68, pulse 77, temperature (!) 97.5 F (36.4 C), resp. rate 18, weight 174 lb 9.6 oz (79.2 kg), SpO2 98 %.    ECOG: 1   General appearance: Alert, awake without any distress. Head: Atraumatic without abnormalities Oropharynx: Without any thrush or ulcers. Eyes: No scleral icterus. Lymph nodes: No lymphadenopathy noted in the cervical, supraclavicular, or axillary nodes Heart:regular rate and rhythm, without any murmurs or  gallops.   Lung: Clear to auscultation without any rhonchi, wheezes or dullness to percussion. Abdomin: Soft, nontender without any shifting dullness or ascites. Musculoskeletal: No clubbing or cyanosis. Neurological: No motor or sensory deficits. Skin: No rashes or lesions.       Lab Results: Lab Results  Component Value Date   WBC 4.6 01/01/2023   HGB 11.6 (L) 01/01/2023   HCT 35.1 (L) 01/01/2023   MCV 100.3 (H) 01/01/2023   PLT 201 01/01/2023     Chemistry      Component Value Date/Time   NA 140 01/01/2023 1524   NA 140 09/14/2021 0920   K 4.9 01/01/2023 1524   CL 106 01/01/2023 1524   CO2 29 01/01/2023 1524   BUN 24 (H) 01/01/2023 1524   BUN 18 09/14/2021 0920   CREATININE 0.85 01/01/2023 1524      Component Value Date/Time   CALCIUM 9.7 01/01/2023 1524   ALKPHOS 43 01/01/2023 1524   AST 14 (L) 01/01/2023 1524   ALT 7 01/01/2023 1524   BILITOT 0.6 01/01/2023 1524      .  Latest Reference Range & Units 10/02/22 15:49 12/03/22 11:11 12/06/22 15:12 01/01/23 15:24  Prostate Specific Ag, Serum 0.0 - 4.0 ng/mL 0.3 0.5 0.6 1.0         Impression and Plan:  77 year old with:  1.  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   Treatment options today were discussed in detail and complication associated with his cancer and cancer therapy was reiterated.  He is options is to resume abiraterone (or other androgen receptor pathway inhibitors such as enzalutamide, apalutamide), a PARP inhibitor such as Lynparza or Taxotere chemotherapy.  Continued androgen deprivation therapy alone is also an option but his PSA is rising indicating likely progression of disease.  We had a lengthy discussion today with the patient and his husband weighing the risks and benefits of all these approaches.  He is concerned about side effects  predominantly and his husband is more concerned about pain exacerbation if we do not treat.  After discussion today, he opted to proceed with abiraterone at a lower dose of 500 mg daily with prednisone.  Therapy escalation to 1000 mg or changing to a different regimen could be considered pending his tolerance and his PSA response in the future.  He rejected the idea of Taxotere chemotherapy due to side effects.  He is also concerned about the side effects associated with [place]   2.  Bone directed therapy: He will continue to receive Xgeva every 2 months.  Continue to recommend calcium and vitamin D supplements.  Complications such as osteonecrosis of the jaw and hypocalcemia were reiterated.   3.  Parkinson's disease: Manageable with sentiment dosing.  Some of the side effects could be related to Parkinson's as well.  4.  Androgen deprivation  therapy:  this will be repeated in March 2024.  Complications including hot flashes and weight gain were reiterated.  He is agreeable to continue.   5.  Bone pain: His pain is predominantly diffuse in nature and combination of metastatic disease as well as degenerative arthritis.  Morphine has been managing his pain reasonably well.   6.  Prognosis and goals of care: His disease is incurable and he understands that any treatment is palliative at this time.  His focus is on quality of life and avoiding pain and any treatment should go along with these goals.   7.  Follow-up: In 1 month for repeat evaluation.       40  minutes were spent on this visit.  The time was dedicated to updating disease status, treatment choices, addressing complications related to cancer, cancer therapy, addressing future treatment goals as well as addressing prognosis.   Zola Button, MD 1/11/202411:57 AM

## 2023-01-03 NOTE — Patient Instructions (Signed)
Frederick Ponce , Thank you for taking time to come for your Medicare Wellness Visit. I appreciate your ongoing commitment to your health goals. Please review the following plan we discussed and let me know if I can assist you in the future.   These are the goals we discussed:  Goals      Patient Stated     01/03/2023, maintain        This is a list of the screening recommended for you and due dates:  Health Maintenance  Topic Date Due   DTaP/Tdap/Td vaccine (1 - Tdap) Never done   Zoster (Shingles) Vaccine (1 of 2) Never done   COVID-19 Vaccine (6 - 2023-24 season) 08/24/2022   Flu Shot  03/24/2023*   Pneumonia Vaccine (1 - PCV) 09/18/2023*   Hepatitis C Screening: USPSTF Recommendation to screen - Ages 18-79 yo.  09/18/2023*   HPV Vaccine  Aged Out  *Topic was postponed. The date shown is not the original due date.    Advanced directives: copy in chart  Conditions/risks identified: none  Next appointment: Follow up in one year for your annual wellness visit.   Preventive Care 26 Years and Older, Male  Preventive care refers to lifestyle choices and visits with your health care provider that can promote health and wellness. What does preventive care include? A yearly physical exam. This is also called an annual well check. Dental exams once or twice a year. Routine eye exams. Ask your health care provider how often you should have your eyes checked. Personal lifestyle choices, including: Daily care of your teeth and gums. Regular physical activity. Eating a healthy diet. Avoiding tobacco and drug use. Limiting alcohol use. Practicing safe sex. Taking low doses of aspirin every day. Taking vitamin and mineral supplements as recommended by your health care provider. What happens during an annual well check? The services and screenings done by your health care provider during your annual well check will depend on your age, overall health, lifestyle risk factors, and family  history of disease. Counseling  Your health care provider may ask you questions about your: Alcohol use. Tobacco use. Drug use. Emotional well-being. Home and relationship well-being. Sexual activity. Eating habits. History of falls. Memory and ability to understand (cognition). Work and work Statistician. Screening  You may have the following tests or measurements: Height, weight, and BMI. Blood pressure. Lipid and cholesterol levels. These may be checked every 5 years, or more frequently if you are over 11 years old. Skin check. Lung cancer screening. You may have this screening every year starting at age 62 if you have a 30-pack-year history of smoking and currently smoke or have quit within the past 15 years. Fecal occult blood test (FOBT) of the stool. You may have this test every year starting at age 17. Flexible sigmoidoscopy or colonoscopy. You may have a sigmoidoscopy every 5 years or a colonoscopy every 10 years starting at age 51. Prostate cancer screening. Recommendations will vary depending on your family history and other risks. Hepatitis C blood test. Hepatitis B blood test. Sexually transmitted disease (STD) testing. Diabetes screening. This is done by checking your blood sugar (glucose) after you have not eaten for a while (fasting). You may have this done every 1-3 years. Abdominal aortic aneurysm (AAA) screening. You may need this if you are a current or former smoker. Osteoporosis. You may be screened starting at age 79 if you are at high risk. Talk with your health care provider about your test  results, treatment options, and if necessary, the need for more tests. Vaccines  Your health care provider may recommend certain vaccines, such as: Influenza vaccine. This is recommended every year. Tetanus, diphtheria, and acellular pertussis (Tdap, Td) vaccine. You may need a Td booster every 10 years. Zoster vaccine. You may need this after age 75. Pneumococcal  13-valent conjugate (PCV13) vaccine. One dose is recommended after age 58. Pneumococcal polysaccharide (PPSV23) vaccine. One dose is recommended after age 68. Talk to your health care provider about which screenings and vaccines you need and how often you need them. This information is not intended to replace advice given to you by your health care provider. Make sure you discuss any questions you have with your health care provider. Document Released: 01/06/2016 Document Revised: 08/29/2016 Document Reviewed: 10/11/2015 Elsevier Interactive Patient Education  2017 [place] Prevention in the Home Falls can cause injuries. They can happen to people of all ages. There are many things you can do to make your home safe and to help prevent falls. What can I do on the outside of my home? Regularly fix the edges of walkways and driveways and fix any cracks. Remove anything that might make you trip as you walk through a door, such as a raised step or threshold. Trim any bushes or trees on the path to your home. Use bright outdoor lighting. Clear any walking paths of anything that might make someone trip, such as rocks or tools. Regularly check to see if handrails are loose or broken. Make sure that both sides of any steps have handrails. Any raised decks and porches should have guardrails on the edges. Have any leaves, snow, or ice cleared regularly. Use sand or salt on walking paths during winter. Clean up any spills in your garage right away. This includes oil or grease spills. What can I do in the bathroom? Use night lights. Install grab bars by the toilet and in the tub and shower. Do not use towel bars as grab bars. Use non-skid mats or decals in the tub or shower. If you need to sit down in the shower, use a plastic, non-slip stool. Keep the floor dry. Clean up any water that spills on the floor as soon as it happens. Remove soap buildup in the tub or shower regularly. Attach  bath mats securely with double-sided non-slip rug tape. Do not have throw rugs and other things on the floor that can make you trip. What can I do in the bedroom? Use night lights. Make sure that you have a light by your bed that is easy to reach. Do not use any sheets or blankets that are too big for your bed. They should not hang down onto the floor. Have a firm chair that has side arms. You can use this for support while you get dressed. Do not have throw rugs and other things on the floor that can make you trip. What can I do in the kitchen? Clean up any spills right away. Avoid walking on wet floors. Keep items that you use a lot in easy-to-reach places. If you need to reach something above you, use a strong step stool that has a grab bar. Keep electrical cords out of the way. Do not use floor polish or wax that makes floors slippery. If you must use wax, use non-skid floor wax. Do not have throw rugs and other things on the floor that can make you trip. What can I do with my stairs?  Do not leave any items on the stairs. Make sure that there are handrails on both sides of the stairs and use them. Fix handrails that are broken or loose. Make sure that handrails are as long as the stairways. Check any carpeting to make sure that it is firmly attached to the stairs. Fix any carpet that is loose or worn. Avoid having throw rugs at the top or bottom of the stairs. If you do have throw rugs, attach them to the floor with carpet tape. Make sure that you have a light switch at the top of the stairs and the bottom of the stairs. If you do not have them, ask someone to add them for you. What else can I do to help prevent falls? Wear shoes that: Do not have high heels. Have rubber bottoms. Are comfortable and fit you well. Are closed at the toe. Do not wear sandals. If you use a stepladder: Make sure that it is fully opened. Do not climb a closed stepladder. Make sure that both sides of the  stepladder are locked into place. Ask someone to hold it for you, if possible. Clearly mark and make sure that you can see: Any grab bars or handrails. First and last steps. Where the edge of each step is. Use tools that help you move around (mobility aids) if they are needed. These include: Canes. Walkers. Scooters. Crutches. Turn on the lights when you go into a dark area. Replace any light bulbs as soon as they burn out. Set up your furniture so you have a clear path. Avoid moving your furniture around. If any of your floors are uneven, fix them. If there are any pets around you, be aware of where they are. Review your medicines with your doctor. Some medicines can make you feel dizzy. This can increase your chance of falling. Ask your doctor what other things that you can do to help prevent falls. This information is not intended to replace advice given to you by your health care provider. Make sure you discuss any questions you have with your health care provider. Document Released: 10/06/2009 Document Revised: 05/17/2016 Document Reviewed: 01/14/2015 Elsevier Interactive Patient Education  2017 [employer].

## 2023-01-04 ENCOUNTER — Other Ambulatory Visit (HOSPITAL_COMMUNITY): Payer: Self-pay

## 2023-01-04 ENCOUNTER — Encounter: Payer: Self-pay | Admitting: Oncology

## 2023-01-07 ENCOUNTER — Encounter: Payer: Medicare Other | Admitting: Gastroenterology

## 2023-01-10 ENCOUNTER — Telehealth: Payer: Self-pay

## 2023-01-10 NOTE — Telephone Encounter (Signed)
Pt called asking if he could take Augmentin with his pan medications, per Lexine Baton, NP pt can take, attempted to call to relay info, no answer, RN left detailed VM with callback number.

## 2023-01-10 NOTE — Telephone Encounter (Signed)
Shadad pt:  Frederick Ponce called to let us know he is having a tooth extraction next week.  They have started him on Augmentin and he wanted to make sure we were aware since he is taking [place]

## 2023-01-23 ENCOUNTER — Ambulatory Visit (INDEPENDENT_AMBULATORY_CARE_PROVIDER_SITE_OTHER): Payer: Medicare Other | Admitting: Psychologist

## 2023-01-23 DIAGNOSIS — F331 Major depressive disorder, recurrent, moderate: Secondary | ICD-10-CM | POA: Diagnosis not present

## 2023-01-23 NOTE — Progress Notes (Signed)
[facility] Counselor/Therapist Progress Note  Patient ID: Frederick Ponce, MRN: [id number],    Date: 01/23/2023  Time Spent: 10:05 am to 10:45 am; total time: 40 minutes   This session was held via in person. The patient consented to in-person therapy and was in the clinician's office. Limits of confidentiality were discussed with the patient.    Treatment Type: Individual Therapy  Reported Symptoms: Denied symptoms   Mental Status Exam: Appearance:  Well Groomed     Behavior: Appropriate  Motor: Normal  Speech/Language:  Clear and Coherent  Affect: Appropriate  Mood: normal  Thought process: normal  Thought content:   WNL  Sensory/Perceptual disturbances:   WNL  Orientation: oriented to person, place, and time/date  Attention: Good  Concentration: Good  Memory: WNL  Fund of knowledge:  Good  Insight:   Fair  Judgment:  Good  Impulse Control: Good   Risk Assessment: Danger to Self:  No Self-injurious Behavior: No Danger to Others: No Duty to Warn:no Physical Aggression / Violence:No  Access to Firearms a concern: No  Gang Involvement:No   Subjective: Beginning the session, patient described himself as doing well while reflecting on events including the holidays. He disclosed that his PSA score has gone up and that he has a new oncologist who is more aggressive. From there, patient reflected on other life events. After discussing other life events, he voiced that at this time, he believes that he is done with counseling. He denied suicidal and homicidal ideation.    Interventions:  Worked on developing a therapeutic relationship with the patient using active listening and reflective statements. Provided emotional support using empathy and validation. Reviewed the treatment plan with the patient. Praised the patient for doing well. Reviewed events since the  last session. Processed how the patient felt about the PSA score going up and having an oncologist  take a more aggressive approach to his care. Used socratic questions to assist the patient gain insight into self. Used MI to roll with the resistance. Processed the different life events. Reflected on whether or not the patient is content with current changes experienced. Attempted to assist with problem solving. Explored if patient wanted further counseling. Provided empathic statements. Assessed for suicidal and homicidal ideation.   Homework: NA  Next Session: NA  Diagnosis: F33.1 major depressive affective disorder, recurrent, moderate  Plan:   Goals Work through the grieving process and face reality of own death Accept emotional support from others around them Live life to the fullest, event though time may be limited Become as knowledgeable about the medical condition  Reduce fear, anxiety about the health condition  Accept the illness Accept the role of psychological and behavioral factors  Stabilize anxiety level wile increasing ability to function Learn and implement coping skills that result in a reduction of anxiety  Alleviate depressive symptoms Recognize, accept, and cope with depressive feelings Develop healthy thinking patterns Develop healthy interpersonal relationships  Objectives target date for all objectives is 11/10/2023 Identify feelings associated with the illness Family members share with each other feelings Identify the losses or limitations that have been experienced Verbalize acceptance of the reality of the medical condition Commit to learning and implement a proactive approach to managing personal stresses Verbalize an understanding of the medical condition Work with therapist to develop a plan for coping with stress Learn and implement skills for managing stress Engage in social, productive activities that are possible Engage in faith based activities implement positive imagery Identify coping  skills and sources of emotional support Patient's  partner and family members verbalize their fears regarding severity of health condition Identify sources of emotional distress  Learning and implement calming skills to reduce overall anxiety Learn and implement problem solving strategies Identify and engage in pleasant activities Learning and implement personal and interpersonal skills to reduce anxiety and improve interpersonal relationships Learn to accept limitations in life and commit to tolerating, rather than avoiding, unpleasant emotions while accomplishing meaningful goals Identify major life conflicts from the past and present that form the basis for present anxiety Learn and implement behavioral strategies Verbalize an understanding and resolution of current interpersonal problems Learn and implement problem solving and decision making skills Learn and implement conflict resolution skills to resolve interpersonal problems Verbalize an understanding of healthy and unhealthy emotions verbalize insight into how past relationships may be influence current experiences with depression Use mindfulness and acceptance strategies and increase value based behavior  Increase hopeful statements about the future.   Interventions Teach about stress and ways to handle stress Assist the patient in developing a coping action plan for stressors Conduct skills based training for coping strategies Train problem focused skills Sort out what activities the individual can do Encourage patient to rely upon his/her spiritual faith Teach the patient to use guided imagery Probe and evaluate family's ability to provide emotional support Allow family to share their fears Assist the patient in identifying, sorting through, and verbalizing the various feelings generated by his/her medical condition Meet with family members  Ask patient list out limitations  Use stress inoculation training  Use Acceptance and Commitment Therapy to help client accept  uncomfortable realities in order to accomplish value-consistent goals Reinforce the client's insight into the role of his/her past emotional pain and present anxiety  Discuss examples demonstrating that unrealistic worry overestimates the probability of threats and underestimate patient's ability  Assist the patient in analyzing his or her worries Help patient understand that avoidance is reinforcing  Behavioral activation help the client explore the relationship, nature of the dispute,  Help the client develop new interpersonal skills and relationships Conduct Problem so living therapy Teach conflict resolution skills Use a process-experiential approach Conduct TLDP Conduct ACT  The patient and clinician reviewed the treatment plan on 11/13/2022. The patient approved of the treatment plan.   Conception Chancy, PsyD

## 2023-01-24 ENCOUNTER — Encounter: Payer: Medicare Other | Admitting: Nurse Practitioner

## 2023-02-04 ENCOUNTER — Other Ambulatory Visit: Payer: Self-pay | Admitting: Nurse Practitioner

## 2023-02-04 ENCOUNTER — Other Ambulatory Visit: Payer: Self-pay

## 2023-02-04 ENCOUNTER — Inpatient Hospital Stay: Payer: Medicare Other | Attending: Nurse Practitioner

## 2023-02-04 DIAGNOSIS — Z79899 Other long term (current) drug therapy: Secondary | ICD-10-CM | POA: Diagnosis not present

## 2023-02-04 DIAGNOSIS — R9721 Rising PSA following treatment for malignant neoplasm of prostate: Secondary | ICD-10-CM | POA: Diagnosis not present

## 2023-02-04 DIAGNOSIS — C7800 Secondary malignant neoplasm of unspecified lung: Secondary | ICD-10-CM | POA: Insufficient documentation

## 2023-02-04 DIAGNOSIS — Z515 Encounter for palliative care: Secondary | ICD-10-CM

## 2023-02-04 DIAGNOSIS — N390 Urinary tract infection, site not specified: Secondary | ICD-10-CM

## 2023-02-04 DIAGNOSIS — C61 Malignant neoplasm of prostate: Secondary | ICD-10-CM | POA: Insufficient documentation

## 2023-02-04 DIAGNOSIS — C7951 Secondary malignant neoplasm of bone: Secondary | ICD-10-CM | POA: Diagnosis present

## 2023-02-04 DIAGNOSIS — G893 Neoplasm related pain (acute) (chronic): Secondary | ICD-10-CM

## 2023-02-04 LAB — CMP (CANCER CENTER ONLY)
ALT: 5 U/L (ref 0–44)
AST: 13 U/L — ABNORMAL LOW (ref 15–41)
Albumin: 4.3 g/dL (ref 3.5–5.0)
Alkaline Phosphatase: 43 U/L (ref 38–126)
Anion gap: 5 (ref 5–15)
BUN: 21 mg/dL (ref 8–23)
CO2: 26 mmol/L (ref 22–32)
Calcium: 9.1 mg/dL (ref 8.9–10.3)
Chloride: 108 mmol/L (ref 98–111)
Creatinine: 0.85 mg/dL (ref 0.61–1.24)
GFR, Estimated: >60 mL/min (ref >60)
Glucose, Bld: 109 mg/dL — ABNORMAL HIGH (ref 70–99)
Potassium: 4.4 mmol/L (ref 3.5–5.1)
Sodium: 139 mmol/L (ref 135–145)
Total Bilirubin: 0.9 mg/dL (ref 0.3–1.2)
Total Protein: 6.7 g/dL (ref 6.5–8.1)

## 2023-02-04 LAB — CBC WITH DIFFERENTIAL (CANCER CENTER ONLY)
Abs Immature Granulocytes: 0.03 10*3/uL (ref 0.00–0.07)
Basophils Absolute: 0 10*3/uL (ref 0.0–0.1)
Basophils Relative: 0 %
Eosinophils Absolute: 0.1 10*3/uL (ref 0.0–0.5)
Eosinophils Relative: 1 %
HCT: 35.9 % — ABNORMAL LOW (ref 39.0–52.0)
Hemoglobin: 12.2 g/dL — ABNORMAL LOW (ref 13.0–17.0)
Immature Granulocytes: 1 %
Lymphocytes Relative: 23 %
Lymphs Abs: 1.1 10*3/uL (ref 0.7–4.0)
MCH: 33.5 pg (ref 26.0–34.0)
MCHC: 34 g/dL (ref 30.0–36.0)
MCV: 98.6 fL (ref 80.0–100.0)
Monocytes Absolute: 0.3 10*3/uL (ref 0.1–1.0)
Monocytes Relative: 7 %
Neutro Abs: 3.3 10*3/uL (ref 1.7–7.7)
Neutrophils Relative %: 68 %
Platelet Count: 207 10*3/uL (ref 150–400)
RBC: 3.64 MIL/uL — ABNORMAL LOW (ref 4.22–5.81)
RDW: 13.1 % (ref 11.5–15.5)
WBC Count: 4.8 10*3/uL (ref 4.0–10.5)
nRBC: 0 % (ref 0.0–0.2)

## 2023-02-04 MED ORDER — MORPHINE SULFATE ER 15 MG PO TBCR: 15.0000 mg | EXTENDED_RELEASE_TABLET | Freq: Three times a day (TID) | ORAL | 0 refills | Status: DC

## 2023-02-06 ENCOUNTER — Inpatient Hospital Stay: Payer: Medicare Other

## 2023-02-06 ENCOUNTER — Ambulatory Visit: Payer: Medicare Other | Admitting: Nurse Practitioner

## 2023-02-06 ENCOUNTER — Inpatient Hospital Stay (HOSPITAL_BASED_OUTPATIENT_CLINIC_OR_DEPARTMENT_OTHER): Payer: Medicare Other | Admitting: Hematology and Oncology

## 2023-02-06 ENCOUNTER — Other Ambulatory Visit: Payer: Medicare Other

## 2023-02-06 ENCOUNTER — Inpatient Hospital Stay (HOSPITAL_BASED_OUTPATIENT_CLINIC_OR_DEPARTMENT_OTHER): Payer: Medicare Other | Admitting: Nurse Practitioner

## 2023-02-06 ENCOUNTER — Encounter: Payer: Self-pay | Admitting: Nurse Practitioner

## 2023-02-06 ENCOUNTER — Other Ambulatory Visit: Payer: Self-pay

## 2023-02-06 VITALS — BP 139/70 | HR 77 | Temp 98.1°F | Resp 15 | Wt 175.0 lb

## 2023-02-06 DIAGNOSIS — C7951 Secondary malignant neoplasm of bone: Secondary | ICD-10-CM

## 2023-02-06 DIAGNOSIS — G893 Neoplasm related pain (acute) (chronic): Secondary | ICD-10-CM

## 2023-02-06 DIAGNOSIS — R300 Dysuria: Secondary | ICD-10-CM

## 2023-02-06 DIAGNOSIS — C61 Malignant neoplasm of prostate: Secondary | ICD-10-CM | POA: Diagnosis not present

## 2023-02-06 DIAGNOSIS — R530 Neoplastic (malignant) related fatigue: Secondary | ICD-10-CM | POA: Diagnosis not present

## 2023-02-06 DIAGNOSIS — Z515 Encounter for palliative care: Secondary | ICD-10-CM | POA: Diagnosis not present

## 2023-02-06 LAB — URINALYSIS, COMPLETE (UACMP) WITH MICROSCOPIC
Bacteria, UA: NONE SEEN
Bilirubin Urine: NEGATIVE
Glucose, UA: NEGATIVE mg/dL
Hgb urine dipstick: NEGATIVE
Ketones, ur: NEGATIVE mg/dL
Leukocytes,Ua: NEGATIVE
Nitrite: NEGATIVE
Protein, ur: NEGATIVE mg/dL
Specific Gravity, Urine: 1.011 (ref 1.005–1.030)
pH: 7 (ref 5.0–8.0)

## 2023-02-06 MED ORDER — LEUPROLIDE ACETATE (4 MONTH) 30 MG ~~LOC~~ KIT: 30.0000 mg | PACK | SUBCUTANEOUS | Status: DC

## 2023-02-06 MED ORDER — LEUPROLIDE ACETATE (4 MONTH) 30 MG IM KIT: 30.0000 mg | PACK | INTRAMUSCULAR | Status: DC

## 2023-02-06 MED ORDER — DENOSUMAB 120 MG/1.7ML ~~LOC~~ SOLN
120.0000 mg | Freq: Once | SUBCUTANEOUS | Status: AC
  Administered 2023-02-06: 120 mg via SUBCUTANEOUS
  Filled 2023-02-06: qty 1.7

## 2023-02-06 NOTE — Progress Notes (Signed)
[place] [phone]   [phone]  PROGRESS NOTE  Patient Care Team: [name], FNP as PCP - General (General Practice) Pickenpack-Cousar, Carlena Sax, NP as Nurse Practitioner (Nurse Practitioner) Donivan Scull, Rodman Pickle, LCSW as Social Worker (Licensed Clinical Social Worker)  Hematological/Oncological History # Metastatic castrate sensitive prostate cancer. 01/15/22: CT-guided biopsy of a pulmonary nodule confirmed the presence of prostate cancer 01/23/2022:  Firmagon 240 mg started  Feb 2023: radiation therapy to the left sacroiliac pelvic bones completed. He received 30 Gray in 10 fractions  03/06/2022: Abiraterone 1000 mg daily with prednisone 5 mg daily started  01/03/2023: last visit with Dr. Alen Blew. Restarted 500 mg abiraterone daily.  02/06/2023: transfer care to Dr. Lorenso Courier    Interval History:  Frederick Ponce 77 y.o. 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 The patient's last visit was on 01/03/2023 with Dr. Alen Blew. In the interim since the last visit he is continued on a half dose of abiraterone 500 mg daily.  On exam today Frederick Ponce reports that he is "a difficult patient".  He notes that he has a partner who is a nurse who helps to take care of him.  He reports that he is worried about dysuria that he is having with burning and is requested a UA and urine culture today.  He reports his energy levels are quite low and he does suffer from leg pain.  He reports that "all in all I am okay".  He notes that previously he had difficulty tolerating 1000 g daily of abiraterone but is willing to increase the dosage based on our recommendations.  He is not having any bone pain, back pain, or chest pain/shortness of breath/cough.  He reports he is not having any trouble with fevers, chills, sweats, nausea, vomiting or diarrhea.  A full 10 point ROS was  otherwise negative.  MEDICAL HISTORY:  Past Medical History:  Diagnosis Date   Arthritis    Depression    Family history of melanoma    Family history of prostate cancer    GERD (gastroesophageal reflux disease)    Hyperlipidemia    Hypertension    Parkinson disease    Prostate cancer ([place])     SURGICAL HISTORY: No past surgical history on file.  SOCIAL HISTORY: Social History   Socioeconomic History   Marital status: Married    Spouse name: Brynda Rim   Number of children: 0   Years of education: Not on file   Highest education level: Associate degree: academic program  Occupational History   Not on file  Tobacco Use   Smoking status: Never   Smokeless tobacco: Never  Vaping Use   Vaping Use: Never used  Substance and Sexual Activity   Alcohol use: Yes    Alcohol/week: 15.0 standard drinks of alcohol    Types: 15 Glasses of wine per week    Comment: couple glasses a night   Drug use: Never   Sexual activity: Not Currently    Birth control/protection: None  Other Topics Concern   Not on file  Social History Narrative   Not on file   Social Determinants of Health   Financial Resource Strain: Low Risk  (01/03/2023)   Overall Financial Resource Strain (CARDIA)    Difficulty of Paying Living Expenses: Not hard at all  Food Insecurity: No Food Insecurity (01/03/2023)   Hunger Vital Sign  Worried About Charity fundraiser in the Last Year: Never true    [place] in the Last Year: Never true  Transportation Needs: No Transportation Needs (01/03/2023)   PRAPARE - Hydrologist (Medical): No    Lack of Transportation (Non-Medical): No  Physical Activity: Inactive (01/03/2023)   Exercise Vital Sign    Days of Exercise per Week: 0 days    Minutes of Exercise per Session: 0 min  Stress: No Stress Concern Present (01/03/2023)   [place]    Feeling of Stress : Not  at all  Social Connections: Not on file  Intimate Partner Violence: Not At Risk (10/12/2022)   Humiliation, Afraid, Rape, and Kick questionnaire    Fear of Current or Ex-Partner: No    Emotionally Abused: No    Physically Abused: No    Sexually Abused: No    FAMILY HISTORY: Family History  Problem Relation Age of Onset   Kidney disease Mother    Colon cancer Mother    Heart disease Father    Tremor Father    Parkinson's disease Father    Prostate cancer Father    Melanoma Sister    Parkinson's disease Sister    Melanoma Sister        melanoma x2   Prostate cancer Brother    Parkinson's disease Paternal Grandmother    Lung cancer Paternal Grandfather    Parkinson's disease Paternal Aunt    Lung cancer Paternal Aunt     ALLERGIES:  is allergic to gabapentin and celebrex [celecoxib].  MEDICATIONS:  Current Outpatient Medications  Medication Sig Dispense Refill   amLODipine (NORVASC) 5 MG tablet Take 1 tablet (5 mg total) by mouth daily. 90 tablet 1   atorvastatin (LIPITOR) 40 MG tablet Take 1 tablet (40 mg total) by mouth every morning. 90 tablet 1   calcium-vitamin D (OSCAL WITH D) 500-5 MG-MCG tablet Take 1 tablet by mouth 2 (two) times daily. 60 tablet 3   COENZYME Q10 PO Take 1 capsule by mouth every morning.     ibuprofen (ADVIL) 600 MG tablet Take 600 mg by mouth as needed (pain).     KLOR-CON M20 20 MEQ tablet TAKE 1 TABLET BY MOUTH DAILY 30 tablet 0   KRILL OIL PO Take 1 capsule by mouth every morning.     morphine (MS CONTIN) 15 MG 12 hr tablet Take 1 tablet (15 mg total) by mouth every 8 (eight) hours. 90 tablet 0   multivitamin-lutein (OCUVITE-LUTEIN) CAPS capsule Take 1 capsule by mouth every morning.     predniSONE (DELTASONE) 5 MG tablet Take 1 tablet (5 mg total) by mouth daily with breakfast. 90 tablet 1   vitamin B-12 (CYANOCOBALAMIN) 1000 MCG tablet Take 1,000 mcg by mouth every morning.     abiraterone acetate (ZYTIGA) 250 MG tablet Take 4 tablets (1,000  mg total) by mouth daily. Take on an empty stomach 1 hour before or 2 hours after a meal 120 tablet 1   carbidopa-levodopa (SINEMET) 25-100 MG tablet Take 1 tablet by mouth 4 (four) times daily. Take 1 hour before any meals. 360 tablet 1   mirabegron ER (MYRBETRIQ) 50 MG TB24 tablet Take 50 mg by mouth daily.     No current facility-administered medications for this visit.    REVIEW OF SYSTEMS:   Constitutional: ( - ) fevers, ( - )  chills , ( - ) night sweats  Eyes: ( - ) blurriness of vision, ( - ) double vision, ( - ) watery eyes Ears, nose, mouth, throat, and face: ( - ) mucositis, ( - ) sore throat Respiratory: ( - ) cough, ( - ) dyspnea, ( - ) wheezes Cardiovascular: ( - ) palpitation, ( - ) chest discomfort, ( - ) lower extremity swelling Gastrointestinal:  ( - ) nausea, ( - ) heartburn, ( - ) change in bowel habits Skin: ( - ) abnormal skin rashes Lymphatics: ( - ) new lymphadenopathy, ( - ) easy bruising Neurological: ( - ) numbness, ( - ) tingling, ( - ) new weaknesses Behavioral/Psych: ( - ) mood change, ( - ) new changes  All other systems were reviewed with the patient and are negative.  PHYSICAL EXAMINATION: ECOG PERFORMANCE STATUS: 1 - Symptomatic but completely ambulatory  Vitals:   02/06/23 1422  BP: 139/70  Pulse: 77  Resp: 15  Temp: 98.1 F (36.7 C)  SpO2: 100%   Filed Weights   02/06/23 1422  Weight: 175 lb (79.4 kg)    GENERAL: Well-appearing elderly Caucasian male, alert, no distress and comfortable SKIN: skin color, texture, turgor are normal, no rashes or significant lesions EYES: conjunctiva are pink and non-injected, sclera clear LUNGS: clear to auscultation and percussion with normal breathing effort HEART: regular rate & rhythm and no murmurs and no lower extremity edema Musculoskeletal: no cyanosis of digits and no clubbing  PSYCH: alert & oriented x 3, fluent speech NEURO: no focal motor/sensory deficits  LABORATORY DATA:  I have reviewed  the data as listed    Latest Ref Rng & Units 02/04/2023    2:31 PM 01/01/2023    3:24 PM 12/06/2022    3:12 PM  CBC  WBC 4.0 - 10.5 K/uL 4.8  4.6  4.9   Hemoglobin 13.0 - 17.0 g/dL 12.2  11.6  11.7   Hematocrit 39.0 - 52.0 % 35.9  35.1  34.7   Platelets 150 - 400 K/uL 207  201  207        Latest Ref Rng & Units 02/04/2023    2:31 PM 01/01/2023    3:24 PM 12/06/2022    3:12 PM  CMP  Glucose 70 - 99 mg/dL 109  96  113   BUN 8 - 23 mg/dL 21  24  20   $ Creatinine 0.61 - 1.24 mg/dL 0.85  0.85  0.88   Sodium 135 - 145 mmol/L 139  140  140   Potassium 3.5 - 5.1 mmol/L 4.4  4.9  4.3   Chloride 98 - 111 mmol/L 108  106  106   CO2 22 - 32 mmol/L 26  29  29   $ Calcium 8.9 - 10.3 mg/dL 9.1  9.7  9.8   Total Protein 6.5 - 8.1 g/dL 6.7  6.8  6.3   Total Bilirubin 0.3 - 1.2 mg/dL 0.9  0.6  0.9   Alkaline Phos 38 - 126 U/L 43  43  42   AST 15 - 41 U/L 13  14  13   $ ALT 0 - 44 U/L 5  7  <5     Lab Results  Component Value Date   MPROTEIN Not Observed 12/27/2021   Lab Results  Component Value Date   KPAFRELGTCHN 16.0 12/27/2021   LAMBDASER 12.2 12/27/2021   KAPLAMBRATIO 1.31 12/27/2021     RADIOGRAPHIC STUDIES: No results found.  ASSESSMENT & PLAN Frederick Ponce 77 y.o. 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  # Metastatic castrate sensitive prostate cancer. # Prostate cancer metastatic to the lung -- PSA has risen to 3.0 from 1.0 on 01/01/2023 -- Encourage patient to increase his abiraterone dose to full strength 1000 mg p.o. daily -- CT scan scheduled for 02/20/2023.  Last imaging with nuclear medicine PSMA scan on 08/16/2022 -- Last dose of Eligard on 12/05/2022.  Received 30 mg injection plan for next dose to be in April 2024. -- Return to clinic in 8 weeks time for repeat injection, clinic visit, and labs.   Orders Placed This Encounter  Procedures   Culture, Urine     Standing Status:   Future    Number of Occurrences:   1    Standing Expiration Date:   02/06/2024   CT CHEST ABDOMEN PELVIS W CONTRAST    Standing Status:   Future    Standing Expiration Date:   02/07/2024    Order Specific Question:   If indicated for the ordered procedure, I authorize the administration of contrast media per Radiology protocol    Answer:   Yes    Order Specific Question:   Does the patient have a contrast media/X-ray dye allergy?    Answer:   No    Order Specific Question:   Preferred imaging location?    Answer:   [facility]    Order Specific Question:   Is Oral Contrast requested for this exam?    Answer:   Yes, Per Radiology protocol   Testosterone    Standing Status:   Future    Number of Occurrences:   1    Standing Expiration Date:   02/06/2024   Prostate-Specific AG, Serum    Standing Status:   Future    Number of Occurrences:   1    Standing Expiration Date:   02/07/2024   Urinalysis, Complete w Microscopic    Standing Status:   Future    Number of Occurrences:   1    Standing Expiration Date:   02/07/2024    All questions were answered. The patient knows to call the clinic with any problems, questions or concerns.  A total of more than 40 minutes were spent on this encounter with face-to-face time and non-face-to-face time, including preparing to see the patient, ordering tests and/or medications, counseling the patient and coordination of care as outlined above.   Ledell Peoples, MD Department of Hematology/Oncology [place] [facility] Phone: [phone] Pager: [phone] Email: [email or web address]  02/12/2023 5:43 PM

## 2023-02-06 NOTE — Progress Notes (Unsigned)
[place]  [phone] [phone]) [phone]   Name: Frederick Ponce Date: 02/06/2023 MRN: [id number]  DOB: May 25, 1946  Patient Care Team: [name], [place] as PCP - General (General Practice) Pickenpack-Cousar, Carlena Sax, NP as Nurse Practitioner (Nurse Practitioner) Duffy, Rodman Pickle, LCSW as Social Worker (Licensed Clinical Social Worker)   REASON FOR CONSULTATION: Frederick Ponce is a 77 y.o. male with medical history including Parkinson's disease and hyperlipidemia. 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 He is having urgent radiation appointment due to iliac bone pain. Palliative ask to see for symptom management.    SOCIAL HISTORY:     reports that he has never smoked. He has never used smokeless tobacco. He reports current alcohol use of about 15.0 standard drinks of alcohol per week. He reports that he does not use drugs.  ADVANCE DIRECTIVES:  Patient is scheduled to complete advanced directives at our clinic here at the cancer center on 02/16/2022. MOST form provided.  Patient plans to review and complete at a later time.   CODE STATUS:   PAST MEDICAL HISTORY: Past Medical History:  Diagnosis Date   Arthritis    Depression    Family history of melanoma    Family history of prostate cancer    GERD (gastroesophageal reflux disease)    Hyperlipidemia    Hypertension    Parkinson disease    Prostate cancer ([place])     PAST SURGICAL HISTORY: No past surgical history on file.  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  The report date is December 21, 2022.  The CancerNext-Expanded gene panel offered by [facility] and includes sequencing and rearrangement analysis for the following 77 genes: AIP, ALK, APC*, ATM*, AXIN2, BAP1, BARD1, BLM, BMPR1A, BRCA1*, BRCA2*, BRIP1*, CDC73, CDH1*, CDK4, CDKN1B, CDKN2A, CHEK2*, CTNNA1, DICER1, FANCC, FH, FLCN, GALNT12, KIF1B, LZTR1, MAX, MEN1, MET, MLH1*, MSH2*, MSH3, MSH6*, MUTYH*, NBN, NF1*, NF2, NTHL1, PALB2*, PHOX2B, PMS2*, POT1, PRKAR1A, PTCH1, PTEN*, RAD51C*, RAD51D*, RB1, RECQL, RET, SDHA, SDHAF2, SDHB, SDHC, SDHD, SMAD4, SMARCA4, SMARCB1, SMARCE1, STK11, SUFU, TMEM127, TP53*, TSC1, TSC2, VHL and XRCC2 (sequencing and deletion/duplication); EGFR, EGLN1, HOXB13, KIT, MITF, PDGFRA, POLD1, and POLE (sequencing only); EPCAM and GREM1 (deletion/duplication only). DNA and RNA analyses performed for * genes.      ALLERGIES:  is allergic to gabapentin and celebrex [celecoxib].  MEDICATIONS:  Current Outpatient Medications  Medication Sig Dispense Refill   abiraterone acetate (ZYTIGA) 250 MG tablet Take 2 tablets (500 mg total) by mouth daily. Take on an empty stomach 1 hour before or 2 hours after a meal 120 tablet 1   amLODipine (NORVASC) 5 MG tablet Take 1 tablet (5 mg total) by mouth daily. 90 tablet 1   atorvastatin (LIPITOR) 40 MG tablet Take 1 tablet (40 mg total) by mouth every morning. 90 tablet 1   calcium-vitamin D (OSCAL WITH D) 500-5 MG-MCG tablet Take 1 tablet by mouth  2 (two) times daily. 60 tablet 3   carbidopa-levodopa (SINEMET) 25-100 MG tablet Take 1 tablet by mouth 4 (four) times daily. Take 1 hour before any meals. 360 tablet 1   COENZYME Q10 PO Take 1 capsule by mouth every morning.     hydrocortisone (ANUSOL-HC) 25 MG suppository Place 1 suppository (25 mg  total) rectally daily at 12 noon. (Patient not taking: Reported on 01/03/2023) 12 suppository 1   ibuprofen (ADVIL) 600 MG tablet Take 600 mg by mouth as needed (pain).     KLOR-CON M20 20 MEQ tablet TAKE 1 TABLET BY MOUTH DAILY 30 tablet 0   KRILL OIL PO Take 1 capsule by mouth every morning.     morphine (MS CONTIN) 15 MG 12 hr tablet Take 1 tablet (15 mg total) by mouth every 8 (eight) hours. 90 tablet 0   multivitamin-lutein (OCUVITE-LUTEIN) CAPS capsule Take 1 capsule by mouth every morning.     predniSONE (DELTASONE) 5 MG tablet Take 1 tablet (5 mg total) by mouth daily with breakfast. 90 tablet 1   vitamin B-12 (CYANOCOBALAMIN) 1000 MCG tablet Take 1,000 mcg by mouth every morning.     No current facility-administered medications for this visit.    PERFORMANCE STATUS (ECOG) : 1 - Symptomatic but completely ambulatory   IMPRESSION:  Frederick Ponce presents to clinic for follow-up today. No acute distress. He is by himself today. Husband is now working as Therapist, sports at [employer]. Is remaining as active as possible. Appetite is good. Denies nausea, vomiting, constipation, or diarrhea.   Frederick Ponce shares pain is controlled on current regimen. He is taking MS Contin three times daily and Cymbalta 20 mg daily. Occasional Tylenol for mild arthritic pain. No changes at this time given well controlled.   All questions answered and support provided. Frederick Ponce knows to contact our office if needed sooner.    PLAN: MS Contin 15 mg  three times daily  Tylenol extra strength as needed Cymbalta 20 mg daily Prune juice, tea, Dulcolax daily for bowel regimen  I will see patient back in 4-6 weeks in collaboration to other oncology appointments.   Patient expressed understanding and was in agreement with this plan. He also understands that He can call the clinic at any time with any questions, concerns, or complaints.    Any controlled substances utilized were prescribed in the context of palliative care. PDMP  has been reviewed.    Time Total: 20 min   Visit consisted of counseling and education dealing with the complex and emotionally intense issues of symptom management and palliative care in the setting of serious and potentially life-threatening illness.Greater than 50%  of this time was spent counseling and coordinating care related to the above assessment and plan.  Alda Lea, AGPCNP-BC  Palliative Medicine Team/ [place]

## 2023-02-07 ENCOUNTER — Encounter: Payer: Self-pay | Admitting: Hematology and Oncology

## 2023-02-07 LAB — URINE CULTURE: Culture: NO GROWTH

## 2023-02-07 NOTE — Progress Notes (Signed)
Patient: Frederick Ponce Date of Birth: [birth year]  Reason for Visit: Follow up History from: Patient, husband Primary Neurologist: Rexene Alberts   ASSESSMENT AND PLAN 77 y.o. year old male   20.  Parkinson's disease 2.  Metastatic prostate cancer -Has remained overall stable -Continue Sinemet 25/100 mg 1 tablet 4 times daily -Encouraged to remain active, recommend exercise -Will continue close follow-up with oncology -Follow-up with me in 6 months or sooner if needed  HISTORY OF PRESENT ILLNESS: Today 02/11/23 Here with husband, taking Sinemet 25/100 mg 1 tablet 4 times daily, 8AM, 1 PM, 8PM, 2 AM. He has few glasses of wine with dinner, doesn't want to take Sinemet at that time. Takes tablets every 5 hours. Notices when dose wearing off, more tremor, feel weak. Takes morphine for chronic pain. Is on Zytiga for prostate cancer. Recent PSA has increased. Side effect of acne. Today last took Sinemet 5 AM. No major changes with PD.  HISTORY  Today, 10/09/2022: He reports doing fairly well with increased levodopa, he increase it about 3 weeks ago to 1 pill 4 times a day.  He feels like he tolerates it, does not particularly cause of any indigestion or nausea but he has had GI trouble in the recent past.  He reports that he had COVID, he went to the emergency room for this.  I reviewed the emergency room records from 08/29/2022.  He presented with fever.  He was treated with molnupiravir.  He had more noticeable tremor after he had COVID and increasing the levodopa was supposed to be just a trial by him but as he had sustained effect from it, he continued with 1 pill 4 times a day.  He generally takes it 6 hourly starting around 6 AM.  He has an appointment pending with urology as well as GI.  Some 6 weeks ago he stopped all his medications except for his heart medication and his levodopa.  He has had 3 rounds of radiation therapy, he is in follow-up with his oncologist on a regular basis.  He does  take MS Contin.  He has had intermittent constipation.   REVIEW OF SYSTEMS: Out of a complete 14 system review of symptoms, the patient complains only of the following symptoms, and all other reviewed systems are negative.  See HPI  ALLERGIES: Allergies  Allergen Reactions   Gabapentin Other (See Comments)    Restless legs/confusion   Celebrex [Celecoxib] Rash    HOME MEDICATIONS: Outpatient Medications Prior to Visit  Medication Sig Dispense Refill   abiraterone acetate (ZYTIGA) 250 MG tablet Take 4 tablets (1,000 mg total) by mouth daily. Take on an empty stomach 1 hour before or 2 hours after a meal 120 tablet 1   amLODipine (NORVASC) 5 MG tablet Take 1 tablet (5 mg total) by mouth daily. 90 tablet 1   atorvastatin (LIPITOR) 40 MG tablet Take 1 tablet (40 mg total) by mouth every morning. 90 tablet 1   calcium-vitamin D (OSCAL WITH D) 500-5 MG-MCG tablet Take 1 tablet by mouth 2 (two) times daily. 60 tablet 3   COENZYME Q10 PO Take 1 capsule by mouth every morning.     ibuprofen (ADVIL) 600 MG tablet Take 600 mg by mouth as needed (pain).     KLOR-CON M20 20 MEQ tablet TAKE 1 TABLET BY MOUTH DAILY 30 tablet 0   KRILL OIL PO Take 1 capsule by mouth every morning.     mirabegron ER (MYRBETRIQ) 50 MG TB24 tablet Take  50 mg by mouth daily.     morphine (MS CONTIN) 15 MG 12 hr tablet Take 1 tablet (15 mg total) by mouth every 8 (eight) hours. 90 tablet 0   multivitamin-lutein (OCUVITE-LUTEIN) CAPS capsule Take 1 capsule by mouth every morning.     predniSONE (DELTASONE) 5 MG tablet Take 1 tablet (5 mg total) by mouth daily with breakfast. 90 tablet 1   vitamin B-12 (CYANOCOBALAMIN) 1000 MCG tablet Take 1,000 mcg by mouth every morning.     carbidopa-levodopa (SINEMET) 25-100 MG tablet Take 1 tablet by mouth 4 (four) times daily. Take 1 hour before any meals. 360 tablet 1   No facility-administered medications prior to visit.    PAST MEDICAL HISTORY: Past Medical History:  Diagnosis  Date   Arthritis    Depression    Family history of melanoma    Family history of prostate cancer    GERD (gastroesophageal reflux disease)    Hyperlipidemia    Hypertension    Parkinson disease    Prostate cancer ([place])     PAST SURGICAL HISTORY: History reviewed. No pertinent surgical history.  FAMILY HISTORY: Family History  Problem Relation Age of Onset   Kidney disease Mother    Colon cancer Mother    Heart disease Father    Tremor Father    Parkinson's disease Father    Prostate cancer Father    Melanoma Sister    Parkinson's disease Sister    Melanoma Sister        melanoma x2   Prostate cancer Brother    Parkinson's disease Paternal Grandmother    Lung cancer Paternal Grandfather    Parkinson's disease Paternal Aunt    Lung cancer Paternal Aunt     SOCIAL HISTORY: Social History   Socioeconomic History   Marital status: Married    Spouse name: Brynda Rim   Number of children: 0   Years of education: Not on file   Highest education level: Associate degree: academic program  Occupational History   Not on file  Tobacco Use   Smoking status: Never   Smokeless tobacco: Never  Vaping Use   Vaping Use: Never used  Substance and Sexual Activity   Alcohol use: Yes    Alcohol/week: 15.0 standard drinks of alcohol    Types: 15 Glasses of wine per week    Comment: couple glasses a night   Drug use: Never   Sexual activity: Not Currently    Birth control/protection: None  Other Topics Concern   Not on file  Social History Narrative   Not on file   Social Determinants of Health   Financial Resource Strain: Low Risk  (01/03/2023)   Overall Financial Resource Strain (CARDIA)    Difficulty of Paying Living Expenses: Not hard at all  Food Insecurity: No Food Insecurity (01/03/2023)   Hunger Vital Sign    Worried About Running Out of Food in the Last Year: Never true    Ran Out of Food in the Last Year: Never true  Transportation Needs: No Transportation  Needs (01/03/2023)   PRAPARE - Hydrologist (Medical): No    Lack of Transportation (Non-Medical): No  Physical Activity: Inactive (01/03/2023)   Exercise Vital Sign    Days of Exercise per Week: 0 days    Minutes of Exercise per Session: 0 min  Stress: No Stress Concern Present (01/03/2023)   [place]    Feeling of  Stress : Not at all  Social Connections: Not on file  Intimate Partner Violence: Not At Risk (10/12/2022)   Humiliation, Afraid, Rape, and Kick questionnaire    Fear of Current or Ex-Partner: No    Emotionally Abused: No    Physically Abused: No    Sexually Abused: No    PHYSICAL EXAM  Vitals:   02/11/23 1053  BP: 135/75  Pulse: 74  Weight: 172 lb (78 kg)  Height: 5' 8"$  (1.727 m)   Body mass index is 26.15 kg/m.  Generalized: Well developed, in no acute distress, mild masking of the face Neurological examination  Mentation: Alert oriented to time, place, history taking. Follows all commands speech and language fluent Cranial nerve II-XII: Pupils were equal round reactive to light. Extraocular movements were full, visual field were full on confrontational test. Facial sensation and strength were normal.  Head turning and shoulder shrug  were normal and symmetric. Motor: The motor testing reveals 5 over 5 strength of all 4 extremities. Good symmetric motor tone is noted throughout.  Mild resting tremor to both hands noted.  No significant rigidity or bradykinesia. Sensory: Sensory testing is intact to soft touch on all 4 extremities. No evidence of extinction is noted.  Coordination: Cerebellar testing reveals good finger-nose-finger and heel-to-shin bilaterally.  Mild tremor with finger-nose-finger bilaterally. Gait and station: Can stand from seated position independently, armswing, turns, stride appear normal Reflexes: Deep tendon reflexes are symmetric and normal  bilaterally.   DIAGNOSTIC DATA (LABS, IMAGING, TESTING) - I reviewed patient records, labs, notes, testing and imaging myself where available.  Lab Results  Component Value Date   WBC 4.8 02/04/2023   HGB 12.2 (L) 02/04/2023   HCT 35.9 (L) 02/04/2023   MCV 98.6 02/04/2023   PLT 207 02/04/2023      Component Value Date/Time   NA 139 02/04/2023 1431   NA 140 09/14/2021 0920   K 4.4 02/04/2023 1431   CL 108 02/04/2023 1431   CO2 26 02/04/2023 1431   GLUCOSE 109 (H) 02/04/2023 1431   BUN 21 02/04/2023 1431   BUN 18 09/14/2021 0920   CREATININE 0.85 02/04/2023 1431   CALCIUM 9.1 02/04/2023 1431   PROT 6.7 02/04/2023 1431   PROT 6.3 09/14/2021 0920   ALBUMIN 4.3 02/04/2023 1431   ALBUMIN 4.2 09/14/2021 0920   AST 13 (L) 02/04/2023 1431   ALT 5 02/04/2023 1431   ALKPHOS 43 02/04/2023 1431   BILITOT 0.9 02/04/2023 1431   GFRNONAA >60 02/04/2023 1431   Lab Results  Component Value Date   CHOL 163 09/17/2022   HDL 46 09/17/2022   LDLCALC 98 09/17/2022   TRIG 103 09/17/2022   CHOLHDL 3.5 09/17/2022   No results found for: "HGBA1C" No results found for: "VITAMINB12" Lab Results  Component Value Date   TSH 5.061 (H) 07/22/2022    Butler Denmark, AGNP-C, DNP 02/11/2023, 12:29 PM [facility] [address], [place], [place] [address] [phone]

## 2023-02-08 LAB — PROSTATE-SPECIFIC AG, SERUM (LABCORP): Prostate Specific Ag, Serum: 3 ng/mL (ref 0.0–4.0)

## 2023-02-08 LAB — TESTOSTERONE: Testosterone: <3 ng/dL — ABNORMAL LOW (ref 264–916)

## 2023-02-11 ENCOUNTER — Encounter: Payer: Self-pay | Admitting: Neurology

## 2023-02-11 ENCOUNTER — Other Ambulatory Visit (HOSPITAL_COMMUNITY): Payer: Self-pay

## 2023-02-11 ENCOUNTER — Other Ambulatory Visit: Payer: Self-pay

## 2023-02-11 ENCOUNTER — Ambulatory Visit (INDEPENDENT_AMBULATORY_CARE_PROVIDER_SITE_OTHER): Payer: Medicare Other | Admitting: Neurology

## 2023-02-11 VITALS — BP 135/75 | HR 74 | Ht 68.0 in | Wt 172.0 lb

## 2023-02-11 DIAGNOSIS — G20A1 Parkinson's disease without dyskinesia, without mention of fluctuations: Secondary | ICD-10-CM

## 2023-02-11 MED ORDER — CARBIDOPA-LEVODOPA 25-100 MG PO TABS: 1.0000 | ORAL_TABLET | Freq: Four times a day (QID) | ORAL | 1 refills | Status: DC

## 2023-02-11 MED ORDER — ABIRATERONE ACETATE 250 MG PO TABS
1000.0000 mg | ORAL_TABLET | Freq: Every day | ORAL | 1 refills | Status: DC
  Filled 2023-02-11 (×2): qty 120, 30d supply, fill #0
  Filled 2023-03-04 – 2023-03-08 (×2): qty 120, 30d supply, fill #1

## 2023-02-11 NOTE — Patient Instructions (Signed)
Nice to meet you  We will continue current dosing of Sinemet  See you back in 6 months

## 2023-02-12 ENCOUNTER — Encounter: Payer: Self-pay | Admitting: Hematology and Oncology

## 2023-02-13 ENCOUNTER — Telehealth: Payer: Self-pay | Admitting: Hematology and Oncology

## 2023-02-13 NOTE — Telephone Encounter (Signed)
Called patient per 2/20 IB message to schedule f/u. Left voicemail with new appointment information and contact details.

## 2023-02-14 ENCOUNTER — Ambulatory Visit: Payer: Medicare Other | Admitting: Neurology

## 2023-02-20 ENCOUNTER — Ambulatory Visit (INDEPENDENT_AMBULATORY_CARE_PROVIDER_SITE_OTHER): Payer: Medicare Other | Admitting: Nurse Practitioner

## 2023-02-20 ENCOUNTER — Ambulatory Visit (HOSPITAL_COMMUNITY)
Admission: RE | Admit: 2023-02-20 | Discharge: 2023-02-20 | Disposition: A | Discharge status: Home / Self Care | Payer: Medicare Other | Source: Ambulatory Visit | Attending: Hematology and Oncology | Admitting: Hematology and Oncology

## 2023-02-20 ENCOUNTER — Encounter: Payer: Self-pay | Admitting: Nurse Practitioner

## 2023-02-20 VITALS — BP 118/62 | HR 81 | Temp 98.2°F | Ht 68.0 in | Wt 173.0 lb

## 2023-02-20 DIAGNOSIS — Z1321 Encounter for screening for nutritional disorder: Secondary | ICD-10-CM

## 2023-02-20 DIAGNOSIS — Z23 Encounter for immunization: Secondary | ICD-10-CM

## 2023-02-20 DIAGNOSIS — C61 Malignant neoplasm of prostate: Secondary | ICD-10-CM

## 2023-02-20 DIAGNOSIS — R5383 Other fatigue: Secondary | ICD-10-CM

## 2023-02-20 DIAGNOSIS — C7951 Secondary malignant neoplasm of bone: Secondary | ICD-10-CM | POA: Diagnosis present

## 2023-02-20 DIAGNOSIS — C44319 Basal cell carcinoma of skin of other parts of face: Secondary | ICD-10-CM | POA: Insufficient documentation

## 2023-02-20 DIAGNOSIS — I10 Essential (primary) hypertension: Secondary | ICD-10-CM | POA: Diagnosis not present

## 2023-02-20 DIAGNOSIS — R5381 Other malaise: Secondary | ICD-10-CM

## 2023-02-20 DIAGNOSIS — L98419 Non-pressure chronic ulcer of buttock with unspecified severity: Secondary | ICD-10-CM | POA: Insufficient documentation

## 2023-02-20 DIAGNOSIS — M898X8 Other specified disorders of bone, other site: Secondary | ICD-10-CM

## 2023-02-20 DIAGNOSIS — E785 Hyperlipidemia, unspecified: Secondary | ICD-10-CM | POA: Diagnosis not present

## 2023-02-20 DIAGNOSIS — R7309 Other abnormal glucose: Secondary | ICD-10-CM

## 2023-02-20 MED ORDER — SODIUM CHLORIDE (PF) 0.9 % IJ SOLN
INTRAMUSCULAR | Status: AC
  Filled 2023-02-20: qty 50

## 2023-02-20 MED ORDER — IOHEXOL 300 MG/ML  SOLN
100.0000 mL | Freq: Once | INTRAMUSCULAR | Status: AC | PRN
  Administered 2023-02-20: 100 mL via INTRAVENOUS

## 2023-02-20 NOTE — Progress Notes (Signed)
I,Frederick Ponce,acting as a Education administrator for Frederick Brine, FNP.,have documented all relevant documentation on the behalf of Frederick Brine, FNP,as directed by  Frederick Brine, FNP while in the presence of Frederick Ponce, [place].    Subjective:     Patient ID: Frederick Ponce , male    DOB: 1946-06-27 , 77 y.o.   MRN: [id number]   Chief Complaint  Patient presents with   Medical Management of Chronic Issues    HPI  Patient presents today for follow up of chronic medical conditions. Patient reports ongoing pain, unable to tell if from arthritis or prostate cancer. He continues to go to Oncology he is on the full dose of abuaderon - due to his PSA increased to 3 with Frederick Ponce. He is also seeing Neurology - he is on increased dose of carbidopa.   He is taking MS Contin and will take break through tylenol. Feels like when you have the flu and feeling malaise. He is having more hot flashes today. He has energy to do things. Yesterday he did a lot of things around the house such as cleaning the dogs, around the house. He has talked with the oncologist about how he is feeling and thought to be related to the cancer. They want to increase pain medication but has constipation.  He verbalizes he is tired of being tired. He denies suicidal or homicidal ideation. He is currently being seen by palliative care.       Past Medical History:  Diagnosis Date   Arthritis    Depression    Family history of melanoma    Family history of prostate cancer    GERD (gastroesophageal reflux disease)    Hyperlipidemia    Hypertension    Parkinson disease    Prostate cancer ([place])      Family History  Problem Relation Age of Onset   Kidney disease Mother    Colon cancer Mother    Heart disease Father    Tremor Father    Parkinson's disease Father    Prostate cancer Father    Melanoma Sister    Parkinson's disease Sister    Melanoma Sister        melanoma x2   Prostate cancer Brother    Parkinson's  disease Paternal Grandmother    Lung cancer Paternal Grandfather    Parkinson's disease Paternal Aunt    Lung cancer Paternal Aunt      Current Outpatient Medications:    abiraterone acetate (ZYTIGA) 250 MG tablet, Take 4 tablets (1,000 mg total) by mouth daily. Take on an empty stomach 1 hour before or 2 hours after a meal, Disp: 120 tablet, Rfl: 1   amLODipine (NORVASC) 5 MG tablet, Take 1 tablet (5 mg total) by mouth daily., Disp: 90 tablet, Rfl: 1   atorvastatin (LIPITOR) 40 MG tablet, Take 1 tablet (40 mg total) by mouth every morning., Disp: 90 tablet, Rfl: 1   calcium-vitamin D (OSCAL WITH D) 500-5 MG-MCG tablet, Take 1 tablet by mouth 2 (two) times daily., Disp: 60 tablet, Rfl: 3   carbidopa-levodopa (SINEMET) 25-100 MG tablet, Take 1 tablet by mouth 4 (four) times daily. Take 1 hour before any meals., Disp: 360 tablet, Rfl: 1   COENZYME Q10 PO, Take 1 capsule by mouth every morning., Disp: , Rfl:    ibuprofen (ADVIL) 600 MG tablet, Take 600 mg by mouth as needed (pain)., Disp: , Rfl:    KLOR-CON M20 20 MEQ tablet, TAKE 1 TABLET BY MOUTH  DAILY (Patient not taking: Reported on 03/01/2023), Disp: 30 tablet, Rfl: 0   KRILL OIL PO, Take 1 capsule by mouth every morning., Disp: , Rfl:    mirabegron ER (MYRBETRIQ) 50 MG TB24 tablet, Take 50 mg by mouth daily., Disp: , Rfl:    multivitamin-lutein (OCUVITE-LUTEIN) CAPS capsule, Take 1 capsule by mouth every morning., Disp: , Rfl:    predniSONE (DELTASONE) 5 MG tablet, Take 1 tablet (5 mg total) by mouth daily with breakfast., Disp: 90 tablet, Rfl: 1   vitamin B-12 (CYANOCOBALAMIN) 1000 MCG tablet, Take 1,000 mcg by mouth every morning., Disp: , Rfl:    morphine (MS CONTIN) 15 MG 12 hr tablet, Take 1 tablet (15 mg total) by mouth every 8 (eight) hours., Disp: 90 tablet, Rfl: 0   Allergies  Allergen Reactions   Gabapentin Other (See Comments)    Restless legs/confusion   Celebrex [Celecoxib] Rash     Review of Systems  Constitutional:  Negative.   HENT: Negative.    Eyes: Negative.   Respiratory: Negative.    Cardiovascular: Negative.   Gastrointestinal: Negative.   Genitourinary: Negative.   Musculoskeletal:  Positive for arthralgias and myalgias.  Neurological: Negative.   Psychiatric/Behavioral: Negative.    All other systems reviewed and are negative.    Today's Vitals   02/20/23 1142  BP: 118/62  Pulse: 81  Temp: 98.2 F (36.8 C)  TempSrc: Oral  SpO2: 94%  Weight: 173 lb (78.5 kg)  Height: '5\' 8"'$  (1.727 m)   Body mass index is 26.3 kg/m.   Objective:  Physical Exam Vitals reviewed.  Constitutional:      General: He is not in acute distress.    Appearance: Normal appearance.  HENT:     Head: Normocephalic.  Cardiovascular:     Rate and Rhythm: Normal rate and regular rhythm.     Pulses: Normal pulses.     Heart sounds: Normal heart sounds. No murmur heard. Pulmonary:     Effort: Pulmonary effort is normal. No respiratory distress.     Breath sounds: Normal breath sounds. No wheezing.  Musculoskeletal:        General: Normal range of motion.  Skin:    General: Skin is warm and dry.     Capillary Refill: Capillary refill takes less than 2 seconds.  Neurological:     General: No focal deficit present.     Mental Status: He is alert and oriented to person, place, and time.  Psychiatric:        Mood and Affect: Mood normal.        Behavior: Behavior normal.        Thought Content: Thought content normal.        Judgment: Judgment normal.         Assessment And Plan:     1. Hyperlipidemia, unspecified hyperlipidemia type Comments: Cholesterol levels are within normal limits.  Continue current medications - Lipid panel  2. Essential hypertension Comments: Blood pressure is well-controlled.  Continue current medications  3. Prostate 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 [facility]) Comments: Continue follow-up with oncology.  He does verbalize that he is getting tired.  I did explain to him to discuss  with on-call allergy if would like to set up DNR/hospice   4. 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  5. Malaise and fatigue Comments: Likely related to his current chemo regimen and disease process.  Will check for any metabolic issues. - Iron, TIBC and Ferritin Panel -  TSH - Vitamin B12 - Autoimmune Profile  6. Abnormal glucose Comments: Hemoglobin A1c has been stable.  Continue focusing on healthy diet. - Hemoglobin A1c  7. Encounter for vitamin deficiency screening  9. Encounter for administration of COVID-19 vaccine Covid 19 vaccine given in office observed for 15 minutes without any adverse reaction - Pfizer Fall 2023 Covid-19 Vaccine 36yr and older  Patient was given opportunity to ask questions. Patient verbalized understanding of the plan and was able to repeat key elements of the plan. All questions were answered to their satisfaction.  JMinette Brine FNP   I, JMinette Brine FNP, have reviewed all documentation for this visit. The documentation on 02/20/23 for the exam, diagnosis, procedures, and orders are all accurate and complete.   IF YOU HAVE BEEN REFERRED TO A SPECIALIST, IT MAY TAKE 1-2 WEEKS TO SCHEDULE/PROCESS THE REFERRAL. IF YOU HAVE NOT HEARD FROM US/SPECIALIST IN TWO WEEKS, PLEASE GIVE [place] CALL AT [phone].   THE PATIENT IS ENCOURAGED TO PRACTICE SOCIAL DISTANCING DUE TO THE COVID-19 PANDEMIC.

## 2023-02-21 LAB — IRON,TIBC AND FERRITIN PANEL
Ferritin: 118 ng/mL (ref 30–400)
Iron Saturation: 24 % (ref 15–55)
Iron: 74 ug/dL (ref 38–169)
Total Iron Binding Capacity: 309 ug/dL (ref 250–450)
UIBC: 235 ug/dL (ref 111–343)

## 2023-02-21 LAB — TSH: TSH: 1.52 u[IU]/mL (ref 0.450–4.500)

## 2023-02-21 LAB — LIPID PANEL
Chol/HDL Ratio: 2.5 ratio (ref 0.0–5.0)
Cholesterol, Total: 175 mg/dL (ref 100–199)
HDL: 70 mg/dL (ref >39)
LDL Chol Calc (NIH): 91 mg/dL (ref 0–99)
Triglycerides: 75 mg/dL (ref 0–149)
VLDL Cholesterol Cal: 14 mg/dL (ref 5–40)

## 2023-02-21 LAB — AUTOIMMUNE PROFILE
Anti Nuclear Antibody (ANA): NEGATIVE
Complement C3, Serum: 144 mg/dL (ref 82–167)
dsDNA Ab: 1 IU/mL (ref 0–9)

## 2023-02-21 LAB — VITAMIN B12: Vitamin B-12: 1651 pg/mL — ABNORMAL HIGH (ref 232–1245)

## 2023-02-21 LAB — HEMOGLOBIN A1C
Est. average glucose Bld gHb Est-mCnc: 123 mg/dL
Hgb A1c MFr Bld: 5.9 % — ABNORMAL HIGH (ref 4.8–5.6)

## 2023-02-27 ENCOUNTER — Other Ambulatory Visit (HOSPITAL_COMMUNITY): Payer: Self-pay

## 2023-02-28 ENCOUNTER — Other Ambulatory Visit: Payer: Self-pay | Admitting: Physician Assistant

## 2023-02-28 DIAGNOSIS — C61 Malignant neoplasm of prostate: Secondary | ICD-10-CM

## 2023-03-01 ENCOUNTER — Inpatient Hospital Stay: Payer: Medicare Other | Attending: Nurse Practitioner | Admitting: Physician Assistant

## 2023-03-01 ENCOUNTER — Other Ambulatory Visit: Payer: Self-pay

## 2023-03-01 ENCOUNTER — Inpatient Hospital Stay: Payer: Medicare Other

## 2023-03-01 VITALS — BP 117/69 | HR 75 | Temp 97.7°F | Resp 15 | Wt 176.2 lb

## 2023-03-01 DIAGNOSIS — Z923 Personal history of irradiation: Secondary | ICD-10-CM | POA: Insufficient documentation

## 2023-03-01 DIAGNOSIS — G20A1 Parkinson's disease without dyskinesia, without mention of fluctuations: Secondary | ICD-10-CM | POA: Diagnosis not present

## 2023-03-01 DIAGNOSIS — G893 Neoplasm related pain (acute) (chronic): Secondary | ICD-10-CM | POA: Insufficient documentation

## 2023-03-01 DIAGNOSIS — C7800 Secondary malignant neoplasm of unspecified lung: Secondary | ICD-10-CM | POA: Insufficient documentation

## 2023-03-01 DIAGNOSIS — Z7952 Long term (current) use of systemic steroids: Secondary | ICD-10-CM | POA: Insufficient documentation

## 2023-03-01 DIAGNOSIS — Z79899 Other long term (current) drug therapy: Secondary | ICD-10-CM | POA: Diagnosis not present

## 2023-03-01 DIAGNOSIS — C7951 Secondary malignant neoplasm of bone: Secondary | ICD-10-CM | POA: Insufficient documentation

## 2023-03-01 DIAGNOSIS — C61 Malignant neoplasm of prostate: Secondary | ICD-10-CM | POA: Diagnosis present

## 2023-03-01 DIAGNOSIS — E785 Hyperlipidemia, unspecified: Secondary | ICD-10-CM | POA: Diagnosis not present

## 2023-03-01 DIAGNOSIS — I10 Essential (primary) hypertension: Secondary | ICD-10-CM | POA: Diagnosis not present

## 2023-03-01 DIAGNOSIS — R300 Dysuria: Secondary | ICD-10-CM

## 2023-03-01 DIAGNOSIS — D649 Anemia, unspecified: Secondary | ICD-10-CM | POA: Insufficient documentation

## 2023-03-01 LAB — CMP (CANCER CENTER ONLY)
ALT: 5 U/L (ref 0–44)
AST: 13 U/L — ABNORMAL LOW (ref 15–41)
Albumin: 4.4 g/dL (ref 3.5–5.0)
Alkaline Phosphatase: 46 U/L (ref 38–126)
Anion gap: 6 (ref 5–15)
BUN: 20 mg/dL (ref 8–23)
CO2: 27 mmol/L (ref 22–32)
Calcium: 9.5 mg/dL (ref 8.9–10.3)
Chloride: 106 mmol/L (ref 98–111)
Creatinine: 0.89 mg/dL (ref 0.61–1.24)
GFR, Estimated: >60 mL/min (ref >60)
Glucose, Bld: 108 mg/dL — ABNORMAL HIGH (ref 70–99)
Potassium: 4.3 mmol/L (ref 3.5–5.1)
Sodium: 139 mmol/L (ref 135–145)
Total Bilirubin: 1.2 mg/dL (ref 0.3–1.2)
Total Protein: 7 g/dL (ref 6.5–8.1)

## 2023-03-01 LAB — URINALYSIS, COMPLETE (UACMP) WITH MICROSCOPIC
Bilirubin Urine: NEGATIVE
Glucose, UA: NEGATIVE mg/dL
Hgb urine dipstick: NEGATIVE
Ketones, ur: NEGATIVE mg/dL
Leukocytes,Ua: NEGATIVE
Nitrite: NEGATIVE
Protein, ur: NEGATIVE mg/dL
Specific Gravity, Urine: 1.015 (ref 1.005–1.030)
pH: 5 (ref 5.0–8.0)

## 2023-03-01 LAB — CBC WITH DIFFERENTIAL (CANCER CENTER ONLY)
Abs Immature Granulocytes: 0.02 10*3/uL (ref 0.00–0.07)
Basophils Absolute: 0 10*3/uL (ref 0.0–0.1)
Basophils Relative: 1 %
Eosinophils Absolute: 0.1 10*3/uL (ref 0.0–0.5)
Eosinophils Relative: 2 %
HCT: 36.6 % — ABNORMAL LOW (ref 39.0–52.0)
Hemoglobin: 12.2 g/dL — ABNORMAL LOW (ref 13.0–17.0)
Immature Granulocytes: 0 %
Lymphocytes Relative: 15 %
Lymphs Abs: 0.9 10*3/uL (ref 0.7–4.0)
MCH: 33.3 pg (ref 26.0–34.0)
MCHC: 33.3 g/dL (ref 30.0–36.0)
MCV: 100 fL (ref 80.0–100.0)
Monocytes Absolute: 0.3 10*3/uL (ref 0.1–1.0)
Monocytes Relative: 5 %
Neutro Abs: 4.6 10*3/uL (ref 1.7–7.7)
Neutrophils Relative %: 77 %
Platelet Count: 220 10*3/uL (ref 150–400)
RBC: 3.66 MIL/uL — ABNORMAL LOW (ref 4.22–5.81)
RDW: 13.2 % (ref 11.5–15.5)
WBC Count: 5.9 10*3/uL (ref 4.0–10.5)
nRBC: 0 % (ref 0.0–0.2)

## 2023-03-01 NOTE — Progress Notes (Signed)
[place] [phone]) [phone]   [phone]) [phone]  PROGRESS NOTE  Patient Care Team: [name], FNP as PCP - General (General Practice) Pickenpack-Cousar, Carlena Sax, NP as Nurse Practitioner (Nurse Practitioner) Donivan Scull, Rodman Pickle, LCSW as Social Worker (Licensed Clinical Social Worker)  Hematological/Oncological History # Metastatic castrate sensitive prostate cancer. 01/15/22: CT-guided biopsy of a pulmonary nodule confirmed the presence of prostate cancer 01/23/2022:  Firmagon 240 mg started  Feb 2023: radiation therapy to the left sacroiliac pelvic bones completed. He received 30 Gray in 10 fractions  03/06/2022: Abiraterone 1000 mg daily with prednisone 5 mg daily started  01/03/2023: last visit with Dr. Alen Blew. Restarted 500 mg abiraterone daily.  02/06/2023: transfer care to Dr. Lorenso Courier. Increased dose to 1000 mg abiraterone daily.    Interval History:  Frederick Ponce 77 y.o. 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 The patient's last visit was on 02/06/2023 with Dr. Lorenso Courier. He presents today to review the CT scan results.   On exam today Mr. Scurto reports he is tolerating the full strength dose of abiraterone without any new or worsening toxicities. His energy levels are stable. He does have fatigue periodically but he can complete his ADLs on his own. He reports his pain is well controlled current regimen which includes MS contin 15 mg TID. He shares it is hard to differentiate cancer related pain with arthritis. He takes tynleol for arthritis pain. He reports two episodes of sharp pain radiating down his left leg when he was asleep. He reports dysuria with burning while urinating. He denies nausea, vomiting or abdominal pain. His bowel habits are unchanged without recurrent episodes of diarrhea or constipation. He denies easy bruising or signs of bleeding. He  reports mild shortness of breath with exertion only. He denies fevers, chills, sweats,chest pain or cough.  A full 10 point ROS was otherwise negative.  MEDICAL HISTORY:  Past Medical History:  Diagnosis Date   Arthritis    Depression    Family history of melanoma    Family history of prostate cancer    GERD (gastroesophageal reflux disease)    Hyperlipidemia    Hypertension    Parkinson disease    Prostate cancer ([place])     SURGICAL HISTORY: No past surgical history on file.  SOCIAL HISTORY: Social History   Socioeconomic History   Marital status: Married    Spouse name: Brynda Rim   Number of children: 0   Years of education: Not on file   Highest education level: Associate degree: academic program  Occupational History   Not on file  Tobacco Use   Smoking status: Never   Smokeless tobacco: Never  Vaping Use   Vaping Use: Never used  Substance and Sexual Activity   Alcohol use: Yes    Alcohol/week: 15.0 standard drinks of alcohol    Types: 15 Glasses of wine per week    Comment: couple glasses a night   Drug use: Never   Sexual activity: Not Currently    Birth control/protection: None  Other Topics Concern   Not on file  Social History Narrative   Not on file   Social Determinants of Health   Financial Resource Strain: Low Risk  (01/03/2023)   Overall Financial Resource Strain (CARDIA)    Difficulty of Paying Living Expenses: Not hard at all  Food Insecurity: No Food Insecurity (01/03/2023)   Hunger Vital Sign  Worried About Charity fundraiser in the Last Year: Never true    [place] in the Last Year: Never true  Transportation Needs: No Transportation Needs (01/03/2023)   PRAPARE - Hydrologist (Medical): No    Lack of Transportation (Non-Medical): No  Physical Activity: Inactive (01/03/2023)   Exercise Vital Sign    Days of Exercise per Week: 0 days    Minutes of Exercise per Session: 0 min  Stress: No Stress  Concern Present (01/03/2023)   [place]    Feeling of Stress : Not at all  Social Connections: Not on file  Intimate Partner Violence: Not At Risk (10/12/2022)   Humiliation, Afraid, Rape, and Kick questionnaire    Fear of Current or Ex-Partner: No    Emotionally Abused: No    Physically Abused: No    Sexually Abused: No    FAMILY HISTORY: Family History  Problem Relation Age of Onset   Kidney disease Mother    Colon cancer Mother    Heart disease Father    Tremor Father    Parkinson's disease Father    Prostate cancer Father    Melanoma Sister    Parkinson's disease Sister    Melanoma Sister        melanoma x2   Prostate cancer Brother    Parkinson's disease Paternal Grandmother    Lung cancer Paternal Grandfather    Parkinson's disease Paternal Aunt    Lung cancer Paternal Aunt     ALLERGIES:  is allergic to gabapentin and celebrex [celecoxib].  MEDICATIONS:  Current Outpatient Medications  Medication Sig Dispense Refill   abiraterone acetate (ZYTIGA) 250 MG tablet Take 4 tablets (1,000 mg total) by mouth daily. Take on an empty stomach 1 hour before or 2 hours after a meal 120 tablet 1   amLODipine (NORVASC) 5 MG tablet Take 1 tablet (5 mg total) by mouth daily. 90 tablet 1   atorvastatin (LIPITOR) 40 MG tablet Take 1 tablet (40 mg total) by mouth every morning. 90 tablet 1   calcium-vitamin D (OSCAL WITH D) 500-5 MG-MCG tablet Take 1 tablet by mouth 2 (two) times daily. 60 tablet 3   carbidopa-levodopa (SINEMET) 25-100 MG tablet Take 1 tablet by mouth 4 (four) times daily. Take 1 hour before any meals. 360 tablet 1   COENZYME Q10 PO Take 1 capsule by mouth every morning.     ibuprofen (ADVIL) 600 MG tablet Take 600 mg by mouth as needed (pain).     KRILL OIL PO Take 1 capsule by mouth every morning.     mirabegron ER (MYRBETRIQ) 50 MG TB24 tablet Take 50 mg by mouth daily.     morphine (MS CONTIN) 15  MG 12 hr tablet Take 1 tablet (15 mg total) by mouth every 8 (eight) hours. 90 tablet 0   multivitamin-lutein (OCUVITE-LUTEIN) CAPS capsule Take 1 capsule by mouth every morning.     predniSONE (DELTASONE) 5 MG tablet Take 1 tablet (5 mg total) by mouth daily with breakfast. 90 tablet 1   vitamin B-12 (CYANOCOBALAMIN) 1000 MCG tablet Take 1,000 mcg by mouth every morning.     KLOR-CON M20 20 MEQ tablet TAKE 1 TABLET BY MOUTH DAILY (Patient not taking: Reported on 03/01/2023) 30 tablet 0   No current facility-administered medications for this visit.    REVIEW OF SYSTEMS:   Constitutional: ( - ) fevers, ( - )  chills , ( - )  night sweats Eyes: ( - ) blurriness of vision, ( - ) double vision, ( - ) watery eyes Ears, nose, mouth, throat, and face: ( - ) mucositis, ( - ) sore throat Respiratory: ( - ) cough, ( - ) dyspnea, ( - ) wheezes Cardiovascular: ( - ) palpitation, ( - ) chest discomfort, ( - ) lower extremity swelling Gastrointestinal:  ( - ) nausea, ( - ) heartburn, ( - ) change in bowel habits Skin: ( - ) abnormal skin rashes Lymphatics: ( - ) new lymphadenopathy, ( - ) easy bruising Neurological: ( - ) numbness, ( - ) tingling, ( - ) new weaknesses Behavioral/Psych: ( - ) mood change, ( - ) new changes  All other systems were reviewed with the patient and are negative.  PHYSICAL EXAMINATION: ECOG PERFORMANCE STATUS: 1 - Symptomatic but completely ambulatory  Vitals:   03/01/23 0919  BP: 117/69  Pulse: 75  Resp: 15  Temp: 97.7 F (36.5 C)  SpO2: 99%   Filed Weights   03/01/23 0919  Weight: 176 lb 3.2 oz (79.9 kg)    GENERAL: Well-appearing elderly Caucasian male, alert, no distress and comfortable SKIN: skin color, texture, turgor are normal, no rashes or significant lesions EYES: conjunctiva are pink and non-injected, sclera clear LUNGS: clear to auscultation and percussion with normal breathing effort HEART: regular rate & rhythm and no murmurs and no lower extremity  edema Musculoskeletal: no cyanosis of digits and no clubbing  PSYCH: alert & oriented x 3, fluent speech NEURO: no focal motor/sensory deficits  LABORATORY DATA:  I have reviewed the data as listed    Latest Ref Rng & Units 03/01/2023   10:22 AM 02/04/2023    2:31 PM 01/01/2023    3:24 PM  CBC  WBC 4.0 - 10.5 K/uL 5.9  4.8  4.6   Hemoglobin 13.0 - 17.0 g/dL 12.2  12.2  11.6   Hematocrit 39.0 - 52.0 % 36.6  35.9  35.1   Platelets 150 - 400 K/uL 220  207  201        Latest Ref Rng & Units 03/01/2023   10:22 AM 02/04/2023    2:31 PM 01/01/2023    3:24 PM  CMP  Glucose 70 - 99 mg/dL 108  109  96   BUN 8 - 23 mg/dL '20  21  24   '$ Creatinine 0.61 - 1.24 mg/dL 0.89  0.85  0.85   Sodium 135 - 145 mmol/L 139  139  140   Potassium 3.5 - 5.1 mmol/L 4.3  4.4  4.9   Chloride 98 - 111 mmol/L 106  108  106   CO2 22 - 32 mmol/L '27  26  29   '$ Calcium 8.9 - 10.3 mg/dL 9.5  9.1  9.7   Total Protein 6.5 - 8.1 g/dL 7.0  6.7  6.8   Total Bilirubin 0.3 - 1.2 mg/dL 1.2  0.9  0.6   Alkaline Phos 38 - 126 U/L 46  43  43   AST 15 - 41 U/L '13  13  14   '$ ALT 0 - 44 U/L '5  5  7     '$ Lab Results  Component Value Date   MPROTEIN Not Observed 12/27/2021   Lab Results  Component Value Date   KPAFRELGTCHN 16.0 12/27/2021   LAMBDASER 12.2 12/27/2021   KAPLAMBRATIO 1.31 12/27/2021     RADIOGRAPHIC STUDIES: CT CHEST ABDOMEN PELVIS W CONTRAST  Result Date: 02/21/2023 CLINICAL DATA:  Prostate cancer  with metastasis. Assess treatment response. Bone metastasis. Rib pain. Leg weakness. EXAM: CT CHEST, ABDOMEN, AND PELVIS WITH CONTRAST TECHNIQUE: Multidetector CT imaging of the chest, abdomen and pelvis was performed following the standard protocol during bolus administration of intravenous contrast. RADIATION DOSE REDUCTION: This exam was performed according to the departmental dose-optimization program which includes automated exposure control, adjustment of the mA and/or kV according to patient size and/or use of  iterative reconstruction technique. CONTRAST:  138m OMNIPAQUE IOHEXOL 300 MG/ML  SOLN COMPARISON:  PSMA PET scan 08/16/2022 FINDINGS: CT CHEST FINDINGS Cardiovascular: No significant vascular findings. Normal heart size. No pericardial effusion. Mediastinum/Nodes: No axillary or supraclavicular adenopathy. No mediastinal or hilar adenopathy. No pericardial fluid. Esophagus normal. Lungs/Pleura: A significant interval enlargement previously seen tiny pulmonary nodules which does have radiotracer activity on BS might PET scan. Example LEFT upper lobe nodule measuring 17 mm increased from approximately 3 mm (image 58/4) LEFT lower lobe nodule measuring 17 mm (image 80/4) increased from 8 mm. No new pulmonary nodules are identified. Musculoskeletal: Again demonstrated multiple sclerotic lesions in the spine and ribs. No new lesions are present. CT ABDOMEN AND PELVIS FINDINGS Hepatobiliary: Low-density lesion in the lateral RIGHT hepatic lobe measuring 11 mm is unchanged from comparison CT consistent benign cysts. Enhancing lesion more superiorly in the RIGHT hepatic lobe measuring 14 mm is not changed from prior most consistent benign hemangioma. No evidence of liver metastasis. Pancreas: Pancreas is normal. No ductal dilatation. No pancreatic inflammation. Spleen: Normal spleen Adrenals/urinary tract: Adrenal glands normal. There is chronic dilatation of the RIGHT renal pelvis suggesting UPJ obstruction. There are bulky calculi within the RIGHT renal hilum unchanged from prior. LEFT hilar calculus also noted. No ureterolithiasis or obstructive uropathy. Bladder normal Stomach/Bowel: Abdominal aorta is normal caliber with atherosclerotic calcification. There is no retroperitoneal or periportal lymphadenopathy. No pelvic lymphadenopathy. Vascular/Lymphatic: Abdominal aorta is normal caliber with atherosclerotic calcification. No pelvic lymphadenopathy.  No retroperitoneal adenopathy. Reproductive: Prostate unremarkable  Other: Large RIGHT inguinal hernia contains a long segment nodular structure in sigmoid colon. Unchanged from prior. Musculoskeletal: Multiple sclerotic lesions in the pelvis and spine are unchanged. IMPRESSION: CHEST IMPRESSION: 1. Significant interval enlargement of solitary LEFT upper lobe and LEFT lower lobe pulmonary nodules consistent with progressive pulmonary metastasis. These nodules were very small but PSMA positive on comparison PET scan. 2. No mediastinal adenopathy. 3. Stable sclerotic skeletal metastasis. PELVIS IMPRESSION: 1. No evidence of nodal or visceral metastatic disease in the abdomen pelvis. 2. Stable sclerotic skeletal metastasis in pelvis and lumbar spine. 3. Chronic RIGHT UPJ obstruction. 4. Large RIGHT inguinal hernia contains a long segment of sigmoid colon. 5. Aortic atherosclerosis. Aortic Atherosclerosis (ICD10-I70.0). Electronically Signed   By: SSuzy BouchardM.D.   On: 02/21/2023 15:39    ASSESSMENT & PLAN JBarnard Safier89y.o. 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  # Metastatic castrate sensitive prostate cancer. # Prostate cancer metastatic to the lung -- PSA has risen to 3.0 from 1.0 on 01/01/2023.  PSA level pending.  --Labs today show stable anemia with Hgb 12.2. No other cytopenias. Creatinine and LFTs within range.  --Most recent CT scan from 02/20/2023 did show progression of lung metastases. Since patient was only taking abiraterone 500 mg daily, likely contributing to the progression.  -- Encourage patient to continue on abiraterone 1000 mg p.o. daily. -- Last dose of Eligard on 12/05/2022.  Received 30 mg injection  plan for next dose to be in April 2024. -- Return to clinic in April 2024 for repeat injection, clinic visit, and labs.  #Dysuria: --Obtained UA plus culture, awaiting results.   Orders Placed This Encounter  Procedures   Urine  Culture    Standing Status:   Future    Number of Occurrences:   1    Standing Expiration Date:   02/29/2024   Urinalysis, Complete w Microscopic    Standing Status:   Future    Number of Occurrences:   1    Standing Expiration Date:   02/29/2024    All questions were answered. The patient knows to call the clinic with any problems, questions or concerns.  A total of more than 30 minutes were spent on this encounter with face-to-face time and non-face-to-face time, including preparing to see the patient, counseling the patient and coordination of care as outlined above.   Dede Query PA-C Dept of Hematology and [place] at [facility] Phone: [phone]  03/01/2023 12:55 PM

## 2023-03-02 LAB — PROSTATE-SPECIFIC AG, SERUM (LABCORP): Prostate Specific Ag, Serum: 5.4 ng/mL — ABNORMAL HIGH (ref 0.0–4.0)

## 2023-03-03 LAB — URINE CULTURE: Culture: NO GROWTH

## 2023-03-04 ENCOUNTER — Other Ambulatory Visit: Payer: Self-pay

## 2023-03-04 ENCOUNTER — Telehealth: Payer: Self-pay | Admitting: Physician Assistant

## 2023-03-04 ENCOUNTER — Encounter: Payer: Self-pay | Admitting: General Practice

## 2023-03-04 ENCOUNTER — Other Ambulatory Visit (HOSPITAL_COMMUNITY): Payer: Self-pay

## 2023-03-04 DIAGNOSIS — N390 Urinary tract infection, site not specified: Secondary | ICD-10-CM

## 2023-03-04 DIAGNOSIS — C7951 Secondary malignant neoplasm of bone: Secondary | ICD-10-CM

## 2023-03-04 DIAGNOSIS — Z515 Encounter for palliative care: Secondary | ICD-10-CM

## 2023-03-04 DIAGNOSIS — G893 Neoplasm related pain (acute) (chronic): Secondary | ICD-10-CM

## 2023-03-04 MED ORDER — MORPHINE SULFATE ER 15 MG PO TBCR: 15.0000 mg | EXTENDED_RELEASE_TABLET | Freq: Three times a day (TID) | ORAL | 0 refills | Status: DC

## 2023-03-04 NOTE — Progress Notes (Signed)
[facility] Spiritual Care Note  Returned call from Shannon, scheduling Spiritual Care appointment for Wednesday 03/06/2023 at [place] per his request.   Pauline Good, [facility] Pager [phone] Voicemail [phone]

## 2023-03-04 NOTE — Telephone Encounter (Signed)
I called Frederick Ponce to go over lab results from 03/01/2023. PSA level did increase to 5.4 which is consistent with progression seen on recent CT imaging. Plan is to continue on full strength Zytiga and reassess in 4 weeks with Dr. Lorenso Courier.   His UA and  culture was negative for UTI. Recommend to follow up with urologist to further evaluate underlying dysuria. Patient reports that he is scheduled for a follow up visit in 2 weeks.

## 2023-03-04 NOTE — Telephone Encounter (Signed)
Pt called for a refill, see new orders.  

## 2023-03-05 ENCOUNTER — Telehealth: Payer: Self-pay

## 2023-03-05 ENCOUNTER — Other Ambulatory Visit (HOSPITAL_COMMUNITY): Payer: Self-pay

## 2023-03-05 NOTE — Telephone Encounter (Signed)
Pt LVM regarding refill, refill sent in yesterday, attempted to call pt to report that, no answer, LVM and call back number

## 2023-03-06 ENCOUNTER — Inpatient Hospital Stay: Payer: Medicare Other | Admitting: General Practice

## 2023-03-06 NOTE — Progress Notes (Signed)
[place] Spiritual Care Note  Met with Frederick Ponce in [place] office for one hour as planned, providing reflective listening, normalization of feelings, and emotional support as he shared and processed updates related to his physical health and emotional needs. He values having a neutral conversation partner who can reflect back what they are hearing from him.  Today Frederick Ponce reiterated his deep value of "quality over quantity" regarding lifespan and priorities. Some key factors in his definition of quality of life include clearheadedness and ability to be present, bowel control and his personal sense of physical dignity, and continued participation in activities he enjoys (including a glass or two of wine in the evening).  Frederick Ponce plans to follow up by phone as needed, likely around the time of his 4/18 follow-up appointment with Frederick Ponce, if not sooner.   [place], [place], [facility] Pager [phone] Voicemail [phone]

## 2023-03-07 ENCOUNTER — Other Ambulatory Visit (HOSPITAL_COMMUNITY): Payer: Self-pay

## 2023-03-08 ENCOUNTER — Other Ambulatory Visit (HOSPITAL_COMMUNITY): Payer: Self-pay

## 2023-03-08 ENCOUNTER — Other Ambulatory Visit: Payer: Self-pay

## 2023-03-08 NOTE — Progress Notes (Deleted)
[place]  Telephone:(336) [phone] Fax:(336) [phone]   Name: Trasean Holton Date: 03/08/2023 MRN: [id number]  DOB: 29-Jun-1946  Patient Care Team: [name], [place] as PCP - General (General Practice) Pickenpack-Cousar, Carlena Sax, NP as Nurse Practitioner (Nurse Practitioner) Donivan Scull, Rodman Pickle, LCSW as Social Worker (Licensed Clinical Social Worker)   I connected with [name] on 03/08/23 at  1:00 PM EDT by phone and verified that I am speaking with the correct person using two identifiers.   I discussed the limitations, risks, security and privacy concerns of performing an evaluation and management service by telemedicine and the availability of in-person appointments. I also discussed with the patient that there may be a patient responsible charge related to this service. The patient expressed understanding and agreed to proceed.   Other persons participating in the visit and their role in the encounter: n/a   Patient's location: home  Provider's location: [facility]   Chief Complaint: f/u of symptom management   REASON FOR CONSULTATION: Frederick Ponce is a 77 y.o. male with medical history including Parkinson's disease and hyperlipidemia. 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 He is having urgent radiation appointment due to iliac bone pain. Palliative ask to see for symptom management.    SOCIAL HISTORY:     reports that he has never smoked. He has never used smokeless tobacco. He reports current alcohol use of about 15.0 standard drinks of alcohol per week. He reports that he does not use drugs.  ADVANCE DIRECTIVES:  Patient is scheduled to complete advanced directives at our clinic here at the cancer center on 02/16/2022. MOST form provided.  Patient plans to review and complete at a later time.   CODE STATUS:    PAST MEDICAL HISTORY: Past Medical History:  Diagnosis Date   Arthritis    Depression    Family history of melanoma    Family history of prostate cancer    GERD (gastroesophageal reflux disease)    Hyperlipidemia    Hypertension    Parkinson disease    Prostate cancer ([place])     PAST SURGICAL HISTORY: No past surgical history on file.  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  The report date is December 21, 2022.  The CancerNext-Expanded gene panel offered by [facility] and includes sequencing and rearrangement analysis for the following 77 genes: AIP, ALK, APC*, ATM*, AXIN2, BAP1, BARD1, BLM, BMPR1A, BRCA1*, BRCA2*, BRIP1*, CDC73, CDH1*, CDK4, CDKN1B, CDKN2A, CHEK2*, CTNNA1, DICER1, FANCC, FH, FLCN, GALNT12, KIF1B, LZTR1, MAX, MEN1, MET, MLH1*, MSH2*, MSH3, MSH6*, MUTYH*, NBN, NF1*, NF2, NTHL1, PALB2*, PHOX2B, PMS2*, POT1, PRKAR1A, PTCH1, PTEN*, RAD51C*, RAD51D*, RB1, RECQL, RET, SDHA, SDHAF2, SDHB, SDHC, SDHD, SMAD4, SMARCA4, SMARCB1, SMARCE1, STK11, SUFU, TMEM127, TP53*, TSC1, TSC2, VHL and XRCC2 (sequencing and deletion/duplication); EGFR, EGLN1, HOXB13, KIT, MITF, PDGFRA, POLD1, and POLE (sequencing only); EPCAM and GREM1 (deletion/duplication only). DNA and RNA analyses performed for *  genes.      ALLERGIES:  is allergic to gabapentin and celebrex [celecoxib].  MEDICATIONS:  Current Outpatient Medications  Medication Sig Dispense Refill   abiraterone acetate (ZYTIGA) 250 MG tablet Take 4  tablets (1,000 mg total) by mouth daily. Take on an empty stomach 1 hour before or 2 hours after a meal 120 tablet 1   amLODipine (NORVASC) 5 MG tablet Take 1 tablet (5 mg total) by mouth daily. 90 tablet 1   atorvastatin (LIPITOR) 40 MG tablet Take 1 tablet (40 mg total) by mouth every morning. 90 tablet 1   calcium-vitamin D (OSCAL WITH D) 500-5 MG-MCG tablet Take 1 tablet by mouth 2 (two) times daily. 60 tablet 3   carbidopa-levodopa (SINEMET) 25-100 MG tablet Take 1 tablet by mouth 4 (four) times daily. Take 1 hour before any meals. 360 tablet 1   COENZYME Q10 PO Take 1 capsule by mouth every morning.     ibuprofen (ADVIL) 600 MG tablet Take 600 mg by mouth as needed (pain).     KLOR-CON M20 20 MEQ tablet TAKE 1 TABLET BY MOUTH DAILY (Patient not taking: Reported on 03/01/2023) 30 tablet 0   KRILL OIL PO Take 1 capsule by mouth every morning.     mirabegron ER (MYRBETRIQ) 50 MG TB24 tablet Take 50 mg by mouth daily.     morphine (MS CONTIN) 15 MG 12 hr tablet Take 1 tablet (15 mg total) by mouth every 8 (eight) hours. 90 tablet 0   multivitamin-lutein (OCUVITE-LUTEIN) CAPS capsule Take 1 capsule by mouth every morning.     predniSONE (DELTASONE) 5 MG tablet Take 1 tablet (5 mg total) by mouth daily with breakfast. 90 tablet 1   vitamin B-12 (CYANOCOBALAMIN) 1000 MCG tablet Take 1,000 mcg by mouth every morning.     No current facility-administered medications for this visit.    PERFORMANCE STATUS (ECOG) : 1 - Symptomatic but completely ambulatory   IMPRESSION:   PLAN: MS Contin 15 mg  three times daily  Tylenol extra strength as needed Cymbalta 20 mg daily Prune juice, tea, Dulcolax daily for bowel regimen  I will see patient back in 4-6 weeks in collaboration to other oncology appointments.   Patient expressed understanding and was in agreement with this plan. He also understands that He can call the clinic at any time with any questions, concerns, or complaints.    Any controlled  substances utilized were prescribed in the context of palliative care. PDMP has been reviewed.    Time Total: 45 min   Visit consisted of counseling and education dealing with the complex and emotionally intense issues of symptom management and palliative care in the setting of serious and potentially life-threatening illness.Greater than 50%  of this time was spent counseling and coordinating care related to the above assessment and plan.  Alda Lea, AGPCNP-BC  Palliative Medicine Team/[place]

## 2023-03-13 ENCOUNTER — Inpatient Hospital Stay: Payer: Medicare Other | Admitting: Nurse Practitioner

## 2023-04-02 ENCOUNTER — Other Ambulatory Visit (HOSPITAL_COMMUNITY): Payer: Self-pay

## 2023-04-04 NOTE — Progress Notes (Signed)
Palliative Medicine [facility]  Telephone:(336) [phone] [phone]) [phone]   Name: Frederick Ponce Date: 04/04/2023 MRN: [id number]  DOB: Apr 18, 1946  Patient Care Team: Arnette Felts, FNP as PCP - General (General Practice) Pickenpack-Cousar, Arty Baumgartner, NP as Nurse Practitioner (Nurse Practitioner) Alisa Graff, Pascal Lux, LCSW as Social Worker (Licensed Clinical Social Worker)   I connected with Frederick Ponce on 04/04/23 at 11:30 AM EDT by phone and verified that I am speaking with the correct person using two identifiers.   I discussed the limitations, risks, security and privacy concerns of performing an evaluation and management service by telemedicine and the availability of in-person appointments. I also discussed with the patient that there may be a patient responsible charge related to this service. The patient expressed understanding and agreed to proceed.   Other persons participating in the visit and their role in the encounter: n/a   Patient's location: home  Provider's location: [facility]   Chief Complaint: f/u of symptom management   REASON FOR CONSULTATION: Frederick Ponce is a 77 y.o. male with medical history including Parkinson's disease and hyperlipidemia. 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 He is having urgent radiation appointment due to iliac bone pain. Palliative ask to see for symptom management.    SOCIAL HISTORY:     reports that he has never smoked. He has never used smokeless tobacco. He reports current alcohol use of about 15.0 standard drinks of alcohol per week. He reports that he does not use drugs.  ADVANCE DIRECTIVES:  Patient is scheduled to complete advanced directives at our clinic here at the cancer center on 02/16/2022. MOST form provided.  Patient plans to review and complete at a later time.   CODE STATUS:    PAST MEDICAL HISTORY: Past Medical History:  Diagnosis Date   Arthritis    Depression    Family history of melanoma    Family history of prostate cancer    GERD (gastroesophageal reflux disease)    Hyperlipidemia    Hypertension    Parkinson disease    Prostate cancer (HCC)     PAST SURGICAL HISTORY: No past surgical history on file.  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  The report date is December 21, 2022.  The CancerNext-Expanded gene panel offered by [facility] and includes sequencing and rearrangement analysis for the following 77 genes: AIP, ALK, APC*, ATM*, AXIN2, BAP1, BARD1, BLM, BMPR1A, BRCA1*, BRCA2*, BRIP1*, CDC73, CDH1*, CDK4, CDKN1B, CDKN2A, CHEK2*, CTNNA1, DICER1, FANCC, FH, FLCN, GALNT12, KIF1B, LZTR1, MAX, MEN1, MET, MLH1*, MSH2*, MSH3, MSH6*, MUTYH*, NBN, NF1*, NF2, NTHL1, PALB2*, PHOX2B, PMS2*, POT1, PRKAR1A, PTCH1, PTEN*, RAD51C*, RAD51D*, RB1, RECQL, RET, SDHA, SDHAF2, SDHB, SDHC, SDHD, SMAD4, SMARCA4, SMARCB1, SMARCE1, STK11, SUFU, TMEM127, TP53*, TSC1, TSC2, VHL and XRCC2 (sequencing and deletion/duplication); EGFR, EGLN1, HOXB13, KIT, MITF, PDGFRA, POLD1, and POLE (sequencing only); EPCAM and GREM1 (deletion/duplication only). DNA and RNA analyses performed for * genes.  ALLERGIES:  is allergic to gabapentin and celebrex [celecoxib].  MEDICATIONS:  Current Outpatient Medications  Medication Sig Dispense Refill   abiraterone acetate (ZYTIGA) 250 MG tablet Take 4 tablets  (1,000 mg total) by mouth daily. Take on an empty stomach 1 hour before or 2 hours after a meal 120 tablet 1   amLODipine (NORVASC) 5 MG tablet Take 1 tablet (5 mg total) by mouth daily. 90 tablet 1   atorvastatin (LIPITOR) 40 MG tablet Take 1 tablet (40 mg total) by mouth every morning. 90 tablet 1   calcium-vitamin D (OSCAL WITH D) 500-5 MG-MCG tablet Take 1 tablet by mouth 2 (two) times daily. 60 tablet 3   carbidopa-levodopa (SINEMET) 25-100 MG tablet Take 1 tablet by mouth 4 (four) times daily. Take 1 hour before any meals. 360 tablet 1   COENZYME Q10 PO Take 1 capsule by mouth every morning.     ibuprofen (ADVIL) 600 MG tablet Take 600 mg by mouth as needed (pain).     KLOR-CON M20 20 MEQ tablet TAKE 1 TABLET BY MOUTH DAILY (Patient not taking: Reported on 03/01/2023) 30 tablet 0   KRILL OIL PO Take 1 capsule by mouth every morning.     mirabegron ER (MYRBETRIQ) 50 MG TB24 tablet Take 50 mg by mouth daily.     morphine (MS CONTIN) 15 MG 12 hr tablet Take 1 tablet (15 mg total) by mouth every 8 (eight) hours. 90 tablet 0   multivitamin-lutein (OCUVITE-LUTEIN) CAPS capsule Take 1 capsule by mouth every morning.     predniSONE (DELTASONE) 5 MG tablet Take 1 tablet (5 mg total) by mouth daily with breakfast. 90 tablet 1   vitamin B-12 (CYANOCOBALAMIN) 1000 MCG tablet Take 1,000 mcg by mouth every morning.     No current facility-administered medications for this visit.    PERFORMANCE STATUS (ECOG) : 1 - Symptomatic but completely ambulatory  Assessing NAD, ambulatory RRR Normal breathing pattern AAO x4  IMPRESSION:  Frederick Ponce presents to clinic for symptom management follow-up.  No acute distress identified.  His husband Frederick HuaDavid is present.  They were seen by Dr. Leonides Schanzorsey today.  Patient is emotional expressing his PSA elevation and plans to change oral chemo due to progression.  He and husband continue to remain hopeful that with new medication this will offer him some stability as well as  decrease in side effects.  Continues to take things one day at a time.  He is remaining as active as possible.  Frederick Ponce denies nausea, vomiting, diarrhea.  Does endorse constipation related to opioid use.  This is being controlled with daily stool softeners and bowel regimen.  Appetite is good.  He endorses increase in pain and discomfort.  Some days are better than others.  He would like to explore additional options. He developed rash with Celebrex and dizziness with Cymbalta. We discussed use of MS Contin every 8 hours as he was sensitive in the past to increased dosage of 30mg . Education provided on use of meloxicam. Education provided on use and signs of reaction. Patient verbalized understanding.   PLAN: MS Contin 15 mg  three times daily  Tylenol extra strength as needed Meloxicam 5 mg daily as needed Prune juice, tea, Dulcolax daily for bowel regimen  I will see patient back in 4-6 weeks in collaboration to other oncology appointments.  We will follow-up with patient by phone in 1 week for close follow-up.  Patient expressed understanding and was in agreement with this plan. He also  understands that He can call the clinic at any time with any questions, concerns, or complaints.    Any controlled substances utilized were prescribed in the context of palliative care. PDMP has been reviewed.    Visit consisted of counseling and education dealing with the complex and emotionally intense issues of symptom management and palliative care in the setting of serious and potentially life-threatening illness.Greater than 50%  of this time was spent counseling and coordinating care related to the above assessment and plan.  Frederick Ponce, AGPCNP-BC  Palliative Medicine Team/[facility]  *Please note that this is a verbal dictation therefore any spelling or grammatical errors are due to the "Dragon [employer] One" system interpretation.

## 2023-04-05 ENCOUNTER — Other Ambulatory Visit: Payer: Self-pay

## 2023-04-05 DIAGNOSIS — N390 Urinary tract infection, site not specified: Secondary | ICD-10-CM

## 2023-04-05 DIAGNOSIS — G893 Neoplasm related pain (acute) (chronic): Secondary | ICD-10-CM

## 2023-04-05 DIAGNOSIS — C61 Malignant neoplasm of prostate: Secondary | ICD-10-CM

## 2023-04-05 DIAGNOSIS — Z515 Encounter for palliative care: Secondary | ICD-10-CM

## 2023-04-05 MED ORDER — MORPHINE SULFATE ER 15 MG PO TBCR
15.0000 mg | EXTENDED_RELEASE_TABLET | Freq: Three times a day (TID) | ORAL | 0 refills | Status: DC
Start: 2023-04-05 — End: 2023-05-06

## 2023-04-05 NOTE — Telephone Encounter (Signed)
Pt called for refill, see new orders.  

## 2023-04-08 ENCOUNTER — Inpatient Hospital Stay: Payer: Medicare Other | Attending: Nurse Practitioner

## 2023-04-08 ENCOUNTER — Other Ambulatory Visit: Payer: Self-pay

## 2023-04-08 ENCOUNTER — Other Ambulatory Visit: Payer: Self-pay | Admitting: Hematology and Oncology

## 2023-04-08 DIAGNOSIS — C7951 Secondary malignant neoplasm of bone: Secondary | ICD-10-CM

## 2023-04-08 DIAGNOSIS — C61 Malignant neoplasm of prostate: Secondary | ICD-10-CM | POA: Diagnosis present

## 2023-04-08 DIAGNOSIS — Z79899 Other long term (current) drug therapy: Secondary | ICD-10-CM | POA: Diagnosis not present

## 2023-04-08 DIAGNOSIS — C7800 Secondary malignant neoplasm of unspecified lung: Secondary | ICD-10-CM | POA: Insufficient documentation

## 2023-04-08 DIAGNOSIS — R300 Dysuria: Secondary | ICD-10-CM | POA: Insufficient documentation

## 2023-04-08 LAB — CBC WITH DIFFERENTIAL (CANCER CENTER ONLY)
Abs Immature Granulocytes: 0.01 10*3/uL (ref 0.00–0.07)
Basophils Absolute: 0 10*3/uL (ref 0.0–0.1)
Basophils Relative: 0 %
Eosinophils Absolute: 0.2 10*3/uL (ref 0.0–0.5)
Eosinophils Relative: 3 %
HCT: 35.8 % — ABNORMAL LOW (ref 39.0–52.0)
Hemoglobin: 11.9 g/dL — ABNORMAL LOW (ref 13.0–17.0)
Immature Granulocytes: 0 %
Lymphocytes Relative: 15 %
Lymphs Abs: 0.9 10*3/uL (ref 0.7–4.0)
MCH: 33.6 pg (ref 26.0–34.0)
MCHC: 33.2 g/dL (ref 30.0–36.0)
MCV: 101.1 fL — ABNORMAL HIGH (ref 80.0–100.0)
Monocytes Absolute: 0.4 10*3/uL (ref 0.1–1.0)
Monocytes Relative: 7 %
Neutro Abs: 4.6 10*3/uL (ref 1.7–7.7)
Neutrophils Relative %: 75 %
Platelet Count: 220 10*3/uL (ref 150–400)
RBC: 3.54 MIL/uL — ABNORMAL LOW (ref 4.22–5.81)
RDW: 12.7 % (ref 11.5–15.5)
WBC Count: 6.1 10*3/uL (ref 4.0–10.5)
nRBC: 0 % (ref 0.0–0.2)

## 2023-04-08 LAB — CMP (CANCER CENTER ONLY)
ALT: <5 U/L (ref 0–44)
AST: 13 U/L — ABNORMAL LOW (ref 15–41)
Albumin: 4.2 g/dL (ref 3.5–5.0)
Alkaline Phosphatase: 55 U/L (ref 38–126)
Anion gap: 5 (ref 5–15)
BUN: 19 mg/dL (ref 8–23)
CO2: 28 mmol/L (ref 22–32)
Calcium: 9.4 mg/dL (ref 8.9–10.3)
Chloride: 106 mmol/L (ref 98–111)
Creatinine: 0.87 mg/dL (ref 0.61–1.24)
GFR, Estimated: >60 mL/min (ref >60)
Glucose, Bld: 96 mg/dL (ref 70–99)
Potassium: 4.5 mmol/L (ref 3.5–5.1)
Sodium: 139 mmol/L (ref 135–145)
Total Bilirubin: 1 mg/dL (ref 0.3–1.2)
Total Protein: 6.6 g/dL (ref 6.5–8.1)

## 2023-04-09 LAB — PROSTATE-SPECIFIC AG, SERUM (LABCORP): Prostate Specific Ag, Serum: 11.2 ng/mL — ABNORMAL HIGH (ref 0.0–4.0)

## 2023-04-09 LAB — TESTOSTERONE: Testosterone: <3 ng/dL — ABNORMAL LOW (ref 264–916)

## 2023-04-10 ENCOUNTER — Other Ambulatory Visit: Payer: Self-pay

## 2023-04-10 NOTE — Progress Notes (Unsigned)
[facility] Telephone:(336) [phone]   [phone]  PROGRESS NOTE  Patient Care Team: Arnette Felts, FNP as PCP - General (General Practice) Pickenpack-Cousar, Arty Baumgartner, NP as Nurse Practitioner (Nurse Practitioner) Alisa Graff, Pascal Lux, LCSW as Social Worker (Licensed Clinical Social Worker)  Hematological/Oncological History # Metastatic castrate sensitive prostate cancer. 01/15/22: CT-guided biopsy of a pulmonary nodule confirmed the presence of prostate cancer 01/23/2022:  Firmagon 240 mg started  Feb 2023: radiation therapy to the left sacroiliac pelvic bones completed. He received 30 Gray in 10 fractions  03/06/2022: Abiraterone 1000 mg daily with prednisone 5 mg daily started  01/03/2023: last visit with Dr. Clelia Croft. Restarted 500 mg abiraterone daily.  02/06/2023: transfer care to Dr. Leonides Schanz. Increased dose to 1000 mg abiraterone daily.  04/11/2023: Due to increasing PSA transition to olaparib 300 mg twice daily  Interval History:  Frederick Ponce 77 y.o. 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 The patient's last visit was on 03/01/2023.   On exam today Mr. Magner reports he continues taking his Zytiga 1000 mg p.o. daily without difficulty.  He reports that he is also scheduled for his Lupron shot today.  He notes that he is beginning to have pain in his left leg very similar to the pain he had prior to receiving radiation to his prostate cancer.  He reports that it began worsening about 6 weeks ago.  He discussed it is a sharp pain and he has been taking Tylenol for it.  He also notes that his arthritis is poorly controlled and he takes morphine as well.  He reports that under no circumstances would he like to proceed with chemotherapy.  He reports mild shortness of breath with exertion only. He denies fevers, chills, sweats,chest pain or cough.  A full 10 point ROS was  otherwise negative.  Today we discussed the results of his rising PSA and need to transition therapies.  He has a BRCA mutation and therefore would qualify for olaparib therapy.  We recommended continuing Zytiga until he has a lapper hip and hand and then transitioning over.  MEDICAL HISTORY:  Past Medical History:  Diagnosis Date   Arthritis    Depression    Family history of melanoma    Family history of prostate cancer    GERD (gastroesophageal reflux disease)    Hyperlipidemia    Hypertension    Parkinson disease    Prostate cancer     SURGICAL HISTORY: No past surgical history on file.  SOCIAL HISTORY: Social History   Socioeconomic History   Marital status: Married    Spouse name: Janae Sauce   Number of children: 0   Years of education: Not on file   Highest education level: Associate degree: academic program  Occupational History   Not on file  Tobacco Use   Smoking status: Never   Smokeless tobacco: Never  Vaping Use   Vaping Use: Never used  Substance and Sexual Activity   Alcohol use: Yes    Alcohol/week: 15.0 standard drinks of alcohol    Types: 15 Glasses of wine per week    Comment: couple glasses a night   Drug use: Never   Sexual activity: Not Currently    Birth control/protection: None  Other Topics Concern   Not on file  Social History Narrative   Not on file   Social Determinants of Health   Financial Resource Strain:  Low Risk  (01/03/2023)   Overall Financial Resource Strain (CARDIA)    Difficulty of Paying Living Expenses: Not hard at all  Food Insecurity: No Food Insecurity (01/03/2023)   Hunger Vital Sign    Worried About Running Out of Food in the Last Year: Never true    Ran Out of Food in the Last Year: Never true  Transportation Needs: No Transportation Needs (01/03/2023)   PRAPARE - Administrator, Civil Service (Medical): No    Lack of Transportation (Non-Medical): No  Physical Activity: Inactive (01/03/2023)    Exercise Vital Sign    Days of Exercise per Week: 0 days    Minutes of Exercise per Session: 0 min  Stress: No Stress Concern Present (01/03/2023)   [employer] of Occupational Health - Occupational Stress Questionnaire    Feeling of Stress : Not at all  Social Connections: Not on file  Intimate Partner Violence: Not At Risk (10/12/2022)   Humiliation, Afraid, Rape, and Kick questionnaire    Fear of Current or Ex-Partner: No    Emotionally Abused: No    Physically Abused: No    Sexually Abused: No    FAMILY HISTORY: Family History  Problem Relation Age of Onset   Kidney disease Mother    Colon cancer Mother    Heart disease Father    Tremor Father    Parkinson's disease Father    Prostate cancer Father    Melanoma Sister    Parkinson's disease Sister    Melanoma Sister        melanoma x2   Prostate cancer Brother    Parkinson's disease Paternal Grandmother    Lung cancer Paternal Grandfather    Parkinson's disease Paternal Aunt    Lung cancer Paternal Aunt     ALLERGIES:  is allergic to gabapentin and celebrex [celecoxib].  MEDICATIONS:  Current Outpatient Medications  Medication Sig Dispense Refill   olaparib (LYNPARZA) 150 MG tablet Take 2 tablets (300 mg total) by mouth 2 (two) times daily. Swallow whole. May take with food to decrease nausea and vomiting. 120 tablet 3   abiraterone acetate (ZYTIGA) 250 MG tablet Take 4 tablets (1,000 mg total) by mouth daily. Take on an empty stomach 1 hour before or 2 hours after a meal 120 tablet 1   amLODipine (NORVASC) 5 MG tablet Take 1 tablet (5 mg total) by mouth daily. 90 tablet 1   atorvastatin (LIPITOR) 40 MG tablet Take 1 tablet (40 mg total) by mouth every morning. 90 tablet 1   calcium-vitamin D (OSCAL WITH D) 500-5 MG-MCG tablet Take 1 tablet by mouth 2 (two) times daily. 60 tablet 3   carbidopa-levodopa (SINEMET) 25-100 MG tablet Take 1 tablet by mouth 4 (four) times daily. Take 1 hour before any meals. 360 tablet  1   COENZYME Q10 PO Take 1 capsule by mouth every morning.     KRILL OIL PO Take 1 capsule by mouth every morning.     meloxicam (MOBIC) 7.5 MG tablet Take 1 tablet (7.5 mg total) by mouth daily as needed for pain. 15 tablet 0   morphine (MS CONTIN) 15 MG 12 hr tablet Take 1 tablet (15 mg total) by mouth every 8 (eight) hours. 90 tablet 0   multivitamin-lutein (OCUVITE-LUTEIN) CAPS capsule Take 1 capsule by mouth every morning.     predniSONE (DELTASONE) 5 MG tablet Take 1 tablet (5 mg total) by mouth daily with breakfast. 90 tablet 1   vitamin B-12 (CYANOCOBALAMIN) 1000  MCG tablet Take 1,000 mcg by mouth every morning.     No current facility-administered medications for this visit.    REVIEW OF SYSTEMS:   Constitutional: ( - ) fevers, ( - )  chills , ( - ) night sweats Eyes: ( - ) blurriness of vision, ( - ) double vision, ( - ) watery eyes Ears, nose, mouth, throat, and face: ( - ) mucositis, ( - ) sore throat Respiratory: ( - ) cough, ( - ) dyspnea, ( - ) wheezes Cardiovascular: ( - ) palpitation, ( - ) chest discomfort, ( - ) lower extremity swelling Gastrointestinal:  ( - ) nausea, ( - ) heartburn, ( - ) change in bowel habits Skin: ( - ) abnormal skin rashes Lymphatics: ( - ) new lymphadenopathy, ( - ) easy bruising Neurological: ( - ) numbness, ( - ) tingling, ( - ) new weaknesses Behavioral/Psych: ( - ) mood change, ( - ) new changes  All other systems were reviewed with the patient and are negative.  PHYSICAL EXAMINATION: ECOG PERFORMANCE STATUS: 1 - Symptomatic but completely ambulatory  Vitals:   04/11/23 1108  BP: 139/66  Pulse: 75  Resp: 14  Temp: (!) 97.3 F (36.3 C)  SpO2: 96%    Filed Weights   04/11/23 1108  Weight: 175 lb 8 oz (79.6 kg)     GENERAL: Well-appearing elderly Caucasian male, alert, no distress and comfortable SKIN: skin color, texture, turgor are normal, no rashes or significant lesions EYES: conjunctiva are pink and non-injected, sclera  clear LUNGS: clear to auscultation and percussion with normal breathing effort HEART: regular rate & rhythm and no murmurs and no lower extremity edema Musculoskeletal: no cyanosis of digits and no clubbing  PSYCH: alert & oriented x 3, fluent speech NEURO: no focal motor/sensory deficits  LABORATORY DATA:  I have reviewed the data as listed    Latest Ref Rng & Units 04/08/2023    9:28 AM 03/01/2023   10:22 AM 02/04/2023    2:31 PM  CBC  WBC 4.0 - 10.5 K/uL 6.1  5.9  4.8   Hemoglobin 13.0 - 17.0 g/dL 16.1  09.6  04.5   Hematocrit 39.0 - 52.0 % 35.8  36.6  35.9   Platelets 150 - 400 K/uL 220  220  207        Latest Ref Rng & Units 04/08/2023    9:28 AM 03/01/2023   10:22 AM 02/04/2023    2:31 PM  CMP  Glucose 70 - 99 mg/dL 96  409  811   BUN 8 - 23 mg/dL Creatinine 0.61 - 1.24 mg/dL 9.14  7.82  9.56   Sodium 135 - 145 mmol/L 139  139  139   Potassium 3.5 - 5.1 mmol/L 4.5  4.3  4.4   Chloride 98 - 111 mmol/L 106  106  108   CO2 22 - 32 mmol/L Calcium 8.9 - 10.3 mg/dL 9.4  9.5  9.1   Total Protein 6.5 - 8.1 g/dL 6.6  7.0  6.7   Total Bilirubin 0.3 - 1.2 mg/dL 1.0  1.2  0.9   Alkaline Phos 38 - 126 U/L 55  46  43   AST 15 - 41 U/L ALT 0 - 44 U/L 5  5  5      Lab Results  Component Value Date   MPROTEIN Not Observed  12/27/2021   Lab Results  Component Value Date   KPAFRELGTCHN 16.0 12/27/2021   LAMBDASER 12.2 12/27/2021   KAPLAMBRATIO 1.31 12/27/2021     RADIOGRAPHIC STUDIES: No results found.  ASSESSMENT & PLAN Prateek Christan 77 y.o. 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  # Metastatic castrate sensitive prostate cancer. 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   --Most recent CT scan from 02/20/2023 did show progression of lung metastases. Since patient was only taking  abiraterone 500 mg daily, likely contributing to the progression.  --continue on abiraterone 1000 mg p.o. daily until he obtains his olaparib 300 mg twice daily. --Once he receives olaparib recommend discontinuation of the abiraterone.   -- Last dose of Eligard on 04/11/2023.  Received 30 mg injection plan for next dose to be in July 2024. -- labs today show white blood cell 6.1, hemoglobin 10.9, MCV 101.1, and platelets of 220 -- Return to clinic in 4 weeks to re-evaluate.   #Dysuria: --Obtained UA plus culture, awaiting results.   No orders of the defined types were placed in this encounter.   All questions were answered. The patient knows to call the clinic with any problems, questions or concerns.  A total of more than 30 minutes were spent on this encounter with face-to-face time and non-face-to-face time, including preparing to see the patient, counseling the patient and coordination of care as outlined above.   Ulysees Barns, MD Department of Hematology/Oncology [facility] at [facility] Phone: [phone] Pager: [phone] Email: [email or web address]   04/11/2023 5:29 PM

## 2023-04-11 ENCOUNTER — Other Ambulatory Visit: Payer: Self-pay

## 2023-04-11 ENCOUNTER — Other Ambulatory Visit: Payer: Medicare Other

## 2023-04-11 ENCOUNTER — Other Ambulatory Visit (HOSPITAL_COMMUNITY): Payer: Self-pay

## 2023-04-11 ENCOUNTER — Inpatient Hospital Stay: Payer: Medicare Other

## 2023-04-11 ENCOUNTER — Inpatient Hospital Stay (HOSPITAL_BASED_OUTPATIENT_CLINIC_OR_DEPARTMENT_OTHER): Payer: Medicare Other | Admitting: Nurse Practitioner

## 2023-04-11 ENCOUNTER — Inpatient Hospital Stay (HOSPITAL_BASED_OUTPATIENT_CLINIC_OR_DEPARTMENT_OTHER): Payer: Medicare Other | Admitting: Hematology and Oncology

## 2023-04-11 ENCOUNTER — Encounter: Payer: Self-pay | Admitting: Nurse Practitioner

## 2023-04-11 VITALS — BP 139/66 | HR 75 | Temp 97.3°F | Resp 14 | Wt 175.5 lb

## 2023-04-11 DIAGNOSIS — R300 Dysuria: Secondary | ICD-10-CM

## 2023-04-11 DIAGNOSIS — C61 Malignant neoplasm of prostate: Secondary | ICD-10-CM

## 2023-04-11 DIAGNOSIS — Z515 Encounter for palliative care: Secondary | ICD-10-CM

## 2023-04-11 DIAGNOSIS — C7951 Secondary malignant neoplasm of bone: Secondary | ICD-10-CM

## 2023-04-11 DIAGNOSIS — G893 Neoplasm related pain (acute) (chronic): Secondary | ICD-10-CM

## 2023-04-11 MED ORDER — MELOXICAM 7.5 MG PO TABS: 7.5000 mg | ORAL_TABLET | Freq: Every day | ORAL | 0 refills | Status: DC | PRN

## 2023-04-11 MED ORDER — OLAPARIB 150 MG PO TABS
300.0000 mg | ORAL_TABLET | Freq: Two times a day (BID) | ORAL | 3 refills | Status: DC
  Filled 2023-04-11: qty 120, 30d supply, fill #0

## 2023-04-11 MED ORDER — KETOROLAC TROMETHAMINE 30 MG/ML IJ SOLN
30.0000 mg | Freq: Once | INTRAMUSCULAR | Status: AC
  Administered 2023-04-11: 30 mg via INTRAMUSCULAR
  Filled 2023-04-11: qty 1

## 2023-04-11 MED ORDER — DENOSUMAB 120 MG/1.7ML ~~LOC~~ SOLN
120.0000 mg | Freq: Once | SUBCUTANEOUS | Status: AC
  Administered 2023-04-11: 120 mg via SUBCUTANEOUS
  Filled 2023-04-11: qty 1.7

## 2023-04-11 MED ORDER — MELOXICAM 5 MG PO CAPS
5.0000 mg | ORAL_CAPSULE | Freq: Every day | ORAL | 0 refills | Status: DC | PRN
Start: 2023-04-11 — End: 2023-04-11

## 2023-04-11 NOTE — Telephone Encounter (Signed)
Notified by pt that pharmacy does not carry meloxicam , instead 7.5mg  caps were ordered per [name], NP, pt made aware no further needs at this time.

## 2023-04-12 ENCOUNTER — Other Ambulatory Visit (HOSPITAL_COMMUNITY): Payer: Self-pay

## 2023-04-12 ENCOUNTER — Other Ambulatory Visit: Payer: Self-pay

## 2023-04-12 ENCOUNTER — Telehealth: Payer: Self-pay | Admitting: Hematology and Oncology

## 2023-04-12 ENCOUNTER — Telehealth: Payer: Self-pay | Admitting: Pharmacy Technician

## 2023-04-12 ENCOUNTER — Telehealth: Payer: Self-pay | Admitting: Pharmacist

## 2023-04-12 NOTE — Telephone Encounter (Signed)
Oral Oncology Patient Advocate Encounter  After completing a benefits investigation, prior authorization for Garey Ham is not required at this time as the patient does not have prescription benefits.    Jinger Neighbors, CPhT-Adv Oncology Pharmacy Patient Advocate [facility] Direct Number: [phone]  Fax: [phone]

## 2023-04-12 NOTE — Telephone Encounter (Signed)
Reached out to patient to schedule per 4/18 LOS, left voicemail 

## 2023-04-12 NOTE — Telephone Encounter (Signed)
Oral Oncology Pharmacist Encounter  Received new prescription for Lynparza (olaparib) for the treatment of metastatic, castration resistant, prostate cancer, BRCA2 mutated in conjunction with ADT, planned duration until disease progression or unacceptable drug toxicity.  CBC w/ Diff and CMP from 04/08/23 assessed, no relevant lab abnormalities requiring baseline dose adjustment required at this time. Patient's most recent PSA from 04/08/23 wsa 11.2 ng/mL, up from 5.4 ng/mL in March. Prescription dose and frequency assessed for appropriateness.   Current medication list in Epic reviewed, no relevant/significant DDIs with [place] identified.  Evaluated chart and no patient barriers to medication adherence noted.   Patient agreement for treatment documented in MD note on 04/12/23.  Prescription has been e-scribed to the [facility] for benefits analysis and approval.  Oral Oncology Clinic will continue to follow for insurance authorization, copayment issues, initial counseling and start date.  Lenord Carbo, PharmD, BCPS, [facility] Hematology/Oncology Clinical Pharmacist [facility] Oral Chemotherapy Navigation Clinics [phone] 04/12/2023 8:49 AM

## 2023-04-12 NOTE — Telephone Encounter (Signed)
Oral Oncology Patient Advocate Encounter   Received notification that the application for assistance for Lynparza through [employer] has been denied due to patient does not meet eligibility requirements.   [employer] phone number [phone].   This decision will be appealed. Supporting documentation to be sent to plan once received.   Jinger Neighbors, CPhT-Adv Oncology Pharmacy Patient Advocate [facility] Direct Number: [phone]  Fax: [phone]

## 2023-04-12 NOTE — Telephone Encounter (Signed)
Oral Oncology Patient Advocate Encounter   Spoke with patient regarding assistance options for this medication.   Discussed income requirements for the program.  Patient is agreeable to discuss with Dr. Leonides Schanz regarding other options that may have assistance available.  Jinger Neighbors, CPhT-Adv Oncology Pharmacy Patient Advocate [facility] Direct Number: [phone]  Fax: [phone]

## 2023-04-17 ENCOUNTER — Telehealth: Payer: Self-pay | Admitting: Hematology and Oncology

## 2023-04-17 ENCOUNTER — Telehealth: Payer: Self-pay | Admitting: *Deleted

## 2023-04-17 NOTE — Telephone Encounter (Signed)
Received vm message from pt requesting a call back regarding transitioning to new treatment plan for his prostate cancer. Spoke with him. Pt voiced concern about the time frame regarding the change in his treatment. Advised that that since pt did not qualify for financial assistance for the Lynparza, Dr. Leonides Schanz and the pharmacy are working on the next oral chemo choice. Pt voiced frustration with this. Advised that they are working as expeditiously as possible and that we will advise him of the situation as soon as we have infiormation to share. Pt states he finished the last of his Abiotarone on 04/14/23. Dr. Leonides Schanz states not to refill this as the patient will be transitioned to another drug soon. Pt voiced understanding. Pt also asked to have his next appt on 05/08/23 changed to another day that week as he is not able to come on the 15 th. Advised that I will send a message to scheduling and that they will call him with new date and time.  Pt voiced understanding.

## 2023-04-17 NOTE — Telephone Encounter (Signed)
Reached out to patient to reschedule per 4/24 IB, caller answered them hung up will  try again.

## 2023-04-17 NOTE — Telephone Encounter (Signed)
Reached out to patient to schedule injection per 4/24 IB, left voicemail.

## 2023-04-17 NOTE — Telephone Encounter (Signed)
TCT patient regarding need for Eligard injection. No answer but was able to leave detailed message on his identified phone. Advised that he was due for his injection this week. Advised that scheduling would be contacting him to set up an appt day and time either Thursday or Friday.Advised he could call back with any questions or concerns @ [phone]

## 2023-04-18 ENCOUNTER — Other Ambulatory Visit (HOSPITAL_COMMUNITY): Payer: Self-pay

## 2023-04-18 ENCOUNTER — Telehealth: Payer: Self-pay | Admitting: Pharmacy Technician

## 2023-04-18 ENCOUNTER — Telehealth: Payer: Self-pay | Admitting: Pharmacist

## 2023-04-18 DIAGNOSIS — C61 Malignant neoplasm of prostate: Secondary | ICD-10-CM

## 2023-04-18 NOTE — Telephone Encounter (Signed)
Oral Oncology Patient Advocate Encounter   Began application for assistance for Rubraca through [employer]   Application will be submitted upon completion of necessary supporting documentation. Documentation will be completed and held at office until patient has confirmed their decision to proceed with this therapy.    Jinger Neighbors, CPhT-Adv Oncology Pharmacy Patient Advocate [facility] Direct Number: [phone]  Fax: [phone]

## 2023-04-18 NOTE — Telephone Encounter (Signed)
Oral Chemotherapy Pharmacist Encounter   Due to patient's household income exceeding limits to qualify for [place] patient assistance for [place], [place] per Dr. Leonides Schanz to switch to Rubraca at this time and try and obtain patient assistance through their program, pharma&.   Lenord Carbo, PharmD, BCPS, [facility] Hematology/Oncology Clinical Pharmacist [facility] and [facility] Oral Chemotherapy Navigation Clinics [phone] 04/18/2023 1:43 PM

## 2023-04-18 NOTE — Telephone Encounter (Signed)
Oral Oncology Pharmacist Encounter  Received new prescription for Rubraca (rucaparib) for the treatment of metastatic, castration resistant, prostate cancer, BRCA2 mutated in conjunction with ADT, planned duration until disease progression or unacceptable drug toxicity.   CBC w/ Diff and CMP from 04/08/23 assessed, no relevant lab abnormalities noted requiring baseline dose adjustment at this time. Plan will be for patient to take Rubraca 300 mg tablets, 2 tablets (600 mg total) by mouth 2 (two) times daily.  Current medication list in Epic reviewed, no relevant/significant DDIs with Rubraca identified.  Evaluated chart and no patient barriers to medication adherence noted.   Will proceed with applying for patient assistance at this time to obtain Rubraca as patient does not have prescription drug insurance.   Oral Oncology Clinic will continue to follow for initial counseling and start date.  Lenord Carbo, PharmD, BCPS, BCOP Hematology/Oncology Clinical Pharmacist [facility] and [facility] Oral Chemotherapy Navigation Clinics [phone] 04/18/2023 1:51 PM

## 2023-04-19 ENCOUNTER — Other Ambulatory Visit: Payer: Self-pay

## 2023-04-19 ENCOUNTER — Inpatient Hospital Stay: Payer: Medicare Other

## 2023-04-19 VITALS — BP 131/62 | HR 72 | Temp 98.5°F | Resp 16

## 2023-04-19 DIAGNOSIS — C61 Malignant neoplasm of prostate: Secondary | ICD-10-CM | POA: Diagnosis not present

## 2023-04-19 MED ORDER — LEUPROLIDE ACETATE (4 MONTH) 30 MG ~~LOC~~ KIT
30.0000 mg | PACK | SUBCUTANEOUS | Status: DC
  Administered 2023-04-19: 30 mg via SUBCUTANEOUS
  Filled 2023-04-19: qty 30

## 2023-04-19 NOTE — Patient Instructions (Signed)
Leuprolide Suspension for Injection (Prostate Cancer) What is this medication? LEUPROLIDE (loo PROE lide) reduces the symptoms of prostate cancer. It works by decreasing levels of the hormone testosterone in the body. This prevents prostate cancer cells from spreading or growing. This medicine may be used for other purposes; ask your health care provider or pharmacist if you have questions. COMMON BRAND NAME(S): Eligard, Lupron Depot, Lupron Depot-Ped, Lutrate Depot, Viadur What should I tell my care team before I take this medication? They need to know if you have any of these conditions: Diabetes Heart disease Heart failure High or low levels of electrolytes, such as magnesium, potassium, or sodium in your blood Irregular heartbeat or rhythm Seizures An unusual or allergic reaction to leuprolide, other medications, foods, dyes, or preservatives Pregnant or trying to get pregnant Breast-feeding How should I use this medication? This medication is injected under the skin or into a muscle. It is given by your care team in a hospital or clinic setting. Talk to your care team about the use of this medication in children. Special care may be needed. Overdosage: If you think you have taken too much of this medicine contact a poison control center or emergency room at once. NOTE: This medicine is only for you. Do not share this medicine with others. What if I miss a dose? Keep appointments for follow-up doses. It is important not to miss your dose. Call your care team if you are unable to keep an appointment. What may interact with this medication? Do not take this medication with any of the following: Cisapride Dronedarone Ketoconazole Levoketoconazole Pimozide Thioridazine This medication may also interact with the following: Other medications that cause heart rhythm changes This list may not describe all possible interactions. Give your health care provider a list of all the medicines,  herbs, non-prescription drugs, or dietary supplements you use. Also tell them if you smoke, drink alcohol, or use illegal drugs. Some items may interact with your medicine. What should I watch for while using this medication? Visit your care team for regular checks on your progress. Tell your care team if your symptoms do not start to get better or if they get worse. This medication may increase blood sugar. The risk may be higher in patients who already have diabetes. Ask your care team what you can do to lower the risk of diabetes while taking this medication. This medication may cause infertility. Talk to your care team if you are concerned about your fertility. Heart attacks and strokes have been reported with the use of this medication. Get emergency help if you develop signs or symptoms of a heart attack or stroke. Talk to your care team about the risks and benefits of this medication. What side effects may I notice from receiving this medication? Side effects that you should report to your care team as soon as possible: Allergic reactions--skin rash, itching, hives, swelling of the face, lips, tongue, or throat Heart attack--pain or tightness in the chest, shoulders, arms, or jaw, nausea, shortness of breath, cold or clammy skin, feeling faint or lightheaded Heart rhythm changes--fast or irregular heartbeat, dizziness, feeling faint or lightheaded, chest pain, trouble breathing High blood sugar (hyperglycemia)--increased thirst or amount of urine, unusual weakness or fatigue, blurry vision Mood swings, irritability, hostility Seizures Stroke--sudden numbness or weakness of the face, arm, or leg, trouble speaking, confusion, trouble walking, loss of balance or coordination, dizziness, severe headache, change in vision Thoughts of suicide or self-harm, worsening mood, feelings of depression Side   effects that usually do not require medical attention (report to your care team if they continue or  are bothersome): Bone pain Change in sex drive or performance General discomfort and fatigue Hot flashes Muscle pain Pain, redness, or irritation at injection site Swelling of the ankles, hands, or feet This list may not describe all possible side effects. Call your doctor for medical advice about side effects. You may report side effects to FDA at 1-800-FDA-1088. Where should I keep my medication? This medication is given in a hospital or clinic. It will not be stored at home. NOTE: This sheet is a summary. It may not cover all possible information. If you have questions about this medicine, talk to your doctor, pharmacist, or health care provider.  2023 Elsevier/Gold Standard (2022-02-19 00:00:00)  

## 2023-04-22 ENCOUNTER — Telehealth: Payer: Self-pay | Admitting: Hematology and Oncology

## 2023-04-22 ENCOUNTER — Telehealth: Payer: Self-pay | Admitting: *Deleted

## 2023-04-22 NOTE — Telephone Encounter (Signed)
Received message from scheduler that pt has requested a call this am. TCT patient. Spoke with him. He states the pain he has been experiencing that goes from his lower back to his left leg has increased over the weekend. He is wondering if he needs to see Rad-Onc, Dr. Kathrynn Running. He is wondering what to do next. He is taking his pain meds and it helps but does return when meds wear off.  Please advise.

## 2023-04-22 NOTE — Telephone Encounter (Signed)
Oral Oncology Patient Advocate Encounter   Submitted application for assistance for Rubraca to [employer].   Application submitted via e-fax to [phone]   [employer] phone number [phone].   I will continue to check the status until final determination.   Jinger Neighbors, CPhT-Adv Oncology Pharmacy Patient Advocate [facility] Direct Number: [phone]  Fax: [phone]

## 2023-04-24 ENCOUNTER — Other Ambulatory Visit: Payer: Self-pay

## 2023-04-24 ENCOUNTER — Telehealth: Payer: Self-pay | Admitting: *Deleted

## 2023-04-24 NOTE — Telephone Encounter (Signed)
Oral Oncology Patient Advocate Encounter   Received notification that the application for assistance for Rubraca through [employer] has been approved.   [employer] phone number [phone].   Effective dates: 04/23/23 through 12/24/23  I have spoken to the patient.  Jinger Neighbors, CPhT-Adv Oncology Pharmacy Patient Advocate [facility] Direct Number: [phone]  Fax: [phone]

## 2023-04-24 NOTE — Telephone Encounter (Signed)
Received message that patient would like to know when to start his new oral chemo agent for his prostate cancer. Spoke with patient. Advised that since he has been off the Abiraterone for 2 weeks, he can go ahead and start the Rubraca as soon as he gets it in the  mail from his specialty pharmacy. Pt voiced understanding.  He also had some questions about his pain control as his pain is now everyday. Advised that I would have Nolon Bussing, [place] call him about his pain as she is the one managing his pain. Pt voiced understanding.

## 2023-04-25 ENCOUNTER — Other Ambulatory Visit: Payer: Self-pay | Admitting: *Deleted

## 2023-04-25 ENCOUNTER — Telehealth: Payer: Self-pay

## 2023-04-25 MED ORDER — ONDANSETRON HCL 8 MG PO TABS: 8.0000 mg | ORAL_TABLET | Freq: Three times a day (TID) | ORAL | 1 refills | Status: DC | PRN

## 2023-04-25 NOTE — Telephone Encounter (Signed)
Pt called to discuss new medication rubraca, and his pain. Pt reports pain is overall managed with a few rough days occasionally, per [name], NP, pt ok to take 2 mobic as needed for increased pain. Pt also informed of no drug interactions with rubraca, medication list reviewed and accurate. Pt confirmed that he received eligard injection as well. No further questions or needs at this time. Pt knows to call the office with any questions or concerns.

## 2023-04-25 NOTE — Telephone Encounter (Signed)
Oral Chemotherapy Pharmacist Encounter  Patient Education I spoke with patient for overview of new oral chemotherapy medication: Rubraca (rucaparib) for the treatment of metastatic, castration resistant, prostate cancer, BRCA2 mutated in conjunction with ADT, planned duration until disease progression or unacceptable drug toxicity.   Pt is doing well. Counseled patient on administration, dosing, side effects, monitoring, drug-food interactions, safe handling, storage, and disposal.  Patient will take Rubraca 300 mg tablets, 2 tablets (600 mg) by mouth twice daily with or without food. Patient has elected to take 1 tablet (300 mg total) by mouth twice daily for the first few days of treatment, and then dose escalate to full dose thereafter.   Start date: 04/29/23  Side effects include but not limited to: fatigue, nausea/vomiting, GI upset, taste changes, decreased WBC, decreased platelet count, decreased Hgb, increased LFTs, increased serum creatinine.    Discussed that antiemetic risk with Rubraca is moderate to high. Patient has Zofran available to take as PPX to decrease risk of N/V while on Rubraca.   Reviewed with patient importance of keeping a medication schedule and plan for any missed doses. No barriers to medication adherence identified.  Frederick Ponce voiced understanding and appreciation.   All questions answered.  Provided patient with Oral Chemotherapy Navigation Clinic phone number. Patient knows to call the office with questions or concerns. Oral Chemotherapy Navigation Clinic will continue to follow.  Lenord Carbo, PharmD, BCPS, BCOP Hematology/Oncology Clinical Pharmacist [facility] and [facility] Oral Chemotherapy Navigation Clinics [phone] 04/25/2023 3:10 PM

## 2023-04-25 NOTE — Telephone Encounter (Signed)
Oral Chemotherapy Pharmacist Encounter   Attempted to reach patient to provide update and offer for initial counseling on oral medication: Rubraca (rucaparib).  No answer.   Left voicemail for patient to call back to discuss details of medication acquisition and initial counseling session.  Lenord Carbo, PharmD, BCPS, [facility] Hematology/Oncology Clinical Pharmacist [facility] and [facility] Oral Chemotherapy Navigation Clinics [phone] 04/25/2023 12:29 PM

## 2023-05-01 ENCOUNTER — Telehealth: Payer: Self-pay | Admitting: *Deleted

## 2023-05-01 NOTE — Telephone Encounter (Signed)
Received vm message from pt stating he has started his Rubraca on 04/29/23 and now c/o sore throat, upper respiratory s/ except no fever.  TCT patient  to get additional information. Spoke with patient. He states the sore throat started last night and has progressed some during the day today. C/o nasal congestion, sore throat. Denies fever or chills. He has taken 4 does of the [place]. Advised that it not likely caused by the Rubraca. He can take Tylenol/Advil and deconsetants as needed for comfort. Advised if he was very worried he could call his PCP or Urgent to get tested for Covid, Flu and or Strep. Pt voiced understanding.

## 2023-05-03 ENCOUNTER — Other Ambulatory Visit: Payer: Self-pay | Admitting: Nurse Practitioner

## 2023-05-03 DIAGNOSIS — E785 Hyperlipidemia, unspecified: Secondary | ICD-10-CM

## 2023-05-06 ENCOUNTER — Inpatient Hospital Stay: Payer: Medicare Other | Attending: Nurse Practitioner

## 2023-05-06 ENCOUNTER — Other Ambulatory Visit: Payer: Self-pay | Admitting: Nurse Practitioner

## 2023-05-06 DIAGNOSIS — C7951 Secondary malignant neoplasm of bone: Secondary | ICD-10-CM | POA: Insufficient documentation

## 2023-05-06 DIAGNOSIS — C61 Malignant neoplasm of prostate: Secondary | ICD-10-CM

## 2023-05-06 DIAGNOSIS — C7800 Secondary malignant neoplasm of unspecified lung: Secondary | ICD-10-CM | POA: Diagnosis present

## 2023-05-06 DIAGNOSIS — Z515 Encounter for palliative care: Secondary | ICD-10-CM

## 2023-05-06 DIAGNOSIS — N390 Urinary tract infection, site not specified: Secondary | ICD-10-CM

## 2023-05-06 DIAGNOSIS — Z79899 Other long term (current) drug therapy: Secondary | ICD-10-CM | POA: Insufficient documentation

## 2023-05-06 DIAGNOSIS — Z923 Personal history of irradiation: Secondary | ICD-10-CM | POA: Diagnosis not present

## 2023-05-06 DIAGNOSIS — G893 Neoplasm related pain (acute) (chronic): Secondary | ICD-10-CM

## 2023-05-06 LAB — CMP (CANCER CENTER ONLY)
ALT: 33 U/L (ref 0–44)
AST: 39 U/L (ref 15–41)
Albumin: 4.3 g/dL (ref 3.5–5.0)
Alkaline Phosphatase: 57 U/L (ref 38–126)
Anion gap: 8 (ref 5–15)
BUN: 19 mg/dL (ref 8–23)
CO2: 26 mmol/L (ref 22–32)
Calcium: 9.1 mg/dL (ref 8.9–10.3)
Chloride: 103 mmol/L (ref 98–111)
Creatinine: 1.02 mg/dL (ref 0.61–1.24)
GFR, Estimated: >60 mL/min (ref >60)
Glucose, Bld: 100 mg/dL — ABNORMAL HIGH (ref 70–99)
Potassium: 4.6 mmol/L (ref 3.5–5.1)
Sodium: 137 mmol/L (ref 135–145)
Total Bilirubin: 1.2 mg/dL (ref 0.3–1.2)
Total Protein: 7 g/dL (ref 6.5–8.1)

## 2023-05-06 LAB — CBC WITH DIFFERENTIAL (CANCER CENTER ONLY)
Abs Immature Granulocytes: 0.01 10*3/uL (ref 0.00–0.07)
Basophils Absolute: 0 10*3/uL (ref 0.0–0.1)
Basophils Relative: 1 %
Eosinophils Absolute: 0.3 10*3/uL (ref 0.0–0.5)
Eosinophils Relative: 6 %
HCT: 36.6 % — ABNORMAL LOW (ref 39.0–52.0)
Hemoglobin: 12 g/dL — ABNORMAL LOW (ref 13.0–17.0)
Immature Granulocytes: 0 %
Lymphocytes Relative: 21 %
Lymphs Abs: 1.1 10*3/uL (ref 0.7–4.0)
MCH: 33.6 pg (ref 26.0–34.0)
MCHC: 32.8 g/dL (ref 30.0–36.0)
MCV: 102.5 fL — ABNORMAL HIGH (ref 80.0–100.0)
Monocytes Absolute: 0.4 10*3/uL (ref 0.1–1.0)
Monocytes Relative: 7 %
Neutro Abs: 3.3 10*3/uL (ref 1.7–7.7)
Neutrophils Relative %: 65 %
Platelet Count: 224 10*3/uL (ref 150–400)
RBC: 3.57 MIL/uL — ABNORMAL LOW (ref 4.22–5.81)
RDW: 12.6 % (ref 11.5–15.5)
WBC Count: 5 10*3/uL (ref 4.0–10.5)
nRBC: 0 % (ref 0.0–0.2)

## 2023-05-06 MED ORDER — MORPHINE SULFATE ER 15 MG PO TBCR
15.0000 mg | EXTENDED_RELEASE_TABLET | Freq: Three times a day (TID) | ORAL | 0 refills | Status: DC
Start: 2023-05-06 — End: 2023-06-05

## 2023-05-07 LAB — PROSTATE-SPECIFIC AG, SERUM (LABCORP): Prostate Specific Ag, Serum: 16.5 ng/mL — ABNORMAL HIGH (ref 0.0–4.0)

## 2023-05-07 NOTE — Progress Notes (Signed)
[facility] [phone]   [phone]  PROGRESS NOTE  Patient Care Team: Arnette Felts, FNP as PCP - General (General Practice) Pickenpack-Cousar, Arty Baumgartner, NP as Nurse Practitioner (Nurse Practitioner) Alisa Graff, Pascal Lux, LCSW as Social Worker (Licensed Clinical Social Worker)  Hematological/Oncological History # Metastatic castrate sensitive prostate cancer. 01/15/22: CT-guided biopsy of a pulmonary nodule confirmed the presence of prostate cancer 01/23/2022:  Firmagon 240 mg started  Feb 2023: radiation therapy to the left sacroiliac pelvic bones completed. He received 30 Gray in 10 fractions  03/06/2022: Abiraterone 1000 mg daily with prednisone 5 mg daily started  01/03/2023: last visit with Dr. Clelia Croft. Restarted 500 mg abiraterone daily.  02/06/2023: transfer care to Dr. Leonides Schanz. Increased dose to 1000 mg abiraterone daily.  04/11/2023: Due to increasing PSA transition to olaparib 300 mg twice daily  Interval History:  Frederick Ponce 77 y.o. 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 The patient's last visit was on 04/11/2023.   On exam today Frederick Ponce reports he started his medication of Saturday last week.  He reports that he has not had any issues since starting the pills.  He reports he is taking them every 12 hours as prescribed.  He notes he does have a lot of fatigue and his energy does wipe out pretty quickly.  He notes that he tries to be an active person but he gets out of breath quite easily.  He reports he is having minimal nausea but has not required any as needed medications to help with this.  His bowels are still moving well.  He is terrified of getting diarrhea again.  He is not having any lightheadedness or dizziness though he is also having some issues with poor appetite.  His quality of life is "not as good as anticipated".. He denies  fevers, chills, sweats,chest pain or cough.  A full 10 point ROS was otherwise negative.  Previously we discussed the results of his rising PSA and need to transition therapies.  He has a BRCA mutation and therefore would qualify for rucaparib therapy.    MEDICAL HISTORY:  Past Medical History:  Diagnosis Date   Arthritis    Depression    Family history of melanoma    Family history of prostate cancer    GERD (gastroesophageal reflux disease)    Hyperlipidemia    Hypertension    Parkinson disease    Prostate cancer (HCC)     SURGICAL HISTORY: No past surgical history on file.  SOCIAL HISTORY: Social History   Socioeconomic History   Marital status: Married    Spouse name: Frederick Ponce   Number of children: 0   Years of education: Not on file   Highest education level: Associate degree: academic program  Occupational History   Not on file  Tobacco Use   Smoking status: Never   Smokeless tobacco: Never  Vaping Use   Vaping Use: Never used  Substance and Sexual Activity   Alcohol use: Yes    Alcohol/week: 15.0 standard drinks of alcohol    Types: 15 Glasses of wine per week    Comment: couple glasses a night   Drug use: Never   Sexual activity: Not Currently    Birth control/protection: None  Other Topics Concern   Not on file  Social History Narrative   Not on file   Social Determinants of Health   Financial  Resource Strain: Low Risk  (01/03/2023)   Overall Financial Resource Strain (CARDIA)    Difficulty of Paying Living Expenses: Not hard at all  Food Insecurity: No Food Insecurity (01/03/2023)   Hunger Vital Sign    Worried About Running Out of Food in the Last Year: Never true    Ran Out of Food in the Last Year: Never true  Transportation Needs: No Transportation Needs (01/03/2023)   PRAPARE - Administrator, Civil Service (Medical): No    Lack of Transportation (Non-Medical): No  Physical Activity: Inactive (01/03/2023)   Exercise Vital Sign     Days of Exercise per Week: 0 days    Minutes of Exercise per Session: 0 min  Stress: No Stress Concern Present (01/03/2023)   [employer] of Occupational Health - Occupational Stress Questionnaire    Feeling of Stress : Not at all  Social Connections: Not on file  Intimate Partner Violence: Not At Risk (10/12/2022)   Humiliation, Afraid, Rape, and Kick questionnaire    Fear of Current or Ex-Partner: No    Emotionally Abused: No    Physically Abused: No    Sexually Abused: No    FAMILY HISTORY: Family History  Problem Relation Age of Onset   Kidney disease Mother    Colon cancer Mother    Heart disease Father    Tremor Father    Parkinson's disease Father    Prostate cancer Father    Melanoma Sister    Parkinson's disease Sister    Melanoma Sister        melanoma x2   Prostate cancer Brother    Parkinson's disease Paternal Grandmother    Lung cancer Paternal Grandfather    Parkinson's disease Paternal Aunt    Lung cancer Paternal Aunt     ALLERGIES:  is allergic to gabapentin and celebrex [celecoxib].  MEDICATIONS:  Current Outpatient Medications  Medication Sig Dispense Refill   ibuprofen (ADVIL) 200 MG tablet Take 200 mg by mouth every 6 (six) hours as needed.     amLODipine (NORVASC) 5 MG tablet Take 1 tablet (5 mg total) by mouth daily. 90 tablet 1   atorvastatin (LIPITOR) 40 MG tablet TAKE ONE TABLET BY MOUTH EVERY MORNING 90 tablet 1   calcium-vitamin D (OSCAL WITH D) 500-5 MG-MCG tablet Take 1 tablet by mouth 2 (two) times daily. 60 tablet 3   carbidopa-levodopa (SINEMET) 25-100 MG tablet Take 1 tablet by mouth 4 (four) times daily. Take 1 hour before any meals. 360 tablet 1   COENZYME Q10 PO Take 1 capsule by mouth every morning.     KRILL OIL PO Take 1 capsule by mouth every morning.     meloxicam (MOBIC) 7.5 MG tablet Take 1 tablet (7.5 mg total) by mouth daily. 60 tablet 1   morphine (MS CONTIN) 15 MG 12 hr tablet Take 1 tablet (15 mg total) by mouth  every 8 (eight) hours. 90 tablet 0   multivitamin-lutein (OCUVITE-LUTEIN) CAPS capsule Take 1 capsule by mouth every morning.     ondansetron (ZOFRAN) 8 MG tablet Take 1 tablet (8 mg total) by mouth every 8 (eight) hours as needed for nausea or vomiting. 20 tablet 1   rucaparib camsylate (RUBRACA) 300 MG tablet Take 2 tablets (600 mg total) by mouth 2 (two) times daily. 601 tablet 1   vitamin B-12 (CYANOCOBALAMIN) 1000 MCG tablet Take 1,000 mcg by mouth every morning.     No current facility-administered medications for this visit.  REVIEW OF SYSTEMS:   Constitutional: ( - ) fevers, ( - )  chills , ( - ) night sweats Eyes: ( - ) blurriness of vision, ( - ) double vision, ( - ) watery eyes Ears, nose, mouth, throat, and face: ( - ) mucositis, ( - ) sore throat Respiratory: ( - ) cough, ( - ) dyspnea, ( - ) wheezes Cardiovascular: ( - ) palpitation, ( - ) chest discomfort, ( - ) lower extremity swelling Gastrointestinal:  ( - ) nausea, ( - ) heartburn, ( - ) change in bowel habits Skin: ( - ) abnormal skin rashes Lymphatics: ( - ) new lymphadenopathy, ( - ) easy bruising Neurological: ( - ) numbness, ( - ) tingling, ( - ) new weaknesses Behavioral/Psych: ( - ) mood change, ( - ) new changes  All other systems were reviewed with the patient and are negative.  PHYSICAL EXAMINATION: ECOG PERFORMANCE STATUS: 1 - Symptomatic but completely ambulatory  Vitals:   05/08/23 1050  BP: (!) 143/68  Pulse: 74  Resp: 14  Temp: 98 F (36.7 C)  SpO2: 95%     Filed Weights   05/08/23 1050  Weight: 173 lb 4.8 oz (78.6 kg)      GENERAL: Well-appearing elderly Caucasian male, alert, no distress and comfortable SKIN: skin color, texture, turgor are normal, no rashes or significant lesions EYES: conjunctiva are pink and non-injected, sclera clear LUNGS: clear to auscultation and percussion with normal breathing effort HEART: regular rate & rhythm and no murmurs and no lower extremity  edema Musculoskeletal: no cyanosis of digits and no clubbing  PSYCH: alert & oriented x 3, fluent speech NEURO: no focal motor/sensory deficits  LABORATORY DATA:  I have reviewed the data as listed    Latest Ref Rng & Units 05/06/2023   10:16 AM 04/08/2023    9:28 AM 03/01/2023   10:22 AM  CBC  WBC 4.0 - 10.5 K/uL 5.0  6.1  5.9   Hemoglobin 13.0 - 17.0 g/dL 16.1  09.6  04.5   Hematocrit 39.0 - 52.0 % 36.6  35.8  36.6   Platelets 150 - 400 K/uL 224  220  220        Latest Ref Rng & Units 05/06/2023   10:16 AM 04/08/2023    9:28 AM 03/01/2023   10:22 AM  CMP  Glucose 70 - 99 mg/dL 409  96  811   BUN 8 - 23 mg/dL 19  19  20    Creatinine 0.61 - 1.24 mg/dL 9.14  7.82  9.56   Sodium 135 - 145 mmol/L 137  139  139   Potassium 3.5 - 5.1 mmol/L 4.6  4.5  4.3   Chloride 98 - 111 mmol/L 103  106  106   CO2 22 - 32 mmol/L 26  28  27    Calcium 8.9 - 10.3 mg/dL 9.1  9.4  9.5   Total Protein 6.5 - 8.1 g/dL 7.0  6.6  7.0   Total Bilirubin 0.3 - 1.2 mg/dL 1.2  1.0  1.2   Alkaline Phos 38 - 126 U/L 57  55  46   AST 15 - 41 U/L 39  13  13   ALT 0 - 44 U/L 33  <5  5     Lab Results  Component Value Date   MPROTEIN Not Observed 12/27/2021   Lab Results  Component Value Date   KPAFRELGTCHN 16.0 12/27/2021   LAMBDASER 12.2 12/27/2021   KAPLAMBRATIO 1.31  12/27/2021     RADIOGRAPHIC STUDIES: No results found.  ASSESSMENT & PLAN Frederick Ponce 77 y.o. 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  # Metastatic castrate sensitive prostate cancer. 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   --Most recent CT scan from 02/20/2023 did show progression of lung metastases.  Will plan for new imaging next month. --continue on Rucaparib therapy -- Last dose of Eligard on 04/11/2023.  Received 30 mg injection plan for next dose to be in July 2024. --  labs today show white blood cell 5.0, hemoglobin 12.0, MCV 102.5, and platelets of 224 -- Return to clinic in 4 weeks to re-evaluate.   No orders of the defined types were placed in this encounter.   All questions were answered. The patient knows to call the clinic with any problems, questions or concerns.  A total of more than 30 minutes were spent on this encounter with face-to-face time and non-face-to-face time, including preparing to see the patient, counseling the patient and coordination of care as outlined above.   Frederick Barns, MD Department of Hematology/Oncology [facility] Phone: [phone] Pager: [phone] Email: [email or web address]   05/20/2023 6:08 PM

## 2023-05-08 ENCOUNTER — Inpatient Hospital Stay (HOSPITAL_BASED_OUTPATIENT_CLINIC_OR_DEPARTMENT_OTHER): Payer: Medicare Other | Admitting: Nurse Practitioner

## 2023-05-08 ENCOUNTER — Other Ambulatory Visit: Payer: Self-pay

## 2023-05-08 ENCOUNTER — Other Ambulatory Visit: Payer: Medicare Other

## 2023-05-08 ENCOUNTER — Encounter: Payer: Self-pay | Admitting: Nurse Practitioner

## 2023-05-08 ENCOUNTER — Inpatient Hospital Stay (HOSPITAL_BASED_OUTPATIENT_CLINIC_OR_DEPARTMENT_OTHER): Payer: Medicare Other | Admitting: Hematology and Oncology

## 2023-05-08 VITALS — BP 143/68 | HR 74 | Temp 98.0°F | Resp 14 | Wt 173.3 lb

## 2023-05-08 DIAGNOSIS — G893 Neoplasm related pain (acute) (chronic): Secondary | ICD-10-CM | POA: Diagnosis not present

## 2023-05-08 DIAGNOSIS — R530 Neoplastic (malignant) related fatigue: Secondary | ICD-10-CM | POA: Diagnosis not present

## 2023-05-08 DIAGNOSIS — C7951 Secondary malignant neoplasm of bone: Secondary | ICD-10-CM

## 2023-05-08 DIAGNOSIS — Z515 Encounter for palliative care: Secondary | ICD-10-CM

## 2023-05-08 DIAGNOSIS — C61 Malignant neoplasm of prostate: Secondary | ICD-10-CM | POA: Diagnosis not present

## 2023-05-08 DIAGNOSIS — R1013 Epigastric pain: Secondary | ICD-10-CM

## 2023-05-08 MED ORDER — MELOXICAM 7.5 MG PO TABS: 7.5000 mg | ORAL_TABLET | Freq: Every day | ORAL | 1 refills | Status: DC

## 2023-05-08 NOTE — Progress Notes (Signed)
Palliative Medicine [facility]  [phone] [phone]   Name: Frederick Ponce Date: 05/08/2023 MRN: [id number]  DOB: 12/01/1946  Patient Care Team: Arnette Felts, FNP as PCP - General (General Practice) Pickenpack-Cousar, Arty Baumgartner, NP as Nurse Practitioner (Nurse Practitioner) Duffy, Pascal Lux, LCSW as Social Worker (Licensed Clinical Social Worker)    REASON FOR CONSULTATION: Frederick Ponce is a 77 y.o. male with medical history including Parkinson's disease and hyperlipidemia. 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 He is having urgent radiation appointment due to iliac bone pain. Palliative ask to see for symptom management.    SOCIAL HISTORY:     reports that he has never smoked. He has never used smokeless tobacco. He reports current alcohol use of about 15.0 standard drinks of alcohol per week. He reports that he does not use drugs.  ADVANCE DIRECTIVES:  Patient is scheduled to complete advanced directives at our clinic here at the cancer center on 02/16/2022. MOST form provided.  Patient plans to review and complete at a later time.   CODE STATUS:   PAST MEDICAL HISTORY: Past Medical History:  Diagnosis Date   Arthritis    Depression    Family history of melanoma    Family history of prostate cancer    GERD (gastroesophageal reflux disease)    Hyperlipidemia    Hypertension    Parkinson disease    Prostate cancer (HCC)     PAST SURGICAL HISTORY: No past surgical history on file.  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  The report date is December 21, 2022.  The CancerNext-Expanded gene panel offered by [facility] and includes sequencing and rearrangement analysis for the following 77 genes: AIP, ALK, APC*, ATM*, AXIN2, BAP1, BARD1, BLM, BMPR1A, BRCA1*, BRCA2*, BRIP1*, CDC73, CDH1*, CDK4, CDKN1B, CDKN2A, CHEK2*, CTNNA1, DICER1, FANCC, FH, FLCN, GALNT12, KIF1B, LZTR1, MAX, MEN1, MET, MLH1*, MSH2*, MSH3, MSH6*, MUTYH*, NBN, NF1*, NF2, NTHL1, PALB2*, PHOX2B, PMS2*, POT1, PRKAR1A, PTCH1, PTEN*, RAD51C*, RAD51D*, RB1, RECQL, RET, SDHA, SDHAF2, SDHB, SDHC, SDHD, SMAD4, SMARCA4, SMARCB1, SMARCE1, STK11, SUFU, TMEM127, TP53*, TSC1, TSC2, VHL and XRCC2 (sequencing and deletion/duplication); EGFR, EGLN1, HOXB13, KIT, MITF, PDGFRA, POLD1, and POLE (sequencing only); EPCAM and GREM1 (deletion/duplication only). DNA and RNA analyses performed for * genes.      ALLERGIES:  is allergic to gabapentin and celebrex [celecoxib].  MEDICATIONS:  Current Outpatient Medications  Medication Sig Dispense Refill   amLODipine (NORVASC) 5 MG tablet Take 1 tablet (5 mg total) by mouth daily. 90 tablet 1   atorvastatin (LIPITOR) 40 MG tablet TAKE ONE TABLET BY MOUTH EVERY MORNING 90 tablet 1   calcium-vitamin D (OSCAL WITH D) 500-5 MG-MCG tablet Take 1 tablet by mouth 2 (two) times daily. 60 tablet 3   carbidopa-levodopa (SINEMET) 25-100 MG tablet Take 1 tablet by mouth 4 (four) times daily. Take 1 hour before any meals. 360 tablet 1   COENZYME Q10  PO Take 1 capsule by mouth every morning.     KRILL OIL PO Take 1 capsule by mouth every morning.     meloxicam (MOBIC) 7.5 MG tablet Take 1 tablet (7.5 mg total) by mouth daily as needed for pain. 15 tablet 0   morphine (MS CONTIN) 15 MG 12 hr tablet Take 1 tablet (15 mg total) by mouth  every 8 (eight) hours. 90 tablet 0   multivitamin-lutein (OCUVITE-LUTEIN) CAPS capsule Take 1 capsule by mouth every morning.     ondansetron (ZOFRAN) 8 MG tablet Take 1 tablet (8 mg total) by mouth every 8 (eight) hours as needed for nausea or vomiting. 20 tablet 1   rucaparib camsylate (RUBRACA) 300 MG tablet Take 600 mg by mouth 2 (two) times daily.     vitamin B-12 (CYANOCOBALAMIN) 1000 MCG tablet Take 1,000 mcg by mouth every morning.     No current facility-administered medications for this visit.    PERFORMANCE STATUS (ECOG) : 1 - Symptomatic but completely ambulatory  Assessing NAD, ambulatory RRR Normal breathing pattern AAO x4  IMPRESSION: Frederick Ponce presents to clinic today for symptom management follow-up. No acute distress. Husband is present. Patient is ambulatory. Denies nausea, vomiting, constipation, or diarrhea. Is tolerating oral therapy without difficulty.   Frederick Ponce states pain is well controlled. He has only been taking his meloxicam as needed however when taking noticeable difference in his discomfort. He is aware this should be taken daily.   Also shares some feelings of indigestion. Has Pepcid on hand at home however has not taken. Encouraged him to begin taking daily. Also re-enforced ability to take Tums as discussed with Frederick Ponce, Charity fundraiser.   Appetite is good. Some days better than others.   He and husband verbalized understanding of recommendations. Will continue to monitor.    PLAN: MS Contin 15 mg  three times daily  Tylenol extra strength as needed Meloxicam 5 mg daily Prune juice, tea, Dulcolax daily for bowel regimen  I will see patient back in 4-6 weeks in collaboration to other oncology appointments.   Patient expressed understanding and was in agreement with this plan. He also understands that He can call the clinic at any time with any questions, concerns, or complaints.    Any controlled substances utilized were prescribed in the context of palliative  care. PDMP has been reviewed.    Visit consisted of counseling and education dealing with the complex and emotionally intense issues of symptom management and palliative care in the setting of serious and potentially life-threatening illness.Greater than 50%  of this time was spent counseling and coordinating care related to the above assessment and plan.  Willette Alma, AGPCNP-BC  Palliative Medicine Team/[facility]  *Please note that this is a verbal dictation therefore any spelling or grammatical errors are due to the "Dragon Medical One" system interpretation.

## 2023-05-16 ENCOUNTER — Other Ambulatory Visit: Payer: Self-pay | Admitting: *Deleted

## 2023-05-16 DIAGNOSIS — C7951 Secondary malignant neoplasm of bone: Secondary | ICD-10-CM

## 2023-05-16 MED ORDER — RUCAPARIB CAMSYLATE 300 MG PO TABS
600.0000 mg | ORAL_TABLET | Freq: Two times a day (BID) | ORAL | 1 refills | Status: DC
Start: 2023-05-16 — End: 2023-06-05

## 2023-05-20 ENCOUNTER — Encounter: Payer: Self-pay | Admitting: Hematology and Oncology

## 2023-05-28 ENCOUNTER — Telehealth: Payer: Self-pay | Admitting: Hematology and Oncology

## 2023-05-28 NOTE — Telephone Encounter (Signed)
Patient is aware of reschedule appointment times/dates.

## 2023-06-03 ENCOUNTER — Inpatient Hospital Stay: Payer: Medicare Other | Attending: Nurse Practitioner

## 2023-06-03 DIAGNOSIS — C7951 Secondary malignant neoplasm of bone: Secondary | ICD-10-CM | POA: Insufficient documentation

## 2023-06-03 DIAGNOSIS — Z79899 Other long term (current) drug therapy: Secondary | ICD-10-CM | POA: Insufficient documentation

## 2023-06-03 DIAGNOSIS — C7800 Secondary malignant neoplasm of unspecified lung: Secondary | ICD-10-CM | POA: Diagnosis present

## 2023-06-03 DIAGNOSIS — Z923 Personal history of irradiation: Secondary | ICD-10-CM | POA: Diagnosis not present

## 2023-06-03 DIAGNOSIS — C61 Malignant neoplasm of prostate: Secondary | ICD-10-CM | POA: Diagnosis present

## 2023-06-03 LAB — CMP (CANCER CENTER ONLY)
ALT: 35 U/L (ref 0–44)
AST: 55 U/L — ABNORMAL HIGH (ref 15–41)
Albumin: 4.4 g/dL (ref 3.5–5.0)
Alkaline Phosphatase: 59 U/L (ref 38–126)
Anion gap: 5 (ref 5–15)
BUN: 21 mg/dL (ref 8–23)
CO2: 27 mmol/L (ref 22–32)
Calcium: 9.4 mg/dL (ref 8.9–10.3)
Chloride: 105 mmol/L (ref 98–111)
Creatinine: 1.11 mg/dL (ref 0.61–1.24)
GFR, Estimated: >60 mL/min (ref >60)
Glucose, Bld: 109 mg/dL — ABNORMAL HIGH (ref 70–99)
Potassium: 4.7 mmol/L (ref 3.5–5.1)
Sodium: 137 mmol/L (ref 135–145)
Total Bilirubin: 1.1 mg/dL (ref 0.3–1.2)
Total Protein: 6.8 g/dL (ref 6.5–8.1)

## 2023-06-03 LAB — CBC WITH DIFFERENTIAL (CANCER CENTER ONLY)
Abs Immature Granulocytes: 0 10*3/uL (ref 0.00–0.07)
Basophils Absolute: 0.1 10*3/uL (ref 0.0–0.1)
Basophils Relative: 1 %
Eosinophils Absolute: 0.1 10*3/uL (ref 0.0–0.5)
Eosinophils Relative: 4 %
HCT: 32.5 % — ABNORMAL LOW (ref 39.0–52.0)
Hemoglobin: 10.9 g/dL — ABNORMAL LOW (ref 13.0–17.0)
Immature Granulocytes: 0 %
Lymphocytes Relative: 35 %
Lymphs Abs: 1.2 10*3/uL (ref 0.7–4.0)
MCH: 33.9 pg (ref 26.0–34.0)
MCHC: 33.5 g/dL (ref 30.0–36.0)
MCV: 100.9 fL — ABNORMAL HIGH (ref 80.0–100.0)
Monocytes Absolute: 0.4 10*3/uL (ref 0.1–1.0)
Monocytes Relative: 12 %
Neutro Abs: 1.7 10*3/uL (ref 1.7–7.7)
Neutrophils Relative %: 48 %
Platelet Count: 162 10*3/uL (ref 150–400)
RBC: 3.22 MIL/uL — ABNORMAL LOW (ref 4.22–5.81)
RDW: 13.4 % (ref 11.5–15.5)
WBC Count: 3.5 10*3/uL — ABNORMAL LOW (ref 4.0–10.5)
nRBC: 0 % (ref 0.0–0.2)

## 2023-06-04 NOTE — Progress Notes (Unsigned)
Palliative Medicine [facility]  Telephone:(336) [phone] [phone]) [phone]   Name: Frederick Ponce Date: 06/04/2023 MRN: [id number]  DOB: November 28, 1946  Patient Care Team: Arnette Felts, FNP as PCP - General (General Practice) Pickenpack-Cousar, Arty Baumgartner, NP as Nurse Practitioner (Nurse Practitioner) Duffy, Pascal Lux, LCSW as Social Worker (Licensed Clinical Social Worker)    REASON FOR CONSULTATION: Claudius Waldner is a 77 y.o. male with medical history including Parkinson's disease and hyperlipidemia. 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 He is having urgent radiation appointment due to iliac bone pain. Palliative ask to see for symptom management.    SOCIAL HISTORY:     reports that he has never smoked. He has never used smokeless tobacco. He reports current alcohol use of about 15.0 standard drinks of alcohol per week. He reports that he does not use drugs.  ADVANCE DIRECTIVES:  Patient is scheduled to complete advanced directives at our clinic here at the cancer center on 02/16/2022. MOST form provided.  Patient plans to review and complete at a later time.   CODE STATUS:   PAST MEDICAL HISTORY: Past Medical History:  Diagnosis Date   Arthritis    Depression    Family history of melanoma    Family history of prostate cancer    GERD (gastroesophageal reflux disease)    Hyperlipidemia    Hypertension    Parkinson disease    Prostate cancer (HCC)     PAST SURGICAL HISTORY: No past surgical history on file.  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  The report date is December 21, 2022.  The CancerNext-Expanded gene panel offered by [facility] and includes sequencing and rearrangement analysis for the following 77 genes: AIP, ALK, APC*, ATM*, AXIN2, BAP1, BARD1, BLM, BMPR1A, BRCA1*, BRCA2*, BRIP1*, CDC73, CDH1*, CDK4, CDKN1B, CDKN2A, CHEK2*, CTNNA1, DICER1, FANCC, FH, FLCN, GALNT12, KIF1B, LZTR1, MAX, MEN1, MET, MLH1*, MSH2*, MSH3, MSH6*, MUTYH*, NBN, NF1*, NF2, NTHL1, PALB2*, PHOX2B, PMS2*, POT1, PRKAR1A, PTCH1, PTEN*, RAD51C*, RAD51D*, RB1, RECQL, RET, SDHA, SDHAF2, SDHB, SDHC, SDHD, SMAD4, SMARCA4, SMARCB1, SMARCE1, STK11, SUFU, TMEM127, TP53*, TSC1, TSC2, VHL and XRCC2 (sequencing and deletion/duplication); EGFR, EGLN1, HOXB13, KIT, MITF, PDGFRA, POLD1, and POLE (sequencing only); EPCAM and GREM1 (deletion/duplication only). DNA and RNA analyses performed for * genes.      ALLERGIES:  is allergic to gabapentin and celebrex [celecoxib].  MEDICATIONS:  Current Outpatient Medications  Medication Sig Dispense Refill   amLODipine (NORVASC) 5 MG tablet Take 1 tablet (5 mg total) by mouth daily. 90 tablet 1   atorvastatin (LIPITOR) 40 MG tablet TAKE ONE TABLET BY MOUTH EVERY MORNING 90 tablet 1   calcium-vitamin D (OSCAL WITH D) 500-5 MG-MCG tablet Take 1 tablet by mouth 2 (two) times daily. 60 tablet 3   carbidopa-levodopa (SINEMET) 25-100 MG tablet Take 1 tablet by mouth 4 (four) times daily. Take 1 hour before any meals. 360 tablet 1   COENZYME Q10  PO Take 1 capsule by mouth every morning.     ibuprofen (ADVIL) 200 MG tablet Take 200 mg by mouth every 6 (six) hours as needed.     KRILL OIL PO Take 1 capsule by mouth every morning.     meloxicam (MOBIC) 7.5 MG tablet Take 1 tablet (7.5 mg total) by mouth daily. 60 tablet 1   morphine  (MS CONTIN) 15 MG 12 hr tablet Take 1 tablet (15 mg total) by mouth every 8 (eight) hours. 90 tablet 0   multivitamin-lutein (OCUVITE-LUTEIN) CAPS capsule Take 1 capsule by mouth every morning.     ondansetron (ZOFRAN) 8 MG tablet Take 1 tablet (8 mg total) by mouth every 8 (eight) hours as needed for nausea or vomiting. 20 tablet 1   rucaparib camsylate (RUBRACA) 300 MG tablet Take 2 tablets (600 mg total) by mouth 2 (two) times daily. 601 tablet 1   vitamin B-12 (CYANOCOBALAMIN) 1000 MCG tablet Take 1,000 mcg by mouth every morning.     No current facility-administered medications for this visit.    PERFORMANCE STATUS (ECOG) : 1 - Symptomatic but completely ambulatory  Assessment NAD, ambulatory RRR Normal breathing pattern AAO x4  IMPRESSION: [name] presents to clinic with his husband for symptom management follow-up. No acute distress. He is remaining active. Reports overall he is doing well now that he has gotten symptoms better controlled. Denies nausea, vomiting, constipation, or diarrhea.   [name] reports his pain is well controlled on current regimen. Continues to have hot flashes but knows this will happen with no intervention to overcome. Will continue with MS Contin as prescribed.  Mobic daily. No changes at this time we will continue to closely monitor.  Patient knows to contact our office as needed.   PLAN: MS Contin 15 mg  three times daily  Tylenol extra strength as needed Meloxicam 7.5 mg daily Prune juice, tea, Dulcolax daily for bowel regimen  I will see patient back in 4-6 weeks in collaboration to other oncology appointments.   Patient expressed understanding and was in agreement with this plan. He also understands that He can call the clinic at any time with any questions, concerns, or complaints.    Any controlled substances utilized were prescribed in the context of palliative care. PDMP has been reviewed.    Visit consisted of counseling and education  dealing with the complex and emotionally intense issues of symptom management and palliative care in the setting of serious and potentially life-threatening illness.Greater than 50%  of this time was spent counseling and coordinating care related to the above assessment and plan.  Willette Alma, AGPCNP-BC  Palliative Medicine Team/[facility]  *Please note that this is a verbal dictation therefore any spelling or grammatical errors are due to the "Dragon Medical One" system interpretation.

## 2023-06-05 ENCOUNTER — Other Ambulatory Visit: Payer: Self-pay | Admitting: *Deleted

## 2023-06-05 ENCOUNTER — Other Ambulatory Visit: Payer: Medicare Other

## 2023-06-05 ENCOUNTER — Inpatient Hospital Stay (HOSPITAL_BASED_OUTPATIENT_CLINIC_OR_DEPARTMENT_OTHER): Payer: Medicare Other | Admitting: Nurse Practitioner

## 2023-06-05 ENCOUNTER — Other Ambulatory Visit: Payer: Self-pay

## 2023-06-05 ENCOUNTER — Inpatient Hospital Stay (HOSPITAL_BASED_OUTPATIENT_CLINIC_OR_DEPARTMENT_OTHER): Payer: Medicare Other | Admitting: Hematology and Oncology

## 2023-06-05 ENCOUNTER — Encounter: Payer: Self-pay | Admitting: Nurse Practitioner

## 2023-06-05 VITALS — BP 135/59 | HR 66 | Temp 98.4°F | Resp 16 | Wt 174.4 lb

## 2023-06-05 DIAGNOSIS — G893 Neoplasm related pain (acute) (chronic): Secondary | ICD-10-CM | POA: Diagnosis not present

## 2023-06-05 DIAGNOSIS — Z515 Encounter for palliative care: Secondary | ICD-10-CM

## 2023-06-05 DIAGNOSIS — C7951 Secondary malignant neoplasm of bone: Secondary | ICD-10-CM

## 2023-06-05 DIAGNOSIS — C61 Malignant neoplasm of prostate: Secondary | ICD-10-CM

## 2023-06-05 LAB — PROSTATE-SPECIFIC AG, SERUM (LABCORP): Prostate Specific Ag, Serum: 4.3 ng/mL — ABNORMAL HIGH (ref 0.0–4.0)

## 2023-06-05 MED ORDER — RUCAPARIB CAMSYLATE 300 MG PO TABS
600.0000 mg | ORAL_TABLET | Freq: Two times a day (BID) | ORAL | 1 refills | Status: DC
Start: 2023-06-05 — End: 2023-06-24

## 2023-06-05 MED ORDER — RUCAPARIB CAMSYLATE 300 MG PO TABS
600.0000 mg | ORAL_TABLET | Freq: Two times a day (BID) | ORAL | 1 refills | Status: DC
Start: 2023-06-05 — End: 2023-06-05

## 2023-06-05 MED ORDER — MORPHINE SULFATE ER 15 MG PO TBCR
15.0000 mg | EXTENDED_RELEASE_TABLET | Freq: Three times a day (TID) | ORAL | 0 refills | Status: DC
Start: 2023-06-05 — End: 2023-07-01

## 2023-06-05 NOTE — Progress Notes (Signed)
[facility] [phone]) [phone]   [phone]  PROGRESS NOTE  Patient Care Team: Arnette Felts, FNP as PCP - General (General Practice) Pickenpack-Cousar, Arty Baumgartner, NP as Nurse Practitioner (Nurse Practitioner) Alisa Graff, Pascal Lux, LCSW as Social Worker (Licensed Clinical Social Worker)  Hematological/Oncological History # Metastatic castrate sensitive prostate cancer. 01/15/22: CT-guided biopsy of a pulmonary nodule confirmed the presence of prostate cancer 01/23/2022:  Firmagon 240 mg started  Feb 2023: radiation therapy to the left sacroiliac pelvic bones completed. He received 30 Gray in 10 fractions  03/06/2022: Abiraterone 1000 mg daily with prednisone 5 mg daily started  01/03/2023: last visit with Dr. Clelia Croft. Restarted 500 mg abiraterone daily.  02/06/2023: transfer care to Dr. Leonides Schanz. Increased dose to 1000 mg abiraterone daily.  04/11/2023: Due to increasing PSA transition to rucaparib 600 mg twice daily  Interval History:  Frederick Ponce 77 y.o. 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 The patient's last visit was on 05/08/2023.   On exam today Frederick Ponce reports he continues to wonder if the treatment is worth the benefit.  He struggles with fatigue and shortness of breath on exertion.  He notes he is not having any nausea, vomiting, or diarrhea.  He notes that he did start taking the medication after eats when he was taking on empty stomach it was much worse.  He notes that the Zofran medication was causing him constipation therefore he stopped it.  He does continue to have hot flashes as well as waking up sweating/freezing.  He notes he is occasionally having episodes of lightheadedness and dizziness and fortunately has not fallen but has had an episode in the shower.  He reports that his appetite has been good but he did have 1 week of very poor eating.  He  does have some occasional shooting pains in his legs. He denies fevers, chills, sweats,chest pain or cough.  A full 10 point ROS was otherwise negative.   MEDICAL HISTORY:  Past Medical History:  Diagnosis Date   Arthritis    Depression    Family history of melanoma    Family history of prostate cancer    GERD (gastroesophageal reflux disease)    Hyperlipidemia    Hypertension    Parkinson disease    Prostate cancer (HCC)     SURGICAL HISTORY: No past surgical history on file.  SOCIAL HISTORY: Social History   Socioeconomic History   Marital status: Married    Spouse name: Janae Sauce   Number of children: 0   Years of education: Not on file   Highest education level: Associate degree: academic program  Occupational History   Not on file  Tobacco Use   Smoking status: Never   Smokeless tobacco: Never  Vaping Use   Vaping Use: Never used  Substance and Sexual Activity   Alcohol use: Yes    Alcohol/week: 15.0 standard drinks of alcohol    Types: 15 Glasses of wine per week    Comment: couple glasses a night   Drug use: Never   Sexual activity: Not Currently    Birth control/protection: None  Other Topics Concern   Not on file  Social History Narrative   Not on file   Social Determinants of Health   Financial Resource Strain: Low Risk  (01/03/2023)   Overall Financial Resource Strain (CARDIA)    Difficulty of Paying Living Expenses: Not hard at  all  Food Insecurity: No Food Insecurity (01/03/2023)   Hunger Vital Sign    Worried About Running Out of Food in the Last Year: Never true    Ran Out of Food in the Last Year: Never true  Transportation Needs: No Transportation Needs (01/03/2023)   PRAPARE - Administrator, Civil Service (Medical): No    Lack of Transportation (Non-Medical): No  Physical Activity: Inactive (01/03/2023)   Exercise Vital Sign    Days of Exercise per Week: 0 days    Minutes of Exercise per Session: 0 min  Stress: No Stress  Concern Present (01/03/2023)   [employer] of Occupational Health - Occupational Stress Questionnaire    Feeling of Stress : Not at all  Social Connections: Not on file  Intimate Partner Violence: Not At Risk (10/12/2022)   Humiliation, Afraid, Rape, and Kick questionnaire    Fear of Current or Ex-Partner: No    Emotionally Abused: No    Physically Abused: No    Sexually Abused: No    FAMILY HISTORY: Family History  Problem Relation Age of Onset   Kidney disease Mother    Colon cancer Mother    Heart disease Father    Tremor Father    Parkinson's disease Father    Prostate cancer Father    Melanoma Sister    Parkinson's disease Sister    Melanoma Sister        melanoma x2   Prostate cancer Brother    Parkinson's disease Paternal Grandmother    Lung cancer Paternal Grandfather    Parkinson's disease Paternal Aunt    Lung cancer Paternal Aunt     ALLERGIES:  is allergic to gabapentin and celebrex [celecoxib].  MEDICATIONS:  Current Outpatient Medications  Medication Sig Dispense Refill   acetaminophen (TYLENOL) 500 MG tablet Take 1,000 mg by mouth every 6 (six) hours as needed.     amLODipine (NORVASC) 5 MG tablet Take 1 tablet (5 mg total) by mouth daily. 90 tablet 1   atorvastatin (LIPITOR) 40 MG tablet TAKE ONE TABLET BY MOUTH EVERY MORNING 90 tablet 1   calcium-vitamin D (OSCAL WITH D) 500-5 MG-MCG tablet Take 1 tablet by mouth 2 (two) times daily. 60 tablet 3   carbidopa-levodopa (SINEMET) 25-100 MG tablet Take 1 tablet by mouth 4 (four) times daily. Take 1 hour before any meals. 360 tablet 1   COENZYME Q10 PO Take 1 capsule by mouth every morning.     ibuprofen (ADVIL) 200 MG tablet Take 200 mg by mouth every 6 (six) hours as needed.     KRILL OIL PO Take 1 capsule by mouth every morning.     meloxicam (MOBIC) 7.5 MG tablet Take 1 tablet (7.5 mg total) by mouth daily. 60 tablet 1   morphine (MS CONTIN) 15 MG 12 hr tablet Take 1 tablet (15 mg total) by mouth  every 8 (eight) hours. 90 tablet 0   multivitamin-lutein (OCUVITE-LUTEIN) CAPS capsule Take 1 capsule by mouth every morning.     ondansetron (ZOFRAN) 8 MG tablet Take 1 tablet (8 mg total) by mouth every 8 (eight) hours as needed for nausea or vomiting. 20 tablet 1   rucaparib camsylate (RUBRACA) 300 MG tablet Take 2 tablets (600 mg total) by mouth 2 (two) times daily. 60 tablet 1   vitamin B-12 (CYANOCOBALAMIN) 1000 MCG tablet Take 1,000 mcg by mouth every morning.     No current facility-administered medications for this visit.    REVIEW OF SYSTEMS:  Constitutional: ( - ) fevers, ( - )  chills , ( - ) night sweats Eyes: ( - ) blurriness of vision, ( - ) double vision, ( - ) watery eyes Ears, nose, mouth, throat, and face: ( - ) mucositis, ( - ) sore throat Respiratory: ( - ) cough, ( - ) dyspnea, ( - ) wheezes Cardiovascular: ( - ) palpitation, ( - ) chest discomfort, ( - ) lower extremity swelling Gastrointestinal:  ( - ) nausea, ( - ) heartburn, ( - ) change in bowel habits Skin: ( - ) abnormal skin rashes Lymphatics: ( - ) new lymphadenopathy, ( - ) easy bruising Neurological: ( - ) numbness, ( - ) tingling, ( - ) new weaknesses Behavioral/Psych: ( - ) mood change, ( - ) new changes  All other systems were reviewed with the patient and are negative.  PHYSICAL EXAMINATION: ECOG PERFORMANCE STATUS: 1 - Symptomatic but completely ambulatory  Vitals:   06/05/23 1032  BP: (!) 135/59  Pulse: 66  Resp: 16  Temp: 98.4 F (36.9 C)  SpO2: 99%      Filed Weights   06/05/23 1032  Weight: 174 lb 6.4 oz (79.1 kg)       GENERAL: Well-appearing elderly Caucasian male, alert, no distress and comfortable SKIN: skin color, texture, turgor are normal, no rashes or significant lesions EYES: conjunctiva are pink and non-injected, sclera clear LUNGS: clear to auscultation and percussion with normal breathing effort HEART: regular rate & rhythm and no murmurs and no lower extremity  edema Musculoskeletal: no cyanosis of digits and no clubbing  PSYCH: alert & oriented x 3, fluent speech NEURO: no focal motor/sensory deficits  LABORATORY DATA:  I have reviewed the data as listed    Latest Ref Rng & Units 06/03/2023    9:29 AM 05/06/2023   10:16 AM 04/08/2023    9:28 AM  CBC  WBC 4.0 - 10.5 K/uL 3.5  5.0  6.1   Hemoglobin 13.0 - 17.0 g/dL 40.9  81.1  91.4   Hematocrit 39.0 - 52.0 % 32.5  36.6  35.8   Platelets 150 - 400 K/uL 162  224  220        Latest Ref Rng & Units 06/03/2023    9:29 AM 05/06/2023   10:16 AM 04/08/2023    9:28 AM  CMP  Glucose 70 - 99 mg/dL 782  956  96   BUN 8 - 23 mg/dL 21  19  19    Creatinine 0.61 - 1.24 mg/dL 2.13  0.86  5.78   Sodium 135 - 145 mmol/L 137  137  139   Potassium 3.5 - 5.1 mmol/L 4.7  4.6  4.5   Chloride 98 - 111 mmol/L 105  103  106   CO2 22 - 32 mmol/L 27  26  28    Calcium 8.9 - 10.3 mg/dL 9.4  9.1  9.4   Total Protein 6.5 - 8.1 g/dL 6.8  7.0  6.6   Total Bilirubin 0.3 - 1.2 mg/dL 1.1  1.2  1.0   Alkaline Phos 38 - 126 U/L 59  57  55   AST 15 - 41 U/L 55  39  13   ALT 0 - 44 U/L 35  33  <5     Lab Results  Component Value Date   MPROTEIN Not Observed 12/27/2021   Lab Results  Component Value Date   KPAFRELGTCHN 16.0 12/27/2021   LAMBDASER 12.2 12/27/2021   KAPLAMBRATIO 1.31 12/27/2021  RADIOGRAPHIC STUDIES: No results found.  ASSESSMENT & PLAN Frederick Ponce 77 y.o. 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  # Metastatic castrate sensitive prostate cancer. 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  Dropped to 4.3 on 06/03/2023.  --Most recent CT scan from 02/20/2023 did show progression of lung metastases.  Will plan for new imaging next month. --continue on Rucaparib therapy -- Last dose of Eligard on 04/11/2023.  Received 30 mg injection plan for  next dose to be in July 2024. -- labs today show white blood cell 3.5, hemoglobin 10.9, MCV 100.9, and platelets of 162. Cr 1.11, AST 55, ALT 35 -- Return to clinic in 4 weeks to re-evaluate.   No orders of the defined types were placed in this encounter.   All questions were answered. The patient knows to call the clinic with any problems, questions or concerns.  A total of more than 30 minutes were spent on this encounter with face-to-face time and non-face-to-face time, including preparing to see the patient, counseling the patient and coordination of care as outlined above.   Frederick Barns, MD Department of Hematology/Oncology [facility] Phone: [phone] Pager: [phone] Email: [email or web address]   06/05/2023 1:37 PM

## 2023-06-06 ENCOUNTER — Telehealth: Payer: Self-pay | Admitting: Physician Assistant

## 2023-06-06 ENCOUNTER — Encounter: Payer: Self-pay | Admitting: General Practice

## 2023-06-06 NOTE — Progress Notes (Signed)
[facility] Spiritual Care Note  Met with Frederick Ponce to make space for him to process updates on his physical/treatment situation and relationship to quality of life. Due to fatigue and pain, many of his regular meaning-making tasks are taking much longer (with more frequent breaks) than they used to, which compromises his quality of life. Now that he has shared these updates, we plan to follow up next week to strategize in more detail about how to cope well. Scheduled appointment in Spiritual Care office for Thursday, June 20 at [facility] Frederick Ponce, [facility] Pager [phone] Voicemail [phone]

## 2023-06-07 ENCOUNTER — Telehealth: Payer: Self-pay | Admitting: Hematology and Oncology

## 2023-06-13 ENCOUNTER — Encounter: Payer: Self-pay | Admitting: General Practice

## 2023-06-13 NOTE — Progress Notes (Signed)
[facility] Spiritual Care Note  Followed up with Frederick Ponce (on his birthday!) in Spiritual Care office to provide space for him to process his understanding of/questions about/discernment regarding a cost-benefit analysis of taking vs not taking treatment for prostate cancer, parkinson's disease, and other issues. He continues to find meaning in the [facility] and feels stymied by the lack of answers to his questions about side effects vs benefits of various medications. Frederick Hisle plans to do some more discernment about these questions and how he understands purpose and quality of life ("I want to live!") and then to contact chaplain for a follow-up conversation.   [address] [place], [place], [facility] Pager [phone] Voicemail [phone]

## 2023-06-14 ENCOUNTER — Telehealth: Payer: Self-pay

## 2023-06-14 NOTE — Telephone Encounter (Signed)
Pt called reporting that he mixed up his norvasc and his mobic. Norvasc ordered to take one pill daily and mobic was ordered to take one pill daily,however pt was not taking mobic everyday. Because of the confusion of medications pt took mobic daily but missed his norvasc for several days. Pt reported one instance of dizziness but otherwise felt fine. Pt corrected his medications this morning and checked his blood pressure at home was was reading "127 over something" per pt. No further concerns pt made aware and agreeable to call with any questions or concerns.

## 2023-06-24 ENCOUNTER — Other Ambulatory Visit: Payer: Self-pay | Admitting: *Deleted

## 2023-06-24 ENCOUNTER — Telehealth: Payer: Self-pay | Admitting: *Deleted

## 2023-06-24 DIAGNOSIS — C61 Malignant neoplasm of prostate: Secondary | ICD-10-CM

## 2023-06-24 MED ORDER — RUCAPARIB CAMSYLATE 300 MG PO TABS
600.0000 mg | ORAL_TABLET | Freq: Two times a day (BID) | ORAL | 1 refills | Status: DC
Start: 2023-06-24 — End: 2023-08-20

## 2023-06-24 NOTE — Telephone Encounter (Signed)
Received call from pt to refill his Rubraca. Advised that this was done to [facility].  Pt voiced understanding

## 2023-06-28 NOTE — Progress Notes (Unsigned)
Palliative Medicine [facility]  [phone] [phone]   Name: Frederick Ponce Date: 06/28/2023 MRN: [id number]  DOB: [birth year]  Patient Care Team: Arnette Felts, FNP as PCP - General (General Practice) Pickenpack-Cousar, Arty Baumgartner, NP as Nurse Practitioner (Nurse Practitioner) Duffy, Pascal Lux, LCSW as Social Worker (Licensed Clinical Social Worker)    REASON FOR CONSULTATION: Frederick Ponce is a 77 y.o. male with medical history including Parkinson's disease and hyperlipidemia. 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 He is having urgent radiation appointment due to iliac bone pain. Palliative ask to see for symptom management.    SOCIAL HISTORY:     reports that he has never smoked. He has never used smokeless tobacco. He reports current alcohol use of about 15.0 standard drinks of alcohol per week. He reports that he does not use drugs.  ADVANCE DIRECTIVES:  Patient is scheduled to complete advanced directives at our clinic here at the cancer center on 02/16/2022. MOST form provided.  Patient plans to review and complete at a later time.   CODE STATUS:   PAST MEDICAL HISTORY: Past Medical History:  Diagnosis Date   Arthritis    Depression    Family history of melanoma    Family history of prostate cancer    GERD (gastroesophageal reflux disease)    Hyperlipidemia    Hypertension    Parkinson disease    Prostate cancer (HCC)     PAST SURGICAL HISTORY: No past surgical history on file.  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  The report date is December 21, 2022.  The CancerNext-Expanded gene panel offered by [facility] and includes sequencing and rearrangement analysis for the following 77 genes: AIP, ALK, APC*, ATM*, AXIN2, BAP1, BARD1, BLM, BMPR1A, BRCA1*, BRCA2*, BRIP1*, CDC73, CDH1*, CDK4, CDKN1B, CDKN2A, CHEK2*, CTNNA1, DICER1, FANCC, FH, FLCN, GALNT12, KIF1B, LZTR1, MAX, MEN1, MET, MLH1*, MSH2*, MSH3, MSH6*, MUTYH*, NBN, NF1*, NF2, NTHL1, PALB2*, PHOX2B, PMS2*, POT1, PRKAR1A, PTCH1, PTEN*, RAD51C*, RAD51D*, RB1, RECQL, RET, SDHA, SDHAF2, SDHB, SDHC, SDHD, SMAD4, SMARCA4, SMARCB1, SMARCE1, STK11, SUFU, TMEM127, TP53*, TSC1, TSC2, VHL and XRCC2 (sequencing and deletion/duplication); EGFR, EGLN1, HOXB13, KIT, MITF, PDGFRA, POLD1, and POLE (sequencing only); EPCAM and GREM1 (deletion/duplication only). DNA and RNA analyses performed for * genes.      ALLERGIES:  is allergic to gabapentin and celebrex [celecoxib].  MEDICATIONS:  Current Outpatient Medications  Medication Sig Dispense Refill   acetaminophen (TYLENOL) 500 MG tablet Take 1,000 mg by mouth every 6 (six) hours as needed.     amLODipine (NORVASC) 5 MG tablet Take 1 tablet (5 mg total) by mouth daily. 90 tablet 1   atorvastatin (LIPITOR) 40 MG tablet TAKE ONE TABLET BY MOUTH EVERY MORNING 90 tablet 1   calcium-vitamin D (OSCAL WITH D) 500-5 MG-MCG tablet Take 1 tablet by mouth 2 (two) times daily. 60 tablet 3   carbidopa-levodopa (SINEMET) 25-100 MG tablet Take 1  tablet by mouth 4 (four) times daily. Take 1 hour before any meals. 360 tablet 1   COENZYME Q10 PO Take 1 capsule by mouth every morning.     ibuprofen (ADVIL) 200 MG tablet Take 200 mg by mouth every 6 (six) hours as needed.     KRILL OIL PO Take 1 capsule by mouth every morning.     meloxicam  (MOBIC) 7.5 MG tablet Take 1 tablet (7.5 mg total) by mouth daily. 60 tablet 1   morphine (MS CONTIN) 15 MG 12 hr tablet Take 1 tablet (15 mg total) by mouth every 8 (eight) hours. 90 tablet 0   multivitamin-lutein (OCUVITE-LUTEIN) CAPS capsule Take 1 capsule by mouth every morning.     ondansetron (ZOFRAN) 8 MG tablet Take 1 tablet (8 mg total) by mouth every 8 (eight) hours as needed for nausea or vomiting. 20 tablet 1   rucaparib camsylate (RUBRACA) 300 MG tablet Take 2 tablets (600 mg total) by mouth 2 (two) times daily. 120 tablet 1   vitamin B-12 (CYANOCOBALAMIN) 1000 MCG tablet Take 1,000 mcg by mouth every morning.     No current facility-administered medications for this visit.    PERFORMANCE STATUS (ECOG) : 1 - Symptomatic but completely ambulatory  Assessment NAD, ambulatory RRR Normal breathing pattern AAO x4  IMPRESSION: [name] presents to clinic today for symptom management follow-up. No acute distress. Endorses ongoing fatigue and pain. Appetite is good. He has not been able to be as active as he likes due to ongoing fatigue. Reports having to take a break within 30 min of starting an activity.   [name] speaks to inability to enjoy some of the things he once did such as mowing the grass. [name] shares due to his weakness and fatigue [name] no longer reads much. [name] speaks to his decrease in quality of life. Emotional support provided.   He also reports ongoing pain which is no longer controlled with current regimen. States pain is much more constant and irritating. This is interrupting his sleep and daily living. Some days his legs feel as though they have "lead" in them and are heavy, achy, and constant discomfort.   We discussed his current regiment which consist of MS Contin 15 mg every 8 hours and Tylenol as needed. He has increased dose of MS Contin however had not notice any difference or relief in his pain. We discuss use of Fentanyl patch as [name] and patient are  concerned with amount of oral medications he is taking in addition to limited to no response to his oral regimen at this time. Education provided on use of Fentanyl patch including potential side effects, onset of efficacy, and usage instructions. Patient and husband verbalized understanding. We discussed transition phase from MS Contin to patch.   He understands we will continue to closely monitory an adjust as needed.   All questions answered and support provided.    PLAN: Discontinue use of MS Contin 15 mg  three times daily once Fentanyl patches received and applied 24 hours Fentanyl 12.5 mcg patch. Given total dose of daily morphine (MME) 45mg /24 hours equivalency fentanyl per 24 hours 22 mcg. Will offer 25% dose reduction for cross tolerance (17 mcg) will start at 12.5 mcg. Will monitor closely and titrate as needed. Patient and husband knows to call office if any concerns obtaining medication.  Tylenol extra strength as needed Meloxicam 7.5 mg daily Prune juice, tea, Dulcolax daily for bowel regimen  I will see patient back  in 4-6 weeks in collaboration to other oncology appointments. Will follow up on Friday to see how he tolerates patch and assess pain levels.   Patient expressed understanding and was in agreement with this plan. He also understands that He can call the clinic at any time with any questions, concerns, or complaints.    Any controlled substances utilized were prescribed in the context of palliative care. PDMP has been reviewed.    Visit consisted of counseling and education dealing with the complex and emotionally intense issues of symptom management and palliative care in the setting of serious and potentially life-threatening illness.Greater than 50%  of this time was spent counseling and coordinating care related to the above assessment and plan.  Willette Alma, AGPCNP-BC  Palliative Medicine Team/[facility]  *Please note that this is a  verbal dictation therefore any spelling or grammatical errors are due to the "Dragon Medical One" system interpretation.

## 2023-07-01 ENCOUNTER — Inpatient Hospital Stay (HOSPITAL_BASED_OUTPATIENT_CLINIC_OR_DEPARTMENT_OTHER): Payer: Medicare Other | Admitting: Nurse Practitioner

## 2023-07-01 ENCOUNTER — Encounter: Payer: Medicare Other | Admitting: Nurse Practitioner

## 2023-07-01 ENCOUNTER — Other Ambulatory Visit: Payer: Self-pay

## 2023-07-01 ENCOUNTER — Encounter: Payer: Self-pay | Admitting: Nurse Practitioner

## 2023-07-01 ENCOUNTER — Inpatient Hospital Stay: Payer: Medicare Other | Attending: Nurse Practitioner

## 2023-07-01 ENCOUNTER — Telehealth: Payer: Self-pay | Admitting: Neurology

## 2023-07-01 VITALS — BP 132/51 | HR 64 | Temp 98.3°F | Resp 18

## 2023-07-01 DIAGNOSIS — C7951 Secondary malignant neoplasm of bone: Secondary | ICD-10-CM | POA: Diagnosis present

## 2023-07-01 DIAGNOSIS — D649 Anemia, unspecified: Secondary | ICD-10-CM | POA: Diagnosis not present

## 2023-07-01 DIAGNOSIS — Z79899 Other long term (current) drug therapy: Secondary | ICD-10-CM | POA: Insufficient documentation

## 2023-07-01 DIAGNOSIS — Z923 Personal history of irradiation: Secondary | ICD-10-CM | POA: Insufficient documentation

## 2023-07-01 DIAGNOSIS — Z515 Encounter for palliative care: Secondary | ICD-10-CM | POA: Diagnosis not present

## 2023-07-01 DIAGNOSIS — R530 Neoplastic (malignant) related fatigue: Secondary | ICD-10-CM | POA: Diagnosis not present

## 2023-07-01 DIAGNOSIS — G893 Neoplasm related pain (acute) (chronic): Secondary | ICD-10-CM

## 2023-07-01 DIAGNOSIS — C61 Malignant neoplasm of prostate: Secondary | ICD-10-CM | POA: Diagnosis present

## 2023-07-01 DIAGNOSIS — C7800 Secondary malignant neoplasm of unspecified lung: Secondary | ICD-10-CM | POA: Diagnosis not present

## 2023-07-01 DIAGNOSIS — Z5111 Encounter for antineoplastic chemotherapy: Secondary | ICD-10-CM | POA: Insufficient documentation

## 2023-07-01 LAB — CBC WITH DIFFERENTIAL (CANCER CENTER ONLY)
Abs Immature Granulocytes: 0.01 10*3/uL (ref 0.00–0.07)
Basophils Absolute: 0 10*3/uL (ref 0.0–0.1)
Basophils Relative: 2 %
Eosinophils Absolute: 0 10*3/uL (ref 0.0–0.5)
Eosinophils Relative: 3 %
HCT: 25.3 % — ABNORMAL LOW (ref 39.0–52.0)
Hemoglobin: 8.6 g/dL — ABNORMAL LOW (ref 13.0–17.0)
Immature Granulocytes: 1 %
Lymphocytes Relative: 49 %
Lymphs Abs: 0.8 10*3/uL (ref 0.7–4.0)
MCH: 34.4 pg — ABNORMAL HIGH (ref 26.0–34.0)
MCHC: 34 g/dL (ref 30.0–36.0)
MCV: 101.2 fL — ABNORMAL HIGH (ref 80.0–100.0)
Monocytes Absolute: 0.2 10*3/uL (ref 0.1–1.0)
Monocytes Relative: 15 %
Neutro Abs: 0.5 10*3/uL — ABNORMAL LOW (ref 1.7–7.7)
Neutrophils Relative %: 30 %
Platelet Count: 154 10*3/uL (ref 150–400)
RBC: 2.5 MIL/uL — ABNORMAL LOW (ref 4.22–5.81)
RDW: 13.9 % (ref 11.5–15.5)
WBC Count: 1.5 10*3/uL — ABNORMAL LOW (ref 4.0–10.5)
nRBC: 0 % (ref 0.0–0.2)

## 2023-07-01 LAB — CMP (CANCER CENTER ONLY)
ALT: 32 U/L (ref 0–44)
AST: 57 U/L — ABNORMAL HIGH (ref 15–41)
Albumin: 4 g/dL (ref 3.5–5.0)
Alkaline Phosphatase: 62 U/L (ref 38–126)
Anion gap: 6 (ref 5–15)
BUN: 26 mg/dL — ABNORMAL HIGH (ref 8–23)
CO2: 25 mmol/L (ref 22–32)
Calcium: 9.3 mg/dL (ref 8.9–10.3)
Chloride: 106 mmol/L (ref 98–111)
Creatinine: 1.18 mg/dL (ref 0.61–1.24)
GFR, Estimated: >60 mL/min (ref >60)
Glucose, Bld: 97 mg/dL (ref 70–99)
Potassium: 5 mmol/L (ref 3.5–5.1)
Sodium: 137 mmol/L (ref 135–145)
Total Bilirubin: 1.1 mg/dL (ref 0.3–1.2)
Total Protein: 6.5 g/dL (ref 6.5–8.1)

## 2023-07-01 MED ORDER — FENTANYL 12 MCG/HR TD PT72
1.0000 | MEDICATED_PATCH | TRANSDERMAL | 0 refills | Status: DC
Start: 2023-07-01 — End: 2023-08-08

## 2023-07-01 NOTE — Telephone Encounter (Signed)
Returned call to pt and they stated  that the carbidopa sinimet 25/100mg  1 tab qid has stopped being working to the fullest potential since he started  taking Robraca (for prostate cancer. Yesterday he mentioned that he took it 11:30 am and at 3pm he started shaking. And he feels like that is due to that medication. Today he said he took it at 11:30am and he is shaking today and that's what prompted this call. He feels that it is becoming more noticeable.

## 2023-07-01 NOTE — Telephone Encounter (Signed)
Pt states he has been told the carbidopa-levodopa (SINEMET) 25-100 MG tablet ,is supposed to last him up to 6 hours.  Pt states within the last 2-3 weeks he has noticed that it only last for half of the expected time,pt would like a call to discuss the 25mg  dose

## 2023-07-02 LAB — PROSTATE-SPECIFIC AG, SERUM (LABCORP): Prostate Specific Ag, Serum: 1.4 ng/mL (ref 0.0–4.0)

## 2023-07-02 MED ORDER — CARBIDOPA-LEVODOPA 25-100 MG PO TABS: 1.0000 | ORAL_TABLET | Freq: Every day | ORAL | 1 refills | Status: DC

## 2023-07-02 NOTE — Telephone Encounter (Signed)
Please call the patient, I did not see any potential interaction with Sinemet + Rubraca.  You can check to make sure he is not taking the medications together, if so will space them out.  Ideally Sinemet is best taken an hour before or after a meal.  Is not absorbed well when taken with high-protein food.  If Parkinson's is worsening may consider adding in a 5th tablet (Sinemet 25/100 mg 1 tablet 5 times daily). Thanks

## 2023-07-02 NOTE — Telephone Encounter (Signed)
Called and spoke to pt and relayed sarah's recommendations: Pt stated that he is agreeable to adding 5th dose of sinimet.  Pt stated that he understands robraca is to be spaced out from sinemet.   Routing to sarah to make aware. Updated the rx to say 5 times a day

## 2023-07-02 NOTE — Telephone Encounter (Signed)
Meds ordered this encounter  Medications   carbidopa-levodopa (SINEMET) 25-100 MG tablet    Sig: Take 1 tablet by mouth 5 (five) times daily.    Dispense:  450 tablet    Refill:  1

## 2023-07-02 NOTE — Addendum Note (Signed)
Addended by: Glean Salvo on: 07/02/2023 10:13 AM   Modules accepted: Orders

## 2023-07-02 NOTE — Addendum Note (Signed)
Addended by: Danne Harbor on: 07/02/2023 09:40 AM   Modules accepted: Orders

## 2023-07-03 ENCOUNTER — Other Ambulatory Visit: Payer: Self-pay

## 2023-07-03 ENCOUNTER — Inpatient Hospital Stay: Payer: Medicare Other

## 2023-07-03 ENCOUNTER — Inpatient Hospital Stay (HOSPITAL_BASED_OUTPATIENT_CLINIC_OR_DEPARTMENT_OTHER): Payer: Medicare Other | Admitting: Physician Assistant

## 2023-07-03 ENCOUNTER — Inpatient Hospital Stay: Payer: Medicare Other | Admitting: Physician Assistant

## 2023-07-03 VITALS — BP 125/66 | HR 80 | Temp 98.1°F | Resp 20 | Wt 174.9 lb

## 2023-07-03 DIAGNOSIS — D6481 Anemia due to antineoplastic chemotherapy: Secondary | ICD-10-CM

## 2023-07-03 DIAGNOSIS — D702 Other drug-induced agranulocytosis: Secondary | ICD-10-CM | POA: Diagnosis not present

## 2023-07-03 DIAGNOSIS — C61 Malignant neoplasm of prostate: Secondary | ICD-10-CM

## 2023-07-03 DIAGNOSIS — C7951 Secondary malignant neoplasm of bone: Secondary | ICD-10-CM

## 2023-07-03 DIAGNOSIS — T451X5A Adverse effect of antineoplastic and immunosuppressive drugs, initial encounter: Secondary | ICD-10-CM

## 2023-07-03 DIAGNOSIS — Z5111 Encounter for antineoplastic chemotherapy: Secondary | ICD-10-CM | POA: Diagnosis not present

## 2023-07-03 MED ORDER — DENOSUMAB 120 MG/1.7ML ~~LOC~~ SOLN
120.0000 mg | Freq: Once | SUBCUTANEOUS | Status: AC
  Administered 2023-07-03: 120 mg via SUBCUTANEOUS
  Filled 2023-07-03: qty 1.7

## 2023-07-03 MED ORDER — LEUPROLIDE ACETATE (4 MONTH) 30 MG ~~LOC~~ KIT: 30.0000 mg | PACK | SUBCUTANEOUS | Status: DC

## 2023-07-03 NOTE — Progress Notes (Signed)
[facility] [phone]   [phone]  PROGRESS NOTE  Patient Care Team: Arnette Felts, FNP as PCP - General (General Practice) Pickenpack-Cousar, Arty Baumgartner, NP as Nurse Practitioner (Nurse Practitioner) Alisa Graff, Pascal Lux, LCSW as Social Worker (Licensed Clinical Social Worker)  Hematological/Oncological History # Metastatic castrate sensitive prostate cancer. 01/15/22: CT-guided biopsy of a pulmonary nodule confirmed the presence of prostate cancer 01/23/2022:  Firmagon 240 mg started  Feb 2023: radiation therapy to the left sacroiliac pelvic bones completed. He received 30 Gray in 10 fractions  03/06/2022: Abiraterone 1000 mg daily with prednisone 5 mg daily started  01/03/2023: last visit with Dr. Clelia Croft. Restarted 500 mg abiraterone daily.  02/06/2023: transfer care to Dr. Leonides Schanz. Increased dose to 1000 mg abiraterone daily.  04/11/2023: Due to increasing PSA transition to rucaparib 600 mg twice daily 07/03/2023: Held Recuaparib due to neutropenia and anemia.   Interval History:  Frederick Ponce 77 y.o. 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 The patient's last visit was on 06/05/2023.   On exam today Frederick Ponce reports his energy levels have declined over the last few weeks. He reports feeling easily cold but then has hot flashes. He reports his changes in body temperature causes insomnia. He reports occasional dizziness without any syncopal episodes. He denies any bruising or bleeding. He denies nausea, vomiting or bowel habit changes. His diffuse pain was not well controlled so he was recently prescribed fentanyl patches by palliative care earlier this week.He denies fevers, chills, sweats,chest pain or cough.  A full 10 point ROS was otherwise negative.   MEDICAL HISTORY:  Past Medical History:  Diagnosis Date   Arthritis    Depression    Family  history of melanoma    Family history of prostate cancer    GERD (gastroesophageal reflux disease)    Hyperlipidemia    Hypertension    Parkinson disease    Prostate cancer (HCC)     SURGICAL HISTORY: No past surgical history on file.  SOCIAL HISTORY: Social History   Socioeconomic History   Marital status: Married    Spouse name: Janae Sauce   Number of children: 0   Years of education: Not on file   Highest education level: Associate degree: academic program  Occupational History   Not on file  Tobacco Use   Smoking status: Never   Smokeless tobacco: Never  Vaping Use   Vaping Use: Never used  Substance and Sexual Activity   Alcohol use: Yes    Alcohol/week: 15.0 standard drinks of alcohol    Types: 15 Glasses of wine per week    Comment: couple glasses a night   Drug use: Never   Sexual activity: Not Currently    Birth control/protection: None  Other Topics Concern   Not on file  Social History Narrative   Not on file   Social Determinants of Health   Financial Resource Strain: Low Risk  (01/03/2023)   Overall Financial Resource Strain (CARDIA)    Difficulty of Paying Living Expenses: Not hard at all  Food Insecurity: No Food Insecurity (01/03/2023)   Hunger Vital Sign    Worried About Running Out of Food in the Last Year: Never true    Ran Out of Food in the Last Year: Never true  Transportation Needs: No Transportation Needs (01/03/2023)   PRAPARE - Administrator, Civil Service (Medical): No  Lack of Transportation (Non-Medical): No  Physical Activity: Inactive (01/03/2023)   Exercise Vital Sign    Days of Exercise per Week: 0 days    Minutes of Exercise per Session: 0 min  Stress: No Stress Concern Present (01/03/2023)   [employer] of Occupational Health - Occupational Stress Questionnaire    Feeling of Stress : Not at all  Social Connections: Not on file  Intimate Partner Violence: Not At Risk (10/12/2022)   Humiliation, Afraid,  Rape, and Kick questionnaire    Fear of Current or Ex-Partner: No    Emotionally Abused: No    Physically Abused: No    Sexually Abused: No    FAMILY HISTORY: Family History  Problem Relation Age of Onset   Kidney disease Mother    Colon cancer Mother    Heart disease Father    Tremor Father    Parkinson's disease Father    Prostate cancer Father    Melanoma Sister    Parkinson's disease Sister    Melanoma Sister        melanoma x2   Prostate cancer Brother    Parkinson's disease Paternal Grandmother    Lung cancer Paternal Grandfather    Parkinson's disease Paternal Aunt    Lung cancer Paternal Aunt     ALLERGIES:  is allergic to gabapentin and celebrex [celecoxib].  MEDICATIONS:  Current Outpatient Medications  Medication Sig Dispense Refill   acetaminophen (TYLENOL) 500 MG tablet Take 1,000 mg by mouth every 6 (six) hours as needed.     amLODipine (NORVASC) 5 MG tablet Take 1 tablet (5 mg total) by mouth daily. 90 tablet 1   atorvastatin (LIPITOR) 40 MG tablet TAKE ONE TABLET BY MOUTH EVERY MORNING 90 tablet 1   calcium-vitamin D (OSCAL WITH D) 500-5 MG-MCG tablet Take 1 tablet by mouth 2 (two) times daily. 60 tablet 3   carbidopa-levodopa (SINEMET) 25-100 MG tablet Take 1 tablet by mouth 5 (five) times daily. 450 tablet 1   COENZYME Q10 PO Take 1 capsule by mouth every morning.     fentaNYL (DURAGESIC) 12 MCG/HR Place 1 patch onto the skin every 3 (three) days. 5 patch 0   ibuprofen (ADVIL) 200 MG tablet Take 200 mg by mouth every 6 (six) hours as needed.     KRILL OIL PO Take 1 capsule by mouth every morning.     meloxicam (MOBIC) 7.5 MG tablet Take 1 tablet (7.5 mg total) by mouth daily. 60 tablet 1   multivitamin-lutein (OCUVITE-LUTEIN) CAPS capsule Take 1 capsule by mouth every morning.     ondansetron (ZOFRAN) 8 MG tablet Take 1 tablet (8 mg total) by mouth every 8 (eight) hours as needed for nausea or vomiting. 20 tablet 1   rucaparib camsylate (RUBRACA) 300 MG  tablet Take 2 tablets (600 mg total) by mouth 2 (two) times daily. 120 tablet 1   vitamin B-12 (CYANOCOBALAMIN) 1000 MCG tablet Take 1,000 mcg by mouth every morning.     No current facility-administered medications for this visit.    REVIEW OF SYSTEMS:   Constitutional: ( - ) fevers, ( - )  chills , ( - ) night sweats Eyes: ( - ) blurriness of vision, ( - ) double vision, ( - ) watery eyes Ears, nose, mouth, throat, and face: ( - ) mucositis, ( - ) sore throat Respiratory: ( - ) cough, ( - ) dyspnea, ( - ) wheezes Cardiovascular: ( - ) palpitation, ( - ) chest discomfort, ( - )  lower extremity swelling Gastrointestinal:  ( - ) nausea, ( - ) heartburn, ( - ) change in bowel habits Skin: ( - ) abnormal skin rashes Lymphatics: ( - ) new lymphadenopathy, ( - ) easy bruising Neurological: ( - ) numbness, ( - ) tingling, ( - ) new weaknesses Behavioral/Psych: ( - ) mood change, ( - ) new changes  All other systems were reviewed with the patient and are negative.  PHYSICAL EXAMINATION: ECOG PERFORMANCE STATUS: 1 - Symptomatic but completely ambulatory  Vitals:   07/03/23 1458  BP: 125/66  Pulse: 80  Resp: 20  Temp: 98.1 F (36.7 C)  SpO2: 96%      Filed Weights   07/03/23 1458  Weight: 174 lb 14.4 oz (79.3 kg)   GENERAL: Well-appearing elderly Caucasian male, alert, no distress and comfortable SKIN: skin color, texture, turgor are normal, no rashes or significant lesions EYES: conjunctiva are pink and non-injected, sclera clear LUNGS: clear to auscultation and percussion with normal breathing effort HEART: regular rate & rhythm and no murmurs and no lower extremity edema Musculoskeletal: no cyanosis of digits and no clubbing  PSYCH: alert & oriented x 3, fluent speech NEURO: no focal motor/sensory deficits  LABORATORY DATA:  I have reviewed the data as listed    Latest Ref Rng & Units 07/01/2023    9:48 AM 06/03/2023    9:29 AM 05/06/2023   10:16 AM  CBC  WBC 4.0 - 10.5  K/uL 1.5  3.5  5.0   Hemoglobin 13.0 - 17.0 g/dL 8.6  16.1  09.6   Hematocrit 39.0 - 52.0 % 25.3  32.5  36.6   Platelets 150 - 400 K/uL 154  162  224        Latest Ref Rng & Units 07/01/2023    9:48 AM 06/03/2023    9:29 AM 05/06/2023   10:16 AM  CMP  Glucose 70 - 99 mg/dL 97  045  409   BUN 8 - 23 mg/dL 26  21  19    Creatinine 0.61 - 1.24 mg/dL 8.11  9.14  7.82   Sodium 135 - 145 mmol/L 137  137  137   Potassium 3.5 - 5.1 mmol/L 5.0  4.7  4.6   Chloride 98 - 111 mmol/L 106  105  103   CO2 22 - 32 mmol/L 25  27  26    Calcium 8.9 - 10.3 mg/dL 9.3  9.4  9.1   Total Protein 6.5 - 8.1 g/dL 6.5  6.8  7.0   Total Bilirubin 0.3 - 1.2 mg/dL 1.1  1.1  1.2   Alkaline Phos 38 - 126 U/L 62  59  57   AST 15 - 41 U/L 57  55  39   ALT 0 - 44 U/L 32  35  33     Lab Results  Component Value Date   MPROTEIN Not Observed 12/27/2021   Lab Results  Component Value Date   KPAFRELGTCHN 16.0 12/27/2021   LAMBDASER 12.2 12/27/2021   KAPLAMBRATIO 1.31 12/27/2021     RADIOGRAPHIC STUDIES: No results found.  ASSESSMENT & PLAN Frederick Ponce is a 77 y.o. 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  # Metastatic castrate sensitive prostate cancer. # Prostate cancer metastatic to the lung --Currently on Rucaparib 600 mg PO twice daily --Labs from 07/01/2023 reviewed with patient. WBC 1.5, Hgb 8.6, MCV 101.2, Plt 154,  Creatinine 1.18, AST 57, Alt 32. PSA has has improved from 4.3 to 1.4.  --Due to worsening neutropenia and anemia, recommend to hold Rucaparib at this time.  -- Last dose of Eligard on 04/19/2023. Next dose in 4 months.  --Proceed with Xgeva injection today.  --Plan to obtain restaging CT imaging end of this month.  --Return to clinic in 1 week for lab check and 2 weeks for labs/follow up.   Orders Placed This Encounter  Procedures   CBC with Differential (Cancer Center Only)     Standing Status:   Future    Standing Expiration Date:   07/02/2024   CMP (Cancer Center only)    Standing Status:   Future    Standing Expiration Date:   07/02/2024    All questions were answered. The patient knows to call the clinic with any problems, questions or concerns.  I have spent a total of 30 minutes minutes of face-to-face and non-face-to-face time, preparing to see the patient, performing a medically appropriate examination, counseling and educating the patient, documenting clinical information in the electronic health record, independently interpreting results and communicating results to the patient, and care coordination.   Georga Kaufmann PA-C Dept of Hematology and Oncology [facility] Phone: [phone]    07/03/2023 11:14 PM

## 2023-07-05 ENCOUNTER — Telehealth: Payer: Self-pay

## 2023-07-05 NOTE — Telephone Encounter (Signed)
Pt called and LVM reporting that his pain was increased overnight and needed to know what next steps were. This RN returned his call, pt reported pain was now a 3/10 and he took tylenol as well as starting his fentanyl patch on 07/04/23 at 730am. At that time pt also stopped his MS Contin (despite being instructed to bridge over to the patch) because pt was worried about having too much mediation in his system. Pt was educated on why we recommended to bridge the patch for the first day and pt was also educated on the need to now stop/no longer take his MS Contin since it sounds like his pain is improving. Pt verbalized understanding and agreement to call back with any questions/concerns.

## 2023-07-08 ENCOUNTER — Other Ambulatory Visit: Payer: Self-pay

## 2023-07-08 ENCOUNTER — Other Ambulatory Visit: Payer: Self-pay | Admitting: Nurse Practitioner

## 2023-07-08 DIAGNOSIS — G893 Neoplasm related pain (acute) (chronic): Secondary | ICD-10-CM

## 2023-07-08 DIAGNOSIS — Z515 Encounter for palliative care: Secondary | ICD-10-CM

## 2023-07-08 DIAGNOSIS — C61 Malignant neoplasm of prostate: Secondary | ICD-10-CM

## 2023-07-08 MED ORDER — FENTANYL 25 MCG/HR TD PT72
1.0000 | MEDICATED_PATCH | TRANSDERMAL | 0 refills | Status: DC
Start: 2023-07-09 — End: 2023-08-08

## 2023-07-08 MED ORDER — TRAMADOL HCL 50 MG PO TABS
50.0000 mg | ORAL_TABLET | Freq: Four times a day (QID) | ORAL | 0 refills | Status: DC | PRN
Start: 2023-07-09 — End: 2023-10-17

## 2023-07-10 ENCOUNTER — Other Ambulatory Visit: Payer: Self-pay

## 2023-07-10 ENCOUNTER — Inpatient Hospital Stay: Payer: Medicare Other

## 2023-07-10 ENCOUNTER — Telehealth: Payer: Self-pay

## 2023-07-10 ENCOUNTER — Inpatient Hospital Stay: Payer: Medicare Other | Admitting: Physician Assistant

## 2023-07-10 ENCOUNTER — Other Ambulatory Visit: Payer: Self-pay | Admitting: Physician Assistant

## 2023-07-10 VITALS — BP 150/78 | HR 64 | Temp 97.7°F | Resp 16

## 2023-07-10 VITALS — BP 139/74 | HR 66 | Temp 97.5°F | Resp 16

## 2023-07-10 DIAGNOSIS — D649 Anemia, unspecified: Secondary | ICD-10-CM | POA: Diagnosis not present

## 2023-07-10 DIAGNOSIS — D701 Agranulocytosis secondary to cancer chemotherapy: Secondary | ICD-10-CM | POA: Diagnosis not present

## 2023-07-10 DIAGNOSIS — C61 Malignant neoplasm of prostate: Secondary | ICD-10-CM | POA: Diagnosis not present

## 2023-07-10 DIAGNOSIS — Z5111 Encounter for antineoplastic chemotherapy: Secondary | ICD-10-CM | POA: Diagnosis not present

## 2023-07-10 DIAGNOSIS — C7951 Secondary malignant neoplasm of bone: Secondary | ICD-10-CM

## 2023-07-10 DIAGNOSIS — T451X5A Adverse effect of antineoplastic and immunosuppressive drugs, initial encounter: Secondary | ICD-10-CM

## 2023-07-10 LAB — CMP (CANCER CENTER ONLY)
ALT: 23 U/L (ref 0–44)
AST: 28 U/L (ref 15–41)
Albumin: 4.2 g/dL (ref 3.5–5.0)
Alkaline Phosphatase: 61 U/L (ref 38–126)
Anion gap: 5 (ref 5–15)
BUN: 21 mg/dL (ref 8–23)
CO2: 26 mmol/L (ref 22–32)
Calcium: 9.3 mg/dL (ref 8.9–10.3)
Chloride: 108 mmol/L (ref 98–111)
Creatinine: 0.98 mg/dL (ref 0.61–1.24)
GFR, Estimated: >60 mL/min (ref >60)
Glucose, Bld: 102 mg/dL — ABNORMAL HIGH (ref 70–99)
Potassium: 4.8 mmol/L (ref 3.5–5.1)
Sodium: 139 mmol/L (ref 135–145)
Total Bilirubin: 0.9 mg/dL (ref 0.3–1.2)
Total Protein: 6.5 g/dL (ref 6.5–8.1)

## 2023-07-10 LAB — IRON AND IRON BINDING CAPACITY (CC-WL,HP ONLY)
Iron: 223 ug/dL — ABNORMAL HIGH (ref 45–182)
Saturation Ratios: 96 % — ABNORMAL HIGH (ref 17.9–39.5)
TIBC: 232 ug/dL — ABNORMAL LOW (ref 250–450)
UIBC: 9 ug/dL — ABNORMAL LOW (ref 117–376)

## 2023-07-10 LAB — RETIC PANEL
Immature Retic Fract: 5.2 % (ref 2.3–15.9)
RBC.: 2.19 MIL/uL — ABNORMAL LOW (ref 4.22–5.81)
Retic Count, Absolute: 5.5 10*3/uL — ABNORMAL LOW (ref 19.0–186.0)
Retic Ct Pct: <0.4 % — ABNORMAL LOW (ref 0.4–3.1)
Reticulocyte Hemoglobin: 39.2 pg (ref >27.9)

## 2023-07-10 LAB — CBC WITH DIFFERENTIAL (CANCER CENTER ONLY)
Abs Immature Granulocytes: 0 10*3/uL (ref 0.00–0.07)
Basophils Absolute: 0 10*3/uL (ref 0.0–0.1)
Basophils Relative: 2 %
Eosinophils Absolute: 0 10*3/uL (ref 0.0–0.5)
Eosinophils Relative: 1 %
HCT: 22 % — ABNORMAL LOW (ref 39.0–52.0)
Hemoglobin: 7.6 g/dL — ABNORMAL LOW (ref 13.0–17.0)
Immature Granulocytes: 0 %
Lymphocytes Relative: 65 %
Lymphs Abs: 0.8 10*3/uL (ref 0.7–4.0)
MCH: 34.5 pg — ABNORMAL HIGH (ref 26.0–34.0)
MCHC: 34.5 g/dL (ref 30.0–36.0)
MCV: 100 fL (ref 80.0–100.0)
Monocytes Absolute: 0.3 10*3/uL (ref 0.1–1.0)
Monocytes Relative: 26 %
Neutro Abs: 0.1 10*3/uL — CL (ref 1.7–7.7)
Neutrophils Relative %: 6 %
Platelet Count: 161 10*3/uL (ref 150–400)
RBC: 2.2 MIL/uL — ABNORMAL LOW (ref 4.22–5.81)
RDW: 14.3 % (ref 11.5–15.5)
Smear Review: NORMAL
WBC Count: 1.3 10*3/uL — ABNORMAL LOW (ref 4.0–10.5)
nRBC: 0 % (ref 0.0–0.2)

## 2023-07-10 LAB — FERRITIN: Ferritin: 385 ng/mL — ABNORMAL HIGH (ref 24–336)

## 2023-07-10 LAB — ABO/RH: ABO/RH(D): A NEG

## 2023-07-10 LAB — PREPARE RBC (CROSSMATCH)

## 2023-07-10 LAB — SAMPLE TO BLOOD BANK

## 2023-07-10 MED ORDER — ACETAMINOPHEN 325 MG PO TABS
650.0000 mg | ORAL_TABLET | Freq: Once | ORAL | Status: AC
  Administered 2023-07-10: 650 mg via ORAL

## 2023-07-10 MED ORDER — ACETAMINOPHEN 325 MG PO TABS: 650.0000 mg | ORAL_TABLET | Freq: Once | ORAL | Status: DC

## 2023-07-10 MED ORDER — SODIUM CHLORIDE 0.9% IV SOLUTION
250.0000 mL | Freq: Once | INTRAVENOUS | Status: AC
  Administered 2023-07-10: 250 mL via INTRAVENOUS

## 2023-07-10 NOTE — Progress Notes (Signed)
Symptom Management Consult Note [facility]    Patient Care Team: Arnette Felts, FNP as PCP - General (General Practice) Pickenpack-Cousar, Arty Baumgartner, NP as Nurse Practitioner (Nurse Practitioner) Alisa Graff, Pascal Lux, LCSW as Social Worker (Licensed Clinical Social Worker)    Name / MRN / DOB: Frederick Ponce  [id number]  1946/01/14   Date of visit: 07/10/2023   Chief Complaint/Reason for visit: abnormal labs   Current Therapy: Denosumab, Rucaparib 600 mg PO   Last treatment:  Treatment 4 on 07/03/23   ASSESSMENT & PLAN: Patient is a 77 y.o. 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 followed by Dr. Leonides Schanz.  I have viewed most recent oncology note and lab work.    #Metastatic castrate sensitive prostate cancer.  - Next appointment with oncologist is 07/31/23.   #Neutropenia and anemia -Patient had lab appointment earlier this morning and was found to be neutropenic WBC 1.3 and ANC 0.1 along with worsening anemia HgB 7.6. Iron studies ordered by primary provider and were collected during [facility] appointment. -Patient denies any abnormal bleeding or infectious symptoms. -We discussed some of the risks, benefits, and alternatives of blood transfusions. The patient is symptomatic from anemia and the hemoglobin level is critically low. Some of the side-effects to be expected including risks of transfusion reactions, chills, infection, syndrome of volume overload and risk of hospitalization from various reasons and the patient is willing to proceed and went ahead to sign consent today. -Discussed neutropenic precautions. -Patient advised to continue to hold Rucaparib 2/2 neutropenia and anemia.  Strict ED precautions discussed should symptoms worsen.     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  The report date is December 21, 2022.  The CancerNext-Expanded gene panel offered by [facility] and includes sequencing and rearrangement analysis for the following 77 genes: AIP, ALK, APC*, ATM*, AXIN2, BAP1, BARD1, BLM, BMPR1A, BRCA1*, BRCA2*, BRIP1*, CDC73, CDH1*, CDK4, CDKN1B, CDKN2A, CHEK2*, CTNNA1, DICER1, FANCC, FH, FLCN, GALNT12, KIF1B, LZTR1, MAX, MEN1, MET, MLH1*, MSH2*, MSH3, MSH6*, MUTYH*, NBN, NF1*, NF2, NTHL1, PALB2*, PHOX2B, PMS2*, POT1, PRKAR1A, PTCH1, PTEN*, RAD51C*, RAD51D*, RB1, RECQL, RET, SDHA, SDHAF2, SDHB, SDHC, SDHD, SMAD4, SMARCA4, SMARCB1, SMARCE1, STK11, SUFU, TMEM127, TP53*, TSC1, TSC2, VHL and XRCC2 (sequencing and deletion/duplication); EGFR, EGLN1, HOXB13, KIT, MITF, PDGFRA, POLD1, and POLE (sequencing only); EPCAM and GREM1 (deletion/duplication only). DNA and RNA analyses performed for * genes.        Interval history-: Frederick Ponce is a 77 y.o. male with oncologic history as above presenting to [facility] today with chief complaint of abnormal labs.  Patient presents unaccompanied to clinic today.  Patient admits to feeling frustrated about he admits to feeling extremely fatigued for the last several weeks he denies any active or abnormal bleeding.  Besides the fatigue patient denies feeling unwell.  He has normal p.o. intake.  No OTC medications taken prior to appointment.  He has no additional complaints at this time.      ROS  All other systems are  reviewed and are negative for acute change except as noted in the HPI.    Allergies  Allergen Reactions   Gabapentin Other (See Comments)    Restless legs/confusion    Celebrex [Celecoxib] Rash     Past Medical History:  Diagnosis Date   Arthritis    Depression    Family history of melanoma    Family history of prostate cancer    GERD (gastroesophageal reflux disease)    Hyperlipidemia    Hypertension    Parkinson disease    Prostate cancer (HCC)      No past surgical history on file.  Social History   Socioeconomic History   Marital status: Married    Spouse name: Janae Sauce   Number of children: 0   Years of education: Not on file   Highest education level: Associate degree: academic program  Occupational History   Not on file  Tobacco Use   Smoking status: Never   Smokeless tobacco: Never  Vaping Use   Vaping status: Never Used  Substance and Sexual Activity   Alcohol use: Yes    Alcohol/week: 15.0 standard drinks of alcohol    Types: 15 Glasses of wine per week    Comment: couple glasses a night   Drug use: Never   Sexual activity: Not Currently    Birth control/protection: None  Other Topics Concern   Not on file  Social History Narrative   Not on file   Social Determinants of Health   Financial Resource Strain: Low Risk  (01/03/2023)   Overall Financial Resource Strain (CARDIA)    Difficulty of Paying Living Expenses: Not hard at all  Food Insecurity: No Food Insecurity (01/03/2023)   Hunger Vital Sign    Worried About Running Out of Food in the Last Year: Never true    Ran Out of Food in the Last Year: Never true  Transportation Needs: No Transportation Needs (01/03/2023)   PRAPARE - Administrator, Civil Service (Medical): No    Lack of Transportation (Non-Medical): No  Physical Activity: Inactive (01/03/2023)   Exercise Vital Sign    Days of Exercise per Week: 0 days    Minutes of Exercise per Session: 0 min  Stress: No Stress Concern Present (01/03/2023)   [employer] of Occupational Health - Occupational Stress Questionnaire    Feeling of Stress : Not at all  Social Connections: Unknown  (08/03/2022)   [facility] from [facility]   Social Network    Social Network: Not on file  Intimate Partner Violence: Not At Risk (10/12/2022)   Humiliation, Afraid, Rape, and Kick questionnaire    Fear of Current or Ex-Partner: No    Emotionally Abused: No    Physically Abused: No    Sexually Abused: No    Family History  Problem Relation Age of Onset   Kidney disease Mother    Colon cancer Mother    Heart disease Father    Tremor Father    Parkinson's disease Father    Prostate cancer Father    Melanoma Sister    Parkinson's disease Sister    Melanoma Sister        melanoma x2   Prostate cancer Brother    Parkinson's disease Paternal Grandmother    Lung cancer Paternal Grandfather    Parkinson's disease Paternal Aunt    Lung cancer Paternal Aunt      Current Outpatient Medications:    acetaminophen (TYLENOL) 500 MG tablet, Take 1,000 mg by  mouth every 6 (six) hours as needed., Disp: , Rfl:    amLODipine (NORVASC) 5 MG tablet, Take 1 tablet (5 mg total) by mouth daily., Disp: 90 tablet, Rfl: 1   atorvastatin (LIPITOR) 40 MG tablet, TAKE ONE TABLET BY MOUTH EVERY MORNING, Disp: 90 tablet, Rfl: 1   calcium-vitamin D (OSCAL WITH D) 500-5 MG-MCG tablet, Take 1 tablet by mouth 2 (two) times daily., Disp: 60 tablet, Rfl: 3   carbidopa-levodopa (SINEMET) 25-100 MG tablet, Take 1 tablet by mouth 5 (five) times daily., Disp: 450 tablet, Rfl: 1   COENZYME Q10 PO, Take 1 capsule by mouth every morning., Disp: , Rfl:    fentaNYL (DURAGESIC) 12 MCG/HR, Place 1 patch onto the skin every 3 (three) days., Disp: 5 patch, Rfl: 0   fentaNYL (DURAGESIC) 25 MCG/HR, Place 1 patch onto the skin every 3 (three) days., Disp: 10 patch, Rfl: 0   ibuprofen (ADVIL) 200 MG tablet, Take 200 mg by mouth every 6 (six) hours as needed., Disp: , Rfl:    KRILL OIL PO, Take 1 capsule by mouth every morning., Disp: , Rfl:    meloxicam (MOBIC) 7.5 MG tablet, Take 1 tablet (7.5 mg total) by mouth daily., Disp:  60 tablet, Rfl: 1   multivitamin-lutein (OCUVITE-LUTEIN) CAPS capsule, Take 1 capsule by mouth every morning., Disp: , Rfl:    ondansetron (ZOFRAN) 8 MG tablet, Take 1 tablet (8 mg total) by mouth every 8 (eight) hours as needed for nausea or vomiting., Disp: 20 tablet, Rfl: 1   rucaparib camsylate (RUBRACA) 300 MG tablet, Take 2 tablets (600 mg total) by mouth 2 (two) times daily., Disp: 120 tablet, Rfl: 1   traMADol (ULTRAM) 50 MG tablet, Take 1 tablet (50 mg total) by mouth every 6 (six) hours as needed., Disp: 30 tablet, Rfl: 0   vitamin B-12 (CYANOCOBALAMIN) 1000 MCG tablet, Take 1,000 mcg by mouth every morning., Disp: , Rfl:   Current Facility-Administered Medications:    acetaminophen (TYLENOL) tablet 650 mg, 650 mg, Oral, Once, Thayil, Irene T, PA-C  PHYSICAL EXAM: ECOG FS:1 - Symptomatic but completely ambulatory    Vitals:   07/10/23 1214 07/10/23 1450  BP: 133/72 (!) 150/78  Pulse: 65 64  Resp: 18 16  Temp: 97.8 F (36.6 C) 97.7 F (36.5 C)  TempSrc: Oral Oral  SpO2: 98% 100%   Physical Exam Vitals and nursing note reviewed.  Constitutional:      Appearance: He is not ill-appearing or toxic-appearing.  HENT:     Head: Normocephalic.  Eyes:     Conjunctiva/sclera: Conjunctivae normal.  Cardiovascular:     Rate and Rhythm: Normal rate.  Pulmonary:     Effort: Pulmonary effort is normal.  Abdominal:     General: There is no distension.  Musculoskeletal:     Cervical back: Normal range of motion.  Skin:    General: Skin is warm and dry.  Neurological:     Mental Status: He is alert.        LABORATORY DATA: I have reviewed the data as listed    Latest Ref Rng & Units 07/10/2023    9:23 AM 07/01/2023    9:48 AM 06/03/2023    9:29 AM  CBC  WBC 4.0 - 10.5 K/uL 1.3  1.5  3.5   Hemoglobin 13.0 - 17.0 g/dL 7.6  8.6  82.9   Hematocrit 39.0 - 52.0 % 22.0  25.3  32.5   Platelets 150 - 400 K/uL 161  154  162  Latest Ref Rng & Units 07/10/2023    9:23  AM 07/01/2023    9:48 AM 06/03/2023    9:29 AM  CMP  Glucose 70 - 99 mg/dL 161  97  096   BUN 8 - 23 mg/dL 21  26  21    Creatinine 0.61 - 1.24 mg/dL 0.45  4.09  8.11   Sodium 135 - 145 mmol/L 139  137  137   Potassium 3.5 - 5.1 mmol/L 4.8  5.0  4.7   Chloride 98 - 111 mmol/L 108  106  105   CO2 22 - 32 mmol/L 26  25  27    Calcium 8.9 - 10.3 mg/dL 9.3  9.3  9.4   Total Protein 6.5 - 8.1 g/dL 6.5  6.5  6.8   Total Bilirubin 0.3 - 1.2 mg/dL 0.9  1.1  1.1   Alkaline Phos 38 - 126 U/L 61  62  59   AST 15 - 41 U/L 28  57  55   ALT 0 - 44 U/L 23  32  35        RADIOGRAPHIC STUDIES (from last 24 hours if applicable) I have personally reviewed the radiological images as listed and agreed with the findings in the report. No results found.      Visit Diagnosis: 1. Anemia, unspecified type   2. Prostate 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 (HCC)   3. Chemotherapy-induced neutropenia (HCC)      Orders Placed This Encounter  Procedures   Informed Consent Details: Physician/Practitioner Attestation; Transcribe to consent form and obtain patient signature    Standing Status:   Future    Standing Expiration Date:   07/09/2024    Order Specific Question:   Physician/Practitioner attestation of informed consent for blood and or blood product transfusion    Answer:   I, the physician/practitioner, attest that I have discussed with the patient the benefits, risks, side effects, alternatives, likelihood of achieving goals and potential problems during recovery for the procedure that I have provided informed consent.    Order Specific Question:   Product(s)    Answer:   All Product(s)   ABO/RH    Standing Status:   Future    Number of Occurrences:   1    Standing Expiration Date:   07/09/2024   Type and screen         Standing Status:   Future    Number of Occurrences:   1    Standing Expiration Date:   07/09/2024    All questions were answered. The patient knows to call the clinic with any  problems, questions or concerns. No barriers to learning was detected.  A total of more than 30 minutes were spent on this encounter with face-to-face time and non-face-to-face time, including preparing to see the patient, ordering tests and/or medications, counseling the patient and coordination of care as outlined above.    Thank you for allowing me to participate in the care of this patient.    Shanon Ace, PA-C Department of Hematology/Oncology [facility] Phone: [phone]  [phone]    07/10/2023 3:05 PM

## 2023-07-10 NOTE — Telephone Encounter (Signed)
CRITICAL VALUE STICKER  CRITICAL VALUE: ANC 0.1  RECEIVER (on-site recipient of call): Sharlette Dense CMA  DATE & TIME NOTIFIED: 07/10/2023 1037  MESSENGER (representative from lab): Heather in Lab  MD NOTIFIED: Georga Kaufmann PA  TIME OF NOTIFICATION: 1037  RESPONSE: Made doctor and nurse aware

## 2023-07-10 NOTE — Patient Instructions (Signed)

## 2023-07-10 NOTE — Telephone Encounter (Signed)
Please advise pt counts are getting worse and he still needs to hold his Rubraca  Since his Hgb dropped to 7.6, I was going to give him a unit of blood. IT  Pt advise to hold Rubraca and that his hgb is 7.6.  He will be seen today in [facility]  at 11:30 for labs and 1 unit of blood after.

## 2023-07-10 NOTE — Progress Notes (Signed)
Per Namon Cirri, PA-C, ok to increase rate of blood transfusion to 300 ml/hr due to delay with getting blood from blood bank.

## 2023-07-11 LAB — BPAM RBC
Blood Product Expiration Date: 202408142359
ISSUE DATE / TIME: 202407171423
Unit Type and Rh: 600

## 2023-07-11 LAB — TYPE AND SCREEN
ABO/RH(D): A NEG
Antibody Screen: NEGATIVE
Unit division: 0

## 2023-07-11 LAB — PROSTATE-SPECIFIC AG, SERUM (LABCORP): Prostate Specific Ag, Serum: 1.3 ng/mL (ref 0.0–4.0)

## 2023-07-12 ENCOUNTER — Other Ambulatory Visit: Payer: Self-pay | Admitting: Nurse Practitioner

## 2023-07-16 ENCOUNTER — Inpatient Hospital Stay: Payer: Medicare Other

## 2023-07-16 ENCOUNTER — Inpatient Hospital Stay (HOSPITAL_BASED_OUTPATIENT_CLINIC_OR_DEPARTMENT_OTHER): Payer: Medicare Other | Admitting: Hematology and Oncology

## 2023-07-16 ENCOUNTER — Other Ambulatory Visit: Payer: Self-pay

## 2023-07-16 ENCOUNTER — Telehealth: Payer: Self-pay | Admitting: Hematology and Oncology

## 2023-07-16 DIAGNOSIS — C61 Malignant neoplasm of prostate: Secondary | ICD-10-CM | POA: Diagnosis not present

## 2023-07-16 DIAGNOSIS — C7951 Secondary malignant neoplasm of bone: Secondary | ICD-10-CM

## 2023-07-16 DIAGNOSIS — D701 Agranulocytosis secondary to cancer chemotherapy: Secondary | ICD-10-CM | POA: Diagnosis not present

## 2023-07-16 DIAGNOSIS — D649 Anemia, unspecified: Secondary | ICD-10-CM | POA: Diagnosis not present

## 2023-07-16 DIAGNOSIS — T451X5A Adverse effect of antineoplastic and immunosuppressive drugs, initial encounter: Secondary | ICD-10-CM

## 2023-07-16 DIAGNOSIS — Z5111 Encounter for antineoplastic chemotherapy: Secondary | ICD-10-CM | POA: Diagnosis not present

## 2023-07-16 LAB — CMP (CANCER CENTER ONLY)
ALT: 10 U/L (ref 0–44)
AST: 16 U/L (ref 15–41)
Albumin: 4.2 g/dL (ref 3.5–5.0)
Alkaline Phosphatase: 52 U/L (ref 38–126)
Anion gap: 6 (ref 5–15)
BUN: 21 mg/dL (ref 8–23)
CO2: 25 mmol/L (ref 22–32)
Calcium: 9.4 mg/dL (ref 8.9–10.3)
Chloride: 108 mmol/L (ref 98–111)
Creatinine: 0.93 mg/dL (ref 0.61–1.24)
GFR, Estimated: >60 mL/min (ref >60)
Glucose, Bld: 92 mg/dL (ref 70–99)
Potassium: 4.6 mmol/L (ref 3.5–5.1)
Sodium: 139 mmol/L (ref 135–145)
Total Bilirubin: 0.8 mg/dL (ref 0.3–1.2)
Total Protein: 6.3 g/dL — ABNORMAL LOW (ref 6.5–8.1)

## 2023-07-16 LAB — CBC WITH DIFFERENTIAL (CANCER CENTER ONLY)
Abs Immature Granulocytes: 0.02 10*3/uL (ref 0.00–0.07)
Basophils Absolute: 0 10*3/uL (ref 0.0–0.1)
Basophils Relative: 1 %
Eosinophils Absolute: 0 10*3/uL (ref 0.0–0.5)
Eosinophils Relative: 0 %
HCT: 27.2 % — ABNORMAL LOW (ref 39.0–52.0)
Hemoglobin: 9.1 g/dL — ABNORMAL LOW (ref 13.0–17.0)
Immature Granulocytes: 1 %
Lymphocytes Relative: 39 %
Lymphs Abs: 1 10*3/uL (ref 0.7–4.0)
MCH: 33.5 pg (ref 26.0–34.0)
MCHC: 33.5 g/dL (ref 30.0–36.0)
MCV: 100 fL (ref 80.0–100.0)
Monocytes Absolute: 0.3 10*3/uL (ref 0.1–1.0)
Monocytes Relative: 13 %
Neutro Abs: 1.2 10*3/uL — ABNORMAL LOW (ref 1.7–7.7)
Neutrophils Relative %: 46 %
Platelet Count: 211 10*3/uL (ref 150–400)
RBC: 2.72 MIL/uL — ABNORMAL LOW (ref 4.22–5.81)
RDW: 14.9 % (ref 11.5–15.5)
WBC Count: 2.5 10*3/uL — ABNORMAL LOW (ref 4.0–10.5)
nRBC: 0 % (ref 0.0–0.2)

## 2023-07-16 MED ORDER — DULOXETINE HCL 30 MG PO CPEP: 30.0000 mg | ORAL_CAPSULE | Freq: Every day | ORAL | 3 refills | Status: DC

## 2023-07-16 NOTE — Progress Notes (Signed)
Frederick [facility]once [phone]   [phone]  PROGRESS NOTE  Patient Care Team: Arnette Felts, FNP as PCP - General (General Practice) Pickenpack-Cousar, Arty Baumgartner, NP as Nurse Practitioner (Nurse Practitioner) Alisa Graff, Pascal Lux, LCSW as Social Worker (Licensed Clinical Social Worker)  Hematological/Oncological History # Metastatic castrate sensitive prostate cancer. 01/15/22: CT-guided biopsy of a pulmonary nodule confirmed the presence of prostate cancer 01/23/2022:  Firmagon 240 mg started  Feb 2023: radiation therapy to the left sacroiliac pelvic bones completed. He received 30 Gray in 10 fractions  03/06/2022: Abiraterone 1000 mg daily with prednisone 5 mg daily started  01/03/2023: last visit with Dr. Clelia Croft. Restarted 500 mg abiraterone daily.  02/06/2023: transfer care to Dr. Leonides Schanz. Increased dose to 1000 mg abiraterone daily.  04/11/2023: Due to increasing PSA transition to rucaparib 600 mg twice daily 07/03/2023: Held Rucuaparib due to neutropenia and anemia.   Interval History:  Frederick Ponce 77 y.o. 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 The patient's last visit was on 07/01/2023.   On exam today Frederick Ponce reports he has been well overall in the interim since our last visit.  He notes that the drop in his energy levels were "no big surprise".  He notes he feels wiped out at baseline has not noticed feeling any worse.  He is concerned that his fentanyl patches are not helping him as much as he would like.  He did feel better after receiving his blood transfusion.  His primary issue at this time is his left leg pain.  He reports it predominantly occurs at night and is associated with "jitters and restless leg".  He has taken gabapentin and Lyrica in the past with no relief.  He was hoping the fentanyl would help but he is not getting much relief there.  He  reports that he has been eating well and his GI problems are resolving.  He is not having as much in the way of constipation since she switched off of morphine.  He reports he does have a persistent runny nose but no cough or sore throat.Marland KitchenHe denies fevers, chills, sweats,chest pain or cough.  A full 10 point ROS was otherwise negative.  At this time he was agreeable to holding his Rucuaparib and continuing weekly monitoring of his blood counts with return to clinic in 2 week's time.   MEDICAL HISTORY:  Past Medical History:  Diagnosis Date   Arthritis    Depression    Family history of melanoma    Family history of prostate cancer    GERD (gastroesophageal reflux disease)    Hyperlipidemia    Hypertension    Parkinson disease    Prostate cancer (HCC)     SURGICAL HISTORY: No past surgical history on file.  SOCIAL HISTORY: Social History   Socioeconomic History   Marital status: Married    Spouse name: Janae Sauce   Number of children: 0   Years of education: Not on file   Highest education level: Associate degree: academic program  Occupational History   Not on file  Tobacco Use   Smoking status: Never   Smokeless tobacco: Never  Vaping Use   Vaping status: Never Used  Substance and Sexual Activity   Alcohol use: Yes    Alcohol/week: 15.0 standard drinks of alcohol    Types: 15 Glasses of wine per week    Comment: couple glasses a  night   Drug use: Never   Sexual activity: Not Currently    Birth control/protection: None  Other Topics Concern   Not on file  Social History Narrative   Not on file   Social Determinants of Health   Financial Resource Strain: Low Risk  (01/03/2023)   Overall Financial Resource Strain (CARDIA)    Difficulty of Paying Living Expenses: Not hard at all  Food Insecurity: No Food Insecurity (01/03/2023)   Hunger Vital Sign    Worried About Running Out of Food in the Last Year: Never true    Ran Out of Food in the Last Year: Never true   Transportation Needs: No Transportation Needs (01/03/2023)   PRAPARE - Administrator, Civil Service (Medical): No    Lack of Transportation (Non-Medical): No  Physical Activity: Inactive (01/03/2023)   Exercise Vital Sign    Days of Exercise per Week: 0 days    Minutes of Exercise per Session: 0 min  Stress: No Stress Concern Present (01/03/2023)   [employer] of Occupational Health - Occupational Stress Questionnaire    Feeling of Stress : Not at all  Social Connections: Unknown (08/03/2022)   Received from [facility]   Social Network    Social Network: Not on file  Intimate Partner Violence: Not At Risk (10/12/2022)   Humiliation, Afraid, Rape, and Kick questionnaire    Fear of Current or Ex-Partner: No    Emotionally Abused: No    Physically Abused: No    Sexually Abused: No    FAMILY HISTORY: Family History  Problem Relation Age of Onset   Kidney disease Mother    Colon cancer Mother    Heart disease Father    Tremor Father    Parkinson's disease Father    Prostate cancer Father    Melanoma Sister    Parkinson's disease Sister    Melanoma Sister        melanoma x2   Prostate cancer Brother    Parkinson's disease Paternal Grandmother    Lung cancer Paternal Grandfather    Parkinson's disease Paternal Aunt    Lung cancer Paternal Aunt     ALLERGIES:  is allergic to gabapentin and celebrex [celecoxib].  MEDICATIONS:  Current Outpatient Medications  Medication Sig Dispense Refill   acetaminophen (TYLENOL) 500 MG tablet Take 1,000 mg by mouth every 6 (six) hours as needed.     amLODipine (NORVASC) 5 MG tablet Take 1 tablet (5 mg total) by mouth daily. 90 tablet 1   atorvastatin (LIPITOR) 40 MG tablet TAKE ONE TABLET BY MOUTH EVERY MORNING 90 tablet 1   calcium-vitamin D (OSCAL WITH D) 500-5 MG-MCG tablet Take 1 tablet by mouth 2 (two) times daily. 60 tablet 3   carbidopa-levodopa (SINEMET) 25-100 MG tablet Take 1 tablet by mouth 5 (five) times  daily. 450 tablet 1   COENZYME Q10 PO Take 1 capsule by mouth every morning.     fentaNYL (DURAGESIC) 12 MCG/HR Place 1 patch onto the skin every 3 (three) days. 5 patch 0   fentaNYL (DURAGESIC) 25 MCG/HR Place 1 patch onto the skin every 3 (three) days. 10 patch 0   ibuprofen (ADVIL) 200 MG tablet Take 200 mg by mouth every 6 (six) hours as needed.     KRILL OIL PO Take 1 capsule by mouth every morning.     meloxicam (MOBIC) 7.5 MG tablet Take 1 tablet (7.5 mg total) by mouth daily. 60 tablet 1   multivitamin-lutein (OCUVITE-LUTEIN) CAPS  capsule Take 1 capsule by mouth every morning.     ondansetron (ZOFRAN) 8 MG tablet Take 1 tablet (8 mg total) by mouth every 8 (eight) hours as needed for nausea or vomiting. 20 tablet 1   rucaparib camsylate (RUBRACA) 300 MG tablet Take 2 tablets (600 mg total) by mouth 2 (two) times daily. 120 tablet 1   traMADol (ULTRAM) 50 MG tablet Take 1 tablet (50 mg total) by mouth every 6 (six) hours as needed. 30 tablet 0   vitamin B-12 (CYANOCOBALAMIN) 1000 MCG tablet Take 1,000 mcg by mouth every morning.     No current facility-administered medications for this visit.    REVIEW OF SYSTEMS:   Constitutional: ( - ) fevers, ( - )  chills , ( - ) night sweats Eyes: ( - ) blurriness of vision, ( - ) double vision, ( - ) watery eyes Ears, nose, mouth, throat, and face: ( - ) mucositis, ( - ) sore throat Respiratory: ( - ) cough, ( - ) dyspnea, ( - ) wheezes Cardiovascular: ( - ) palpitation, ( - ) chest discomfort, ( - ) lower extremity swelling Gastrointestinal:  ( - ) nausea, ( - ) heartburn, ( - ) change in bowel habits Skin: ( - ) abnormal skin rashes Lymphatics: ( - ) new lymphadenopathy, ( - ) easy bruising Neurological: ( - ) numbness, ( - ) tingling, ( - ) new weaknesses Behavioral/Psych: ( - ) mood change, ( - ) new changes  All other systems were reviewed with the patient and are negative.  PHYSICAL EXAMINATION: ECOG PERFORMANCE STATUS: 1 -  Symptomatic but completely ambulatory  There were no vitals filed for this visit.     There were no vitals filed for this visit.  GENERAL: Well-appearing elderly Caucasian male, alert, no distress and comfortable SKIN: skin color, texture, turgor are normal, no rashes or significant lesions EYES: conjunctiva are pink and non-injected, sclera clear LUNGS: clear to auscultation and percussion with normal breathing effort HEART: regular rate & rhythm and no murmurs and no lower extremity edema Musculoskeletal: no cyanosis of digits and no clubbing  PSYCH: alert & oriented x 3, fluent speech NEURO: no focal motor/sensory deficits  LABORATORY DATA:  I have reviewed the data as listed    Latest Ref Rng & Units 07/10/2023    9:23 AM 07/01/2023    9:48 AM 06/03/2023    9:29 AM  CBC  WBC 4.0 - 10.5 K/uL 1.3  1.5  3.5   Hemoglobin 13.0 - 17.0 g/dL 7.6  8.6  16.1   Hematocrit 39.0 - 52.0 % 22.0  25.3  32.5   Platelets 150 - 400 K/uL 161  154  162        Latest Ref Rng & Units 07/10/2023    9:23 AM 07/01/2023    9:48 AM 06/03/2023    9:29 AM  CMP  Glucose 70 - 99 mg/dL 096  97  045   BUN 8 - 23 mg/dL 21  26  21    Creatinine 0.61 - 1.24 mg/dL 4.09  8.11  9.14   Sodium 135 - 145 mmol/L 139  137  137   Potassium 3.5 - 5.1 mmol/L 4.8  5.0  4.7   Chloride 98 - 111 mmol/L 108  106  105   CO2 22 - 32 mmol/L 26  25  27    Calcium 8.9 - 10.3 mg/dL 9.3  9.3  9.4   Total Protein 6.5 - 8.1 g/dL 6.5  6.5  6.8   Total Bilirubin 0.3 - 1.2 mg/dL 0.9  1.1  1.1   Alkaline Phos 38 - 126 U/L 61  62  59   AST 15 - 41 U/L 28  57  55   ALT 0 - 44 U/L 23  32  35     Lab Results  Component Value Date   MPROTEIN Not Observed 12/27/2021   Lab Results  Component Value Date   KPAFRELGTCHN 16.0 12/27/2021   LAMBDASER 12.2 12/27/2021   KAPLAMBRATIO 1.31 12/27/2021     RADIOGRAPHIC STUDIES: No results found.  ASSESSMENT & PLAN Frederick Ponce is a 77 y.o. 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  # Metastatic castrate sensitive prostate cancer. # Prostate cancer metastatic to the lung --HOLDING Rucaparib 600 mg PO twice daily.  Once counts recover we will restart 300 mg p.o. twice daily. --Labs today show 2.5, hemoglobin 9.1, MCV 100, and platelets of 211.  -- Previous labs show that PSA has has improved from 4.3 to 1.3.  --Due to worsening neutropenia and anemia, recommend to hold Rucaparib at this time.  -- Last dose of Eligard on 04/19/2023. Next dose in 4 months (August 2024).  --Proceed with Xgeva injection monthly.  --Plan to obtain restaging CT imaging end of this month (scheduled for 7/25) --Return to clinic in 1 week for lab check and 2 weeks for labs/follow up.   No orders of the defined types were placed in this encounter.   All questions were answered. The patient knows to call the clinic with any problems, questions or concerns.  I have spent a total of 30 minutes minutes of face-to-face and non-face-to-face time, preparing to see the patient, performing a medically appropriate examination, counseling and educating the patient, documenting clinical information in the electronic health record, independently interpreting results and communicating results to the patient, and care coordination.   Ulysees Barns, MD Department of Hematology/Oncology [facility] Phone: [phone] Pager: [phone] Email: [email or web address]  07/16/2023 8:48 AM

## 2023-07-17 ENCOUNTER — Telehealth: Payer: Self-pay | Admitting: *Deleted

## 2023-07-17 LAB — PROSTATE-SPECIFIC AG, SERUM (LABCORP): Prostate Specific Ag, Serum: 1.2 ng/mL (ref 0.0–4.0)

## 2023-07-17 NOTE — Telephone Encounter (Signed)
TCT patient regarding new medication prescribed by Dr. Leonides Schanz. Pt had spoken with a scheduler about a new prescription-Duloxetine.  He told the scheduler he has had reactions to this in the past-itching, hives. No answer to call. LVM message on identified phone vm for pt to eturn call at his convenience.

## 2023-07-17 NOTE — Telephone Encounter (Addendum)
Received call back from pt regarding the Duloxetine that Dr. Leonides Schanz ordered for him yesterday. Pt states that his pharmacist told him that the Duloxetine was contraindicated with his Fentanyl Patch. Pt decided not to take the Duloxetine. He states it was not a reaction to the medication as he did not take it.  He did not want a call back.

## 2023-07-18 ENCOUNTER — Ambulatory Visit (HOSPITAL_COMMUNITY)
Admission: RE | Admit: 2023-07-18 | Discharge: 2023-07-18 | Disposition: A | Discharge status: Home / Self Care | Payer: Medicare Other | Source: Ambulatory Visit | Attending: Physician Assistant | Admitting: Physician Assistant

## 2023-07-18 DIAGNOSIS — C61 Malignant neoplasm of prostate: Secondary | ICD-10-CM | POA: Insufficient documentation

## 2023-07-18 DIAGNOSIS — C7951 Secondary malignant neoplasm of bone: Secondary | ICD-10-CM | POA: Insufficient documentation

## 2023-07-18 MED ORDER — IOHEXOL 300 MG/ML  SOLN
100.0000 mL | Freq: Once | INTRAMUSCULAR | Status: AC | PRN
  Administered 2023-07-18: 100 mL via INTRAVENOUS

## 2023-07-18 MED ORDER — SODIUM CHLORIDE (PF) 0.9 % IJ SOLN
INTRAMUSCULAR | Status: AC
  Filled 2023-07-18: qty 50

## 2023-07-24 ENCOUNTER — Inpatient Hospital Stay: Payer: Medicare Other

## 2023-07-24 ENCOUNTER — Other Ambulatory Visit: Payer: Self-pay

## 2023-07-24 DIAGNOSIS — C61 Malignant neoplasm of prostate: Secondary | ICD-10-CM

## 2023-07-24 DIAGNOSIS — Z5111 Encounter for antineoplastic chemotherapy: Secondary | ICD-10-CM | POA: Diagnosis not present

## 2023-07-24 LAB — CBC WITH DIFFERENTIAL (CANCER CENTER ONLY)
Abs Immature Granulocytes: 0.02 10*3/uL (ref 0.00–0.07)
Basophils Absolute: 0 10*3/uL (ref 0.0–0.1)
Basophils Relative: 1 %
Eosinophils Absolute: 0 10*3/uL (ref 0.0–0.5)
Eosinophils Relative: 0 %
HCT: 27.5 % — ABNORMAL LOW (ref 39.0–52.0)
Hemoglobin: 9.3 g/dL — ABNORMAL LOW (ref 13.0–17.0)
Immature Granulocytes: 1 %
Lymphocytes Relative: 27 %
Lymphs Abs: 1 10*3/uL (ref 0.7–4.0)
MCH: 34.6 pg — ABNORMAL HIGH (ref 26.0–34.0)
MCHC: 33.8 g/dL (ref 30.0–36.0)
MCV: 102.2 fL — ABNORMAL HIGH (ref 80.0–100.0)
Monocytes Absolute: 0.3 10*3/uL (ref 0.1–1.0)
Monocytes Relative: 9 %
Neutro Abs: 2.3 10*3/uL (ref 1.7–7.7)
Neutrophils Relative %: 62 %
Platelet Count: 213 10*3/uL (ref 150–400)
RBC: 2.69 MIL/uL — ABNORMAL LOW (ref 4.22–5.81)
RDW: 17.3 % — ABNORMAL HIGH (ref 11.5–15.5)
WBC Count: 3.7 10*3/uL — ABNORMAL LOW (ref 4.0–10.5)
nRBC: 0 % (ref 0.0–0.2)

## 2023-07-24 LAB — CMP (CANCER CENTER ONLY)
ALT: 7 U/L (ref 0–44)
AST: 13 U/L — ABNORMAL LOW (ref 15–41)
Albumin: 4.4 g/dL (ref 3.5–5.0)
Alkaline Phosphatase: 52 U/L (ref 38–126)
Anion gap: 7 (ref 5–15)
BUN: 19 mg/dL (ref 8–23)
CO2: 26 mmol/L (ref 22–32)
Calcium: 9.4 mg/dL (ref 8.9–10.3)
Chloride: 106 mmol/L (ref 98–111)
Creatinine: 0.93 mg/dL (ref 0.61–1.24)
GFR, Estimated: >60 mL/min (ref >60)
Glucose, Bld: 102 mg/dL — ABNORMAL HIGH (ref 70–99)
Potassium: 4.6 mmol/L (ref 3.5–5.1)
Sodium: 139 mmol/L (ref 135–145)
Total Bilirubin: 0.9 mg/dL (ref 0.3–1.2)
Total Protein: 6.7 g/dL (ref 6.5–8.1)

## 2023-07-26 NOTE — Progress Notes (Unsigned)
Palliative Medicine [facility]  Telephone:([phone] [phone]   Name: Ranvir Renovato Date: 07/26/2023 MRN: [id number]  DOB: Jul 16, 1946  Patient Care Team: Arnette Felts, FNP as PCP - General (General Practice) Pickenpack-Cousar, Arty Baumgartner, NP as Nurse Practitioner (Nurse Practitioner) Duffy, Pascal Lux, LCSW as Social Worker (Licensed Clinical Social Worker)    REASON FOR CONSULTATION: Ray Gervasi is a 77 y.o. male with medical history including Parkinson's disease and hyperlipidemia. 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 He is having urgent radiation appointment due to iliac bone pain. Palliative ask to see for symptom management.    SOCIAL HISTORY:     reports that he has never smoked. He has never used smokeless tobacco. He reports current alcohol use of about 15.0 standard drinks of alcohol per week. He reports that he does not use drugs.  ADVANCE DIRECTIVES:  Patient is scheduled to complete advanced directives at our clinic here at the cancer center on 02/16/2022. MOST form provided.  Patient plans to review and complete at a later time.   CODE STATUS:   PAST MEDICAL HISTORY: Past Medical History:  Diagnosis Date   Arthritis    Depression    Family history of melanoma    Family history of prostate cancer    GERD (gastroesophageal reflux disease)    Hyperlipidemia    Hypertension    Parkinson disease    Prostate cancer (HCC)     PAST SURGICAL HISTORY: No past surgical history on file.  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  The report date is December 21, 2022.  The CancerNext-Expanded gene panel offered by [facility] and includes sequencing and rearrangement analysis for the following 77 genes: AIP, ALK, APC*, ATM*, AXIN2, BAP1, BARD1, BLM, BMPR1A, BRCA1*, BRCA2*, BRIP1*, CDC73, CDH1*, CDK4, CDKN1B, CDKN2A, CHEK2*, CTNNA1, DICER1, FANCC, FH, FLCN, GALNT12, KIF1B, LZTR1, MAX, MEN1, MET, MLH1*, MSH2*, MSH3, MSH6*, MUTYH*, NBN, NF1*, NF2, NTHL1, PALB2*, PHOX2B, PMS2*, POT1, PRKAR1A, PTCH1, PTEN*, RAD51C*, RAD51D*, RB1, RECQL, RET, SDHA, SDHAF2, SDHB, SDHC, SDHD, SMAD4, SMARCA4, SMARCB1, SMARCE1, STK11, SUFU, TMEM127, TP53*, TSC1, TSC2, VHL and XRCC2 (sequencing and deletion/duplication); EGFR, EGLN1, HOXB13, KIT, MITF, PDGFRA, POLD1, and POLE (sequencing only); EPCAM and GREM1 (deletion/duplication only). DNA and RNA analyses performed for * genes.      ALLERGIES:  is allergic to gabapentin and celebrex [celecoxib].  MEDICATIONS:  Current Outpatient Medications  Medication Sig Dispense Refill   acetaminophen (TYLENOL) 500 MG tablet Take 1,000 mg by mouth every 6 (six) hours as needed.     amLODipine (NORVASC) 5 MG tablet Take 1 tablet (5 mg total) by mouth daily. 90 tablet 1   atorvastatin (LIPITOR) 40 MG tablet TAKE ONE TABLET BY MOUTH EVERY MORNING 90 tablet 1   calcium-vitamin D (OSCAL WITH D) 500-5 MG-MCG tablet Take 1 tablet by mouth 2 (two) times daily. 60 tablet 3   carbidopa-levodopa (SINEMET) 25-100 MG tablet Take 1  tablet by mouth 5 (five) times daily. 450 tablet 1   COENZYME Q10 PO Take 1 capsule by mouth every morning.     DULoxetine (CYMBALTA) 30 MG capsule Take 1 capsule (30 mg total) by mouth daily. 30 capsule 3   fentaNYL (DURAGESIC) 12 MCG/HR Place 1 patch onto the skin every 3 (three) days. 5 patch 0    fentaNYL (DURAGESIC) 25 MCG/HR Place 1 patch onto the skin every 3 (three) days. 10 patch 0   ibuprofen (ADVIL) 200 MG tablet Take 200 mg by mouth every 6 (six) hours as needed.     KRILL OIL PO Take 1 capsule by mouth every morning.     meloxicam (MOBIC) 7.5 MG tablet Take 1 tablet (7.5 mg total) by mouth daily. 60 tablet 1   multivitamin-lutein (OCUVITE-LUTEIN) CAPS capsule Take 1 capsule by mouth every morning.     ondansetron (ZOFRAN) 8 MG tablet Take 1 tablet (8 mg total) by mouth every 8 (eight) hours as needed for nausea or vomiting. 20 tablet 1   rucaparib camsylate (RUBRACA) 300 MG tablet Take 2 tablets (600 mg total) by mouth 2 (two) times daily. 120 tablet 1   traMADol (ULTRAM) 50 MG tablet Take 1 tablet (50 mg total) by mouth every 6 (six) hours as needed. 30 tablet 0   vitamin B-12 (CYANOCOBALAMIN) 1000 MCG tablet Take 1,000 mcg by mouth every morning.     No current facility-administered medications for this visit.    PERFORMANCE STATUS (ECOG) : 1 - Symptomatic but completely ambulatory  Assessment NAD, ambulatory RRR Normal breathing pattern AAO x4  IMPRESSION: Mr. Wildes presents to clinic today for symptom management follow-up. No acute distress noted. Denies nausea, vomiting, constipation, or diarrhea. Is taking things one day at a time. Appetite is fair. Is trying to remain as active as possible. Husband is present.   Neoplasm related pain  Mr. Freiberger states his pain has improved some increasing his Fentanyl patch however he continues to have pain. Reports pain intensifies on day 2 of his patch specifically within the final 12 hours of when patch is due to be changed. Pain is worst in the evening hours/bedtime.   Reports on several occasions over the weekend pain was so bad he took one of his left over morphine tablets to gain some relief. He did this on two different occasions. Report this was helpful. I discussed importance of not adjusting medications or taking  medications without advisement. He is aware that he is not to take MS Contin in addition to being on his Fentanyl patch. Understands they are both long-acting medication and he is only to utilize one or the other. At this time the decision has been made to continue with patch. Again confirming discontinued use of morphine. Patient and husband verbalized understanding.   We discussed at length [name] concerns and ongoing pain episodes. He has not been taking any breakthrough medication. Unfortunately there was some confusion with use of Tramadol and confusion of similarity of Toradol use. Education provided on use of Tramadol 50-100mg  every 6 hours as needed for breakthrough pain to see if this provide in additional relief along with his patch. He and [name] verbalized understanding.   Discussions had regarding adjusting Fentanyl dose however recommending deferring adjustments to patch at this time as he has not been optimizing all medications specifically breakthrough medication. I shared hesitancy of increasing patch until ability to assess pain levels after utilizing Tramadol over the next week.   We will continue  to closely monitor and support. Patient knows to contact office as needed.   PLAN: Discontinue use of MS Contin  Fentanyl 25 mcg patch.  Will monitor closely and titrate as needed. Patient and husband knows to call office if any concerns obtaining medication.  Tylenol extra strength as needed Meloxicam 7.5 mg twice daily Tramadol 50-100mg  twice daily  Prune juice, tea, Dulcolax daily for bowel regimen  I will see patient back in 2-3 weeks in collaboration to other oncology appointments.  Patient expressed understanding and was in agreement with this plan. He also understands that He can call the clinic at any time with any questions, concerns, or complaints.    Any controlled substances utilized were prescribed in the context of palliative care. PDMP has been reviewed.    Visit  consisted of counseling and education dealing with the complex and emotionally intense issues of symptom management and palliative care in the setting of serious and potentially life-threatening illness.Greater than 50%  of this time was spent counseling and coordinating care related to the above assessment and plan.  Willette Alma, AGPCNP-BC  Palliative Medicine Team/[facility]  *Please note that this is a verbal dictation therefore any spelling or grammatical errors are due to the "[employer] One" system interpretation.

## 2023-07-29 ENCOUNTER — Other Ambulatory Visit: Payer: Self-pay

## 2023-07-29 ENCOUNTER — Encounter: Payer: Self-pay | Admitting: Nurse Practitioner

## 2023-07-29 ENCOUNTER — Inpatient Hospital Stay: Payer: Medicare Other | Attending: Nurse Practitioner

## 2023-07-29 ENCOUNTER — Inpatient Hospital Stay (HOSPITAL_BASED_OUTPATIENT_CLINIC_OR_DEPARTMENT_OTHER): Payer: Medicare Other | Admitting: Nurse Practitioner

## 2023-07-29 VITALS — BP 118/60 | HR 70 | Temp 98.8°F | Resp 17 | Wt 174.0 lb

## 2023-07-29 DIAGNOSIS — Z79899 Other long term (current) drug therapy: Secondary | ICD-10-CM | POA: Diagnosis not present

## 2023-07-29 DIAGNOSIS — Z923 Personal history of irradiation: Secondary | ICD-10-CM | POA: Diagnosis not present

## 2023-07-29 DIAGNOSIS — C7951 Secondary malignant neoplasm of bone: Secondary | ICD-10-CM | POA: Diagnosis present

## 2023-07-29 DIAGNOSIS — Z515 Encounter for palliative care: Secondary | ICD-10-CM

## 2023-07-29 DIAGNOSIS — G893 Neoplasm related pain (acute) (chronic): Secondary | ICD-10-CM | POA: Diagnosis not present

## 2023-07-29 DIAGNOSIS — C61 Malignant neoplasm of prostate: Secondary | ICD-10-CM

## 2023-07-29 DIAGNOSIS — D649 Anemia, unspecified: Secondary | ICD-10-CM | POA: Insufficient documentation

## 2023-07-29 DIAGNOSIS — C7800 Secondary malignant neoplasm of unspecified lung: Secondary | ICD-10-CM | POA: Insufficient documentation

## 2023-07-29 LAB — CMP (CANCER CENTER ONLY)
ALT: 6 U/L (ref 0–44)
AST: 14 U/L — ABNORMAL LOW (ref 15–41)
Albumin: 4.4 g/dL (ref 3.5–5.0)
Alkaline Phosphatase: 54 U/L (ref 38–126)
Anion gap: 5 (ref 5–15)
BUN: 21 mg/dL (ref 8–23)
CO2: 28 mmol/L (ref 22–32)
Calcium: 9.1 mg/dL (ref 8.9–10.3)
Chloride: 106 mmol/L (ref 98–111)
Creatinine: 0.88 mg/dL (ref 0.61–1.24)
GFR, Estimated: >60 mL/min (ref >60)
Glucose, Bld: 99 mg/dL (ref 70–99)
Potassium: 5.1 mmol/L (ref 3.5–5.1)
Sodium: 139 mmol/L (ref 135–145)
Total Bilirubin: 1 mg/dL (ref 0.3–1.2)
Total Protein: 6.7 g/dL (ref 6.5–8.1)

## 2023-07-29 LAB — CBC WITH DIFFERENTIAL (CANCER CENTER ONLY)
Abs Immature Granulocytes: 0.01 10*3/uL (ref 0.00–0.07)
Basophils Absolute: 0 10*3/uL (ref 0.0–0.1)
Basophils Relative: 1 %
Eosinophils Absolute: 0.1 10*3/uL (ref 0.0–0.5)
Eosinophils Relative: 2 %
HCT: 27.7 % — ABNORMAL LOW (ref 39.0–52.0)
Hemoglobin: 9.4 g/dL — ABNORMAL LOW (ref 13.0–17.0)
Immature Granulocytes: 0 %
Lymphocytes Relative: 30 %
Lymphs Abs: 1.1 10*3/uL (ref 0.7–4.0)
MCH: 35.3 pg — ABNORMAL HIGH (ref 26.0–34.0)
MCHC: 33.9 g/dL (ref 30.0–36.0)
MCV: 104.1 fL — ABNORMAL HIGH (ref 80.0–100.0)
Monocytes Absolute: 0.5 10*3/uL (ref 0.1–1.0)
Monocytes Relative: 13 %
Neutro Abs: 1.9 10*3/uL (ref 1.7–7.7)
Neutrophils Relative %: 54 %
Platelet Count: 202 10*3/uL (ref 150–400)
RBC: 2.66 MIL/uL — ABNORMAL LOW (ref 4.22–5.81)
RDW: 18.2 % — ABNORMAL HIGH (ref 11.5–15.5)
WBC Count: 3.6 10*3/uL — ABNORMAL LOW (ref 4.0–10.5)
nRBC: 0 % (ref 0.0–0.2)

## 2023-07-30 ENCOUNTER — Other Ambulatory Visit: Payer: Self-pay | Admitting: Physician Assistant

## 2023-07-30 DIAGNOSIS — C61 Malignant neoplasm of prostate: Secondary | ICD-10-CM

## 2023-07-31 ENCOUNTER — Inpatient Hospital Stay (HOSPITAL_BASED_OUTPATIENT_CLINIC_OR_DEPARTMENT_OTHER): Payer: Medicare Other | Admitting: Physician Assistant

## 2023-07-31 ENCOUNTER — Inpatient Hospital Stay: Payer: Medicare Other

## 2023-07-31 ENCOUNTER — Other Ambulatory Visit: Payer: Self-pay

## 2023-07-31 ENCOUNTER — Inpatient Hospital Stay: Payer: Medicare Other | Admitting: Physician Assistant

## 2023-07-31 VITALS — BP 134/64 | HR 81 | Temp 98.2°F | Resp 17 | Wt 172.4 lb

## 2023-07-31 DIAGNOSIS — C61 Malignant neoplasm of prostate: Secondary | ICD-10-CM

## 2023-07-31 DIAGNOSIS — C7951 Secondary malignant neoplasm of bone: Secondary | ICD-10-CM

## 2023-07-31 MED ORDER — DENOSUMAB 120 MG/1.7ML ~~LOC~~ SOLN
120.0000 mg | Freq: Once | SUBCUTANEOUS | Status: AC
  Administered 2023-07-31: 120 mg via SUBCUTANEOUS
  Filled 2023-07-31: qty 1.7

## 2023-07-31 NOTE — Progress Notes (Unsigned)
[facility] [phone]   [phone]  PROGRESS NOTE  Patient Care Team: Arnette Felts, FNP as PCP - General (General Practice) Pickenpack-Cousar, Arty Baumgartner, NP as Nurse Practitioner (Nurse Practitioner) Alisa Graff, Pascal Lux, LCSW as Social Worker (Licensed Clinical Social Worker)  Hematological/Oncological History # Metastatic castrate sensitive prostate cancer. 01/15/22: CT-guided biopsy of a pulmonary nodule confirmed the presence of prostate cancer 01/23/2022:  Firmagon 240 mg started  Feb 2023: radiation therapy to the left sacroiliac pelvic bones completed. He received 30 Gray in 10 fractions  03/06/2022: Abiraterone 1000 mg daily with prednisone 5 mg daily started  01/03/2023: last visit with Dr. Clelia Croft. Restarted 500 mg abiraterone daily.  02/06/2023: transfer care to Dr. Leonides Schanz. Increased dose to 1000 mg abiraterone daily.  04/11/2023: Due to increasing PSA transition to rucaparib 600 mg twice daily 07/03/2023: Held Rucuaparib due to neutropenia and anemia.   Interval History:  Frederick Ponce 77 y.o. 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 The patient's last visit was on 07/01/2023.   On exam today Frederick Ponce reports he has been well overall in the interim since our last visit.  He notes that the drop in his energy levels were "no big surprise".  He notes he feels wiped out at baseline has not noticed feeling any worse.  He is concerned that his fentanyl patches are not helping him as much as he would like.  He did feel better after receiving his blood transfusion.  His primary issue at this time is his left leg pain.  He reports it predominantly occurs at night and is associated with "jitters and restless leg".  He has taken gabapentin and Lyrica in the past with no relief.  He was hoping the fentanyl would help but he is not getting much relief there.  He  reports that he has been eating well and his GI problems are resolving.  He is not having as much in the way of constipation since she switched off of morphine.  He reports he does have a persistent runny nose but no cough or sore throat.Frederick KitchenHe denies fevers, chills, sweats,chest pain or cough.  A full 10 point ROS was otherwise negative.  At this time he was agreeable to holding his Rucuaparib and continuing weekly monitoring of his blood counts with return to clinic in 2 week's time.   MEDICAL HISTORY:  Past Medical History:  Diagnosis Date   Arthritis    Depression    Family history of melanoma    Family history of prostate cancer    GERD (gastroesophageal reflux disease)    Hyperlipidemia    Hypertension    Parkinson disease    Prostate cancer (HCC)     SURGICAL HISTORY: No past surgical history on file.  SOCIAL HISTORY: Social History   Socioeconomic History   Marital status: Married    Spouse name: Frederick Ponce   Number of children: 0   Years of education: Not on file   Highest education level: Associate degree: academic program  Occupational History   Not on file  Tobacco Use   Smoking status: Never   Smokeless tobacco: Never  Vaping Use   Vaping status: Never Used  Substance and Sexual Activity   Alcohol use: Yes    Alcohol/week: 15.0 standard drinks of alcohol    Types: 15 Glasses of wine per week    Comment: couple glasses a  night   Drug use: Never   Sexual activity: Not Currently    Birth control/protection: None  Other Topics Concern   Not on file  Social History Narrative   Not on file   Social Determinants of Health   Financial Resource Strain: Low Risk  (01/03/2023)   Overall Financial Resource Strain (CARDIA)    Difficulty of Paying Living Expenses: Not hard at all  Food Insecurity: No Food Insecurity (01/03/2023)   Hunger Vital Sign    Worried About Running Out of Food in the Last Year: Never true    Ran Out of Food in the Last Year: Never true   Transportation Needs: No Transportation Needs (01/03/2023)   PRAPARE - Administrator, Civil Service (Medical): No    Lack of Transportation (Non-Medical): No  Physical Activity: Inactive (01/03/2023)   Exercise Vital Sign    Days of Exercise per Week: 0 days    Minutes of Exercise per Session: 0 min  Stress: No Stress Concern Present (01/03/2023)   [employer] of Occupational Health - Occupational Stress Questionnaire    Feeling of Stress : Not at all  Social Connections: Unknown (08/03/2022)   Received from [facility]   Social Network    Social Network: Not on file  Intimate Partner Violence: Not At Risk (10/12/2022)   Humiliation, Afraid, Rape, and Kick questionnaire    Fear of Current or Ex-Partner: No    Emotionally Abused: No    Physically Abused: No    Sexually Abused: No    FAMILY HISTORY: Family History  Problem Relation Age of Onset   Kidney disease Mother    Colon cancer Mother    Heart disease Father    Tremor Father    Parkinson's disease Father    Prostate cancer Father    Melanoma Sister    Parkinson's disease Sister    Melanoma Sister        melanoma x2   Prostate cancer Brother    Parkinson's disease Paternal Grandmother    Lung cancer Paternal Grandfather    Parkinson's disease Paternal Aunt    Lung cancer Paternal Aunt     ALLERGIES:  is allergic to gabapentin and celebrex [celecoxib].  MEDICATIONS:  Current Outpatient Medications  Medication Sig Dispense Refill   acetaminophen (TYLENOL) 500 MG tablet Take 1,000 mg by mouth every 6 (six) hours as needed.     amLODipine (NORVASC) 5 MG tablet Take 1 tablet (5 mg total) by mouth daily. 90 tablet 1   atorvastatin (LIPITOR) 40 MG tablet TAKE ONE TABLET BY MOUTH EVERY MORNING 90 tablet 1   calcium-vitamin D (OSCAL WITH D) 500-5 MG-MCG tablet Take 1 tablet by mouth 2 (two) times daily. 60 tablet 3   carbidopa-levodopa (SINEMET) 25-100 MG tablet Take 1 tablet by mouth 5  (five) times daily. 450 tablet 1   COENZYME Q10 PO Take 1 capsule by mouth every morning.     DULoxetine (CYMBALTA) 30 MG capsule Take 1 capsule (30 mg total) by mouth daily. 30 capsule 3   fentaNYL (DURAGESIC) 12 MCG/HR Place 1 patch onto the skin every 3 (three) days. 5 patch 0   fentaNYL (DURAGESIC) 25 MCG/HR Place 1 patch onto the skin every 3 (three) days. 10 patch 0   ibuprofen (ADVIL) 200 MG tablet Take 200 mg by mouth every 6 (six) hours as needed.     KRILL OIL PO Take 1 capsule by mouth every morning.     meloxicam (  MOBIC) 7.5 MG tablet Take 1 tablet (7.5 mg total) by mouth daily. 60 tablet 1   multivitamin-lutein (OCUVITE-LUTEIN) CAPS capsule Take 1 capsule by mouth every morning.     ondansetron (ZOFRAN) 8 MG tablet Take 1 tablet (8 mg total) by mouth every 8 (eight) hours as needed for nausea or vomiting. 20 tablet 1   rucaparib camsylate (RUBRACA) 300 MG tablet Take 2 tablets (600 mg total) by mouth 2 (two) times daily. 120 tablet 1   traMADol (ULTRAM) 50 MG tablet Take 1 tablet (50 mg total) by mouth every 6 (six) hours as needed. 30 tablet 0   vitamin B-12 (CYANOCOBALAMIN) 1000 MCG tablet Take 1,000 mcg by mouth every morning.     No current facility-administered medications for this visit.    REVIEW OF SYSTEMS:   Constitutional: ( - ) fevers, ( - )  chills , ( - ) night sweats Eyes: ( - ) blurriness of vision, ( - ) double vision, ( - ) watery eyes Ears, nose, mouth, throat, and face: ( - ) mucositis, ( - ) sore throat Respiratory: ( - ) cough, ( - ) dyspnea, ( - ) wheezes Cardiovascular: ( - ) palpitation, ( - ) chest discomfort, ( - ) lower extremity swelling Gastrointestinal:  ( - ) nausea, ( - ) heartburn, ( - ) change in bowel habits Skin: ( - ) abnormal skin rashes Lymphatics: ( - ) new lymphadenopathy, ( - ) easy bruising Neurological: ( - ) numbness, ( - ) tingling, ( - ) new weaknesses Behavioral/Psych: ( - ) mood change, ( - ) new changes  All other systems were  reviewed with the patient and are negative.  PHYSICAL EXAMINATION: ECOG PERFORMANCE STATUS: 1 - Symptomatic but completely ambulatory  Vitals:   07/31/23 1459  BP: 134/64  Pulse: 81  Resp: 17  Temp: 98.2 F (36.8 C)  SpO2: 98%       Filed Weights   07/31/23 1459  Weight: 172 lb 6.4 oz (78.2 kg)    GENERAL: Well-appearing elderly Caucasian male, alert, no distress and comfortable SKIN: skin color, texture, turgor are normal, no rashes or significant lesions EYES: conjunctiva are pink and non-injected, sclera clear LUNGS: clear to auscultation and percussion with normal breathing effort HEART: regular rate & rhythm and no murmurs and no lower extremity edema Musculoskeletal: no cyanosis of digits and no clubbing  PSYCH: alert & oriented x 3, fluent speech NEURO: no focal motor/sensory deficits  LABORATORY DATA:  I have reviewed the data as listed    Latest Ref Rng & Units 07/29/2023    9:51 AM 07/24/2023    9:32 AM 07/16/2023    9:21 AM  CBC  WBC 4.0 - 10.5 K/uL 3.6  3.7  2.5   Hemoglobin 13.0 - 17.0 g/dL 9.4  9.3  9.1   Hematocrit 39.0 - 52.0 % 27.7  27.5  27.2   Platelets 150 - 400 K/uL 202  213  211        Latest Ref Rng & Units 07/29/2023    9:51 AM 07/24/2023    9:32 AM 07/16/2023    9:21 AM  CMP  Glucose 70 - 99 mg/dL 99  981  92   BUN 8 - 23 mg/dL 21  19  21    Creatinine 0.61 - 1.24 mg/dL 1.91  4.78  2.95   Sodium 135 - 145 mmol/L 139  139  139   Potassium 3.5 - 5.1 mmol/L 5.1  4.6  4.6   Chloride 98 - 111 mmol/L 106  106  108   CO2 22 - 32 mmol/L 28  26  25    Calcium 8.9 - 10.3 mg/dL 9.1  9.4  9.4   Total Protein 6.5 - 8.1 g/dL 6.7  6.7  6.3   Total Bilirubin 0.3 - 1.2 mg/dL 1.0  0.9  0.8   Alkaline Phos 38 - 126 U/L 54  52  52   AST 15 - 41 U/L 14  13  16    ALT 0 - 44 U/L 6  7  10      Lab Results  Component Value Date   MPROTEIN Not Observed 12/27/2021   Lab Results  Component Value Date   KPAFRELGTCHN 16.0 12/27/2021   LAMBDASER 12.2  12/27/2021   KAPLAMBRATIO 1.31 12/27/2021     RADIOGRAPHIC STUDIES: CT CHEST ABDOMEN PELVIS W CONTRAST  Result Date: 07/20/2023 CLINICAL DATA:  Metastatic prostate cancer, assess treatment response * Tracking Code: BO * EXAM: CT CHEST, ABDOMEN, AND PELVIS WITH CONTRAST TECHNIQUE: Multidetector CT imaging of the chest, abdomen and pelvis was performed following the standard protocol during bolus administration of intravenous contrast. RADIATION DOSE REDUCTION: This exam was performed according to the departmental dose-optimization program which includes automated exposure control, adjustment of the mA and/or kV according to patient size and/or use of iterative reconstruction technique. CONTRAST:  OMNIPAQUE IOHEXOL 300 MG/ML  SOLN COMPARISON:  02/20/2023 FINDINGS: CT CHEST FINDINGS Cardiovascular: Aortic atherosclerosis. Normal heart size. Three-vessel coronary artery calcifications. No pericardial effusion. Mediastinum/Nodes: No enlarged mediastinal, hilar, or axillary lymph nodes. Thyroid gland, trachea, and esophagus demonstrate no significant findings. Lungs/Pleura: Interval decrease in size of a spiculated nodule of the anterior left upper lobe, measuring 0.9 x 0.7 cm, previously 1.7 x 1.5 cm (series 4, image 71). Interval decrease in size of a spiculated nodule of the dependent left lung base measuring 1.7 x 1.6 cm, previously 2.7 x 2.1 cm (series 4, image 98). No pleural effusion or pneumothorax. Musculoskeletal: No chest wall abnormality. No acute osseous findings. Disc degenerative disease and bridging osteophytosis throughout the thoracic and upper lumbar spine, in keeping with DISH. CT ABDOMEN PELVIS FINDINGS Hepatobiliary: No solid liver abnormality is seen. Unchanged, benign hemangiomata of the right lobe of the liver, requiring no specific further follow-up or characterization. No gallstones, gallbladder wall thickening, or biliary dilatation. Pancreas: Unremarkable. No pancreatic ductal  dilatation or surrounding inflammatory changes. Spleen: Normal in size without significant abnormality. Adrenals/Urinary Tract: Adrenal glands are unremarkable. Unchanged severe right hydronephrosis with abrupt caliber change at the ureteropelvic junction consistent with chronic ureteropelvic junction stricture. Multiple bilateral renal calculi. No left-sided hydronephrosis nor hydroureter. Thickening of the urinary bladder wall, likely secondary to chronic outlet obstruction. Stomach/Bowel: Stomach is within normal limits. Appendix appears normal. No evidence of bowel wall thickening, distention, or inflammatory changes. Vascular/Lymphatic: Aortic atherosclerosis. No enlarged abdominal or pelvic lymph nodes. Reproductive: No mass or other abnormality. Other: Large right inguinal hernia containing fat and sigmoid colon, incompletely imaged (series 2, image 114). No ascites. Musculoskeletal: No acute osseous findings. Unchanged sclerotic osseous metastatic disease involving the proximal right humerus, ribs, vertebral bodies, and bony pelvis. IMPRESSION: 1. Interval decrease in size of spiculated nodules of the anterior left upper lobe and dependent left lung base, consistent with treatment response of pulmonary metastatic disease. 2. Unchanged sclerotic osseous metastatic disease involving the proximal right humerus, ribs, vertebral bodies, and bony pelvis. 3. No evidence of lymphadenopathy or new metastatic disease in the chest, abdomen,  or pelvis. 4. Unchanged severe right hydronephrosis with abrupt caliber change at the ureteropelvic junction consistent with chronic ureteropelvic junction stricture. 5. Multiple bilateral renal calculi. 6. Large right inguinal hernia containing fat and sigmoid colon, incompletely imaged. 7. Coronary artery disease. Aortic Atherosclerosis (ICD10-I70.0). Electronically Signed   By: Jearld Lesch M.D.   On: 07/20/2023 15:09    ASSESSMENT & PLAN Frederick Ponce is a 77 y.o. 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  # Metastatic castrate sensitive prostate cancer. 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 WBC 3.6, ANC 1.9, hemoglobin 9.4, MCV 104.1, and platelets of 202. Creatinine and LFTs in range. PSA normal at 1.1. --Okay to resume Rucaparib at 300 mg p.o. twice daily. --Last dose of Eligard on 04/19/2023. Next dose in 4 months (August 2024).  --Proceed with Xgeva injection monthly. Next one due today.    No orders of the defined types were placed in this encounter.   All questions were answered. The patient knows to call the clinic with any problems, questions or concerns.  I have spent a total of 30 minutes minutes of face-to-face and non-face-to-face time, preparing to see the patient, performing a medically appropriate examination, counseling and educating the patient, documenting clinical information in the electronic health record, independently interpreting results and communicating results to the patient, and care coordination.   Georga Kaufmann PA-C Dept of Hematology and Oncology [facility] Phone: [phone]  07/31/2023 3:09 PM

## 2023-08-01 ENCOUNTER — Encounter: Payer: Self-pay | Admitting: Hematology and Oncology

## 2023-08-08 ENCOUNTER — Other Ambulatory Visit: Payer: Self-pay | Admitting: Nurse Practitioner

## 2023-08-08 DIAGNOSIS — I10 Essential (primary) hypertension: Secondary | ICD-10-CM

## 2023-08-08 DIAGNOSIS — C61 Malignant neoplasm of prostate: Secondary | ICD-10-CM

## 2023-08-08 DIAGNOSIS — G893 Neoplasm related pain (acute) (chronic): Secondary | ICD-10-CM

## 2023-08-08 DIAGNOSIS — Z515 Encounter for palliative care: Secondary | ICD-10-CM

## 2023-08-08 MED ORDER — FENTANYL 25 MCG/HR TD PT72
1.0000 | MEDICATED_PATCH | TRANSDERMAL | 0 refills | Status: DC
Start: 2023-08-08 — End: 2023-09-05

## 2023-08-09 ENCOUNTER — Ambulatory Visit (INDEPENDENT_AMBULATORY_CARE_PROVIDER_SITE_OTHER): Payer: Medicare Other | Admitting: Nurse Practitioner

## 2023-08-09 VITALS — BP 110/70 | HR 82 | Temp 98.3°F | Ht 68.0 in | Wt 171.4 lb

## 2023-08-09 DIAGNOSIS — G629 Polyneuropathy, unspecified: Secondary | ICD-10-CM | POA: Diagnosis not present

## 2023-08-09 DIAGNOSIS — G20A1 Parkinson's disease without dyskinesia, without mention of fluctuations: Secondary | ICD-10-CM

## 2023-08-09 DIAGNOSIS — C61 Malignant neoplasm of prostate: Secondary | ICD-10-CM

## 2023-08-09 DIAGNOSIS — Z6826 Body mass index (BMI) 26.0-26.9, adult: Secondary | ICD-10-CM

## 2023-08-09 DIAGNOSIS — I10 Essential (primary) hypertension: Secondary | ICD-10-CM

## 2023-08-09 DIAGNOSIS — E785 Hyperlipidemia, unspecified: Secondary | ICD-10-CM | POA: Diagnosis not present

## 2023-08-09 DIAGNOSIS — E663 Overweight: Secondary | ICD-10-CM

## 2023-08-09 DIAGNOSIS — C7951 Secondary malignant neoplasm of bone: Secondary | ICD-10-CM

## 2023-08-09 MED ORDER — AMLODIPINE BESYLATE 5 MG PO TABS
5.0000 mg | ORAL_TABLET | Freq: Every day | ORAL | 1 refills | Status: DC
Start: 2023-08-09 — End: 2024-06-17

## 2023-08-09 NOTE — Assessment & Plan Note (Signed)
Continue follow-up with oncology 

## 2023-08-09 NOTE — Assessment & Plan Note (Signed)
Cholesterol levels are stable, continue statin tolerating well.

## 2023-08-09 NOTE — Assessment & Plan Note (Addendum)
Blood pressure is well-controlled.  Continue current medications.  EGFR has been checked recently and is normal

## 2023-08-09 NOTE — Assessment & Plan Note (Signed)
Continue follow-up with neurology. 

## 2023-08-09 NOTE — Progress Notes (Signed)
Madelaine Bhat, CMA,acting as a Neurosurgeon for Arnette Felts, FNP.,have documented all relevant documentation on the behalf of Arnette Felts, FNP,as directed by  Arnette Felts, FNP while in the presence of Arnette Felts, FNP.  Subjective:  Patient ID: Frederick Ponce , male    DOB: 1946-11-07 , 77 y.o.   MRN: [id number]  Chief Complaint  Patient presents with   Hypertension    HPI  Patient presents today for a BP and Chol follow up. Patient reports compliance with medications, patient denies any headache, chest pain or SOB. Patient has no other concerns today. He has had to have a blood transfusion and he is on a 1/2 dose of chemo due to red and white blood cells crashed. He is allergic to gabapentin (confusion) and lyrica -  he does not sleep well. He has to wear his socks to bed due to feet feeling "so cold". He is on the fentanyl patch.   BP Readings from Last 3 Encounters: 08/09/23 : 110/70 07/31/23 : 134/64 07/29/23 : 118/60       Past Medical History:  Diagnosis Date   Arthritis    Depression    Family history of melanoma    Family history of prostate cancer    GERD (gastroesophageal reflux disease)    Hyperlipidemia    Hypertension    Parkinson disease    Prostate cancer (HCC)      Family History  Problem Relation Age of Onset   Kidney disease Mother    Colon cancer Mother    Heart disease Father    Tremor Father    Parkinson's disease Father    Prostate cancer Father    Melanoma Sister    Parkinson's disease Sister    Melanoma Sister        melanoma x2   Prostate cancer Brother    Parkinson's disease Paternal Grandmother    Lung cancer Paternal Grandfather    Parkinson's disease Paternal Aunt    Lung cancer Paternal Aunt      Current Outpatient Medications:    acetaminophen (TYLENOL) 500 MG tablet, Take 1,000 mg by mouth every 6 (six) hours as needed., Disp: , Rfl:    atorvastatin (LIPITOR) 40 MG tablet, TAKE ONE TABLET BY MOUTH EVERY MORNING, Disp: 90  tablet, Rfl: 1   calcium-vitamin D (OSCAL WITH D) 500-5 MG-MCG tablet, Take 1 tablet by mouth 2 (two) times daily., Disp: 60 tablet, Rfl: 3   carbidopa-levodopa (SINEMET) 25-100 MG tablet, Take 1 tablet by mouth 5 (five) times daily., Disp: 450 tablet, Rfl: 1   COENZYME Q10 PO, Take 1 capsule by mouth every morning., Disp: , Rfl:    DULoxetine (CYMBALTA) 30 MG capsule, Take 1 capsule (30 mg total) by mouth daily., Disp: 30 capsule, Rfl: 3   fentaNYL (DURAGESIC) 25 MCG/HR, Place 1 patch onto the skin every 3 (three) days., Disp: 10 patch, Rfl: 0   ibuprofen (ADVIL) 200 MG tablet, Take 200 mg by mouth every 6 (six) hours as needed., Disp: , Rfl:    KRILL OIL PO, Take 1 capsule by mouth every morning., Disp: , Rfl:    meloxicam (MOBIC) 7.5 MG tablet, Take 1 tablet (7.5 mg total) by mouth daily., Disp: 60 tablet, Rfl: 1   multivitamin-lutein (OCUVITE-LUTEIN) CAPS capsule, Take 1 capsule by mouth every morning., Disp: , Rfl:    ondansetron (ZOFRAN) 8 MG tablet, Take 1 tablet (8 mg total) by mouth every 8 (eight) hours as needed for nausea or vomiting., Disp:  20 tablet, Rfl: 1   traMADol (ULTRAM) 50 MG tablet, Take 1 tablet (50 mg total) by mouth every 6 (six) hours as needed., Disp: 30 tablet, Rfl: 0   vitamin B-12 (CYANOCOBALAMIN) 1000 MCG tablet, Take 1,000 mcg by mouth every morning., Disp: , Rfl:    amLODipine (NORVASC) 5 MG tablet, Take 1 tablet (5 mg total) by mouth daily., Disp: 90 tablet, Rfl: 1   rucaparib camsylate (RUBRACA) 300 MG tablet, Take 2 tablets (600 mg total) by mouth 2 (two) times daily. (Patient not taking: Reported on 07/31/2023), Disp: 120 tablet, Rfl: 1   Allergies  Allergen Reactions   Gabapentin Other (See Comments)    Restless legs/confusion   Celebrex [Celecoxib] Rash     Review of Systems  Constitutional: Negative.   HENT: Negative.    Eyes: Negative.   Respiratory: Negative.    Cardiovascular: Negative.   Gastrointestinal: Negative.   Neurological: Negative.    Psychiatric/Behavioral: Negative.       Today's Vitals   08/09/23 0923  BP: 110/70  Pulse: 82  Temp: 98.3 F (36.8 C)  TempSrc: Oral  Weight: 171 lb 6.4 oz (77.7 kg)  Height: 5\' 8"  (1.727 m)  PainSc: 7   PainLoc: Leg   Body mass index is 26.06 kg/m.  Wt Readings from Last 3 Encounters:  08/09/23 171 lb 6.4 oz (77.7 kg)  07/31/23 172 lb 6.4 oz (78.2 kg)  07/29/23 174 lb (78.9 kg)      Objective:  Physical Exam Vitals reviewed.  Constitutional:      General: He is not in acute distress.    Appearance: Normal appearance.  Cardiovascular:     Pulses: Normal pulses.     Heart sounds: Normal heart sounds. No murmur heard. Pulmonary:     Effort: Pulmonary effort is normal. No respiratory distress.     Breath sounds: Normal breath sounds. No wheezing.  Skin:    General: Skin is warm and dry.     Capillary Refill: Capillary refill takes less than 2 seconds.  Neurological:     General: No focal deficit present.     Mental Status: He is alert and oriented to person, place, and time.     Cranial Nerves: No cranial nerve deficit.     Motor: No weakness.  Psychiatric:        Mood and Affect: Mood normal.        Behavior: Behavior normal.        Thought Content: Thought content normal.        Judgment: Judgment normal.         Assessment And Plan:  Hyperlipidemia, unspecified hyperlipidemia type Assessment & Plan: Cholesterol levels are stable, continue statin tolerating well.  Orders: -     Lipid panel  Essential hypertension Assessment & Plan: Blood pressure is well-controlled.  Continue current medications.  EGFR has been checked recently and is normal  Orders: -     amLODIPine Besylate; Take 1 tablet (5 mg total) by mouth daily.  Dispense: 90 tablet; Refill: 1  Parkinson's disease, unspecified whether dyskinesia present, unspecified whether manifestations fluctuate Assessment & Plan: Continue follow-up with neurology   Polyneuropathy Assessment &  Plan: Unable to tolerate Lyrica and gabapentin.  Encouraged to consider neuropathy pain cream.  Continue other pain medications given by oncology office   Prostate 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 [facility]) Assessment & Plan: Continue follow-up with oncology.   Overweight with body mass index (BMI) of 26 to 26.9 in adult  Return for 6 month bp check.  Patient was given opportunity to ask questions. Patient verbalized understanding of the plan and was able to repeat key elements of the plan. All questions were answered to their satisfaction.    Jeanell Sparrow, FNP, have reviewed all documentation for this visit. The documentation on 08/09/23 for the exam, diagnosis, procedures, and orders are all accurate and complete.   IF YOU HAVE BEEN REFERRED TO A SPECIALIST, IT MAY TAKE 1-2 WEEKS TO SCHEDULE/PROCESS THE REFERRAL. IF YOU HAVE NOT HEARD FROM US/SPECIALIST IN TWO WEEKS, PLEASE GIVE [place] A CALL AT [phone].

## 2023-08-09 NOTE — Assessment & Plan Note (Signed)
Unable to tolerate Lyrica and gabapentin.  Encouraged to consider neuropathy pain cream.  Continue other pain medications given by oncology office

## 2023-08-13 ENCOUNTER — Other Ambulatory Visit: Payer: Self-pay | Admitting: Physician Assistant

## 2023-08-15 ENCOUNTER — Telehealth: Payer: Self-pay | Admitting: Hematology and Oncology

## 2023-08-16 ENCOUNTER — Other Ambulatory Visit: Payer: Self-pay

## 2023-08-16 ENCOUNTER — Inpatient Hospital Stay: Payer: Medicare Other

## 2023-08-16 DIAGNOSIS — C61 Malignant neoplasm of prostate: Secondary | ICD-10-CM | POA: Diagnosis not present

## 2023-08-16 LAB — CMP (CANCER CENTER ONLY)
ALT: 7 U/L (ref 0–44)
AST: 17 U/L (ref 15–41)
Albumin: 4.4 g/dL (ref 3.5–5.0)
Alkaline Phosphatase: 51 U/L (ref 38–126)
Anion gap: 6 (ref 5–15)
BUN: 20 mg/dL (ref 8–23)
CO2: 27 mmol/L (ref 22–32)
Calcium: 9.4 mg/dL (ref 8.9–10.3)
Chloride: 106 mmol/L (ref 98–111)
Creatinine: 0.96 mg/dL (ref 0.61–1.24)
GFR, Estimated: >60 mL/min (ref >60)
Glucose, Bld: 126 mg/dL — ABNORMAL HIGH (ref 70–99)
Potassium: 4.3 mmol/L (ref 3.5–5.1)
Sodium: 139 mmol/L (ref 135–145)
Total Bilirubin: 1.2 mg/dL (ref 0.3–1.2)
Total Protein: 6.8 g/dL (ref 6.5–8.1)

## 2023-08-16 LAB — CBC WITH DIFFERENTIAL (CANCER CENTER ONLY)
Abs Immature Granulocytes: 0.01 10*3/uL (ref 0.00–0.07)
Basophils Absolute: 0 10*3/uL (ref 0.0–0.1)
Basophils Relative: 1 %
Eosinophils Absolute: 0.2 10*3/uL (ref 0.0–0.5)
Eosinophils Relative: 5 %
HCT: 30.4 % — ABNORMAL LOW (ref 39.0–52.0)
Hemoglobin: 9.9 g/dL — ABNORMAL LOW (ref 13.0–17.0)
Immature Granulocytes: 0 %
Lymphocytes Relative: 25 %
Lymphs Abs: 1.1 10*3/uL (ref 0.7–4.0)
MCH: 35.5 pg — ABNORMAL HIGH (ref 26.0–34.0)
MCHC: 32.6 g/dL (ref 30.0–36.0)
MCV: 109 fL — ABNORMAL HIGH (ref 80.0–100.0)
Monocytes Absolute: 0.4 10*3/uL (ref 0.1–1.0)
Monocytes Relative: 9 %
Neutro Abs: 2.6 10*3/uL (ref 1.7–7.7)
Neutrophils Relative %: 60 %
Platelet Count: 161 10*3/uL (ref 150–400)
RBC: 2.79 MIL/uL — ABNORMAL LOW (ref 4.22–5.81)
RDW: 17 % — ABNORMAL HIGH (ref 11.5–15.5)
WBC Count: 4.3 10*3/uL (ref 4.0–10.5)
nRBC: 0 % (ref 0.0–0.2)

## 2023-08-17 LAB — PROSTATE-SPECIFIC AG, SERUM (LABCORP): Prostate Specific Ag, Serum: 0.9 ng/mL (ref 0.0–4.0)

## 2023-08-19 ENCOUNTER — Ambulatory Visit: Payer: Medicare Other

## 2023-08-19 ENCOUNTER — Inpatient Hospital Stay: Payer: Medicare Other

## 2023-08-19 VITALS — BP 132/65 | HR 74 | Temp 98.6°F | Resp 16

## 2023-08-19 DIAGNOSIS — C61 Malignant neoplasm of prostate: Secondary | ICD-10-CM

## 2023-08-19 MED ORDER — LEUPROLIDE ACETATE (4 MONTH) 30 MG ~~LOC~~ KIT
30.0000 mg | PACK | SUBCUTANEOUS | Status: DC
  Administered 2023-08-19: 30 mg via SUBCUTANEOUS
  Filled 2023-08-19 (×2): qty 30

## 2023-08-20 ENCOUNTER — Other Ambulatory Visit: Payer: Self-pay | Admitting: *Deleted

## 2023-08-20 DIAGNOSIS — C61 Malignant neoplasm of prostate: Secondary | ICD-10-CM

## 2023-08-20 MED ORDER — RUCAPARIB CAMSYLATE 300 MG PO TABS
600.0000 mg | ORAL_TABLET | Freq: Two times a day (BID) | ORAL | 1 refills | Status: AC
Start: 2023-08-20 — End: ?

## 2023-08-22 ENCOUNTER — Other Ambulatory Visit: Payer: Self-pay | Admitting: *Deleted

## 2023-08-22 DIAGNOSIS — C61 Malignant neoplasm of prostate: Secondary | ICD-10-CM

## 2023-08-22 MED ORDER — RUCAPARIB CAMSYLATE 300 MG PO TABS
600.0000 mg | ORAL_TABLET | Freq: Two times a day (BID) | ORAL | 1 refills | Status: DC
Start: 2023-08-22 — End: 2023-08-27

## 2023-08-27 ENCOUNTER — Telehealth: Payer: Self-pay | Admitting: Hematology and Oncology

## 2023-08-27 ENCOUNTER — Inpatient Hospital Stay: Payer: Medicare Other

## 2023-08-27 ENCOUNTER — Inpatient Hospital Stay: Payer: Medicare Other | Attending: Nurse Practitioner

## 2023-08-27 ENCOUNTER — Other Ambulatory Visit: Payer: Self-pay | Admitting: *Deleted

## 2023-08-27 DIAGNOSIS — Z79899 Other long term (current) drug therapy: Secondary | ICD-10-CM | POA: Insufficient documentation

## 2023-08-27 DIAGNOSIS — G20A1 Parkinson's disease without dyskinesia, without mention of fluctuations: Secondary | ICD-10-CM | POA: Diagnosis not present

## 2023-08-27 DIAGNOSIS — C61 Malignant neoplasm of prostate: Secondary | ICD-10-CM

## 2023-08-27 DIAGNOSIS — C7951 Secondary malignant neoplasm of bone: Secondary | ICD-10-CM | POA: Insufficient documentation

## 2023-08-27 DIAGNOSIS — C7800 Secondary malignant neoplasm of unspecified lung: Secondary | ICD-10-CM | POA: Diagnosis not present

## 2023-08-27 LAB — CBC WITH DIFFERENTIAL (CANCER CENTER ONLY)
Abs Immature Granulocytes: 0 10*3/uL (ref 0.00–0.07)
Basophils Absolute: 0 10*3/uL (ref 0.0–0.1)
Basophils Relative: 1 %
Eosinophils Absolute: 0.1 10*3/uL (ref 0.0–0.5)
Eosinophils Relative: 3 %
HCT: 28.7 % — ABNORMAL LOW (ref 39.0–52.0)
Hemoglobin: 9.8 g/dL — ABNORMAL LOW (ref 13.0–17.0)
Immature Granulocytes: 0 %
Lymphocytes Relative: 33 %
Lymphs Abs: 1 10*3/uL (ref 0.7–4.0)
MCH: 37.4 pg — ABNORMAL HIGH (ref 26.0–34.0)
MCHC: 34.1 g/dL (ref 30.0–36.0)
MCV: 109.5 fL — ABNORMAL HIGH (ref 80.0–100.0)
Monocytes Absolute: 0.3 10*3/uL (ref 0.1–1.0)
Monocytes Relative: 10 %
Neutro Abs: 1.6 10*3/uL — ABNORMAL LOW (ref 1.7–7.7)
Neutrophils Relative %: 53 %
Platelet Count: 158 10*3/uL (ref 150–400)
RBC: 2.62 MIL/uL — ABNORMAL LOW (ref 4.22–5.81)
RDW: 16.2 % — ABNORMAL HIGH (ref 11.5–15.5)
WBC Count: 2.9 10*3/uL — ABNORMAL LOW (ref 4.0–10.5)
nRBC: 0 % (ref 0.0–0.2)

## 2023-08-27 LAB — CMP (CANCER CENTER ONLY)
ALT: 6 U/L (ref 0–44)
AST: 17 U/L (ref 15–41)
Albumin: 4.5 g/dL (ref 3.5–5.0)
Alkaline Phosphatase: 47 U/L (ref 38–126)
Anion gap: 5 (ref 5–15)
BUN: 20 mg/dL (ref 8–23)
CO2: 27 mmol/L (ref 22–32)
Calcium: 9.4 mg/dL (ref 8.9–10.3)
Chloride: 106 mmol/L (ref 98–111)
Creatinine: 1.03 mg/dL (ref 0.61–1.24)
GFR, Estimated: >60 mL/min (ref >60)
Glucose, Bld: 107 mg/dL — ABNORMAL HIGH (ref 70–99)
Potassium: 4.3 mmol/L (ref 3.5–5.1)
Sodium: 138 mmol/L (ref 135–145)
Total Bilirubin: 1.3 mg/dL — ABNORMAL HIGH (ref 0.3–1.2)
Total Protein: 6.7 g/dL (ref 6.5–8.1)

## 2023-08-27 MED ORDER — RUCAPARIB CAMSYLATE 300 MG PO TABS
300.0000 mg | ORAL_TABLET | Freq: Two times a day (BID) | ORAL | 1 refills | Status: DC
Start: 2023-08-27 — End: 2023-08-29

## 2023-08-27 NOTE — Progress Notes (Signed)
Corrected daily dosage from 600 mg BID to 300 mg BID per Dr Derek Mound last note.  [facility] needed updated Rx.

## 2023-08-28 LAB — PROSTATE-SPECIFIC AG, SERUM (LABCORP): Prostate Specific Ag, Serum: 0.8 ng/mL (ref 0.0–4.0)

## 2023-08-29 ENCOUNTER — Inpatient Hospital Stay: Payer: Medicare Other

## 2023-08-29 ENCOUNTER — Inpatient Hospital Stay: Payer: Medicare Other | Admitting: Hematology and Oncology

## 2023-08-29 ENCOUNTER — Inpatient Hospital Stay (HOSPITAL_BASED_OUTPATIENT_CLINIC_OR_DEPARTMENT_OTHER): Payer: Medicare Other | Admitting: Hematology and Oncology

## 2023-08-29 VITALS — BP 138/63 | HR 69 | Temp 98.0°F | Resp 14 | Wt 175.0 lb

## 2023-08-29 DIAGNOSIS — C7951 Secondary malignant neoplasm of bone: Secondary | ICD-10-CM | POA: Diagnosis not present

## 2023-08-29 DIAGNOSIS — G893 Neoplasm related pain (acute) (chronic): Secondary | ICD-10-CM | POA: Diagnosis not present

## 2023-08-29 DIAGNOSIS — C61 Malignant neoplasm of prostate: Secondary | ICD-10-CM | POA: Diagnosis not present

## 2023-08-29 MED ORDER — DENOSUMAB 120 MG/1.7ML ~~LOC~~ SOLN
120.0000 mg | Freq: Once | SUBCUTANEOUS | Status: AC
  Administered 2023-08-29: 120 mg via SUBCUTANEOUS
  Filled 2023-08-29: qty 1.7

## 2023-08-29 MED ORDER — RUCAPARIB CAMSYLATE 300 MG PO TABS
300.0000 mg | ORAL_TABLET | Freq: Two times a day (BID) | ORAL | 1 refills | Status: DC
Start: 2023-08-29 — End: 2023-08-30

## 2023-08-29 NOTE — Progress Notes (Signed)
[facility] [phone]   [phone]  PROGRESS NOTE  Patient Care Team: Arnette Felts, FNP as PCP - General (General Practice) Pickenpack-Cousar, Arty Baumgartner, NP as Nurse Practitioner (Nurse Practitioner) Alisa Graff, Pascal Lux, LCSW as Social Worker (Licensed Clinical Social Worker)  Hematological/Oncological History # Metastatic castrate sensitive prostate cancer. 01/15/22: CT-guided biopsy of a pulmonary nodule confirmed the presence of prostate cancer 01/23/2022:  Firmagon 240 mg started  Feb 2023: radiation therapy to the left sacroiliac pelvic bones completed. He received 30 Gray in 10 fractions  03/06/2022: Abiraterone 1000 mg daily with prednisone 5 mg daily started  01/03/2023: last visit with Dr. Clelia Croft. Restarted 500 mg abiraterone daily.  02/06/2023: transfer care to Dr. Leonides Schanz. Increased dose to 1000 mg abiraterone daily.  04/11/2023: Due to increasing PSA transition to rucaparib 600 mg twice daily 07/03/2023: Held Rucaparib due to neutropenia and anemia.  07/31/2023: Resume Rucaparib 300 mg PO BID.   Interval History:  Frederick Ponce 77 y.o. 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 The patient's last visit was on 07/31/2023. In the interim, he has continued Rucaparib.  Today he is accompanied by his husband.  On exam today Frederick Ponce reports he continues to feel unwell.  He reports that he is taking his medications 300 mg twice daily as prescribed.  He notes it has been "okay".  He reports that he still feels bad but there is "nothing obtuse".  He notes that he is in the process of getting his medication refilled at this time.  He does have a little bit of nausea but thinks that may also be associated with the carbidopa/levodopa.  He notes his energy is typically okay but can swing up and down throughout the day.  He is not having any vomiting or diarrhea  and mostly has issues with constipation.  His appetite has been okay.  He reports most of the pain he has is with arthritis as well as neuropathy.  He notes that he has not had any infectious symptoms such as runny nose, sore throat, or cough.  He notes he is somewhat discontent with his quality of life but is willing and able to continue with his medication at this time.[name] He denies fevers, chills, sweats, shortness of breath, chest pain or cough.  A full 10 point ROS was otherwise negative.   MEDICAL HISTORY:  Past Medical History:  Diagnosis Date   Arthritis    Depression    Family history of melanoma    Family history of prostate cancer    GERD (gastroesophageal reflux disease)    Hyperlipidemia    Hypertension    Parkinson disease    Prostate cancer (HCC)     SURGICAL HISTORY: No past surgical history on file.  SOCIAL HISTORY: Social History   Socioeconomic History   Marital status: Married    Spouse name: Janae Sauce   Number of children: 0   Years of education: Not on file   Highest education level: 12th grade  Occupational History   Not on file  Tobacco Use   Smoking status: Never   Smokeless tobacco: Never  Vaping Use   Vaping status: Never Used  Substance and Sexual Activity   Alcohol use: Yes    Alcohol/week: 15.0 standard drinks of alcohol    Types: 15 Glasses of wine per week    Comment: couple glasses a night  Drug use: Never   Sexual activity: Not Currently    Birth control/protection: None  Other Topics Concern   Not on file  Social History Narrative   Not on file   Social Determinants of Health   Financial Resource Strain: Low Risk  (08/09/2023)   Overall Financial Resource Strain (CARDIA)    Difficulty of Paying Living Expenses: Not hard at all  Food Insecurity: No Food Insecurity (08/09/2023)   Hunger Vital Sign    Worried About Running Out of Food in the Last Year: Never true    Ran Out of Food in the Last Year: Never true  Transportation  Needs: No Transportation Needs (08/09/2023)   PRAPARE - Administrator, Civil Service (Medical): No    Lack of Transportation (Non-Medical): No  Physical Activity: Inactive (08/09/2023)   Exercise Vital Sign    Days of Exercise per Week: 0 days    Minutes of Exercise per Session: 0 min  Stress: No Stress Concern Present (08/09/2023)   [employer] of Occupational Health - Occupational Stress Questionnaire    Feeling of Stress : Only a little  Social Connections: Moderately Isolated (08/09/2023)   Social Connection and Isolation Panel [NHANES]    Frequency of Communication with Friends and Family: More than three times a week    Frequency of Social Gatherings with Friends and Family: Once a week    Attends Religious Services: Never    Database administrator or Organizations: No    Attends Engineer, structural: Not on file    Marital Status: Married  Catering manager Violence: Not At Risk (10/12/2022)   Humiliation, Afraid, Rape, and Kick questionnaire    Fear of Current or Ex-Partner: No    Emotionally Abused: No    Physically Abused: No    Sexually Abused: No    FAMILY HISTORY: Family History  Problem Relation Age of Onset   Kidney disease Mother    Colon cancer Mother    Heart disease Father    Tremor Father    Parkinson's disease Father    Prostate cancer Father    Melanoma Sister    Parkinson's disease Sister    Melanoma Sister        melanoma x2   Prostate cancer Brother    Parkinson's disease Paternal Grandmother    Lung cancer Paternal Grandfather    Parkinson's disease Paternal Aunt    Lung cancer Paternal Aunt     ALLERGIES:  is allergic to gabapentin and celebrex [celecoxib].  MEDICATIONS:  Current Outpatient Medications  Medication Sig Dispense Refill   acetaminophen (TYLENOL) 500 MG tablet Take 1,000 mg by mouth every 6 (six) hours as needed.     amLODipine (NORVASC) 5 MG tablet Take 1 tablet (5 mg total) by mouth daily. 90 tablet 1    atorvastatin (LIPITOR) 40 MG tablet TAKE ONE TABLET BY MOUTH EVERY MORNING 90 tablet 1   calcium-vitamin D (OSCAL WITH D) 500-5 MG-MCG tablet Take 1 tablet by mouth 2 (two) times daily. 60 tablet 3   carbidopa-levodopa (SINEMET) 25-100 MG tablet Take 1 tablet by mouth 5 (five) times daily. 450 tablet 1   COENZYME Q10 PO Take 1 capsule by mouth every morning.     DULoxetine (CYMBALTA) 30 MG capsule Take 1 capsule (30 mg total) by mouth daily. 30 capsule 3   fentaNYL (DURAGESIC) 25 MCG/HR Place 1 patch onto the skin every 3 (three) days. 10 patch 0   ibuprofen (ADVIL) 200 MG  tablet Take 200 mg by mouth every 6 (six) hours as needed.     KRILL OIL PO Take 1 capsule by mouth every morning.     meloxicam (MOBIC) 7.5 MG tablet Take 1 tablet (7.5 mg total) by mouth daily. 60 tablet 1   multivitamin-lutein (OCUVITE-LUTEIN) CAPS capsule Take 1 capsule by mouth every morning.     ondansetron (ZOFRAN) 8 MG tablet Take 1 tablet (8 mg total) by mouth every 8 (eight) hours as needed for nausea or vomiting. 20 tablet 1   rucaparib camsylate (RUBRACA) 300 MG tablet Take 1 tablet (300 mg total) by mouth 2 (two) times daily. 120 tablet 1   traMADol (ULTRAM) 50 MG tablet Take 1 tablet (50 mg total) by mouth every 6 (six) hours as needed. 30 tablet 0   vitamin B-12 (CYANOCOBALAMIN) 1000 MCG tablet Take 1,000 mcg by mouth every morning.     No current facility-administered medications for this visit.    REVIEW OF SYSTEMS:   Constitutional: ( - ) fevers, ( - )  chills , ( - ) night sweats Eyes: ( - ) blurriness of vision, ( - ) double vision, ( - ) watery eyes Ears, nose, mouth, throat, and face: ( - ) mucositis, ( - ) sore throat Respiratory: ( - ) cough, ( - ) dyspnea, ( - ) wheezes Cardiovascular: ( - ) palpitation, ( - ) chest discomfort, ( - ) lower extremity swelling Gastrointestinal:  ( - ) nausea, ( - ) heartburn, ( - ) change in bowel habits Skin: ( - ) abnormal skin rashes Lymphatics: ( - ) new  lymphadenopathy, ( - ) easy bruising Neurological: ( - ) numbness, ( - ) tingling, ( - ) new weaknesses Behavioral/Psych: ( - ) mood change, ( - ) new changes  All other systems were reviewed with the patient and are negative.  PHYSICAL EXAMINATION: ECOG PERFORMANCE STATUS: 1 - Symptomatic but completely ambulatory  There were no vitals filed for this visit.      There were no vitals filed for this visit.   GENERAL: Well-appearing elderly Caucasian male, alert, no distress and comfortable SKIN: skin color, texture, turgor are normal, no rashes or significant lesions EYES: conjunctiva are pink and non-injected, sclera clear LUNGS: clear to auscultation and percussion with normal breathing effort HEART: regular rate & rhythm and no murmurs and no lower extremity edema Musculoskeletal: no cyanosis of digits and no clubbing  PSYCH: alert & oriented x 3, fluent speech NEURO: no focal motor/sensory deficits  LABORATORY DATA:  I have reviewed the data as listed    Latest Ref Rng & Units 08/27/2023    3:47 PM 08/16/2023    3:02 PM 07/29/2023    9:51 AM  CBC  WBC 4.0 - 10.5 K/uL 2.9  4.3  3.6   Hemoglobin 13.0 - 17.0 g/dL 9.8  9.9  9.4   Hematocrit 39.0 - 52.0 % 28.7  30.4  27.7   Platelets 150 - 400 K/uL 158  161  202        Latest Ref Rng & Units 08/27/2023    3:47 PM 08/16/2023    3:02 PM 07/29/2023    9:51 AM  CMP  Glucose 70 - 99 mg/dL 098  119  99   BUN 8 - 23 mg/dL 20  20  21    Creatinine 0.61 - 1.24 mg/dL 1.47  8.29  5.62   Sodium 135 - 145 mmol/L 138  139  139   Potassium  3.5 - 5.1 mmol/L 4.3  4.3  5.1   Chloride 98 - 111 mmol/L 106  106  106   CO2 22 - 32 mmol/L 27  27  28    Calcium 8.9 - 10.3 mg/dL 9.4  9.4  9.1   Total Protein 6.5 - 8.1 g/dL 6.7  6.8  6.7   Total Bilirubin 0.3 - 1.2 mg/dL 1.3  1.2  1.0   Alkaline Phos 38 - 126 U/L 47  51  54   AST 15 - 41 U/L 17  17  14    ALT 0 - 44 U/L 6  7  6      Lab Results  Component Value Date   MPROTEIN Not Observed  12/27/2021   Lab Results  Component Value Date   KPAFRELGTCHN 16.0 12/27/2021   LAMBDASER 12.2 12/27/2021   KAPLAMBRATIO 1.31 12/27/2021     RADIOGRAPHIC STUDIES: No results found.  ASSESSMENT & PLAN Ikaia Sciandra is a 77 y.o. 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  # Metastatic castrate sensitive prostate cancer. 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 WBC 2.9, hemoglobin 9.8, MCV 109.5, and platelets of 158. Creatinine and LFTs in range. PSA normal at 1.1. --Reviewed CT CAP from 07/18/2023 that showed treatment response.  --continue Rucaparib at 300 mg p.o. twice daily. --Last dose of Eligard on 08/19/2023. Next dose in 4 months (Dec 2024).  --Proceed with Xgeva injection monthly. Next one due today.  --RTC in 2 weeks for lab check and 4 weeks for labs/follow up  No orders of the defined types were placed in this encounter.   All questions were answered. The patient knows to call the clinic with any problems, questions or concerns.  I have spent a total of 30 minutes minutes of face-to-face and non-face-to-face time, preparing to see the patient, performing a medically appropriate examination, counseling and educating the patient, documenting clinical information in the electronic health record, independently interpreting results and communicating results to the patient, and care coordination.   Ulysees Barns, MD Department of Hematology/Oncology [facility] Phone: [phone] Pager: [phone] Email: [email or web address]   08/29/2023 7:21 AM

## 2023-08-30 ENCOUNTER — Other Ambulatory Visit: Payer: Self-pay | Admitting: *Deleted

## 2023-08-30 ENCOUNTER — Telehealth: Payer: Self-pay | Admitting: Hematology and Oncology

## 2023-08-30 DIAGNOSIS — C7951 Secondary malignant neoplasm of bone: Secondary | ICD-10-CM

## 2023-08-30 MED ORDER — RUCAPARIB CAMSYLATE 300 MG PO TABS: 300.0000 mg | ORAL_TABLET | Freq: Two times a day (BID) | ORAL | 1 refills | Status: DC

## 2023-08-31 ENCOUNTER — Encounter: Payer: Self-pay | Admitting: Hematology and Oncology

## 2023-09-04 NOTE — Progress Notes (Unsigned)
Patient: Frederick Ponce Date of Birth: July 03, 1946  Reason for Visit: Follow up History from: Patient, husband Primary Neurologist: Frances Furbish   ASSESSMENT AND PLAN 77 y.o. year old male   1.  Parkinson's disease 2.  Metastatic prostate cancer -Has remained overall stable -Continue Sinemet 25/100 mg 1 tablet 4 times daily -Encouraged to remain active, recommend exercise -Will continue close follow-up with oncology -Follow-up with me in 6 months or sooner if needed  HISTORY OF PRESENT ILLNESS: Today 09/04/23   02/11/23 SS: Here with husband, taking Sinemet 25/100 mg 1 tablet 4 times daily, 8AM, 1 PM, 8PM, 2 AM. He has few glasses of wine with dinner, doesn't want to take Sinemet at that time. Takes tablets every 5 hours. Notices when dose wearing off, more tremor, feel weak. Takes morphine for chronic pain. Is on Zytiga for prostate cancer. Recent PSA has increased. Side effect of acne. Today last took Sinemet 5 AM. No major changes with PD.  HISTORY  Today, 10/09/2022: He reports doing fairly well with increased levodopa, he increase it about 3 weeks ago to 1 pill 4 times a day.  He feels like he tolerates it, does not particularly cause of any indigestion or nausea but he has had GI trouble in the recent past.  He reports that he had COVID, he went to the emergency room for this.  I reviewed the emergency room records from 08/29/2022.  He presented with fever.  He was treated with molnupiravir.  He had more noticeable tremor after he had COVID and increasing the levodopa was supposed to be just a trial by him but as he had sustained effect from it, he continued with 1 pill 4 times a day.  He generally takes it 6 hourly starting around 6 AM.  He has an appointment pending with urology as well as GI.  Some 6 weeks ago he stopped all his medications except for his heart medication and his levodopa.  He has had 3 rounds of radiation therapy, he is in follow-up with his oncologist on a regular  basis.  He does take MS Contin.  He has had intermittent constipation.   REVIEW OF SYSTEMS: Out of a complete 14 system review of symptoms, the patient complains only of the following symptoms, and all other reviewed systems are negative.  See HPI  ALLERGIES: Allergies  Allergen Reactions   Gabapentin Other (See Comments)    Restless legs/confusion   Celebrex [Celecoxib] Rash    HOME MEDICATIONS: Outpatient Medications Prior to Visit  Medication Sig Dispense Refill   acetaminophen (TYLENOL) 500 MG tablet Take 1,000 mg by mouth every 6 (six) hours as needed.     amLODipine (NORVASC) 5 MG tablet Take 1 tablet (5 mg total) by mouth daily. 90 tablet 1   atorvastatin (LIPITOR) 40 MG tablet TAKE ONE TABLET BY MOUTH EVERY MORNING 90 tablet 1   calcium-vitamin D (OSCAL WITH D) 500-5 MG-MCG tablet Take 1 tablet by mouth 2 (two) times daily. 60 tablet 3   carbidopa-levodopa (SINEMET) 25-100 MG tablet Take 1 tablet by mouth 5 (five) times daily. 450 tablet 1   COENZYME Q10 PO Take 1 capsule by mouth every morning.     DULoxetine (CYMBALTA) 30 MG capsule Take 1 capsule (30 mg total) by mouth daily. 30 capsule 3   fentaNYL (DURAGESIC) 25 MCG/HR Place 1 patch onto the skin every 3 (three) days. 10 patch 0   ibuprofen (ADVIL) 200 MG tablet Take 200 mg by mouth every  6 (six) hours as needed.     KRILL OIL PO Take 1 capsule by mouth every morning.     meloxicam (MOBIC) 7.5 MG tablet Take 1 tablet (7.5 mg total) by mouth daily. 60 tablet 1   multivitamin-lutein (OCUVITE-LUTEIN) CAPS capsule Take 1 capsule by mouth every morning.     ondansetron (ZOFRAN) 8 MG tablet Take 1 tablet (8 mg total) by mouth every 8 (eight) hours as needed for nausea or vomiting. 20 tablet 1   rucaparib camsylate (RUBRACA) 300 MG tablet Take 1 tablet (300 mg total) by mouth 2 (two) times daily. 60 tablet 1   traMADol (ULTRAM) 50 MG tablet Take 1 tablet (50 mg total) by mouth every 6 (six) hours as needed. 30 tablet 0   vitamin  B-12 (CYANOCOBALAMIN) 1000 MCG tablet Take 1,000 mcg by mouth every morning.     No facility-administered medications prior to visit.    PAST MEDICAL HISTORY: Past Medical History:  Diagnosis Date   Arthritis    Depression    Family history of melanoma    Family history of prostate cancer    GERD (gastroesophageal reflux disease)    Hyperlipidemia    Hypertension    Parkinson disease    Prostate cancer (HCC)     PAST SURGICAL HISTORY: No past surgical history on file.  FAMILY HISTORY: Family History  Problem Relation Age of Onset   Kidney disease Mother    Colon cancer Mother    Heart disease Father    Tremor Father    Parkinson's disease Father    Prostate cancer Father    Melanoma Sister    Parkinson's disease Sister    Melanoma Sister        melanoma x2   Prostate cancer Brother    Parkinson's disease Paternal Grandmother    Lung cancer Paternal Grandfather    Parkinson's disease Paternal Aunt    Lung cancer Paternal Aunt     SOCIAL HISTORY: Social History   Socioeconomic History   Marital status: Married    Spouse name: Janae Sauce   Number of children: 0   Years of education: Not on file   Highest education level: 12th grade  Occupational History   Not on file  Tobacco Use   Smoking status: Never   Smokeless tobacco: Never  Vaping Use   Vaping status: Never Used  Substance and Sexual Activity   Alcohol use: Yes    Alcohol/week: 15.0 standard drinks of alcohol    Types: 15 Glasses of wine per week    Comment: couple glasses a night   Drug use: Never   Sexual activity: Not Currently    Birth control/protection: None  Other Topics Concern   Not on file  Social History Narrative   Not on file   Social Determinants of Health   Financial Resource Strain: Low Risk  (08/09/2023)   Overall Financial Resource Strain (CARDIA)    Difficulty of Paying Living Expenses: Not hard at all  Food Insecurity: No Food Insecurity (08/09/2023)   Hunger Vital  Sign    Worried About Running Out of Food in the Last Year: Never true    Ran Out of Food in the Last Year: Never true  Transportation Needs: No Transportation Needs (08/09/2023)   PRAPARE - Administrator, Civil Service (Medical): No    Lack of Transportation (Non-Medical): No  Physical Activity: Inactive (08/09/2023)   Exercise Vital Sign    Days of Exercise per Week:  0 days    Minutes of Exercise per Session: 0 min  Stress: No Stress Concern Present (08/09/2023)   [employer] of Occupational Health - Occupational Stress Questionnaire    Feeling of Stress : Only a little  Social Connections: Moderately Isolated (08/09/2023)   Social Connection and Isolation Panel [NHANES]    Frequency of Communication with Friends and Family: More than three times a week    Frequency of Social Gatherings with Friends and Family: Once a week    Attends Religious Services: Never    Database administrator or Organizations: No    Attends Engineer, structural: Not on file    Marital Status: Married  Catering manager Violence: Not At Risk (10/12/2022)   Humiliation, Afraid, Rape, and Kick questionnaire    Fear of Current or Ex-Partner: No    Emotionally Abused: No    Physically Abused: No    Sexually Abused: No    PHYSICAL EXAM  There were no vitals filed for this visit.  There is no height or weight on file to calculate BMI.  Generalized: Well developed, in no acute distress, mild masking of the face Neurological examination  Mentation: Alert oriented to time, place, history taking. Follows all commands speech and language fluent Cranial nerve II-XII: Pupils were equal round reactive to light. Extraocular movements were full, visual field were full on confrontational test. Facial sensation and strength were normal.  Head turning and shoulder shrug  were normal and symmetric. Motor: The motor testing reveals 5 over 5 strength of all 4 extremities. Good symmetric motor tone is  noted throughout.  Mild resting tremor to both hands noted.  No significant rigidity or bradykinesia. Sensory: Sensory testing is intact to soft touch on all 4 extremities. No evidence of extinction is noted.  Coordination: Cerebellar testing reveals good finger-nose-finger and heel-to-shin bilaterally.  Mild tremor with finger-nose-finger bilaterally. Gait and station: Can stand from seated position independently, armswing, turns, stride appear normal Reflexes: Deep tendon reflexes are symmetric and normal bilaterally.   DIAGNOSTIC DATA (LABS, IMAGING, TESTING) - I reviewed patient records, labs, notes, testing and imaging myself where available.  Lab Results  Component Value Date   WBC 2.9 (L) 08/27/2023   HGB 9.8 (L) 08/27/2023   HCT 28.7 (L) 08/27/2023   MCV 109.5 (H) 08/27/2023   PLT 158 08/27/2023      Component Value Date/Time   NA 138 08/27/2023 1547   NA 140 09/14/2021 0920   K 4.3 08/27/2023 1547   CL 106 08/27/2023 1547   CO2 27 08/27/2023 1547   GLUCOSE 107 (H) 08/27/2023 1547   BUN 20 08/27/2023 1547   BUN 18 09/14/2021 0920   CREATININE 1.03 08/27/2023 1547   CALCIUM 9.4 08/27/2023 1547   PROT 6.7 08/27/2023 1547   PROT 6.3 09/14/2021 0920   ALBUMIN 4.5 08/27/2023 1547   ALBUMIN 4.2 09/14/2021 0920   AST 17 08/27/2023 1547   ALT 6 08/27/2023 1547   ALKPHOS 47 08/27/2023 1547   BILITOT 1.3 (H) 08/27/2023 1547   GFRNONAA >60 08/27/2023 1547   Lab Results  Component Value Date   CHOL 175 02/20/2023   HDL 70 02/20/2023   LDLCALC 91 02/20/2023   TRIG 75 02/20/2023   CHOLHDL 2.5 02/20/2023   Lab Results  Component Value Date   HGBA1C 5.9 (H) 02/20/2023   Lab Results  Component Value Date   VITAMINB12 1,651 (H) 02/20/2023   Lab Results  Component Value Date   TSH  1.520 02/20/2023    Margie Ege, AGNP-C, DNP 09/04/2023, 11:38 AM [facility] [address] [place], [place] [address] [phone]

## 2023-09-05 ENCOUNTER — Ambulatory Visit (INDEPENDENT_AMBULATORY_CARE_PROVIDER_SITE_OTHER): Payer: Medicare Other | Admitting: Neurology

## 2023-09-05 ENCOUNTER — Other Ambulatory Visit: Payer: Self-pay | Admitting: Nurse Practitioner

## 2023-09-05 ENCOUNTER — Telehealth: Payer: Self-pay

## 2023-09-05 ENCOUNTER — Other Ambulatory Visit: Payer: Self-pay

## 2023-09-05 ENCOUNTER — Encounter: Payer: Self-pay | Admitting: Neurology

## 2023-09-05 VITALS — BP 134/76 | HR 75 | Ht 68.0 in | Wt 175.0 lb

## 2023-09-05 DIAGNOSIS — G893 Neoplasm related pain (acute) (chronic): Secondary | ICD-10-CM

## 2023-09-05 DIAGNOSIS — C7951 Secondary malignant neoplasm of bone: Secondary | ICD-10-CM

## 2023-09-05 DIAGNOSIS — C61 Malignant neoplasm of prostate: Secondary | ICD-10-CM

## 2023-09-05 DIAGNOSIS — Z515 Encounter for palliative care: Secondary | ICD-10-CM

## 2023-09-05 DIAGNOSIS — G20A1 Parkinson's disease without dyskinesia, without mention of fluctuations: Secondary | ICD-10-CM

## 2023-09-05 MED ORDER — CARBIDOPA-LEVODOPA 25-100 MG PO TABS: 1.0000 | ORAL_TABLET | Freq: Four times a day (QID) | ORAL | 3 refills | Status: DC

## 2023-09-05 MED ORDER — FENTANYL 25 MCG/HR TD PT72: 1.0000 | MEDICATED_PATCH | TRANSDERMAL | 0 refills | Status: DC

## 2023-09-05 NOTE — Telephone Encounter (Signed)
Pt called requesting a refill on Fentanyl patches.  Pt stated he has 1 patch left and would like a refill.  Reviewed pt's chart and prescription last refilled on 08/08/2023.  Informed pt that this nurse will notify [facility] "Willette Alma, NP of his request.  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 Pt stated the Neurologist wants the pt to restart Gabapentin but would like for [facility] to coordinate with them regarding pt's Gabapentin.  Stated this nurse will make [facility] aware.

## 2023-09-05 NOTE — Patient Instructions (Addendum)
Try adjusting your Sinemet to 8, 12, 4, 8 to see if better coverage, monitoring for any worsening symptoms.   Meds ordered this encounter  Medications   carbidopa-levodopa (SINEMET) 25-100 MG tablet    Sig: Take 1 tablet by mouth 4 (four) times daily.    Dispense:  360 tablet    Refill:  3    Refills on file

## 2023-09-09 ENCOUNTER — Encounter: Payer: Self-pay | Admitting: Neurology

## 2023-09-10 ENCOUNTER — Other Ambulatory Visit: Payer: Self-pay | Admitting: Nurse Practitioner

## 2023-09-10 DIAGNOSIS — C7951 Secondary malignant neoplasm of bone: Secondary | ICD-10-CM

## 2023-09-10 DIAGNOSIS — Z515 Encounter for palliative care: Secondary | ICD-10-CM

## 2023-09-10 DIAGNOSIS — G893 Neoplasm related pain (acute) (chronic): Secondary | ICD-10-CM

## 2023-09-12 ENCOUNTER — Inpatient Hospital Stay: Payer: Medicare Other

## 2023-09-12 DIAGNOSIS — C7951 Secondary malignant neoplasm of bone: Secondary | ICD-10-CM

## 2023-09-12 DIAGNOSIS — C61 Malignant neoplasm of prostate: Secondary | ICD-10-CM | POA: Diagnosis not present

## 2023-09-12 LAB — CBC WITH DIFFERENTIAL (CANCER CENTER ONLY)
Abs Immature Granulocytes: 0.01 10*3/uL (ref 0.00–0.07)
Basophils Absolute: 0 10*3/uL (ref 0.0–0.1)
Basophils Relative: 1 %
Eosinophils Absolute: 0.1 10*3/uL (ref 0.0–0.5)
Eosinophils Relative: 4 %
HCT: 30.6 % — ABNORMAL LOW (ref 39.0–52.0)
Hemoglobin: 10 g/dL — ABNORMAL LOW (ref 13.0–17.0)
Immature Granulocytes: 0 %
Lymphocytes Relative: 29 %
Lymphs Abs: 1 10*3/uL (ref 0.7–4.0)
MCH: 36 pg — ABNORMAL HIGH (ref 26.0–34.0)
MCHC: 32.7 g/dL (ref 30.0–36.0)
MCV: 110.1 fL — ABNORMAL HIGH (ref 80.0–100.0)
Monocytes Absolute: 0.4 10*3/uL (ref 0.1–1.0)
Monocytes Relative: 11 %
Neutro Abs: 1.9 10*3/uL (ref 1.7–7.7)
Neutrophils Relative %: 55 %
Platelet Count: 160 10*3/uL (ref 150–400)
RBC: 2.78 MIL/uL — ABNORMAL LOW (ref 4.22–5.81)
RDW: 14.5 % (ref 11.5–15.5)
WBC Count: 3.3 10*3/uL — ABNORMAL LOW (ref 4.0–10.5)
nRBC: 0 % (ref 0.0–0.2)

## 2023-09-12 LAB — CMP (CANCER CENTER ONLY)
ALT: 11 U/L (ref 0–44)
AST: 18 U/L (ref 15–41)
Albumin: 4.3 g/dL (ref 3.5–5.0)
Alkaline Phosphatase: 49 U/L (ref 38–126)
Anion gap: 5 (ref 5–15)
BUN: 24 mg/dL — ABNORMAL HIGH (ref 8–23)
CO2: 27 mmol/L (ref 22–32)
Calcium: 9.6 mg/dL (ref 8.9–10.3)
Chloride: 108 mmol/L (ref 98–111)
Creatinine: 1.06 mg/dL (ref 0.61–1.24)
GFR, Estimated: >60 mL/min (ref >60)
Glucose, Bld: 130 mg/dL — ABNORMAL HIGH (ref 70–99)
Potassium: 4.3 mmol/L (ref 3.5–5.1)
Sodium: 140 mmol/L (ref 135–145)
Total Bilirubin: 1 mg/dL (ref 0.3–1.2)
Total Protein: 6.6 g/dL (ref 6.5–8.1)

## 2023-09-13 LAB — PROSTATE-SPECIFIC AG, SERUM (LABCORP): Prostate Specific Ag, Serum: 0.8 ng/mL (ref 0.0–4.0)

## 2023-09-16 ENCOUNTER — Encounter: Payer: Self-pay | Admitting: Nurse Practitioner

## 2023-09-16 ENCOUNTER — Inpatient Hospital Stay (HOSPITAL_BASED_OUTPATIENT_CLINIC_OR_DEPARTMENT_OTHER): Payer: Medicare Other | Admitting: Nurse Practitioner

## 2023-09-16 VITALS — BP 128/60 | HR 87 | Temp 98.1°F | Resp 18 | Ht 68.0 in | Wt 177.2 lb

## 2023-09-16 DIAGNOSIS — C7951 Secondary malignant neoplasm of bone: Secondary | ICD-10-CM

## 2023-09-16 DIAGNOSIS — M792 Neuralgia and neuritis, unspecified: Secondary | ICD-10-CM | POA: Diagnosis not present

## 2023-09-16 DIAGNOSIS — G893 Neoplasm related pain (acute) (chronic): Secondary | ICD-10-CM | POA: Diagnosis not present

## 2023-09-16 DIAGNOSIS — Z515 Encounter for palliative care: Secondary | ICD-10-CM

## 2023-09-16 DIAGNOSIS — R530 Neoplastic (malignant) related fatigue: Secondary | ICD-10-CM

## 2023-09-16 DIAGNOSIS — C61 Malignant neoplasm of prostate: Secondary | ICD-10-CM

## 2023-09-16 MED ORDER — GABAPENTIN 300 MG PO CAPS
300.0000 mg | ORAL_CAPSULE | Freq: Every day | ORAL | 0 refills | Status: DC
Start: 2023-09-16 — End: 2023-10-17

## 2023-09-16 NOTE — Progress Notes (Signed)
Palliative Medicine [facility]  [phone] Fax:(336) [phone]   Name: Frederick Ponce Date: 09/16/2023 MRN: [id number]  DOB: October 28, 1946  Patient Care Team: Frederick Felts, FNP as PCP - General (General Practice) Pickenpack-Cousar, Arty Baumgartner, NP as Nurse Practitioner (Nurse Practitioner) Duffy, Pascal Lux, LCSW as Social Worker (Licensed Clinical Social Worker)    REASON FOR CONSULTATION: Frederick Ponce is a 77 y.o. male with medical history including Parkinson's disease and hyperlipidemia. 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 He is having urgent radiation appointment due to iliac bone pain. Palliative ask to see for symptom management.    SOCIAL HISTORY:     reports that he has never smoked. He has never used smokeless tobacco. He reports current alcohol use of about 15.0 standard drinks of alcohol per week. He reports that he does not use drugs.  ADVANCE DIRECTIVES:  Patient is scheduled to complete advanced directives at our clinic here at the cancer center on 02/16/2022. MOST form provided.  Patient plans to review and complete at a later time.   CODE STATUS:   PAST MEDICAL HISTORY: Past Medical History:  Diagnosis Date   Arthritis    Depression    Family history of melanoma    Family history of prostate cancer    GERD (gastroesophageal reflux disease)    Hyperlipidemia    Hypertension    Parkinson disease    Prostate cancer (HCC)     PAST SURGICAL HISTORY: No past surgical history on file.  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  The report date is December 21, 2022.  The CancerNext-Expanded gene panel offered by [facility] and includes sequencing and rearrangement analysis for the following 77 genes: AIP, ALK, APC*, ATM*, AXIN2, BAP1, BARD1, BLM, BMPR1A, BRCA1*, BRCA2*, BRIP1*, CDC73, CDH1*, CDK4, CDKN1B, CDKN2A, CHEK2*, CTNNA1, DICER1, FANCC, FH, FLCN, GALNT12, KIF1B, LZTR1, MAX, MEN1, MET, MLH1*, MSH2*, MSH3, MSH6*, MUTYH*, NBN, NF1*, NF2, NTHL1, PALB2*, PHOX2B, PMS2*, POT1, PRKAR1A, PTCH1, PTEN*, RAD51C*, RAD51D*, RB1, RECQL, RET, SDHA, SDHAF2, SDHB, SDHC, SDHD, SMAD4, SMARCA4, SMARCB1, SMARCE1, STK11, SUFU, TMEM127, TP53*, TSC1, TSC2, VHL and XRCC2 (sequencing and deletion/duplication); EGFR, EGLN1, HOXB13, KIT, MITF, PDGFRA, POLD1, and POLE (sequencing only); EPCAM and GREM1 (deletion/duplication only). DNA and RNA analyses performed for * genes.      ALLERGIES:  is allergic to gabapentin and celebrex [celecoxib].  MEDICATIONS:  Current Outpatient Medications  Medication Sig Dispense Refill   acetaminophen (TYLENOL) 500 MG tablet Take 1,000 mg by mouth every 6 (six) hours as needed.     amLODipine (NORVASC) 5 MG tablet Take 1 tablet (5 mg total) by mouth daily. 90 tablet 1   atorvastatin (LIPITOR) 40 MG tablet TAKE ONE TABLET BY MOUTH EVERY MORNING 90 tablet 1   calcium-vitamin D (OSCAL WITH D) 500-5 MG-MCG tablet Take 1 tablet by mouth 2 (two) times daily. 60 tablet 3   carbidopa-levodopa (SINEMET) 25-100 MG tablet Take 1  tablet by mouth 4 (four) times daily. 360 tablet 3   COENZYME Q10 PO Take 1 capsule by mouth every morning.     fentaNYL (DURAGESIC) 25 MCG/HR Place 1 patch onto the skin every 3 (three) days. 10 patch 0   ibuprofen (ADVIL) 200 MG tablet Take 200 mg by mouth every 6 (six) hours as needed.      KRILL OIL PO Take 1 capsule by mouth every morning.     meloxicam (MOBIC) 7.5 MG tablet Take 1 tablet (7.5 mg total) by mouth daily. 60 tablet 1   multivitamin-lutein (OCUVITE-LUTEIN) CAPS capsule Take 1 capsule by mouth every morning.     ondansetron (ZOFRAN) 8 MG tablet Take 1 tablet (8 mg total) by mouth every 8 (eight) hours as needed for nausea or vomiting. 20 tablet 1   rucaparib camsylate (RUBRACA) 300 MG tablet Take 1 tablet (300 mg total) by mouth 2 (two) times daily. 60 tablet 1   traMADol (ULTRAM) 50 MG tablet Take 1 tablet (50 mg total) by mouth every 6 (six) hours as needed. 30 tablet 0   vitamin B-12 (CYANOCOBALAMIN) 1000 MCG tablet Take 1,000 mcg by mouth every morning.     No current facility-administered medications for this visit.    PERFORMANCE STATUS (ECOG) : 1 - Symptomatic but completely ambulatory  Assessment NAD, ambulatory RRR Normal breathing pattern Bilateral lower extremity edema AAO x4  IMPRESSION: Frederick Ponce presents to clinic today for follow-up. No acute distress. His husband is present. Overall doing well. Denies nausea, vomiting, constipation, or diarrhea. He scheduled this appointment to follow-up on his ongoing pain and discomfort post visit with his Neurologist. Would like to consider starting Gabapentin.   Neoplasm related pain  Frederick Ponce states his pain continues to be a challenge. Describes pain as radiating down his legs. Pain is worst at night. Sharp and shooting. Will ease with use of mobic however this lowers his blood pressure and makes him feel droggy then next day. Minimal relief with Tramadol use. His discomfort does not allow him much sleep at night. States he will sleep several hours and will awaken due to leg discomfort. Turn on his side and pain eases up only to move to the other leg. Discomfort occurs nightly. When waking in the morning or during the night it is difficult for him to get up and walk due to his leg/feet discomfort. Feels  some relief with use of Fentanyl patch however pain remains uncontrolled and quality of life is significantly decreased due to discomfort. He and Frederick Ponce share request for consideration of titration of patch versus consideration of other options. He was recently seen by Neurology PA and they discussed his pain and discomfort. [name] is now open to considering use of gabapentin despite previous intolerance. Reports Neurology team felt this would be an appropriate option however would leave final decisions to our team.   I discussed at length with Mr. Reem and Frederick Ponce his overall pain and discomfort. His pain that he is describing is more neuropathic in content. We have discussed use of gabapentin in the past however he has been reluctant to use due to previous intolerance causing delirium and confusion. He feels that he would like to attempt use again. He recently tried a 300mg  capsule x1 night without difficulty.   Education provided on use of gabapentin. Initiation of this medication would be appropriate given his neuropathic pain and symptoms. Education provided on use of medication, titration goals if tolerated, and potential side effects including  when to seek assistance and when to discontinue use. He verbalized understanding and appreciation.   Mr. Vasas will continue with Fentanyl patch . We discussed his current dosing. He has concerns regarding being on a low dose. I discussed his current dosing compared to previous morphine usage in addition to use of fentanyl for the type of pain that he is describing. He is aware we will not make adjustments at this time. The goal is for improvement with use of gabapentin. Will initiate use of gabapentin 300mg  at bedtime. Will titrate up to 300mg  every morning and at bedtime if no intolerance.   We will continue to closely monitor and support. Patient knows to contact office as needed.   PLAN:  Fentanyl 25 mcg patch.  Will monitor closely and titrate as  needed. No changes at this time.  Gabapentin 300 mg at bedtime.  Tylenol extra strength as needed Meloxicam 7.5 mg twice daily Tramadol 50-100mg  twice daily  Prune juice, tea, Dulcolax daily for bowel regimen  I will see patient back in 2 weeks in collaboration to other oncology appointments. Phone follow-up on Friday.   Patient expressed understanding and was in agreement with this plan. He also understands that He can call the clinic at any time with any questions, concerns, or complaints.    Any controlled substances utilized were prescribed in the context of palliative care. PDMP has been reviewed.    Visit consisted of counseling and education dealing with the complex and emotionally intense issues of symptom management and palliative care in the setting of serious and potentially life-threatening illness.Greater than 50%  of this time was spent counseling and coordinating care related to the above assessment and plan.  Willette Alma, AGPCNP-BC  Palliative Medicine Team/[facility]  *Please note that this is a verbal dictation therefore any spelling or grammatical errors are due to the "Dragon Medical One" system interpretation.

## 2023-09-19 NOTE — Progress Notes (Signed)
Palliative Medicine [facility]  Telephone:(336) [phone] Fax:(336) [phone]   Name: Frederick Ponce Date: 09/19/2023 MRN: [id number]  DOB: 1946-08-14  Patient Care Team: Arnette Felts, FNP as PCP - General (General Practice) Pickenpack-Cousar, Arty Baumgartner, NP as Nurse Practitioner (Nurse Practitioner) Alisa Graff, Pascal Lux, LCSW as Social Worker (Licensed Clinical Social Worker)   I connected with Hulda Marin on 09/19/23 at 10:30 AM EDT by phone and verified that I am speaking with the correct person using two identifiers.   I discussed the limitations, risks, security and privacy concerns of performing an evaluation and management service by telemedicine and the availability of in-person appointments. I also discussed with the patient that there may be a patient responsible charge related to this service. The patient expressed understanding and agreed to proceed.   Other persons participating in the visit and their role in the encounter: n/a   Patient's location: home  Provider's location: [facility]   Chief Complaint: f/u of symptom management    REASON FOR CONSULTATION: Frederick Ponce is a 77 y.o. male with medical history including Parkinson's disease and hyperlipidemia. 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 He is having urgent radiation appointment due to iliac bone pain. Palliative ask to see for symptom management.    SOCIAL HISTORY:     reports that he has never smoked. He has never used smokeless tobacco. He reports current alcohol use of about 15.0 standard drinks of alcohol per week. He reports that he does not use drugs.  ADVANCE DIRECTIVES:  Patient is scheduled to complete advanced directives at our clinic here at the cancer center on 02/16/2022. MOST form provided.  Patient plans to review and complete at a later time.   CODE STATUS:    PAST MEDICAL HISTORY: Past Medical History:  Diagnosis Date   Arthritis    Depression    Family history of melanoma    Family history of prostate cancer    GERD (gastroesophageal reflux disease)    Hyperlipidemia    Hypertension    Parkinson disease    Prostate cancer (HCC)     PAST SURGICAL HISTORY: No past surgical history on file.  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  The report date is December 21, 2022.  The CancerNext-Expanded gene panel offered by [facility] and includes sequencing and rearrangement analysis for the following 77 genes: AIP, ALK, APC*, ATM*, AXIN2, BAP1, BARD1, BLM, BMPR1A, BRCA1*, BRCA2*, BRIP1*, CDC73, CDH1*, CDK4, CDKN1B, CDKN2A, CHEK2*, CTNNA1, DICER1, FANCC, FH, FLCN, GALNT12, KIF1B, LZTR1, MAX, MEN1, MET, MLH1*, MSH2*, MSH3, MSH6*, MUTYH*, NBN, NF1*, NF2, NTHL1, PALB2*, PHOX2B, PMS2*, POT1, PRKAR1A, PTCH1, PTEN*, RAD51C*, RAD51D*, RB1, RECQL, RET, SDHA, SDHAF2, SDHB, SDHC, SDHD, SMAD4, SMARCA4, SMARCB1, SMARCE1, STK11, SUFU, TMEM127, TP53*, TSC1, TSC2, VHL and XRCC2 (sequencing and deletion/duplication); EGFR, EGLN1, HOXB13, KIT, MITF, PDGFRA, POLD1, and POLE (sequencing only); EPCAM and GREM1 (deletion/duplication only). DNA and RNA analyses performed for *  genes.      ALLERGIES:  is allergic to gabapentin and celebrex [celecoxib].  MEDICATIONS:  Current Outpatient Medications  Medication Sig Dispense Refill   acetaminophen (TYLENOL) 500 MG tablet Take 1,000 mg  by mouth every 6 (six) hours as needed.     amLODipine (NORVASC) 5 MG tablet Take 1 tablet (5 mg total) by mouth daily. 90 tablet 1   atorvastatin (LIPITOR) 40 MG tablet TAKE ONE TABLET BY MOUTH EVERY MORNING 90 tablet 1   calcium-vitamin D (OSCAL WITH D) 500-5 MG-MCG tablet Take 1 tablet by mouth 2 (two) times daily. 60 tablet 3   carbidopa-levodopa (SINEMET) 25-100 MG tablet Take 1 tablet by mouth 4 (four) times daily. 360 tablet 3   COENZYME Q10 PO Take 1 capsule by mouth every morning.     fentaNYL (DURAGESIC) 25 MCG/HR Place 1 patch onto the skin every 3 (three) days. 10 patch 0   gabapentin (NEURONTIN) 300 MG capsule Take 1 capsule (300 mg total) by mouth at bedtime. 30 capsule 0   ibuprofen (ADVIL) 200 MG tablet Take 200 mg by mouth every 6 (six) hours as needed.     KRILL OIL PO Take 1 capsule by mouth every morning.     meloxicam (MOBIC) 7.5 MG tablet Take 1 tablet (7.5 mg total) by mouth daily. 60 tablet 1   multivitamin-lutein (OCUVITE-LUTEIN) CAPS capsule Take 1 capsule by mouth every morning.     ondansetron (ZOFRAN) 8 MG tablet Take 1 tablet (8 mg total) by mouth every 8 (eight) hours as needed for nausea or vomiting. 20 tablet 1   rucaparib camsylate (RUBRACA) 300 MG tablet Take 1 tablet (300 mg total) by mouth 2 (two) times daily. 60 tablet 1   traMADol (ULTRAM) 50 MG tablet Take 1 tablet (50 mg total) by mouth every 6 (six) hours as needed. 30 tablet 0   vitamin B-12 (CYANOCOBALAMIN) 1000 MCG tablet Take 1,000 mcg by mouth every morning.     No current facility-administered medications for this visit.    PERFORMANCE STATUS (ECOG) : 1 - Symptomatic but completely ambulatory   IMPRESSION: I connected with [name] by phone for follow-up. No acute distress noted.   Neoplasm related pain  Mr. Bhuiyan states the late evening/night time hours are his biggest challenges with his pain discomfort and first thing in the morning.   Tolerating Fentanyl patch. We initiated Gabapentin  300mg  at bedtime. Tolerating without difficulty. No unwanted side effects. He states he has not been able to see a huge amount of different in his pain and discomfort at night however around 1pm there is some improvement and over the past several days he has been able to get more rest than he has in weeks. Several hours uninterrupted due to pain. Unfortunately when he awakens the pain is still there.   We discussed increasing dose of gabapentin to 600mg  at bedtime. He is aware if he experiences increased drowsiness or side effects to immediately discontinue and decrease dose back down to 300mg .   We will continue to closely monitor closely and support. Patient knows to contact office as needed.   PLAN:  Fentanyl 25 mcg patch.  Will monitor closely and titrate as needed. No changes at this time.  Gabapentin 600 mg at bedtime.  Tylenol extra strength as needed Meloxicam 7.5 mg twice daily Tramadol 50-100mg  twice daily  Prune juice, tea, Dulcolax daily for bowel regimen  I will see patient back in 1 weeks in collaboration to other oncology  appointments.   Patient expressed understanding and was in agreement with this plan. He also understands that He can call the clinic at any time with any questions, concerns, or complaints.    Any controlled substances utilized were prescribed in the context of palliative care. PDMP has been reviewed.    Visit consisted of counseling and education dealing with the complex and emotionally intense issues of symptom management and palliative care in the setting of serious and potentially life-threatening illness.Greater than 50%  of this time was spent counseling and coordinating care related to the above assessment and plan.  Willette Alma, AGPCNP-BC  Palliative Medicine Team/[facility]  *Please note that this is a verbal dictation therefore any spelling or grammatical errors are due to the "Dragon Medical One" system  interpretation.

## 2023-09-20 ENCOUNTER — Inpatient Hospital Stay (HOSPITAL_BASED_OUTPATIENT_CLINIC_OR_DEPARTMENT_OTHER): Payer: Medicare Other | Admitting: Nurse Practitioner

## 2023-09-20 ENCOUNTER — Encounter: Payer: Self-pay | Admitting: Nurse Practitioner

## 2023-09-20 DIAGNOSIS — M792 Neuralgia and neuritis, unspecified: Secondary | ICD-10-CM | POA: Diagnosis not present

## 2023-09-20 DIAGNOSIS — G893 Neoplasm related pain (acute) (chronic): Secondary | ICD-10-CM

## 2023-09-20 DIAGNOSIS — Z515 Encounter for palliative care: Secondary | ICD-10-CM

## 2023-09-20 DIAGNOSIS — C61 Malignant neoplasm of prostate: Secondary | ICD-10-CM | POA: Diagnosis not present

## 2023-09-20 DIAGNOSIS — C7951 Secondary malignant neoplasm of bone: Secondary | ICD-10-CM

## 2023-09-24 ENCOUNTER — Inpatient Hospital Stay: Payer: Medicare Other | Attending: Nurse Practitioner

## 2023-09-24 DIAGNOSIS — C7951 Secondary malignant neoplasm of bone: Secondary | ICD-10-CM | POA: Diagnosis present

## 2023-09-24 DIAGNOSIS — C61 Malignant neoplasm of prostate: Secondary | ICD-10-CM | POA: Insufficient documentation

## 2023-09-24 DIAGNOSIS — C7800 Secondary malignant neoplasm of unspecified lung: Secondary | ICD-10-CM | POA: Insufficient documentation

## 2023-09-24 LAB — CBC WITH DIFFERENTIAL (CANCER CENTER ONLY)
Abs Immature Granulocytes: 0 10*3/uL (ref 0.00–0.07)
Basophils Absolute: 0 10*3/uL (ref 0.0–0.1)
Basophils Relative: 0 %
Eosinophils Absolute: 0.2 10*3/uL (ref 0.0–0.5)
Eosinophils Relative: 5 %
HCT: 30.4 % — ABNORMAL LOW (ref 39.0–52.0)
Hemoglobin: 10 g/dL — ABNORMAL LOW (ref 13.0–17.0)
Immature Granulocytes: 0 %
Lymphocytes Relative: 31 %
Lymphs Abs: 1.1 10*3/uL (ref 0.7–4.0)
MCH: 36.2 pg — ABNORMAL HIGH (ref 26.0–34.0)
MCHC: 32.9 g/dL (ref 30.0–36.0)
MCV: 110.1 fL — ABNORMAL HIGH (ref 80.0–100.0)
Monocytes Absolute: 0.3 10*3/uL (ref 0.1–1.0)
Monocytes Relative: 10 %
Neutro Abs: 1.8 10*3/uL (ref 1.7–7.7)
Neutrophils Relative %: 54 %
Platelet Count: 181 10*3/uL (ref 150–400)
RBC: 2.76 MIL/uL — ABNORMAL LOW (ref 4.22–5.81)
RDW: 13.8 % (ref 11.5–15.5)
WBC Count: 3.4 10*3/uL — ABNORMAL LOW (ref 4.0–10.5)
nRBC: 0 % (ref 0.0–0.2)

## 2023-09-24 LAB — CMP (CANCER CENTER ONLY)
ALT: 10 U/L (ref 0–44)
AST: 20 U/L (ref 15–41)
Albumin: 4.3 g/dL (ref 3.5–5.0)
Alkaline Phosphatase: 57 U/L (ref 38–126)
Anion gap: 4 — ABNORMAL LOW (ref 5–15)
BUN: 21 mg/dL (ref 8–23)
CO2: 28 mmol/L (ref 22–32)
Calcium: 9.6 mg/dL (ref 8.9–10.3)
Chloride: 107 mmol/L (ref 98–111)
Creatinine: 1.23 mg/dL (ref 0.61–1.24)
GFR, Estimated: >60 mL/min (ref >60)
Glucose, Bld: 122 mg/dL — ABNORMAL HIGH (ref 70–99)
Potassium: 4.4 mmol/L (ref 3.5–5.1)
Sodium: 139 mmol/L (ref 135–145)
Total Bilirubin: 1 mg/dL (ref 0.3–1.2)
Total Protein: 6.6 g/dL (ref 6.5–8.1)

## 2023-09-24 NOTE — Progress Notes (Unsigned)
Palliative Medicine [facility]  Telephone:(336) [phone] Fax:(336) [phone]   Name: Frederick Ponce Date: 09/24/2023 MRN: [id number]  DOB: 05-Apr-1946  Patient Care Team: Arnette Felts, FNP as PCP - General (General Practice) Pickenpack-Cousar, Arty Baumgartner, NP as Nurse Practitioner (Nurse Practitioner) Duffy, Pascal Lux, LCSW as Social Worker (Licensed Clinical Social Worker)   REASON FOR CONSULTATION: Frederick Ponce is a 77 y.o. male with medical history including Parkinson's disease and hyperlipidemia. 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 He is having urgent radiation appointment due to iliac bone pain. Palliative ask to see for symptom management.   SOCIAL HISTORY:     reports that he has never smoked. He has never used smokeless tobacco. He reports current alcohol use of about 15.0 standard drinks of alcohol per week. He reports that he does not use drugs.  ADVANCE DIRECTIVES:  Patient is scheduled to complete advanced directives at our clinic here at the cancer center on 02/16/2022. MOST form provided.  Patient plans to review and complete at a later time.  CODE STATUS:   PAST MEDICAL HISTORY: Past Medical History:  Diagnosis Date   Arthritis    Depression    Family history of melanoma    Family history of prostate cancer    GERD (gastroesophageal reflux disease)    Hyperlipidemia    Hypertension    Parkinson disease    Prostate cancer (HCC)     PAST SURGICAL HISTORY: No past surgical history on file.  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  The report date is December 21, 2022.  The CancerNext-Expanded gene panel offered by [facility] and includes sequencing and rearrangement analysis for the following 77 genes: AIP, ALK, APC*, ATM*, AXIN2, BAP1, BARD1, BLM, BMPR1A, BRCA1*, BRCA2*, BRIP1*, CDC73, CDH1*, CDK4, CDKN1B, CDKN2A, CHEK2*, CTNNA1, DICER1, FANCC, FH, FLCN, GALNT12, KIF1B, LZTR1, MAX, MEN1, MET, MLH1*, MSH2*, MSH3, MSH6*, MUTYH*, NBN, NF1*, NF2, NTHL1, PALB2*, PHOX2B, PMS2*, POT1, PRKAR1A, PTCH1, PTEN*, RAD51C*, RAD51D*, RB1, RECQL, RET, SDHA, SDHAF2, SDHB, SDHC, SDHD, SMAD4, SMARCA4, SMARCB1, SMARCE1, STK11, SUFU, TMEM127, TP53*, TSC1, TSC2, VHL and XRCC2 (sequencing and deletion/duplication); EGFR, EGLN1, HOXB13, KIT, MITF, PDGFRA, POLD1, and POLE (sequencing only); EPCAM and GREM1 (deletion/duplication only). DNA and RNA analyses performed for * genes.      ALLERGIES:  is allergic to gabapentin and celebrex [celecoxib].  MEDICATIONS:  Current Outpatient Medications  Medication Sig Dispense Refill   acetaminophen (TYLENOL) 500 MG tablet Take 1,000 mg by mouth every 6 (six) hours as needed.     amLODipine (NORVASC) 5 MG tablet Take 1 tablet (5 mg total) by mouth daily. 90 tablet 1   atorvastatin (LIPITOR) 40 MG tablet TAKE ONE TABLET BY MOUTH EVERY MORNING 90 tablet 1   calcium-vitamin D (OSCAL WITH D) 500-5 MG-MCG tablet Take 1 tablet by mouth 2 (two) times daily. 60 tablet 3   carbidopa-levodopa (SINEMET) 25-100 MG tablet Take 1 tablet by mouth  4 (four) times daily. 360 tablet 3   COENZYME Q10 PO Take 1 capsule by mouth every morning.     fentaNYL (DURAGESIC) 25 MCG/HR Place 1 patch onto the skin every 3 (three) days. 10 patch 0   gabapentin (NEURONTIN) 300 MG capsule Take 1 capsule (300 mg total) by mouth at bedtime. 30  capsule 0   ibuprofen (ADVIL) 200 MG tablet Take 200 mg by mouth every 6 (six) hours as needed.     KRILL OIL PO Take 1 capsule by mouth every morning.     meloxicam (MOBIC) 7.5 MG tablet Take 1 tablet (7.5 mg total) by mouth daily. 60 tablet 1   multivitamin-lutein (OCUVITE-LUTEIN) CAPS capsule Take 1 capsule by mouth every morning.     rucaparib camsylate (RUBRACA) 300 MG tablet Take 1 tablet (300 mg total) by mouth 2 (two) times daily. 60 tablet 1   traMADol (ULTRAM) 50 MG tablet Take 1 tablet (50 mg total) by mouth every 6 (six) hours as needed. 30 tablet 0   vitamin B-12 (CYANOCOBALAMIN) 1000 MCG tablet Take 1,000 mcg by mouth every morning.     No current facility-administered medications for this visit.    PERFORMANCE STATUS (ECOG) : 1 - Symptomatic but completely ambulatory  Assessment NAD RRR Normal breathing pattern AAO x3  IMPRESSION: [name] presents to clinic today for symptom management follow-up. No acute distress. His husband is present with him. Denies nausea, vomiting, constipation, or diarrhea. Appetite is good. Is trying to take things one day at a time. Continues to be frustrated with his quality of life and leg discomfort.   Neoplasm related pain  [name] states his lower extremity discomfort continues to be a challenge for him in the late evening/night time hours and first thing in the morning. He was tolerating regimen without difficulty however after increasing gabapentin to 600mg  at bedtime noticed increased drowsiness in the morning lasting into the daytime.   [name] expresses he has not noticed much of a difference in his leg discomfort since starting gabapentin outside of his ability to gain a better night's sleep which he is actually appreciative of. This is indicative that he is not being awaken by as much discomfort. [name] on the other hand feels that he has noticed some improvement as patient is up and more readily about in the morning including mood.    [name] inquires about increasing fentanyl patch versus adjustments to gabapentin. He also speaks to great success of his bilateral leg discomfort after utilizing topical cream suggested by his sister. Nervive. He reports after use his pain had significantly decreased and he was able to do some of the things he chose to do.   Education provided on differences in pain use of gabapentin and fentanyl patch. I discussed given neuropathic pain in general we would not want to increase patch at this time as we have not optimize use of gabapentin. Patient has only attempted 300-600mg  at bedtime. Recommended decreasing dose back down to 300mg  at bedtime given he was able to tolerate with some noticeable difference with addition of 300mg  in the morning. Continue with use of topical Nervive cream. We will continue use of this regimen for a week and closely evaluate effectiveness. Patient and husband verbalized understanding.   PLAN:  Fentanyl 25 mcg patch.  Will monitor closely and titrate as needed. No changes at this time.  Gabapentin 300 mg at bedtime. Did not tolerate 600mg  at bedtime. May take 300mg  every morning.  Angus Seller  topical cream for lower extremity discomfort as needed.  Tylenol extra strength as needed Prune juice, tea, Dulcolax daily for bowel regimen  I will plan to follow up by phone in 1 week for close symptom management follow-up .  Patient expressed understanding and was in agreement with this plan. He also understands that He can call the clinic at any time with any questions, concerns, or complaints.    Any controlled substances utilized were prescribed in the context of palliative care. PDMP has been reviewed.    Visit consisted of counseling and education dealing with the complex and emotionally intense issues of symptom management and palliative care in the setting of serious and potentially life-threatening illness.Greater than 50%  of this time was spent counseling and coordinating  care related to the above assessment and plan.  Willette Alma, AGPCNP-BC  Palliative Medicine Team/[facility]  *Please note that this is a verbal dictation therefore any spelling or grammatical errors are due to the "Dragon Medical One" system interpretation.

## 2023-09-25 LAB — PROSTATE-SPECIFIC AG, SERUM (LABCORP): Prostate Specific Ag, Serum: 1.1 ng/mL (ref 0.0–4.0)

## 2023-09-26 ENCOUNTER — Encounter: Payer: Self-pay | Admitting: Nurse Practitioner

## 2023-09-26 ENCOUNTER — Inpatient Hospital Stay (HOSPITAL_BASED_OUTPATIENT_CLINIC_OR_DEPARTMENT_OTHER): Payer: Medicare Other | Admitting: Nurse Practitioner

## 2023-09-26 ENCOUNTER — Inpatient Hospital Stay: Payer: Medicare Other

## 2023-09-26 ENCOUNTER — Inpatient Hospital Stay (HOSPITAL_BASED_OUTPATIENT_CLINIC_OR_DEPARTMENT_OTHER): Payer: Medicare Other | Admitting: Hematology and Oncology

## 2023-09-26 VITALS — BP 136/72 | HR 88 | Temp 98.6°F | Resp 16 | Ht 68.0 in | Wt 177.8 lb

## 2023-09-26 DIAGNOSIS — R530 Neoplastic (malignant) related fatigue: Secondary | ICD-10-CM

## 2023-09-26 DIAGNOSIS — C61 Malignant neoplasm of prostate: Secondary | ICD-10-CM | POA: Diagnosis not present

## 2023-09-26 DIAGNOSIS — C7951 Secondary malignant neoplasm of bone: Secondary | ICD-10-CM

## 2023-09-26 DIAGNOSIS — G893 Neoplasm related pain (acute) (chronic): Secondary | ICD-10-CM | POA: Diagnosis not present

## 2023-09-26 DIAGNOSIS — M792 Neuralgia and neuritis, unspecified: Secondary | ICD-10-CM

## 2023-09-26 DIAGNOSIS — Z515 Encounter for palliative care: Secondary | ICD-10-CM | POA: Diagnosis not present

## 2023-09-26 MED ORDER — DENOSUMAB 120 MG/1.7ML ~~LOC~~ SOLN
120.0000 mg | Freq: Once | SUBCUTANEOUS | Status: AC
  Administered 2023-09-26: 120 mg via SUBCUTANEOUS
  Filled 2023-09-26: qty 1.7

## 2023-09-27 NOTE — Progress Notes (Signed)
Palliative Medicine [facility]  Telephone:(336) [phone] Fax:(336) [phone]   Name: Frederick Ponce Date: 09/27/2023 MRN: [id number]  DOB: Mar 29, 1946  Patient Care Team: Arnette Felts, FNP as PCP - General (General Practice) Pickenpack-Cousar, Arty Baumgartner, NP as Nurse Practitioner (Nurse Practitioner) Alisa Graff, Pascal Lux, LCSW as Social Worker (Licensed Clinical Social Worker)   I connected with Frederick Ponce on 09/27/23 at 10:30 AM EDT by phone and verified that I am speaking with the correct person using two identifiers.   I discussed the limitations, risks, security and privacy concerns of performing an evaluation and management service by telemedicine and the availability of in-person appointments. I also discussed with the patient that there may be a patient responsible charge related to this service. The patient expressed understanding and agreed to proceed.   Other persons participating in the visit and their role in the encounter: n/a   Patient's location: home  Provider's location: [facility]   Chief Complaint: f/u of symptom management   REASON FOR CONSULTATION: Frederick Ponce is a 77 y.o. male with medical history including Parkinson's disease and hyperlipidemia. 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 He is having urgent radiation appointment due to iliac bone pain. Palliative ask to see for symptom management.   SOCIAL HISTORY:     reports that he has never smoked. He has never used smokeless tobacco. He reports current alcohol use of about 15.0 standard drinks of alcohol per week. He reports that he does not use drugs.  ADVANCE DIRECTIVES:  Patient is scheduled to complete advanced directives at our clinic here at the cancer center on 02/16/2022. MOST form provided.  Patient plans to review and complete at a later time.  CODE STATUS:   PAST  MEDICAL HISTORY: Past Medical History:  Diagnosis Date   Arthritis    Depression    Family history of melanoma    Family history of prostate cancer    GERD (gastroesophageal reflux disease)    Hyperlipidemia    Hypertension    Parkinson disease (HCC)    Prostate cancer (HCC)     PAST SURGICAL HISTORY: No past surgical history on file.  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  The report date is December 21, 2022.  The CancerNext-Expanded gene panel offered by [facility] and includes sequencing and rearrangement analysis for the following 77 genes: AIP, ALK, APC*, ATM*, AXIN2, BAP1, BARD1, BLM, BMPR1A, BRCA1*, BRCA2*, BRIP1*, CDC73, CDH1*, CDK4, CDKN1B, CDKN2A, CHEK2*, CTNNA1, DICER1, FANCC, FH, FLCN, GALNT12, KIF1B, LZTR1, MAX, MEN1, MET, MLH1*, MSH2*, MSH3, MSH6*, MUTYH*, NBN, NF1*, NF2, NTHL1, PALB2*, PHOX2B, PMS2*, POT1, PRKAR1A, PTCH1, PTEN*, RAD51C*, RAD51D*, RB1, RECQL, RET, SDHA, SDHAF2, SDHB, SDHC, SDHD, SMAD4, SMARCA4, SMARCB1, SMARCE1, STK11, SUFU, TMEM127, TP53*, TSC1, TSC2, VHL and XRCC2 (sequencing and deletion/duplication); EGFR, EGLN1, HOXB13, KIT, MITF, PDGFRA, POLD1, and POLE (sequencing only); EPCAM and GREM1 (deletion/duplication only). DNA and RNA analyses performed for * genes.  ALLERGIES:  is allergic to gabapentin and celebrex [celecoxib].  MEDICATIONS:  Current Outpatient Medications  Medication Sig Dispense Refill   acetaminophen (TYLENOL) 500 MG tablet Take 1,000 mg by  mouth every 6 (six) hours as needed.     amLODipine (NORVASC) 5 MG tablet Take 1 tablet (5 mg total) by mouth daily. 90 tablet 1   atorvastatin (LIPITOR) 40 MG tablet TAKE ONE TABLET BY MOUTH EVERY MORNING 90 tablet 1   calcium-vitamin D (OSCAL WITH D) 500-5 MG-MCG tablet Take 1 tablet by mouth 2 (two) times daily. 60 tablet 3   carbidopa-levodopa (SINEMET) 25-100 MG tablet Take 1 tablet by mouth 4 (four) times daily. 360 tablet 3   COENZYME Q10 PO Take 1 capsule by mouth every morning.     fentaNYL (DURAGESIC) 25 MCG/HR Place 1 patch onto the skin every 3 (three) days. 10 patch 0   gabapentin (NEURONTIN) 300 MG capsule Take 1 capsule (300 mg total) by mouth at bedtime. 30 capsule 0   ibuprofen (ADVIL) 200 MG tablet Take 200 mg by mouth every 6 (six) hours as needed.     KRILL OIL PO Take 1 capsule by mouth every morning.     meloxicam (MOBIC) 7.5 MG tablet Take 1 tablet (7.5 mg total) by mouth daily. 60 tablet 1   multivitamin-lutein (OCUVITE-LUTEIN) CAPS capsule Take 1 capsule by mouth every morning.     rucaparib camsylate (RUBRACA) 300 MG tablet Take 1 tablet (300 mg total) by mouth 2 (two) times daily. 60 tablet 1   traMADol (ULTRAM) 50 MG tablet Take 1 tablet (50 mg total) by mouth every 6 (six) hours as needed. 30 tablet 0   vitamin B-12 (CYANOCOBALAMIN) 1000 MCG tablet Take 1,000 mcg by mouth every morning.     No current facility-administered medications for this visit.    PERFORMANCE STATUS (ECOG) : 1 - Symptomatic but completely ambulatory   IMPRESSION: I connected by phone with Frederick Ponce for symptom follow-up. No acute distress. Denies nausea, vomiting, constipation, or diarrhea. Overall is doing ok. He shares his overall concerns with regimen and trying to find a balance between his Parkinson symptoms, arthritis, and cancer related symptoms.   Neoplasm related pain  Frederick Ponce reports he continues to have some pain however he feels that his leg discomfort is much more manageable  since using the Nervive topical cream. He is able to do more activities during the day with less pain. Is able to use the cream in the morning which relief well into the afternoon. He is much appreciative of this.   Frederick Ponce shares he self discontinued use of gabapentin on last Thursday October 3rd. Although Frederick Ponce feels he saw improvement patient did not himself feel this way. We will discontinue use as he has already done with on authorized refills.   At this time Frederick Ponce would like to take things one day at a time and continue with fentanyl patch and use of topical cream. He will notify the office with any changes or needs.    PLAN:  Fentanyl 25 mcg patch.  Will monitor closely and titrate as needed. No changes at this time.  Gabapentin discontinued by patient on 10/3. We will officially discontinue use with no plans for refills.  Nervive topical cream for lower extremity discomfort as needed. Patient feels most improvement with use.  Tylenol extra strength as needed Prune juice, tea, Dulcolax daily for bowel regimen  I will plan to follow up with patient in 2-3 weeks in  collaboration with Oncology appointments. Patient knows to contact office if needed sooner.   Patient expressed understanding and was in agreement with this plan. He also understands that He can call the clinic at any time with any questions, concerns, or complaints.    Any controlled substances utilized were prescribed in the context of palliative care. PDMP has been reviewed.    Visit consisted of counseling and education dealing with the complex and emotionally intense issues of symptom management and palliative care in the setting of serious and potentially life-threatening illness.Greater than 50%  of this time was spent counseling and coordinating care related to the above assessment and plan.  Willette Alma, AGPCNP-BC  Palliative Medicine Team/[facility]  *Please note that this is a verbal  dictation therefore any spelling or grammatical errors are due to the "Dragon Medical One" system interpretation.

## 2023-10-04 ENCOUNTER — Inpatient Hospital Stay: Payer: Medicare Other | Admitting: Nurse Practitioner

## 2023-10-04 ENCOUNTER — Encounter: Payer: Self-pay | Admitting: Nurse Practitioner

## 2023-10-04 DIAGNOSIS — Z515 Encounter for palliative care: Secondary | ICD-10-CM | POA: Diagnosis not present

## 2023-10-04 DIAGNOSIS — C61 Malignant neoplasm of prostate: Secondary | ICD-10-CM | POA: Diagnosis not present

## 2023-10-04 DIAGNOSIS — M792 Neuralgia and neuritis, unspecified: Secondary | ICD-10-CM | POA: Diagnosis not present

## 2023-10-04 DIAGNOSIS — C7951 Secondary malignant neoplasm of bone: Secondary | ICD-10-CM

## 2023-10-07 ENCOUNTER — Encounter: Payer: Self-pay | Admitting: Hematology and Oncology

## 2023-10-07 ENCOUNTER — Other Ambulatory Visit: Payer: Self-pay | Admitting: Nurse Practitioner

## 2023-10-07 DIAGNOSIS — C61 Malignant neoplasm of prostate: Secondary | ICD-10-CM

## 2023-10-07 DIAGNOSIS — G893 Neoplasm related pain (acute) (chronic): Secondary | ICD-10-CM

## 2023-10-07 DIAGNOSIS — Z515 Encounter for palliative care: Secondary | ICD-10-CM

## 2023-10-07 MED ORDER — FENTANYL 25 MCG/HR TD PT72: 1.0000 | MEDICATED_PATCH | TRANSDERMAL | 0 refills | Status: DC

## 2023-10-07 NOTE — Progress Notes (Signed)
[facility] [phone]   [phone]  PROGRESS NOTE  Patient Care Team: Arnette Felts, FNP as PCP - General (General Practice) Pickenpack-Cousar, Arty Baumgartner, NP as Nurse Practitioner (Nurse Practitioner) Alisa Graff, Pascal Lux, LCSW as Social Worker (Licensed Clinical Social Worker)  Hematological/Oncological History # Metastatic castrate sensitive prostate cancer. 01/15/22: CT-guided biopsy of a pulmonary nodule confirmed the presence of prostate cancer 01/23/2022:  Firmagon 240 mg started  Feb 2023: radiation therapy to the left sacroiliac pelvic bones completed. He received 30 Gray in 10 fractions  03/06/2022: Abiraterone 1000 mg daily with prednisone 5 mg daily started  01/03/2023: last visit with Dr. Clelia Croft. Restarted 500 mg abiraterone daily.  02/06/2023: transfer care to Dr. Leonides Schanz. Increased dose to 1000 mg abiraterone daily.  04/11/2023: Due to increasing PSA transition to rucaparib 600 mg twice daily 07/03/2023: Held Rucaparib due to neutropenia and anemia.  07/31/2023: Resume Rucaparib 300 mg PO BID.   Interval History:  Frederick Ponce 77 y.o. 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 The patient's last visit was on 08/29/2023. In the interim, he has continued Rucaparib.  Today he is accompanied by his husband.  On exam today Frederick Ponce reports that he "feels like crap".  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  He reports that sensation is namely the issue and he is not having any frank pain.  He reports that he is not having any stumbling or falls.  He reports his appetite is improved and he has gained about 3 pounds in the last week.  He notes that he is having some increased shortness of breath and some increased fluid.  He reports that he is not having any bleeding, bruising, or dark stools.  He denies any  nausea, vomiting, or diarrhea.  He continues taking his Rubraca faithfully on a daily basis.  He denies fevers, chills, sweats, shortness of breath, chest pain or cough.  A full 10 point ROS was otherwise negative.   MEDICAL HISTORY:  Past Medical History:  Diagnosis Date   Arthritis    Depression    Family history of melanoma    Family history of prostate cancer    GERD (gastroesophageal reflux disease)    Hyperlipidemia    Hypertension    Parkinson disease (HCC)    Prostate cancer (HCC)     SURGICAL HISTORY: No past surgical history on file.  SOCIAL HISTORY: Social History   Socioeconomic History   Marital status: Married    Spouse name: Janae Sauce   Number of children: 0   Years of education: Not on file   Highest education level: Associate degree: academic program  Occupational History   Not on file  Tobacco Use   Smoking status: Never   Smokeless tobacco: Never  Vaping Use   Vaping status: Never Used  Substance and Sexual Activity   Alcohol use: Yes    Alcohol/week: 15.0 standard drinks of alcohol    Types: 15 Glasses of wine per week    Comment: couple glasses a night   Drug use: Never   Sexual activity: Not Currently    Birth control/protection: None  Other Topics Concern   Not on file  Social History Narrative   Not on file   Social Determinants of Health   Financial Resource Strain: Low Risk  (08/09/2023)   Overall Physicist, medical Strain (  CARDIA)    Difficulty of Paying Living Expenses: Not hard at all  Food Insecurity: No Food Insecurity (08/09/2023)   Hunger Vital Sign    Worried About Running Out of Food in the Last Year: Never true    Ran Out of Food in the Last Year: Never true  Transportation Needs: No Transportation Needs (08/09/2023)   PRAPARE - Administrator, Civil Service (Medical): No    Lack of Transportation (Non-Medical): No  Physical Activity: Inactive (08/09/2023)   Exercise Vital Sign    Days of Exercise per Week:  0 days    Minutes of Exercise per Session: 0 min  Stress: No Stress Concern Present (08/09/2023)   [employer] of Occupational Health - Occupational Stress Questionnaire    Feeling of Stress : Only a little  Social Connections: Moderately Isolated (08/09/2023)   Social Connection and Isolation Panel [NHANES]    Frequency of Communication with Friends and Family: More than three times a week    Frequency of Social Gatherings with Friends and Family: Once a week    Attends Religious Services: Never    Database administrator or Organizations: No    Attends Engineer, structural: Not on file    Marital Status: Married  Catering manager Violence: Not At Risk (10/12/2022)   Humiliation, Afraid, Rape, and Kick questionnaire    Fear of Current or Ex-Partner: No    Emotionally Abused: No    Physically Abused: No    Sexually Abused: No    FAMILY HISTORY: Family History  Problem Relation Age of Onset   Kidney disease Mother    Colon cancer Mother    Heart disease Father    Tremor Father    Parkinson's disease Father    Prostate cancer Father    Melanoma Sister    Parkinson's disease Sister    Melanoma Sister        melanoma x2   Prostate cancer Brother    Parkinson's disease Paternal Grandmother    Lung cancer Paternal Grandfather    Parkinson's disease Paternal Aunt    Lung cancer Paternal Aunt     ALLERGIES:  is allergic to gabapentin and celebrex [celecoxib].  MEDICATIONS:  Current Outpatient Medications  Medication Sig Dispense Refill   acetaminophen (TYLENOL) 500 MG tablet Take 1,000 mg by mouth every 6 (six) hours as needed.     amLODipine (NORVASC) 5 MG tablet Take 1 tablet (5 mg total) by mouth daily. 90 tablet 1   atorvastatin (LIPITOR) 40 MG tablet TAKE ONE TABLET BY MOUTH EVERY MORNING 90 tablet 1   calcium-vitamin D (OSCAL WITH D) 500-5 MG-MCG tablet Take 1 tablet by mouth 2 (two) times daily. 60 tablet 3   carbidopa-levodopa (SINEMET) 25-100 MG tablet  Take 1 tablet by mouth 4 (four) times daily. 360 tablet 3   COENZYME Q10 PO Take 1 capsule by mouth every morning.     fentaNYL (DURAGESIC) 25 MCG/HR Place 1 patch onto the skin every 3 (three) days. 10 patch 0   gabapentin (NEURONTIN) 300 MG capsule Take 1 capsule (300 mg total) by mouth at bedtime. 30 capsule 0   ibuprofen (ADVIL) 200 MG tablet Take 200 mg by mouth every 6 (six) hours as needed.     KRILL OIL PO Take 1 capsule by mouth every morning.     meloxicam (MOBIC) 7.5 MG tablet Take 1 tablet (7.5 mg total) by mouth daily. 60 tablet 1   multivitamin-lutein (OCUVITE-LUTEIN) CAPS capsule  Take 1 capsule by mouth every morning.     rucaparib camsylate (RUBRACA) 300 MG tablet Take 1 tablet (300 mg total) by mouth 2 (two) times daily. 60 tablet 1   traMADol (ULTRAM) 50 MG tablet Take 1 tablet (50 mg total) by mouth every 6 (six) hours as needed. 30 tablet 0   vitamin B-12 (CYANOCOBALAMIN) 1000 MCG tablet Take 1,000 mcg by mouth every morning.     No current facility-administered medications for this visit.    REVIEW OF SYSTEMS:   Constitutional: ( - ) fevers, ( - )  chills , ( - ) night sweats Eyes: ( - ) blurriness of vision, ( - ) double vision, ( - ) watery eyes Ears, nose, mouth, throat, and face: ( - ) mucositis, ( - ) sore throat Respiratory: ( - ) cough, ( - ) dyspnea, ( - ) wheezes Cardiovascular: ( - ) palpitation, ( - ) chest discomfort, ( - ) lower extremity swelling Gastrointestinal:  ( - ) nausea, ( - ) heartburn, ( - ) change in bowel habits Skin: ( - ) abnormal skin rashes Lymphatics: ( - ) new lymphadenopathy, ( - ) easy bruising Neurological: ( - ) numbness, ( - ) tingling, ( - ) new weaknesses Behavioral/Psych: ( - ) mood change, ( - ) new changes  All other systems were reviewed with the patient and are negative.  PHYSICAL EXAMINATION: ECOG PERFORMANCE STATUS: 1 - Symptomatic but completely ambulatory  There were no vitals filed for this visit.      There  were no vitals filed for this visit.   GENERAL: Well-appearing elderly Caucasian male, alert, no distress and comfortable SKIN: skin color, texture, turgor are normal, no rashes or significant lesions EYES: conjunctiva are pink and non-injected, sclera clear LUNGS: clear to auscultation and percussion with normal breathing effort HEART: regular rate & rhythm and no murmurs and no lower extremity edema Musculoskeletal: no cyanosis of digits and no clubbing  PSYCH: alert & oriented x 3, fluent speech NEURO: no focal motor/sensory deficits  LABORATORY DATA:  I have reviewed the data as listed    Latest Ref Rng & Units 09/24/2023    3:19 PM 09/12/2023    2:59 PM 08/27/2023    3:47 PM  CBC  WBC 4.0 - 10.5 K/uL 3.4  3.3  2.9   Hemoglobin 13.0 - 17.0 g/dL 78.2  95.6  9.8   Hematocrit 39.0 - 52.0 % 30.4  30.6  28.7   Platelets 150 - 400 K/uL 181  160  158        Latest Ref Rng & Units 09/24/2023    3:19 PM 09/12/2023    2:59 PM 08/27/2023    3:47 PM  CMP  Glucose 70 - 99 mg/dL 213  086  578   BUN 8 - 23 mg/dL 21  24  20    Creatinine 0.61 - 1.24 mg/dL 4.69  6.29  5.28   Sodium 135 - 145 mmol/L 139  140  138   Potassium 3.5 - 5.1 mmol/L 4.4  4.3  4.3   Chloride 98 - 111 mmol/L 107  108  106   CO2 22 - 32 mmol/L 28  27  27    Calcium 8.9 - 10.3 mg/dL 9.6  9.6  9.4   Total Protein 6.5 - 8.1 g/dL 6.6  6.6  6.7   Total Bilirubin 0.3 - 1.2 mg/dL 1.0  1.0  1.3   Alkaline Phos 38 - 126 U/L 57  49  47   AST 15 - 41 U/L 20  18  17    ALT 0 - 44 U/L 10  11  6      Lab Results  Component Value Date   MPROTEIN Not Observed 12/27/2021   Lab Results  Component Value Date   KPAFRELGTCHN 16.0 12/27/2021   LAMBDASER 12.2 12/27/2021   KAPLAMBRATIO 1.31 12/27/2021     RADIOGRAPHIC STUDIES: No results found.  ASSESSMENT & PLAN Frederick Ponce is a 77 y.o. 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  # Metastatic castrate sensitive prostate cancer. # Prostate cancer metastatic to the lung --Labs on 09/24/2023 show white blood cell 3.4, hemoglobin 10.0, MCV 110.1, and platelets of 181 --Reviewed CT CAP from 07/18/2023 that showed treatment response.  --continue Rucaparib at 300 mg p.o. twice daily. --Last dose of Eligard on 08/19/2023. Next dose in 4 months (Dec 2024).  --Proceed with Xgeva injection monthly. Next one due today.  --RTC in 4 weeks for labs/follow up  No orders of the defined types were placed in this encounter.   All questions were answered. The patient knows to call the clinic with any problems, questions or concerns.  I have spent a total of 30 minutes minutes of face-to-face and non-face-to-face time, preparing to see the patient, performing a medically appropriate examination, counseling and educating the patient, documenting clinical information in the electronic health record, independently interpreting results and communicating results to the patient, and care coordination.   Ulysees Barns, MD Department of Hematology/Oncology [facility] Phone: [phone] Pager: [phone] Email: [email or web address]   10/07/2023 7:04 PM

## 2023-10-10 ENCOUNTER — Telehealth: Payer: Self-pay | Admitting: Radiation Oncology

## 2023-10-10 ENCOUNTER — Telehealth: Payer: Self-pay | Admitting: Neurology

## 2023-10-10 NOTE — Telephone Encounter (Signed)
I am not clear that these spells are PD related if he feels cold and his teeth chatter, he also mentions these are different from his typical PD symptoms . He has significant underlying chronic pain and I wonder if is pain under poor control when these spells happen. For now, I would continue with current PD medications, we had discussed adding in a 5th dose if needed, but would hold off for now. thanks

## 2023-10-10 NOTE — Telephone Encounter (Signed)
Call to patient, no answer. Advised I would send a mychart message.

## 2023-10-10 NOTE — Telephone Encounter (Signed)
Patient returned my call so no mychart message sent. I inquired on when patient had started taking 5 doses of sinemet and he stated after his last visit with Maralyn Sago. I reviewed that she said that was on the table for discussion in the future but nothing was prescribed that way.I asked him about following up with PCP and oncology for any other issue and  I also  inquired about any underlying pain and patient said he sorry that he was honest with me in the beginning. He stated that over the last 3 weeks he had taken some of his gabapentin to help with the pain but that didn't help so he resorted to 3 left over 15 mg ER morphine tablets that he had and that each time he took the tablet the tremors and coldness and pain went away. He also  has a fentanyl patch. I asked him to who prescribed the morphine and fentanyl and he stated that he is under palliative care. I again inquired that his typical PD symptoms were under control and he stated yes. I advised that I would send this to Maralyn Sago as an Lorain Childes that he is taking 5 doses but that she will not be managing this type of pain or pain medication.and he should contact palliative care providers,  He verbalized understanding and was very appreciative of time and phone call.

## 2023-10-10 NOTE — Telephone Encounter (Signed)
Called patient to schedule a consult w. Ashlyn. No answer, LVM for a return call.

## 2023-10-10 NOTE — Telephone Encounter (Signed)
Pt called needing to speak to the Rn regarding some shakes and temp changes he has been having for 3 weeks. I asked pt if he had spoke to his PCP regarding this and pt stated that I do not know what I'm talking about and he just needs the RN or Provider to call him to discuss this problem.

## 2023-10-10 NOTE — Telephone Encounter (Signed)
Call to patient, he reports that for the last 2-3 weeks he has been having random periods where he is "freezing" and begins tremor from being so cold and his teeth chatter. He reports that this is different from that he is taking his parkinsons tremor and those are well controlled. He reports taking his sinemet every 4 hours. He does report having hot flashes as well from lack of testosterone form cancer treatments. He denies and recent illness or infection. Advised I would send to Maralyn Sago for advice or recommendations.Patient appreciative of call

## 2023-10-11 ENCOUNTER — Telehealth: Payer: Self-pay | Admitting: Radiation Oncology

## 2023-10-11 ENCOUNTER — Inpatient Hospital Stay: Payer: Medicare Other | Admitting: Nurse Practitioner

## 2023-10-11 DIAGNOSIS — G893 Neoplasm related pain (acute) (chronic): Secondary | ICD-10-CM | POA: Diagnosis not present

## 2023-10-11 DIAGNOSIS — C61 Malignant neoplasm of prostate: Secondary | ICD-10-CM

## 2023-10-11 DIAGNOSIS — C7951 Secondary malignant neoplasm of bone: Secondary | ICD-10-CM

## 2023-10-11 DIAGNOSIS — Z515 Encounter for palliative care: Secondary | ICD-10-CM

## 2023-10-11 DIAGNOSIS — R530 Neoplastic (malignant) related fatigue: Secondary | ICD-10-CM | POA: Diagnosis not present

## 2023-10-11 MED ORDER — MORPHINE SULFATE 15 MG PO TABS
15.0000 mg | ORAL_TABLET | Freq: Three times a day (TID) | ORAL | 0 refills | Status: DC
Start: 2023-10-11 — End: 2023-11-07

## 2023-10-11 NOTE — Telephone Encounter (Signed)
Called patient to schedule a consultation w. Dr. Manning. No answer, LVM for a return call.  ?

## 2023-10-11 NOTE — Progress Notes (Signed)
Palliative Medicine [facility]  Telephone:([phone] Fax:(336) [phone]   Name: Frederick Ponce Date: 10/11/2023 MRN: [id number]  DOB: 1945/12/31  Patient Care Team: Arnette Felts, FNP as PCP - General (General Practice) Pickenpack-Cousar, Arty Baumgartner, NP as Nurse Practitioner (Nurse Practitioner) Alisa Graff, Pascal Lux, LCSW as Social Worker (Licensed Clinical Social Worker)   I connected with Frederick Ponce on 10/11/23 at  9:00 AM EDT by phone and verified that I am speaking with the correct person using two identifiers.   I discussed the limitations, risks, security and privacy concerns of performing an evaluation and management service by telemedicine and the availability of in-person appointments. I also discussed with the patient that there may be a patient responsible charge related to this service. The patient expressed understanding and agreed to proceed.   Other persons participating in the visit and their role in the encounter: n/a   Patient's location: home  Provider's location: [facility]   Chief Complaint: f/u of symptom management   REASON FOR CONSULTATION: Frederick Ponce is a 77 y.o. male with medical history including Parkinson's disease and hyperlipidemia. 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 He is having urgent radiation appointment due to iliac bone pain. Palliative ask to see for symptom management.   SOCIAL HISTORY:     reports that he has never smoked. He has never used smokeless tobacco. He reports current alcohol use of about 15.0 standard drinks of alcohol per week. He reports that he does not use drugs.  ADVANCE DIRECTIVES:  Patient is scheduled to complete advanced directives at our clinic here at the cancer center on 02/16/2022. MOST form provided.  Patient plans to review and complete at a later time.  CODE STATUS:    PAST MEDICAL HISTORY: Past Medical History:  Diagnosis Date   Arthritis    Depression    Family history of melanoma    Family history of prostate cancer    GERD (gastroesophageal reflux disease)    Hyperlipidemia    Hypertension    Parkinson disease (HCC)    Prostate cancer (HCC)     PAST SURGICAL HISTORY: No past surgical history on file.  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  The report date is December 21, 2022.  The CancerNext-Expanded gene panel offered by [facility] and includes sequencing and rearrangement analysis for the following 77 genes: AIP, ALK, APC*, ATM*, AXIN2, BAP1, BARD1, BLM, BMPR1A, BRCA1*, BRCA2*, BRIP1*, CDC73, CDH1*, CDK4, CDKN1B, CDKN2A, CHEK2*, CTNNA1, DICER1, FANCC, FH, FLCN, GALNT12, KIF1B, LZTR1, MAX, MEN1, MET, MLH1*, MSH2*, MSH3, MSH6*, MUTYH*, NBN, NF1*, NF2, NTHL1, PALB2*, PHOX2B, PMS2*, POT1, PRKAR1A, PTCH1, PTEN*, RAD51C*, RAD51D*, RB1, RECQL, RET, SDHA, SDHAF2, SDHB, SDHC, SDHD, SMAD4, SMARCA4, SMARCB1, SMARCE1, STK11, SUFU, TMEM127, TP53*, TSC1, TSC2, VHL and XRCC2 (sequencing and deletion/duplication); EGFR, EGLN1, HOXB13, KIT, MITF, PDGFRA, POLD1, and POLE (sequencing only); EPCAM and GREM1 (deletion/duplication only). DNA and RNA analyses performed for * genes.  ALLERGIES:  is allergic to gabapentin and celebrex [celecoxib].  MEDICATIONS:  Current Outpatient Medications  Medication Sig Dispense Refill   acetaminophen (TYLENOL) 500 MG tablet Take  1,000 mg by mouth every 6 (six) hours as needed.     amLODipine (NORVASC) 5 MG tablet Take 1 tablet (5 mg total) by mouth daily. 90 tablet 1   atorvastatin (LIPITOR) 40 MG tablet TAKE ONE TABLET BY MOUTH EVERY MORNING 90 tablet 1   calcium-vitamin D (OSCAL WITH D) 500-5 MG-MCG tablet Take 1 tablet by mouth 2 (two) times daily. 60 tablet 3   carbidopa-levodopa (SINEMET) 25-100 MG tablet Take 1 tablet by mouth 4 (four) times daily. 360 tablet 3   COENZYME Q10 PO Take 1 capsule by mouth every morning.     fentaNYL (DURAGESIC) 25 MCG/HR Place 1 patch onto the skin every 3 (three) days. 10 patch 0   gabapentin (NEURONTIN) 300 MG capsule Take 1 capsule (300 mg total) by mouth at bedtime. 30 capsule 0   ibuprofen (ADVIL) 200 MG tablet Take 200 mg by mouth every 6 (six) hours as needed.     KRILL OIL PO Take 1 capsule by mouth every morning.     meloxicam (MOBIC) 7.5 MG tablet Take 1 tablet (7.5 mg total) by mouth daily. 60 tablet 1   multivitamin-lutein (OCUVITE-LUTEIN) CAPS capsule Take 1 capsule by mouth every morning.     rucaparib camsylate (RUBRACA) 300 MG tablet Take 1 tablet (300 mg total) by mouth 2 (two) times daily. 60 tablet 1   traMADol (ULTRAM) 50 MG tablet Take 1 tablet (50 mg total) by mouth every 6 (six) hours as needed. 30 tablet 0   vitamin B-12 (CYANOCOBALAMIN) 1000 MCG tablet Take 1,000 mcg by mouth every morning.     No current facility-administered medications for this visit.    PERFORMANCE STATUS (ECOG) : 1 - Symptomatic but completely ambulatory   IMPRESSION: I connected by phone with Frederick Ponce for symptom management. He is reporting ongoing uncontrolled symptoms specifically tremors, cold feeling, and discomfort.   He has a history of Parkinson's disease and chronic pain. Recent discussion with Neurology team who deferred as did not feel symptoms were related to his Parkinson but his cancer related care and/or pain. Frederick Ponce reports persistent, generalized body ache described as  similar to flu-like symptoms or feeling like they've been hit by a truck. This discomfort is not localized to any specific area and is likened to an overall feeling of being 'beat up.' The patient reports feeling extremely tired, yet struggles to sleep due to the pervasive ache.  Despite being on a fentanyl patch for pain management, reports that the medication does not fully alleviate their symptoms, leaving a 'gap' of unmanaged discomfort. He recalls that a previous trial of morphine provided significant relief, describing a return to a 'reasonable place of feeling decent.' However, also aware that he is not to be taking previously prescribed MS Contin with Fentanyl patch in place as this medication has been discontinued. We discussed both medications are similar and I re-emphasized for safety this will not be prescribed together.   In addition to the generalized ache, the patient has been experiencing internal cold sensations and tremors, which they attribute to their Parkinson's disease. These symptoms have been particularly distressing, with the patient describing feeling 'cold inside my body' and needing to take hot showers to alleviate the discomfort. These symptoms have been ongoing for the past two weeks and are not relieved by the fentanyl patch.  His leg pain is controlled with Nervive cream. Self discontinued gabapentin as he felt no relief or difference despite husband feeling differently.   The patient also reports a history of alcohol withdrawal many years ago and notes that their current symptoms are reminiscent of that experience. However, they deny current excessive alcohol consumption.  I had an open and honest discussion with Frederick Ponce regarding his symptom management and ways I would manage his pain which currently leg pain is controlled.   Despite these challenges, the patient is actively seeking solutions to manage his symptoms and improve his quality of life. Frederick Ponce express a  willingness to adjust his pain management regimen, including potentially increasing the dosage of the fentanyl patch or trying a different medication.  Education provided on use of breakthrough medication which he has not been using versus increasing Fentanyl patch. This is with concern that his reports are related to a rigors versus pain but with generalized aches from muscle tension. After extensive discussion he will trial MS IR for breakthrough pain.   All questions answered and support provided.    Assessment & Plan  Chronic Pain Generalized body aches described as flu-like symptoms, not relieved by current Fentanyl patch. Previous trial of Morphine ER provided relief. Patient reports feeling cold internally, which is not typically associated with pain. Offered to discontinue Fentanyl patch and restart morphine. Declined.  -Continue Fentanyl patch. -Prescribe immediate release Morphine 15mg  every 8 hours as needed for pain. -Advise patient to stay at home, no driving, operating heavy machinery after  due to potential drowsiness. -Plan to follow up with patient next Thursday to assess response to new medication.  Parkinson's Disease Patient reports tremors, which are is not reported to be attributed to Parkinson's disease. No changes in management discussed. -Continue current management per Neurology  General Health Maintenance / Followup Plans -Follow up call next Thursday to assess response to new pain management regimen. Patient knows to contact office sooner if needed.   Patient expressed understanding and was in agreement with this plan. He also understands that He can call the clinic at any time with any questions, concerns, or complaints.    Any controlled substances utilized were prescribed in the context of palliative care. PDMP has been reviewed.    Visit consisted of counseling and education dealing with the complex and emotionally intense issues of symptom management and  palliative care in the setting of serious and potentially life-threatening illness.  Frederick Ponce, AGPCNP-BC  Palliative Medicine Team/[facility]  *Please note that this is a verbal dictation therefore any spelling or grammatical errors are due to the "[employer]agon [employer] One" system interpretation.

## 2023-10-12 ENCOUNTER — Other Ambulatory Visit: Payer: Self-pay | Admitting: Nurse Practitioner

## 2023-10-12 DIAGNOSIS — M792 Neuralgia and neuritis, unspecified: Secondary | ICD-10-CM

## 2023-10-12 DIAGNOSIS — C61 Malignant neoplasm of prostate: Secondary | ICD-10-CM

## 2023-10-16 NOTE — Progress Notes (Unsigned)
Palliative Medicine [facility]  [phone] [phone]   Name: Frederick Ponce Date: 10/16/2023 MRN: [id number]  DOB: 16-Feb-1946  Patient Care Team: Arnette Felts, FNP as PCP - General (General Practice) Pickenpack-Cousar, Arty Baumgartner, NP as Nurse Practitioner (Nurse Practitioner) Alisa Graff, Pascal Lux, LCSW as Social Worker (Licensed Clinical Social Worker) Margaretmary Dys, MD as Consulting Physician (Radiation Oncology)   I connected with Frederick Ponce on 10/16/23 at 11:00 AM EDT by phone and verified that I am speaking with the correct person using two identifiers.   I discussed the limitations, risks, security and privacy concerns of performing an evaluation and management service by telemedicine and the availability of in-person appointments. I also discussed with the patient that there may be a patient responsible charge related to this service. The patient expressed understanding and agreed to proceed.   Other persons participating in the visit and their role in the encounter: n/a   Patient's location: home  Provider's location: [facility]   Chief Complaint: f/u of symptom management   REASON FOR CONSULTATION: Frederick Ponce is a 77 y.o. male with medical history including Parkinson's disease and hyperlipidemia. 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 He is having urgent radiation appointment due to iliac bone pain. Palliative ask to see for symptom management.   SOCIAL HISTORY:     reports that he has never smoked. He has never used smokeless tobacco. He reports current alcohol use of about 15.0 standard drinks of alcohol per week. He reports that he does not use drugs.  ADVANCE DIRECTIVES:  Patient is scheduled to complete advanced directives at our clinic here at the cancer center on 02/16/2022. MOST form provided.  Patient  plans to review and complete at a later time.  CODE STATUS:   PAST MEDICAL HISTORY: Past Medical History:  Diagnosis Date   Arthritis    Depression    Family history of melanoma    Family history of prostate cancer    GERD (gastroesophageal reflux disease)    Hyperlipidemia    Hypertension    Parkinson disease (HCC)    Prostate cancer (HCC)     PAST SURGICAL HISTORY: No past surgical history on file.  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  The report date is December 21, 2022.  The CancerNext-Expanded gene panel offered by [facility] and includes sequencing and rearrangement analysis for the following 77 genes: AIP, ALK, APC*, ATM*, AXIN2, BAP1, BARD1, BLM, BMPR1A, BRCA1*, BRCA2*, BRIP1*, CDC73, CDH1*, CDK4, CDKN1B, CDKN2A, CHEK2*, CTNNA1, DICER1, FANCC, FH, FLCN, GALNT12, KIF1B, LZTR1, MAX, MEN1, MET, MLH1*, MSH2*, MSH3, MSH6*, MUTYH*, NBN, NF1*, NF2, NTHL1, PALB2*, PHOX2B, PMS2*, POT1, PRKAR1A, PTCH1, PTEN*, RAD51C*, RAD51D*, RB1, RECQL, RET, SDHA, SDHAF2, SDHB, SDHC, SDHD, SMAD4, SMARCA4, SMARCB1, SMARCE1, STK11, SUFU, TMEM127, TP53*, TSC1, TSC2, VHL and XRCC2 (sequencing and deletion/duplication); EGFR, EGLN1, HOXB13, KIT, MITF, PDGFRA, POLD1, and POLE (sequencing only); EPCAM and GREM1 (deletion/duplication only). DNA  and RNA analyses performed for * genes.      ALLERGIES:  is allergic to gabapentin and celebrex [celecoxib].  MEDICATIONS:  Current Outpatient Medications  Medication Sig  Dispense Refill   acetaminophen (TYLENOL) 500 MG tablet Take 1,000 mg by mouth every 6 (six) hours as needed.     amLODipine (NORVASC) 5 MG tablet Take 1 tablet (5 mg total) by mouth daily. 90 tablet 1   atorvastatin (LIPITOR) 40 MG tablet TAKE ONE TABLET BY MOUTH EVERY MORNING 90 tablet 1   calcium-vitamin D (OSCAL WITH D) 500-5 MG-MCG tablet Take 1 tablet by mouth 2 (two) times daily. 60 tablet 3   carbidopa-levodopa (SINEMET) 25-100 MG tablet Take 1 tablet by mouth 4 (four) times daily. 360 tablet 3   COENZYME Q10 PO Take 1 capsule by mouth every morning.     fentaNYL (DURAGESIC) 25 MCG/HR Place 1 patch onto the skin every 3 (three) days. 10 patch 0   gabapentin (NEURONTIN) 300 MG capsule Take 1 capsule (300 mg total) by mouth at bedtime. 30 capsule 0   ibuprofen (ADVIL) 200 MG tablet Take 200 mg by mouth every 6 (six) hours as needed.     KRILL OIL PO Take 1 capsule by mouth every morning.     meloxicam (MOBIC) 7.5 MG tablet Take 1 tablet (7.5 mg total) by mouth daily. 60 tablet 1   morphine (MSIR) 15 MG tablet Take 1 tablet (15 mg total) by mouth every 8 (eight) hours. 21 tablet 0   multivitamin-lutein (OCUVITE-LUTEIN) CAPS capsule Take 1 capsule by mouth every morning.     rucaparib camsylate (RUBRACA) 300 MG tablet Take 1 tablet (300 mg total) by mouth 2 (two) times daily. 60 tablet 1   traMADol (ULTRAM) 50 MG tablet Take 1 tablet (50 mg total) by mouth every 6 (six) hours as needed. 30 tablet 0   vitamin B-12 (CYANOCOBALAMIN) 1000 MCG tablet Take 1,000 mcg by mouth every morning.     No current facility-administered medications for this visit.    PERFORMANCE STATUS (ECOG) : 1 - Symptomatic but completely ambulatory   IMPRESSION: I connected by phone with Frederick Ponce for symptom management. He is reporting ongoing uncontrolled symptoms specifically tremors, cold feeling, and discomfort.   He has a history of Parkinson's disease and chronic pain. Recent discussion with Neurology team  who deferred as did not feel symptoms were related to his Parkinson but his cancer related care and/or pain. Frederick Ponce reports persistent, generalized body ache described as similar to flu-like symptoms or feeling like they've been hit by a truck. This discomfort is not localized to any specific area and is likened to an overall feeling of being 'beat up.' The patient reports feeling extremely tired, yet struggles to sleep due to the pervasive ache.  Despite being on a fentanyl patch for pain management, reports that the medication does not fully alleviate their symptoms, leaving a 'gap' of unmanaged discomfort. He recalls that a previous trial of morphine provided significant relief, describing a return to a 'reasonable place of feeling decent.' However, also aware that he is not to be taking previously prescribed MS Contin with Fentanyl patch in place as this medication has been discontinued. We discussed both medications are similar and I re-emphasized for safety this will not be prescribed together.   In addition to the generalized ache, the patient has been experiencing internal cold sensations and tremors, which they attribute to their Parkinson's disease. These symptoms have been particularly distressing, with the patient  describing feeling 'cold inside my body' and needing to take hot showers to alleviate the discomfort. These symptoms have been ongoing for the past two weeks and are not relieved by the fentanyl patch. His leg pain is controlled with Nervive cream. Self discontinued gabapentin as he felt no relief or difference despite husband feeling differently.   The patient also reports a history of alcohol withdrawal many years ago and notes that their current symptoms are reminiscent of that experience. However, they deny current excessive alcohol consumption.  I had an open and honest discussion with Frederick Ponce regarding his symptom management and ways I would manage his pain which currently leg pain  is controlled.   Despite these challenges, the patient is actively seeking solutions to manage his symptoms and improve his quality of life. Frederick Ponce express a willingness to adjust his pain management regimen, including potentially increasing the dosage of the fentanyl patch or trying a different medication.  Education provided on use of breakthrough medication which he has not been using versus increasing Fentanyl patch. This is with concern that his reports are related to a rigors versus pain but with generalized aches from muscle tension. After extensive discussion he will trial MS IR for breakthrough pain.   All questions answered and support provided.    Assessment & Plan  Chronic Pain Generalized body aches described as flu-like symptoms, not relieved by current Fentanyl patch. Previous trial of Morphine ER provided relief. Patient reports feeling cold internally, which is not typically associated with pain. Offered to discontinue Fentanyl patch and restart morphine. Declined.  -Continue Fentanyl patch. -Prescribe immediate release Morphine 15mg  every 8 hours as needed for pain. -Advise patient to stay at home, no driving, operating heavy machinery after  due to potential drowsiness. -Plan to follow up with patient next Thursday to assess response to new medication.  Parkinson's Disease Patient reports tremors, which are is not reported to be attributed to Parkinson's disease. No changes in management discussed. -Continue current management per Neurology  General Health Maintenance / Followup Plans -Follow up call next Thursday to assess response to new pain management regimen. Patient knows to contact office sooner if needed.   Patient expressed understanding and was in agreement with this plan. He also understands that He can call the clinic at any time with any questions, concerns, or complaints.    Any controlled substances utilized were prescribed in the context of palliative care.  PDMP has been reviewed.    Visit consisted of counseling and education dealing with the complex and emotionally intense issues of symptom management and palliative care in the setting of serious and potentially life-threatening illness.  Frederick Ponce, AGPCNP-BC  Palliative Medicine Team/[facility]  *Please note that this is a verbal dictation therefore any spelling or grammatical errors are due to the "Dragon [employer] One" system interpretation.

## 2023-10-17 ENCOUNTER — Inpatient Hospital Stay (HOSPITAL_BASED_OUTPATIENT_CLINIC_OR_DEPARTMENT_OTHER): Payer: Medicare Other | Admitting: Nurse Practitioner

## 2023-10-17 ENCOUNTER — Encounter: Payer: Self-pay | Admitting: Nurse Practitioner

## 2023-10-17 DIAGNOSIS — G893 Neoplasm related pain (acute) (chronic): Secondary | ICD-10-CM

## 2023-10-17 DIAGNOSIS — C7951 Secondary malignant neoplasm of bone: Secondary | ICD-10-CM

## 2023-10-17 DIAGNOSIS — Z515 Encounter for palliative care: Secondary | ICD-10-CM

## 2023-10-17 DIAGNOSIS — C61 Malignant neoplasm of prostate: Secondary | ICD-10-CM | POA: Diagnosis not present

## 2023-10-17 NOTE — Progress Notes (Signed)
Histology and Location of Primary Skin Cancer: Basal Cell Carcinoma  Additional Dx:  Malignant neoplasm of the prostate Secondary malignant neoplasm of bone  Biopsy 07/09/2023   Past/Anticipated interventions by patient's surgeon/dermatologist for current problematic lesion, if any:  Referred to radiation for possible treatment.  Past skin cancers, if any: 1) Location/Histology/Intervention: Face/excision/medicated cream  History of Blistering sunburns, if any:  Yes, nothing lately per patient.  SAFETY ISSUES: Prior radiation? Yes, Prostate cancer with mets to left pubis/acetabular (08/24/2022) & L5, (06/11/2022-06/22/2022), left iliac and sacral bones (01/31/2022-02/13/2022) treated by Dr. Margaretmary Dys. Pacemaker/ICD? No Possible current pregnancy? Male Is the patient on methotrexate? No  Current Complaints / other details:   Pain all over like body pain 4/10.

## 2023-10-21 ENCOUNTER — Ambulatory Visit
Admission: RE | Admit: 2023-10-21 | Discharge: 2023-10-21 | Disposition: A | Discharge status: Home / Self Care | Payer: Medicare Other | Source: Ambulatory Visit | Attending: Radiation Oncology

## 2023-10-21 ENCOUNTER — Encounter: Payer: Self-pay | Admitting: Radiation Oncology

## 2023-10-21 ENCOUNTER — Ambulatory Visit
Admission: RE | Admit: 2023-10-21 | Discharge: 2023-10-21 | Disposition: A | Discharge status: Home / Self Care | Payer: Medicare Other | Source: Ambulatory Visit | Attending: Radiation Oncology | Admitting: Radiation Oncology

## 2023-10-21 ENCOUNTER — Other Ambulatory Visit: Payer: Self-pay | Admitting: *Deleted

## 2023-10-21 VITALS — BP 132/65 | HR 71 | Temp 97.7°F | Resp 20 | Ht 68.0 in | Wt 176.8 lb

## 2023-10-21 DIAGNOSIS — M129 Arthropathy, unspecified: Secondary | ICD-10-CM | POA: Insufficient documentation

## 2023-10-21 DIAGNOSIS — C44319 Basal cell carcinoma of skin of other parts of face: Secondary | ICD-10-CM

## 2023-10-21 DIAGNOSIS — I10 Essential (primary) hypertension: Secondary | ICD-10-CM | POA: Diagnosis not present

## 2023-10-21 DIAGNOSIS — Z808 Family history of malignant neoplasm of other organs or systems: Secondary | ICD-10-CM | POA: Insufficient documentation

## 2023-10-21 DIAGNOSIS — K219 Gastro-esophageal reflux disease without esophagitis: Secondary | ICD-10-CM | POA: Insufficient documentation

## 2023-10-21 DIAGNOSIS — Z79899 Other long term (current) drug therapy: Secondary | ICD-10-CM | POA: Insufficient documentation

## 2023-10-21 DIAGNOSIS — C4481 Basal cell carcinoma of overlapping sites of skin: Secondary | ICD-10-CM | POA: Insufficient documentation

## 2023-10-21 DIAGNOSIS — G20A1 Parkinson's disease without dyskinesia, without mention of fluctuations: Secondary | ICD-10-CM | POA: Diagnosis not present

## 2023-10-21 DIAGNOSIS — Z801 Family history of malignant neoplasm of trachea, bronchus and lung: Secondary | ICD-10-CM | POA: Diagnosis not present

## 2023-10-21 DIAGNOSIS — C44619 Basal cell carcinoma of skin of left upper limb, including shoulder: Secondary | ICD-10-CM

## 2023-10-21 DIAGNOSIS — E785 Hyperlipidemia, unspecified: Secondary | ICD-10-CM | POA: Diagnosis not present

## 2023-10-21 DIAGNOSIS — C7951 Secondary malignant neoplasm of bone: Secondary | ICD-10-CM

## 2023-10-21 MED ORDER — RUCAPARIB CAMSYLATE 300 MG PO TABS: 300.0000 mg | ORAL_TABLET | Freq: Two times a day (BID) | ORAL | 1 refills | Status: DC

## 2023-10-21 NOTE — Progress Notes (Unsigned)
[facility] OFFICE PROGRESS NOTE  Arnette Felts, FNP [address] [place] [address]  DIAGNOSIS: ***  PRIOR THERAPY:  CURRENT THERAPY:  INTERVAL HISTORY: Frederick Ponce 77 y.o. male returns for *** regular *** visit for followup of ***   MEDICAL HISTORY: Past Medical History:  Diagnosis Date   Arthritis    Depression    Family history of melanoma    Family history of prostate cancer    GERD (gastroesophageal reflux disease)    Hyperlipidemia    Hypertension    Parkinson disease (HCC)    Prostate cancer (HCC)     ALLERGIES:  is allergic to gabapentin and celebrex [celecoxib].  MEDICATIONS:  Current Outpatient Medications  Medication Sig Dispense Refill   acetaminophen (TYLENOL) 500 MG tablet Take 1,000 mg by mouth every 6 (six) hours as needed.     amLODipine (NORVASC) 5 MG tablet Take 1 tablet (5 mg total) by mouth daily. 90 tablet 1   atorvastatin (LIPITOR) 40 MG tablet TAKE ONE TABLET BY MOUTH EVERY MORNING 90 tablet 1   calcium-vitamin D (OSCAL WITH D) 500-5 MG-MCG tablet Take 1 tablet by mouth 2 (two) times daily. 60 tablet 3   carbidopa-levodopa (SINEMET) 25-100 MG tablet Take 1 tablet by mouth 4 (four) times daily. 360 tablet 3   COENZYME Q10 PO Take 1 capsule by mouth every morning.     fentaNYL (DURAGESIC) 25 MCG/HR Place 1 patch onto the skin every 3 (three) days. 10 patch 0   ibuprofen (ADVIL) 200 MG tablet Take 200 mg by mouth every 6 (six) hours as needed.     KRILL OIL PO Take 1 capsule by mouth every morning.     morphine (MSIR) 15 MG tablet Take 1 tablet (15 mg total) by mouth every 8 (eight) hours. 21 tablet 0   multivitamin-lutein (OCUVITE-LUTEIN) CAPS capsule Take 1 capsule by mouth every morning.     rucaparib camsylate (RUBRACA) 300 MG tablet Take 1 tablet (300 mg total) by mouth 2 (two) times daily. 60 tablet 1   vitamin B-12 (CYANOCOBALAMIN) 1000 MCG tablet Take 1,000 mcg by mouth every morning.     No current  facility-administered medications for this visit.    SURGICAL HISTORY: No past surgical history on file.  REVIEW OF SYSTEMS:   Review of Systems  Constitutional: Negative for appetite change, chills, fatigue, fever and unexpected weight change.  HENT:   Negative for mouth sores, nosebleeds, sore throat and trouble swallowing.   Eyes: Negative for eye problems and icterus.  Respiratory: Negative for cough, hemoptysis, shortness of breath and wheezing.   Cardiovascular: Negative for chest pain and leg swelling.  Gastrointestinal: Negative for abdominal pain, constipation, diarrhea, nausea and vomiting.  Genitourinary: Negative for bladder incontinence, difficulty urinating, dysuria, frequency and hematuria.   Musculoskeletal: Negative for back pain, gait problem, neck pain and neck stiffness.  Skin: Negative for itching and rash.  Neurological: Negative for dizziness, extremity weakness, gait problem, headaches, light-headedness and seizures.  Hematological: Negative for adenopathy. Does not bruise/bleed easily.  Psychiatric/Behavioral: Negative for confusion, depression and sleep disturbance. The patient is not nervous/anxious.     PHYSICAL EXAMINATION:  There were no vitals taken for this visit.  ECOG PERFORMANCE STATUS: {CHL ONC ECOG [id number]  Physical Exam  Constitutional: Oriented to person, place, and time and well-developed, well-nourished, and in no distress. No distress.  HENT:  Head: Normocephalic and atraumatic.  Mouth/Throat: Oropharynx is clear and moist. No oropharyngeal exudate.  Eyes:  Conjunctivae are normal. Right eye exhibits no discharge. Left eye exhibits no discharge. No scleral icterus.  Neck: Normal range of motion. Neck supple.  Cardiovascular: Normal rate, regular rhythm, normal heart sounds and intact distal pulses.   Pulmonary/Chest: Effort normal and breath sounds normal. No respiratory distress. No wheezes. No rales.  Abdominal: Soft. Bowel sounds  are normal. Exhibits no distension and no mass. There is no tenderness.  Musculoskeletal: Normal range of motion. Exhibits no edema.  Lymphadenopathy:    No cervical adenopathy.  Neurological: Alert and oriented to person, place, and time. Exhibits normal muscle tone. Gait normal. Coordination normal.  Skin: Skin is warm and dry. No rash noted. Not diaphoretic. No erythema. No pallor.  Psychiatric: Mood, memory and judgment normal.  Vitals reviewed.  LABORATORY DATA: Lab Results  Component Value Date   WBC 3.4 (L) 09/24/2023   HGB 10.0 (L) 09/24/2023   HCT 30.4 (L) 09/24/2023   MCV 110.1 (H) 09/24/2023   PLT 181 09/24/2023      Chemistry      Component Value Date/Time   NA 139 09/24/2023 1519   NA 140 09/14/2021 0920   K 4.4 09/24/2023 1519   CL 107 09/24/2023 1519   CO2 28 09/24/2023 1519   BUN 21 09/24/2023 1519   BUN 18 09/14/2021 0920   CREATININE 1.23 09/24/2023 1519      Component Value Date/Time   CALCIUM 9.6 09/24/2023 1519   ALKPHOS 57 09/24/2023 1519   AST 20 09/24/2023 1519   ALT 10 09/24/2023 1519   BILITOT 1.0 09/24/2023 1519       RADIOGRAPHIC STUDIES:  No results found.   ASSESSMENT/PLAN:  No problem-specific Assessment & Plan notes found for this encounter.   No orders of the defined types were placed in this encounter.    I spent {CHL ONC TIME VISIT - XBMWU:[id number]} counseling the patient face to face. The total time spent in the appointment was {CHL ONC TIME VISIT - OZDGU:[id number]}.  Maela Takeda L Alyssandra Hulsebus, PA-C 10/21/23

## 2023-10-21 NOTE — Progress Notes (Signed)
Palliative Medicine [facility]  [phone] [phone]   Name: Frederick Ponce Date: 10/21/2023 MRN: [id number]  DOB: 1946/11/16  Patient Care Team: Arnette Felts, FNP as PCP - General (General Practice) Pickenpack-Cousar, Arty Baumgartner, NP as Nurse Practitioner (Nurse Practitioner) Alisa Graff, Pascal Lux, LCSW as Social Worker (Licensed Clinical Social Worker) Margaretmary Dys, MD as Consulting Physician (Radiation Oncology)   REASON FOR CONSULTATION: Frederick Ponce is a 77 y.o. male with medical history including Parkinson's disease and hyperlipidemia. 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 He is having urgent radiation appointment due to iliac bone pain. Palliative ask to see for symptom management.   SOCIAL HISTORY:     reports that he has never smoked. He has never used smokeless tobacco. He reports current alcohol use of about 15.0 standard drinks of alcohol per week. He reports that he does not use drugs.  ADVANCE DIRECTIVES:  Patient is scheduled to complete advanced directives at our clinic here at the cancer center on 02/16/2022. MOST form provided.  Patient plans to review and complete at a later time.  CODE STATUS:   PAST MEDICAL HISTORY: Past Medical History:  Diagnosis Date   Arthritis    Depression    Family history of melanoma    Family history of prostate cancer    GERD (gastroesophageal reflux disease)    Hyperlipidemia    Hypertension    Parkinson disease (HCC)    Prostate cancer (HCC)     PAST SURGICAL HISTORY: No past surgical history on file.  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  The report date is December 21, 2022.  The CancerNext-Expanded gene panel offered by [facility] and includes sequencing and rearrangement analysis for the following 77 genes: AIP, ALK, APC*, ATM*, AXIN2, BAP1, BARD1, BLM, BMPR1A, BRCA1*, BRCA2*, BRIP1*, CDC73, CDH1*, CDK4, CDKN1B, CDKN2A, CHEK2*, CTNNA1, DICER1, FANCC, FH, FLCN, GALNT12, KIF1B, LZTR1, MAX, MEN1, MET, MLH1*, MSH2*, MSH3, MSH6*, MUTYH*, NBN, NF1*, NF2, NTHL1, PALB2*, PHOX2B, PMS2*, POT1, PRKAR1A, PTCH1, PTEN*, RAD51C*, RAD51D*, RB1, RECQL, RET, SDHA, SDHAF2, SDHB, SDHC, SDHD, SMAD4, SMARCA4, SMARCB1, SMARCE1, STK11, SUFU, TMEM127, TP53*, TSC1, TSC2, VHL and XRCC2 (sequencing and deletion/duplication); EGFR, EGLN1, HOXB13, KIT, MITF, PDGFRA, POLD1, and POLE (sequencing only); EPCAM and GREM1 (deletion/duplication only). DNA and RNA analyses performed for * genes.      ALLERGIES:  is allergic to gabapentin and celebrex [celecoxib].  MEDICATIONS:  Current Outpatient Medications  Medication Sig Dispense Refill   acetaminophen (TYLENOL) 500 MG tablet Take 1,000 mg by mouth every 6 (six) hours as needed.     amLODipine (NORVASC) 5 MG tablet Take 1 tablet (5 mg total) by mouth daily. 90 tablet 1   atorvastatin (LIPITOR) 40 MG tablet TAKE ONE TABLET BY MOUTH EVERY MORNING 90 tablet 1   calcium-vitamin D (OSCAL WITH D) 500-5 MG-MCG tablet Take 1 tablet by mouth 2 (two) times daily. 60 tablet 3   carbidopa-levodopa (  SINEMET) 25-100 MG tablet Take 1 tablet by mouth 4 (four) times daily. 360 tablet 3   COENZYME Q10 PO Take 1 capsule by mouth every morning.     fentaNYL (DURAGESIC) 25 MCG/HR Place 1 patch onto the skin every 3 (three) days. 10 patch 0   ibuprofen (ADVIL) 200  MG tablet Take 200 mg by mouth every 6 (six) hours as needed.     KRILL OIL PO Take 1 capsule by mouth every morning.     morphine (MSIR) 15 MG tablet Take 1 tablet (15 mg total) by mouth every 8 (eight) hours. 21 tablet 0   multivitamin-lutein (OCUVITE-LUTEIN) CAPS capsule Take 1 capsule by mouth every morning.     rucaparib camsylate (RUBRACA) 300 MG tablet Take 1 tablet (300 mg total) by mouth 2 (two) times daily. 60 tablet 1   vitamin B-12 (CYANOCOBALAMIN) 1000 MCG tablet Take 1,000 mcg by mouth every morning.     No current facility-administered medications for this visit.    PERFORMANCE STATUS (ECOG) : 1 - Symptomatic but completely ambulatory  Discussed the use of AI scribe software for clinical note transcription with the patient, who gave verbal consent to proceed.   IMPRESSION:  Frederick Ponce presents to clinic for symptom management follow-up. No acute distress. His husband is present. Frederick Ponce has a history of chronic pain and Parkinson's disease, presents with ongoing pain management issues. Denies nausea, vomiting, or diarrhea. Is taking things one day at a time.   We discussed at length his pain concerns. Frederick Ponce reports that the morphine immediate release provided temporary relief but did not significantly differ from previous treatments. The patient expresses a desire to increase his fentanyl patch dosage to 50 mcg, believing that the current dosage is not providing adequate relief.  He also expresses frustration with the ongoing changes to his medication regimen. I acknowledged his concerns as we discussed trial and error. I spoke to the different medications tried specifically gabapentin most recently and lack of effectiveness per patient and side effects as doses of medications were increased. Education provided on increasing fentanyl patch slowly despite request of increasing to 50 mcg with concern of intolerance. He verbalized understanding. We will increase patch to 37 mcg and  closely monitor if ineffective over time will consider increasing to as needed. Frederick Ponce is clear in his expressed primary goal which is to improve his quality of life by managing his pain more effectively. He expresses a desire to be able to take walks and go to the grocery store independently. He is eager to find a stable, effective treatment plan that allows him to live his life more fully. He understands we will strive to make every effort to obtain this goal while adjusting medications to assist to achieve this. Education provided that once his goals are obtained or starting to appear in progress this becomes a clear measurable sign that medications and plans are moving in the right direction.   Frederick Ponce has been experiencing significant bowel issues, including constipation and episodes of prolonged bowel movements, which he describes as extremely painful and requiring him to lie down in the floor. He reports that these episodes have been occurring weekly for the past three to four weeks. Previously, when taking morphine three times a week, he managed his bowel movements with daily stool softeners, which he found effective. Denies concerns for constipation or concerns for lack of emptying. States he has an upcoming appointment with his Gastroenterologist for further evaluation of this  concern. I advised to consider increasing daily stool softeners to ensure complete emptying and softening of bowels.   In addition to his pain and bowel issues, Frederick Ponce continues to report episodes of extreme cold following his afternoon nap. These episodes last for several hours and require the patient to take measures such as wearing sweaters, even when sitting in the sun, and taking hot showers to regain warmth with a heater in the bathroom. He has consulted with his neurologist, who does not believe these episodes are related to his Parkinson's disease. Unfortunately, I am uncertain what could be causing these episodes  as I do not think they are related to his pain or pain medications.  Assessment & Plan  Chronic Pain Patient reports inadequate pain control with current regimen of Fentanyl patch. Patient desires to increase dose to improve quality of life and ability to perform daily activities. Discussed risks and benefits of increasing dose. -Increase Fentanyl patch to 37.18mcg ( + ) for better pain control. -Check patient's response to increased dose.  Constipation Patient reports significant constipation and bowel irregularities, possibly related to opioid use. Patient has a history of managing constipation with stool softeners when on morphine. -Continue current bowel regimen and monitor. -Consider increasing stool softeners daily if constipation persists. -Patient reports he has an upcoming appointment with the Gastroenterologist.  Cold Intolerance Patient reports episodes of extreme cold intolerance, particularly after napping. Cause currently unknown. -Continue to monitor symptoms. -Consider further evaluation if symptoms persist or worsen. Uncertain of etiology.  Follow-up -Check with pharmacy regarding prior authorization for increased Fentanyl dose. -Continue to monitor patient's response to increased Fentanyl dose and bowel regimen. -Consider further evaluation for cold intolerance if symptoms persist or worsen. -Will plan follow-up in 1-2 weeks Patient expressed understanding and was in agreement with this plan. He also understands that He can call the clinic at any time with any questions, concerns, or complaints.    Any controlled substances utilized were prescribed in the context of palliative care. PDMP has been reviewed.    Visit consisted of counseling and education dealing with the complex and emotionally intense issues of symptom management and palliative care in the setting of serious and potentially life-threatening illness.  Willette Alma, AGPCNP-BC   Palliative Medicine Team/[facility]  *Please note that this is a verbal dictation therefore any spelling or grammatical errors are due to the "Dragon Medical One" system interpretation.

## 2023-10-21 NOTE — Progress Notes (Signed)
Radiation Oncology         [phone] ________________________________  Outpatient Consultation  Name: Frederick Ponce MRN: [id number]  Date of Service: 10/21/2023 DOB: 25-May-1946  CC:Arnette Felts, FNP  Haverstock, Elvin So*   REFERRING PHYSICIAN: Haverstock, Christina L*  DIAGNOSIS: The encounter diagnosis was Basal cell carcinoma of overlapping sites of skin.    ICD-10-CM   1. Basal cell carcinoma of overlapping sites of skin  C44.81       HISTORY OF PRESENT ILLNESS: Frederick Ponce is a 77 y.o. male seen at the request of Dr. Sharyn Lull. He is well known to [place] for his history of metastatic prostate cancer; he continues on systemic treatment with ADT and rucaparib under Dr. Leonides Schanz. He has been followed by Dr. Sharyn Lull in dermatology for a history of numerous skin lesions, with history of lentigo and HSV. He has had removal of several lesions showing basal cell carcinoma-- right central forehead, right temple, right lateral cheek, left posterior shoulder, and most recently left medial lower leg.   Patient states he was given a cream to apply to the skin lesions on his face. He denies any issues with the removed skin lesions.   PREVIOUS RADIATION THERAPY: Yes  09/05/22 - 09/18/22: Left pubis/acetabula / 30 Gy in 10 fractions  06/11/22 - 06/22/22: The painful site of metastasis at L5 was treated to 30 Gy in 10 fractions of 3 Gy each   PAST MEDICAL HISTORY:  Past Medical History:  Diagnosis Date   Arthritis    Depression    Family history of melanoma    Family history of prostate cancer    GERD (gastroesophageal reflux disease)    Hyperlipidemia    Hypertension    Parkinson disease (HCC)    Prostate cancer (HCC)       PAST SURGICAL HISTORY:No past surgical history on file.  FAMILY HISTORY:  Family History  Problem Relation Age of Onset   Kidney disease Mother    Colon cancer Mother    Heart disease Father    Tremor Father    Parkinson's disease Father     Prostate cancer Father    Melanoma Sister    Parkinson's disease Sister    Melanoma Sister        melanoma x2   Prostate cancer Brother    Parkinson's disease Paternal Grandmother    Lung cancer Paternal Grandfather    Parkinson's disease Paternal Aunt    Lung cancer Paternal Aunt     SOCIAL HISTORY:  Social History   Socioeconomic History   Marital status: Married    Spouse name: Janae Sauce   Number of children: 0   Years of education: Not on file   Highest education level: Associate degree: academic program  Occupational History   Not on file  Tobacco Use   Smoking status: Never   Smokeless tobacco: Never  Vaping Use   Vaping status: Never Used  Substance and Sexual Activity   Alcohol use: Yes    Alcohol/week: 15.0 standard drinks of alcohol    Types: 15 Glasses of wine per week    Comment: couple glasses a night   Drug use: Never   Sexual activity: Not Currently    Birth control/protection: None  Other Topics Concern   Not on file  Social History Narrative   Not on file   Social Determinants of Health   Financial Resource Strain: Low Risk  (08/09/2023)   Overall Financial Resource Strain (CARDIA)  Difficulty of Paying Living Expenses: Not hard at all  Food Insecurity: No Food Insecurity (10/21/2023)   Hunger Vital Sign    Worried About Running Out of Food in the Last Year: Never true    Ran Out of Food in the Last Year: Never true  Transportation Needs: No Transportation Needs (10/21/2023)   PRAPARE - Administrator, Civil Service (Medical): No    Lack of Transportation (Non-Medical): No  Physical Activity: Inactive (08/09/2023)   Exercise Vital Sign    Days of Exercise per Week: 0 days    Minutes of Exercise per Session: 0 min  Stress: No Stress Concern Present (08/09/2023)   [employer] of Occupational Health - Occupational Stress Questionnaire    Feeling of Stress : Only a little  Social Connections: Moderately Isolated (08/09/2023)    Social Connection and Isolation Panel [NHANES]    Frequency of Communication with Friends and Family: More than three times a week    Frequency of Social Gatherings with Friends and Family: Once a week    Attends Religious Services: Never    Database administrator or Organizations: No    Attends Engineer, structural: Not on file    Marital Status: Married  Catering manager Violence: Not At Risk (10/21/2023)   Humiliation, Afraid, Rape, and Kick questionnaire    Fear of Current or Ex-Partner: No    Emotionally Abused: No    Physically Abused: No    Sexually Abused: No    ALLERGIES: Gabapentin and Celebrex [celecoxib]  MEDICATIONS:  Current Outpatient Medications  Medication Sig Dispense Refill   acetaminophen (TYLENOL) 500 MG tablet Take 1,000 mg by mouth every 6 (six) hours as needed.     amLODipine (NORVASC) 5 MG tablet Take 1 tablet (5 mg total) by mouth daily. 90 tablet 1   atorvastatin (LIPITOR) 40 MG tablet TAKE ONE TABLET BY MOUTH EVERY MORNING 90 tablet 1   calcium-vitamin D (OSCAL WITH D) 500-5 MG-MCG tablet Take 1 tablet by mouth 2 (two) times daily. 60 tablet 3   carbidopa-levodopa (SINEMET) 25-100 MG tablet Take 1 tablet by mouth 4 (four) times daily. 360 tablet 3   COENZYME Q10 PO Take 1 capsule by mouth every morning.     fentaNYL (DURAGESIC) 25 MCG/HR Place 1 patch onto the skin every 3 (three) days. 10 patch 0   ibuprofen (ADVIL) 200 MG tablet Take 200 mg by mouth every 6 (six) hours as needed.     KRILL OIL PO Take 1 capsule by mouth every morning.     morphine (MSIR) 15 MG tablet Take 1 tablet (15 mg total) by mouth every 8 (eight) hours. 21 tablet 0   multivitamin-lutein (OCUVITE-LUTEIN) CAPS capsule Take 1 capsule by mouth every morning.     rucaparib camsylate (RUBRACA) 300 MG tablet Take 1 tablet (300 mg total) by mouth 2 (two) times daily. 60 tablet 1   vitamin B-12 (CYANOCOBALAMIN) 1000 MCG tablet Take 1,000 mcg by mouth every morning.     No  current facility-administered medications for this encounter.    REVIEW OF SYSTEMS:  On review of systems, the patient reports that he is doing well overall. He denies any chest pain, shortness of breath, cough, fevers, chills, night sweats, unintended weight changes. He denies any bowel or bladder disturbances, and denies abdominal pain, nausea or vomiting. He has 4/10 pain "all over" but none specific to his sites of skin disease.  A complete review of systems is obtained  and is otherwise negative.    PHYSICAL EXAM:  Wt Readings from Last 3 Encounters:  10/21/23 176 lb 12.8 oz (80.2 kg)  09/26/23 177 lb 12.8 oz (80.6 kg)  09/16/23 177 lb 3 oz (80.4 kg)   Temp Readings from Last 3 Encounters:  10/21/23 97.7 F (36.5 C)  09/26/23 98.6 F (37 C) (Oral)  09/16/23 98.1 F (36.7 C) (Temporal)   BP Readings from Last 3 Encounters:  10/21/23 132/65  09/26/23 136/72  09/16/23 128/60   Pulse Readings from Last 3 Encounters:  10/21/23 71  09/26/23 88  09/16/23 87   Pain Assessment Pain Score: 4  Pain Loc: Generalized/10  In general this is a well appearing man in no acute distress. He's alert and oriented x4 and appropriate throughout the examination. Cardiopulmonary assessment is negative for acute distress and he exhibits normal effort.   The skin on the face has healed well in areas of past biopsies. There is a flat, well-demarcated, skin lesion approximately 0.5 cm in greatest dimension at the medial left lower extremity, which is lighter in color compared to the rest of the skin. No obvious signs of persistent disease.     KPS = 100  100 - Normal; no complaints; no evidence of disease. 90   - Able to carry on normal activity; minor signs or symptoms of disease. 80   - Normal activity with effort; some signs or symptoms of disease. 13   - Cares for self; unable to carry on normal activity or to do active work. 60   - Requires occasional assistance, but is able to care for  most of his personal needs. 50   - Requires considerable assistance and frequent medical care. 40   - Disabled; requires special care and assistance. 30   - Severely disabled; hospital admission is indicated although death not imminent. 20   - Very sick; hospital admission necessary; active supportive treatment necessary. 10   - Moribund; fatal processes progressing rapidly. 0     - Dead  Karnofsky DA, Abelmann WH, Craver LS and Burchenal [facility] [phone]) The use of the nitrogen mustards in the palliative treatment of carcinoma: with particular reference to bronchogenic carcinoma Cancer 1 634-56  LABORATORY DATA:  Lab Results  Component Value Date   WBC 3.4 (L) 09/24/2023   HGB 10.0 (L) 09/24/2023   HCT 30.4 (L) 09/24/2023   MCV 110.1 (H) 09/24/2023   PLT 181 09/24/2023   Lab Results  Component Value Date   NA 139 09/24/2023   K 4.4 09/24/2023   CL 107 09/24/2023   CO2 28 09/24/2023   Lab Results  Component Value Date   ALT 10 09/24/2023   AST 20 09/24/2023   ALKPHOS 57 09/24/2023   BILITOT 1.0 09/24/2023     RADIOGRAPHY: No results found.    IMPRESSION/PLAN: 1. 77 y.o. man with basal cell carcinoma; s/p shave biopsies  Today, we talked to the patient and family about the findings and workup thus far. We discussed the natural history of basal cell carcinoma and general treatment, highlighting the role of radiotherapy in the management. We discussed the available radiation techniques, and focused on the details and logistics of delivery. We reviewed the anticipated acute and late sequelae associated with radiation in this setting. The patient was encouraged to ask questions that were answered to his satisfaction.  At the end of our conversation, the patient would like to avoid radiation treatment. He is already going through a lot with his  current prostate treatment and, understandably, would prefer to avoid adding any additional treatment at this time. Patient was educated on signs  of disease recurrence and knows to be seen if he develops any of these. Recommend continued follow-up with his dermatologist. Radiation follow-up PRN.    I personally spent 50 minutes in this encounter including chart review, reviewing radiological studies, meeting face-to-face with the patient, entering orders and completing documentation.     Joyice Faster, PA-C    Margaretmary Dys, MD  [facility]  Radiation Oncology Direct Dial: [phone]  Fax: [phone] .com  Skype  LinkedIn   This document serves as a record of services personally performed by Margaretmary Dys, MD and Joyice Faster, PA-C. It was created on their behalf by Mickie Bail, a trained medical scribe. The creation of this record is based on the scribe's personal observations and the provider's statements to them. This document has been checked and approved by the attending provider.

## 2023-10-21 NOTE — Progress Notes (Signed)
Photos from referring dermatology office:   Left Pre-Auricular   Right Leg   Right Temple   Left Medial Lower Leg   Left Anterior Mid Shin   Central Upper Chest

## 2023-10-22 ENCOUNTER — Inpatient Hospital Stay: Payer: Medicare Other

## 2023-10-22 DIAGNOSIS — C61 Malignant neoplasm of prostate: Secondary | ICD-10-CM

## 2023-10-22 DIAGNOSIS — C7951 Secondary malignant neoplasm of bone: Secondary | ICD-10-CM | POA: Diagnosis not present

## 2023-10-22 LAB — CBC WITH DIFFERENTIAL (CANCER CENTER ONLY)
Abs Immature Granulocytes: 0.01 10*3/uL (ref 0.00–0.07)
Basophils Absolute: 0 10*3/uL (ref 0.0–0.1)
Basophils Relative: 1 %
Eosinophils Absolute: 0.1 10*3/uL (ref 0.0–0.5)
Eosinophils Relative: 2 %
HCT: 32.1 % — ABNORMAL LOW (ref 39.0–52.0)
Hemoglobin: 10.7 g/dL — ABNORMAL LOW (ref 13.0–17.0)
Immature Granulocytes: 0 %
Lymphocytes Relative: 31 %
Lymphs Abs: 1.1 10*3/uL (ref 0.7–4.0)
MCH: 36 pg — ABNORMAL HIGH (ref 26.0–34.0)
MCHC: 33.3 g/dL (ref 30.0–36.0)
MCV: 108.1 fL — ABNORMAL HIGH (ref 80.0–100.0)
Monocytes Absolute: 0.3 10*3/uL (ref 0.1–1.0)
Monocytes Relative: 9 %
Neutro Abs: 2.1 10*3/uL (ref 1.7–7.7)
Neutrophils Relative %: 57 %
Platelet Count: 158 10*3/uL (ref 150–400)
RBC: 2.97 MIL/uL — ABNORMAL LOW (ref 4.22–5.81)
RDW: 13 % (ref 11.5–15.5)
WBC Count: 3.7 10*3/uL — ABNORMAL LOW (ref 4.0–10.5)
nRBC: 0 % (ref 0.0–0.2)

## 2023-10-22 LAB — CMP (CANCER CENTER ONLY)
ALT: 5 U/L (ref 0–44)
AST: 18 U/L (ref 15–41)
Albumin: 4.5 g/dL (ref 3.5–5.0)
Alkaline Phosphatase: 55 U/L (ref 38–126)
Anion gap: 6 (ref 5–15)
BUN: 21 mg/dL (ref 8–23)
CO2: 25 mmol/L (ref 22–32)
Calcium: 9.6 mg/dL (ref 8.9–10.3)
Chloride: 107 mmol/L (ref 98–111)
Creatinine: 1.03 mg/dL (ref 0.61–1.24)
GFR, Estimated: >60 mL/min (ref >60)
Glucose, Bld: 116 mg/dL — ABNORMAL HIGH (ref 70–99)
Potassium: 4.4 mmol/L (ref 3.5–5.1)
Sodium: 138 mmol/L (ref 135–145)
Total Bilirubin: 0.9 mg/dL (ref 0.3–1.2)
Total Protein: 7 g/dL (ref 6.5–8.1)

## 2023-10-23 LAB — PROSTATE-SPECIFIC AG, SERUM (LABCORP): Prostate Specific Ag, Serum: 3.6 ng/mL (ref 0.0–4.0)

## 2023-10-24 ENCOUNTER — Inpatient Hospital Stay: Payer: Medicare Other

## 2023-10-24 ENCOUNTER — Inpatient Hospital Stay: Payer: Medicare Other | Admitting: Physician Assistant

## 2023-10-24 ENCOUNTER — Encounter: Payer: Self-pay | Admitting: Nurse Practitioner

## 2023-10-24 ENCOUNTER — Inpatient Hospital Stay (HOSPITAL_BASED_OUTPATIENT_CLINIC_OR_DEPARTMENT_OTHER): Payer: Medicare Other | Admitting: Nurse Practitioner

## 2023-10-24 VITALS — BP 139/71 | HR 77 | Temp 98.1°F | Resp 15 | Wt 175.9 lb

## 2023-10-24 VITALS — BP 150/77 | HR 77 | Temp 98.1°F | Resp 17

## 2023-10-24 DIAGNOSIS — C7951 Secondary malignant neoplasm of bone: Secondary | ICD-10-CM | POA: Diagnosis not present

## 2023-10-24 DIAGNOSIS — Z515 Encounter for palliative care: Secondary | ICD-10-CM

## 2023-10-24 DIAGNOSIS — C61 Malignant neoplasm of prostate: Secondary | ICD-10-CM | POA: Diagnosis not present

## 2023-10-24 DIAGNOSIS — G893 Neoplasm related pain (acute) (chronic): Secondary | ICD-10-CM | POA: Diagnosis not present

## 2023-10-24 DIAGNOSIS — K5903 Drug induced constipation: Secondary | ICD-10-CM

## 2023-10-24 MED ORDER — DENOSUMAB 120 MG/1.7ML ~~LOC~~ SOLN
120.0000 mg | Freq: Once | SUBCUTANEOUS | Status: AC
  Administered 2023-10-24: 120 mg via SUBCUTANEOUS
  Filled 2023-10-24: qty 1.7

## 2023-10-24 MED ORDER — FENTANYL 12 MCG/HR TD PT72: 1.0000 | MEDICATED_PATCH | TRANSDERMAL | 0 refills | Status: DC

## 2023-10-25 ENCOUNTER — Ambulatory Visit: Payer: Medicare Other | Admitting: Physician Assistant

## 2023-10-25 ENCOUNTER — Telehealth: Payer: Self-pay | Admitting: Gastroenterology

## 2023-10-25 NOTE — Telephone Encounter (Signed)
Inbound call from patient requesting a call to discuss bowel habit changes. Please advise, thank you.
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
## 2023-10-25 NOTE — Telephone Encounter (Signed)
Left message for patient to call back  

## 2023-10-27 ENCOUNTER — Other Ambulatory Visit: Payer: Self-pay | Admitting: Nurse Practitioner

## 2023-10-27 DIAGNOSIS — E785 Hyperlipidemia, unspecified: Secondary | ICD-10-CM

## 2023-10-28 ENCOUNTER — Telehealth: Payer: Self-pay | Admitting: Pharmacy Technician

## 2023-10-28 NOTE — Telephone Encounter (Signed)
Oral Oncology Patient Advocate Encounter   Received notification that patient is due for re-enrollment for assistance for Rubraca through [employer].   Re-enrollment process has been initiated and will be submitted upon completion of necessary documents.  [employer] phone number [phone].   I will continue to follow until final determination.  Jinger Neighbors, CPhT-Adv Oncology Pharmacy Patient Advocate [facility] Direct Number: [phone]  Fax: [phone]

## 2023-10-29 ENCOUNTER — Ambulatory Visit (INDEPENDENT_AMBULATORY_CARE_PROVIDER_SITE_OTHER): Payer: Medicare Other | Admitting: Gastroenterology

## 2023-10-29 ENCOUNTER — Encounter: Payer: Self-pay | Admitting: Gastroenterology

## 2023-10-29 ENCOUNTER — Ambulatory Visit
Admission: RE | Admit: 2023-10-29 | Discharge: 2023-10-29 | Disposition: A | Discharge status: Home / Self Care | Payer: Medicare Other | Source: Ambulatory Visit | Attending: Gastroenterology

## 2023-10-29 VITALS — BP 112/60 | HR 82 | Ht 68.0 in | Wt 174.8 lb

## 2023-10-29 DIAGNOSIS — K6289 Other specified diseases of anus and rectum: Secondary | ICD-10-CM

## 2023-10-29 DIAGNOSIS — C7951 Secondary malignant neoplasm of bone: Secondary | ICD-10-CM | POA: Diagnosis not present

## 2023-10-29 DIAGNOSIS — C61 Malignant neoplasm of prostate: Secondary | ICD-10-CM | POA: Diagnosis not present

## 2023-10-29 DIAGNOSIS — R195 Other fecal abnormalities: Secondary | ICD-10-CM | POA: Diagnosis not present

## 2023-10-29 NOTE — Progress Notes (Unsigned)
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  Exam notable for small external skin tag and grade 2 internal hemorrhoids, treated with Anusol and conservative care.  Did have some overlapping intermittent loose stools, and stool studies were negative to include GI PCR panel, calprotectin, pancreatic elastase, and symptoms resolved.  HPI:     Patient is a 77 y.o. male presenting to the Gastroenterology Clinic for evaluation of rectal pain and change in bowel habits.  Was on his morphine and chaned to fentanyl patch, with change from daily BM. 1 month ago BM then multiple BM, pain, lay on floor after. Then warm enema to stop pain. Had 2 more episodes that day.   When morphine was using stool softeners with regular BM. Fentnyl pathc. Morphine prn.   Those same sxs with 1 episode/week since. Last was yesterday. No blood. Stayed stool softener.   Solid BM.   PET scheduled later this week.   Pall Care managing pain control Rx. Follows with Neurology for Parkinsons.    Reviewed most recent labs from 10/22/2023: - WBC 3.7, H/H 10.7/32 with MCV/RDW 108/13, all stable from previous - Normal CMP (BG 116) - PSA 3.6  - 07/18/2023: CT C/A/P: Right liver hemangioma, normal GI tract   Review of systems:     No chest pain, no SOB, no fevers, no urinary sx   Past Medical History:  Diagnosis Date   Arthritis    Depression    Family history of melanoma    Family history of prostate cancer    GERD (gastroesophageal reflux disease)    Hyperlipidemia    Hypertension    Parkinson disease (HCC)    Prostate cancer (HCC)     Patient's surgical history, family medical history, social history, medications and  allergies were all reviewed in Epic    Current Outpatient Medications  Medication Sig Dispense Refill   acetaminophen (TYLENOL) 500 MG tablet Take 1,000 mg by mouth every 6 (six) hours as needed.     amLODipine (NORVASC) 5 MG tablet Take 1 tablet (5 mg total) by mouth daily. 90 tablet 1   atorvastatin (LIPITOR) 40 MG tablet TAKE ONE TABLET BY MOUTH EVERY MORNING 90 tablet 1   calcium-vitamin D (OSCAL WITH D) 500-5 MG-MCG tablet Take 1 tablet by mouth 2 (two) times daily. 60 tablet 3   carbidopa-levodopa (SINEMET) 25-100 MG tablet Take 1 tablet by mouth 4 (four) times daily. 360 tablet 3   COENZYME Q10 PO Take 1 capsule by mouth every morning.     fentaNYL (DURAGESIC) 12 MCG/HR Place 1 patch onto the skin every 3 (three) days. Apply in addition to patch to total . 5 patch 0   fentaNYL (DURAGESIC) 25 MCG/HR Place 1 patch onto the skin every 3 (three) days. 10 patch 0   ibuprofen (ADVIL) 200 MG tablet Take 200 mg by mouth every 6 (six) hours as needed.     KRILL OIL PO Take 1 capsule by mouth every morning.     morphine (MSIR) 15 MG tablet Take 1 tablet (15 mg total) by mouth every 8 (eight) hours. 21 tablet 0   multivitamin-lutein (OCUVITE-LUTEIN) CAPS capsule Take 1 capsule by mouth every morning.     rucaparib camsylate (  RUBRACA) 300 MG tablet Take 1 tablet (300 mg total) by mouth 2 (two) times daily. 60 tablet 1   vitamin B-12 (CYANOCOBALAMIN) 1000 MCG tablet Take 1,000 mcg by mouth every morning.     No current facility-administered medications for this visit.    Physical Exam:     BP 112/60   Pulse 82   GENERAL:  Pleasant *** in NAD PSYCH: : Cooperative, normal affect EENT:  conjunctiva pink, mucous membranes moist, neck supple without masses CARDIAC:  RRR, ***murmur heard, no peripheral edema PULM: Normal respiratory effort, lungs CTA bilaterally, no wheezing ABDOMEN:  Nondistended, soft, nontender. No obvious masses, no hepatomegaly,  normal bowel sounds SKIN:   turgor, no lesions seen Musculoskeletal:  Normal muscle tone, normal strength NEURO: Alert and oriented x 3, no focal neurologic deficits   IMPRESSION and PLAN:    1)   2 view today. If stools, will try methylnaltrexone or increasing stool softener/miralax Med ADR? Pain med, Park med, new Chemo agent Hydration Not fissure sounding (no pain in between episodes)           Shellia Cleverly ,DO, FACG 10/29/2023, 8:32 AM

## 2023-10-29 NOTE — Telephone Encounter (Signed)
Oral Oncology Patient Advocate Encounter  Patient signatures complete. Application currently pending MD signatures.  Jinger Neighbors, CPhT-Adv Oncology Pharmacy Patient Advocate [facility] Direct Number: [phone]  Fax: [phone]

## 2023-10-29 NOTE — Patient Instructions (Addendum)
_______________________________________________________  If your blood pressure at your visit was 140/90 or greater, please contact your primary care physician to follow up on this.  If you are age 78 or younger, your body mass index should be between 19-25. Your Body mass index is 26.58 kg/m. If this is out of the aformentioned range listed, please consider follow up with your Primary Care Provider.  ________________________________________________________  Your provider has requested that you have an abdominal x ray before leaving today. Please go to the basement floor to our Radiology department for the test.  Due to recent changes in healthcare laws, you may see the results of your imaging and laboratory studies on MyChart before your provider has had a chance to review them.  We understand that in some cases there may be results that are confusing or concerning to you. Not all laboratory results come back in the same time frame and the provider may be waiting for multiple results in order to interpret others.  Please give [place] 48 hours in order for your provider to thoroughly review all the results before contacting the office for clarification of your results.   The [facility] providers would like to encourage you to use [facility] to communicate with providers for non-urgent requests or questions.  Due to long hold times on the telephone, sending your provider a message by [facility] may be a faster and more efficient way to get a response.  Please allow 48 business hours for a response.  Please remember that this is for non-urgent requests.  _______________________________________________________  It was a pleasure to see you today!  Vito Cirigliano, D.O.

## 2023-10-31 MED ORDER — ONDANSETRON HCL 4 MG/2ML IJ SOLN
INTRAMUSCULAR | Status: AC
  Filled 2023-10-31: qty 2

## 2023-10-31 MED ORDER — LIDOCAINE HCL (PF) 2 % IJ SOLN
INTRAMUSCULAR | Status: AC
  Filled 2023-10-31: qty 5

## 2023-10-31 MED ORDER — DEXAMETHASONE SODIUM PHOSPHATE 10 MG/ML IJ SOLN
INTRAMUSCULAR | Status: AC
Start: 2023-10-31 — End: ?
  Filled 2023-10-31: qty 1

## 2023-10-31 MED ORDER — FENTANYL CITRATE (PF) 250 MCG/5ML IJ SOLN
INTRAMUSCULAR | Status: AC
  Filled 2023-10-31: qty 5

## 2023-10-31 MED ORDER — ROCURONIUM BROMIDE 10 MG/ML (PF) SYRINGE
PREFILLED_SYRINGE | INTRAVENOUS | Status: AC
  Filled 2023-10-31: qty 10

## 2023-10-31 MED ORDER — LIDOCAINE HCL 2 % IJ SOLN
INTRAMUSCULAR | Status: AC
  Filled 2023-10-31: qty 20

## 2023-10-31 MED ORDER — PROPOFOL 10 MG/ML IV BOLUS
INTRAVENOUS | Status: AC
  Filled 2023-10-31: qty 20

## 2023-11-01 ENCOUNTER — Encounter (HOSPITAL_COMMUNITY): Payer: Medicare Other

## 2023-11-04 ENCOUNTER — Encounter (HOSPITAL_COMMUNITY)
Admission: RE | Admit: 2023-11-04 | Discharge: 2023-11-04 | Disposition: A | Discharge status: Home / Self Care | Payer: Medicare Other | Source: Ambulatory Visit | Attending: Physician Assistant | Admitting: Physician Assistant

## 2023-11-04 DIAGNOSIS — C61 Malignant neoplasm of prostate: Secondary | ICD-10-CM | POA: Diagnosis present

## 2023-11-04 DIAGNOSIS — C7951 Secondary malignant neoplasm of bone: Secondary | ICD-10-CM | POA: Diagnosis present

## 2023-11-04 MED ORDER — FLOTUFOLASTAT F 18 GALLIUM 296-5846 MBQ/ML IV SOLN
7.7800 | Freq: Once | INTRAVENOUS | Status: AC
  Administered 2023-11-04: 7.78 via INTRAVENOUS

## 2023-11-04 NOTE — Progress Notes (Unsigned)
Palliative Medicine [facility]  [phone] [phone]   Name: Frederick Ponce Date: 11/04/2023 MRN: [id number]  DOB: Mar 11, 1946  Patient Care Team: Arnette Felts, FNP as PCP - General (General Practice) Pickenpack-Cousar, Arty Baumgartner, NP as Nurse Practitioner (Nurse Practitioner) Alisa Graff, Pascal Lux, LCSW as Social Worker (Licensed Clinical Social Worker) Margaretmary Dys, MD as Consulting Physician (Radiation Oncology)   I connected with Hulda Marin on 11/04/23 at  2:15 PM EST by phone and verified that I am speaking with the correct person using two identifiers.   I discussed the limitations, risks, security and privacy concerns of performing an evaluation and management service by telemedicine and the availability of in-person appointments. I also discussed with the patient that there may be a patient responsible charge related to this service. The patient expressed understanding and agreed to proceed.   Other persons participating in the visit and their role in the encounter: n/a   Patient's location: home  Provider's location: [facility]   Chief Complaint: f/u of symptom management   REASON FOR CONSULTATION: Frederick Ponce is a 77 y.o. male with medical history including Parkinson's disease and hyperlipidemia. 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 He is having urgent radiation appointment due to iliac bone pain. Palliative ask to see for symptom management.   SOCIAL HISTORY:     reports that he has never smoked. He has never used smokeless tobacco. He reports current alcohol use of about 15.0 standard drinks of alcohol per week. He reports that he does not use drugs.  ADVANCE DIRECTIVES:  Patient is scheduled to complete advanced directives at our clinic here at the cancer center on 02/16/2022. MOST form provided.  Patient  plans to review and complete at a later time.  CODE STATUS:   PAST MEDICAL HISTORY: Past Medical History:  Diagnosis Date   Arthritis    Depression    Family history of melanoma    Family history of prostate cancer    GERD (gastroesophageal reflux disease)    Hyperlipidemia    Hypertension    Parkinson disease (HCC)    Prostate cancer (HCC)     PAST SURGICAL HISTORY: No past surgical history on file.  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  The report date is December 21, 2022.  The CancerNext-Expanded gene panel offered by [facility] and includes sequencing and rearrangement analysis for the following 77 genes: AIP, ALK, APC*, ATM*, AXIN2, BAP1, BARD1, BLM, BMPR1A, BRCA1*, BRCA2*, BRIP1*, CDC73, CDH1*, CDK4, CDKN1B, CDKN2A, CHEK2*, CTNNA1, DICER1, FANCC, FH, FLCN, GALNT12, KIF1B, LZTR1, MAX, MEN1, MET, MLH1*, MSH2*, MSH3, MSH6*, MUTYH*, NBN, NF1*, NF2, NTHL1, PALB2*, PHOX2B, PMS2*, POT1, PRKAR1A, PTCH1, PTEN*, RAD51C*, RAD51D*, RB1, RECQL, RET, SDHA, SDHAF2, SDHB, SDHC, SDHD, SMAD4, SMARCA4, SMARCB1, SMARCE1, STK11, SUFU, TMEM127, TP53*, TSC1, TSC2, VHL and XRCC2 (sequencing and deletion/duplication); EGFR, EGLN1, HOXB13, KIT, MITF, PDGFRA, POLD1, and POLE (sequencing only); EPCAM and GREM1 (deletion/duplication only).  DNA and RNA analyses performed for * genes.      ALLERGIES:  is allergic to gabapentin and celebrex [celecoxib].  MEDICATIONS:  Current Outpatient Medications  Medication Sig  Dispense Refill   acetaminophen (TYLENOL) 500 MG tablet Take 1,000 mg by mouth every 6 (six) hours as needed.     amLODipine (NORVASC) 5 MG tablet Take 1 tablet (5 mg total) by mouth daily. 90 tablet 1   atorvastatin (LIPITOR) 40 MG tablet TAKE 1 TABLET BY MOUTH EVERY MORNING 90 tablet 1   calcium-vitamin D (OSCAL WITH D) 500-5 MG-MCG tablet Take 1 tablet by mouth 2 (two) times daily. 60 tablet 3   carbidopa-levodopa (SINEMET) 25-100 MG tablet Take 1 tablet by mouth 4 (four) times daily. 360 tablet 3   COENZYME Q10 PO Take 1 capsule by mouth every morning.     fentaNYL (DURAGESIC) 12 MCG/HR Place 1 patch onto the skin every 3 (three) days. Apply in addition to patch to total . 5 patch 0   fentaNYL (DURAGESIC) 25 MCG/HR Place 1 patch onto the skin every 3 (three) days. 10 patch 0   ibuprofen (ADVIL) 200 MG tablet Take 200 mg by mouth every 6 (six) hours as needed.     KRILL OIL PO Take 1 capsule by mouth every morning.     morphine (MSIR) 15 MG tablet Take 1 tablet (15 mg total) by mouth every 8 (eight) hours. 21 tablet 0   multivitamin-lutein (OCUVITE-LUTEIN) CAPS capsule Take 1 capsule by mouth every morning.     rucaparib camsylate (RUBRACA) 300 MG tablet Take 1 tablet (300 mg total) by mouth 2 (two) times daily. 60 tablet 1   vitamin B-12 (CYANOCOBALAMIN) 1000 MCG tablet Take 1,000 mcg by mouth every morning.     No current facility-administered medications for this visit.    PERFORMANCE STATUS (ECOG) : 1 - Symptomatic but completely ambulatory  Discussed the use of AI scribe software for clinical note transcription with the patient, who gave verbal consent to proceed.   IMPRESSION: I connected by phone with [name]. No acute distress noted at this time. He denies nausea, vomiting, constipation, or diarrhea. He continues to take things one day at a time. Appetite is good. Some days better than others.   We discussed his pain regimen at length. [name] is tolerating the increase  in his Fentanyl patch to . He has not noticed a significant difference in his pain level. Uses MS IR as needed for breakthrough pain.Does not require around the clock. We discussed close monitoring over the next 3-4 weeks and adjusting medications as needed.   Due to financial constraints of the patches I discussed cost efficiency with Mr. Parola. We will continue to use Fentanyl patches in addition to Fentanyl 12.5 mcg patches as this is at a lower cost compared to $415 for the patches alone. He verbalized understanding and appreciation.   Assessment & Plan  Chronic Pain Patient desires to increase dose to improve quality of life and ability to perform daily activities.Tolerating Fentanyl 37.39mcg patches -Continue with Fentanyl patch to 37.76mcg ( + ) for better pain control.Will prescribe separately due to cost.  -MS IR 15mg  as needed for breakthrough pain  -Continue to closely monitor and adjust for comfort.   Follow-up -Will plan follow-up in 3-4 weeks  Patient expressed understanding and was in agreement with this plan. He also understands that He can call the clinic at any time with any questions, concerns, or complaints.  Any controlled substances utilized were prescribed in the context of palliative care. PDMP has been reviewed.    Visit consisted of counseling and education dealing with the complex and emotionally intense issues of symptom management and palliative care in the setting of serious and potentially life-threatening illness.  Willette Alma, AGPCNP-BC  Palliative Medicine Team/[facility]  *Please note that this is a verbal dictation therefore any spelling or grammatical errors are due to the "Dragon [employer] One" system interpretation.

## 2023-11-04 NOTE — Telephone Encounter (Signed)
Oral Oncology Patient Advocate Encounter   Submitted application for assistance for Rubraca to [employer].   Application submitted via e-fax to [phone]   [employer] phone number [phone].   I will continue to check the status until final determination.   Jinger Neighbors, CPhT-Adv Oncology Pharmacy Patient Advocate [facility] Direct Number: [phone]  Fax: [phone]

## 2023-11-05 ENCOUNTER — Other Ambulatory Visit: Payer: Self-pay | Admitting: Nurse Practitioner

## 2023-11-05 DIAGNOSIS — C61 Malignant neoplasm of prostate: Secondary | ICD-10-CM

## 2023-11-05 DIAGNOSIS — G893 Neoplasm related pain (acute) (chronic): Secondary | ICD-10-CM

## 2023-11-05 DIAGNOSIS — Z515 Encounter for palliative care: Secondary | ICD-10-CM

## 2023-11-05 MED ORDER — FENTANYL 37.5 MCG/HR TD PT72: 1.0000 | MEDICATED_PATCH | TRANSDERMAL | 0 refills | Status: DC

## 2023-11-07 ENCOUNTER — Inpatient Hospital Stay: Payer: Medicare Other | Attending: Nurse Practitioner | Admitting: Nurse Practitioner

## 2023-11-07 ENCOUNTER — Other Ambulatory Visit: Payer: Self-pay | Admitting: Nurse Practitioner

## 2023-11-07 ENCOUNTER — Encounter: Payer: Self-pay | Admitting: Nurse Practitioner

## 2023-11-07 DIAGNOSIS — G893 Neoplasm related pain (acute) (chronic): Secondary | ICD-10-CM

## 2023-11-07 DIAGNOSIS — C7951 Secondary malignant neoplasm of bone: Secondary | ICD-10-CM | POA: Insufficient documentation

## 2023-11-07 DIAGNOSIS — Z515 Encounter for palliative care: Secondary | ICD-10-CM | POA: Diagnosis not present

## 2023-11-07 DIAGNOSIS — C61 Malignant neoplasm of prostate: Secondary | ICD-10-CM

## 2023-11-07 DIAGNOSIS — Z923 Personal history of irradiation: Secondary | ICD-10-CM | POA: Insufficient documentation

## 2023-11-07 DIAGNOSIS — Z79899 Other long term (current) drug therapy: Secondary | ICD-10-CM | POA: Insufficient documentation

## 2023-11-07 DIAGNOSIS — C7802 Secondary malignant neoplasm of left lung: Secondary | ICD-10-CM | POA: Insufficient documentation

## 2023-11-07 MED ORDER — FENTANYL 25 MCG/HR TD PT72: 1.0000 | MEDICATED_PATCH | TRANSDERMAL | 0 refills | Status: DC

## 2023-11-07 MED ORDER — FENTANYL 12 MCG/HR TD PT72: 1.0000 | MEDICATED_PATCH | TRANSDERMAL | 0 refills | Status: DC

## 2023-11-07 MED ORDER — MORPHINE SULFATE 15 MG PO TABS: 15.0000 mg | ORAL_TABLET | Freq: Three times a day (TID) | ORAL | 0 refills | Status: DC

## 2023-11-08 ENCOUNTER — Telehealth: Payer: Self-pay | Admitting: Gastroenterology

## 2023-11-08 NOTE — Telephone Encounter (Signed)
Left message for patient to call back.   We have not yet received results of abdominal xray done on 10/29/23. I will contact radiology and ask that they read this test.

## 2023-11-08 NOTE — Telephone Encounter (Signed)
Spoke to patient regarding imaging reading showing moderate stool burden/constipation. Advised that Dr Barron Alvine would like him to complete a miralax bowel purge, then once daily miralax 1 capful after that with titration depending on bowels. Goal is for soft formed bowel movements without straining. Also advised that constipation may be medication induced. Patient to call us back should the above measures be ineffective. He verbalizes understanding.

## 2023-11-08 NOTE — Telephone Encounter (Signed)
Inbound call from patient requesting to speak with a nurse in regards to lab work. Please advise.   Thank you

## 2023-11-08 NOTE — Telephone Encounter (Signed)
Patient called and stated that he recently had some imaging done and was inquiring about the results. As well as stated he received a call from [facility] and requested a call back. Please advise.

## 2023-11-15 ENCOUNTER — Inpatient Hospital Stay: Payer: Medicare Other

## 2023-11-15 ENCOUNTER — Telehealth: Payer: Self-pay | Admitting: *Deleted

## 2023-11-15 ENCOUNTER — Other Ambulatory Visit: Payer: Self-pay | Admitting: *Deleted

## 2023-11-15 DIAGNOSIS — C7802 Secondary malignant neoplasm of left lung: Secondary | ICD-10-CM | POA: Diagnosis not present

## 2023-11-15 DIAGNOSIS — C61 Malignant neoplasm of prostate: Secondary | ICD-10-CM

## 2023-11-15 DIAGNOSIS — Z79899 Other long term (current) drug therapy: Secondary | ICD-10-CM | POA: Diagnosis not present

## 2023-11-15 DIAGNOSIS — Z923 Personal history of irradiation: Secondary | ICD-10-CM | POA: Diagnosis not present

## 2023-11-15 DIAGNOSIS — C7951 Secondary malignant neoplasm of bone: Secondary | ICD-10-CM | POA: Diagnosis present

## 2023-11-15 LAB — CBC WITH DIFFERENTIAL (CANCER CENTER ONLY)
Abs Immature Granulocytes: 0 10*3/uL (ref 0.00–0.07)
Basophils Absolute: 0 10*3/uL (ref 0.0–0.1)
Basophils Relative: 1 %
Eosinophils Absolute: 0.1 10*3/uL (ref 0.0–0.5)
Eosinophils Relative: 4 %
HCT: 31.9 % — ABNORMAL LOW (ref 39.0–52.0)
Hemoglobin: 10.5 g/dL — ABNORMAL LOW (ref 13.0–17.0)
Immature Granulocytes: 0 %
Lymphocytes Relative: 32 %
Lymphs Abs: 1 10*3/uL (ref 0.7–4.0)
MCH: 35.7 pg — ABNORMAL HIGH (ref 26.0–34.0)
MCHC: 32.9 g/dL (ref 30.0–36.0)
MCV: 108.5 fL — ABNORMAL HIGH (ref 80.0–100.0)
Monocytes Absolute: 0.3 10*3/uL (ref 0.1–1.0)
Monocytes Relative: 11 %
Neutro Abs: 1.6 10*3/uL — ABNORMAL LOW (ref 1.7–7.7)
Neutrophils Relative %: 52 %
Platelet Count: 161 10*3/uL (ref 150–400)
RBC: 2.94 MIL/uL — ABNORMAL LOW (ref 4.22–5.81)
RDW: 12.9 % (ref 11.5–15.5)
WBC Count: 3 10*3/uL — ABNORMAL LOW (ref 4.0–10.5)
nRBC: 0 % (ref 0.0–0.2)

## 2023-11-15 LAB — CMP (CANCER CENTER ONLY)
ALT: <5 U/L (ref 0–44)
AST: 21 U/L (ref 15–41)
Albumin: 4.5 g/dL (ref 3.5–5.0)
Alkaline Phosphatase: 60 U/L (ref 38–126)
Anion gap: 4 — ABNORMAL LOW (ref 5–15)
BUN: 23 mg/dL (ref 8–23)
CO2: 28 mmol/L (ref 22–32)
Calcium: 9.9 mg/dL (ref 8.9–10.3)
Chloride: 106 mmol/L (ref 98–111)
Creatinine: 1.2 mg/dL (ref 0.61–1.24)
GFR, Estimated: >60 mL/min (ref >60)
Glucose, Bld: 119 mg/dL — ABNORMAL HIGH (ref 70–99)
Potassium: 4.4 mmol/L (ref 3.5–5.1)
Sodium: 138 mmol/L (ref 135–145)
Total Bilirubin: 1 mg/dL (ref <1.2)
Total Protein: 7.1 g/dL (ref 6.5–8.1)

## 2023-11-15 NOTE — Telephone Encounter (Signed)
Received vm message from pt requesting a lab appt for today as he would like the results to be available when he sees Dr. Leonides Schanz on 11/18/23. Scheduled lab appt for today @ 2pm. TCT patient to advise him of this appt. No answer but was able to leave appt time on his identified phone vm.

## 2023-11-17 LAB — PROSTATE-SPECIFIC AG, SERUM (LABCORP): Prostate Specific Ag, Serum: 8.8 ng/mL — ABNORMAL HIGH (ref 0.0–4.0)

## 2023-11-17 LAB — TESTOSTERONE: Testosterone: <3 ng/dL — ABNORMAL LOW (ref 264–916)

## 2023-11-17 NOTE — Progress Notes (Unsigned)
[facility] [phone]   [phone]  PROGRESS NOTE  Patient Care Team: Arnette Felts, FNP as PCP - General (General Practice) Pickenpack-Cousar, Arty Baumgartner, NP as Nurse Practitioner (Nurse Practitioner) Alisa Graff, Pascal Lux, LCSW as Social Worker (Licensed Clinical Social Worker) Margaretmary Dys, MD as Consulting Physician (Radiation Oncology)  Hematological/Oncological History # Metastatic castrate sensitive prostate cancer. 01/15/22: CT-guided biopsy of a pulmonary nodule confirmed the presence of prostate cancer 01/23/2022:  Firmagon 240 mg started  Feb 2023: radiation therapy to the left sacroiliac pelvic bones completed. He received 30 Gray in 10 fractions  03/06/2022: Abiraterone 1000 mg daily with prednisone 5 mg daily started  01/03/2023: last visit with Dr. Clelia Croft. Restarted 500 mg abiraterone daily.  02/06/2023: transfer care to Dr. Leonides Schanz. Increased dose to 1000 mg abiraterone daily.  04/11/2023: Due to increasing PSA transition to rucaparib 600 mg twice daily 07/03/2023: Held Rucaparib due to neutropenia and anemia.  07/31/2023: Resume Rucaparib 300 mg PO BID.  11/18/2023: Increase Rucaparib to 300 mg  in AM and 600 mg in PM.   Interval History:  Frederick Ponce 77 y.o. 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 The patient's last visit was on 11/07/2023. In the interim, he has continued Rucaparib.  Today he is accompanied by his husband.  On exam today Frederick Ponce reports he has been continue to have trouble with constipation and reports that once a week he has a "calamity".  He notes that it is a very large bowel movement where he "goes for 45 minutes".  He notes it is not truly diarrhea but it is very large formed stool.  He reports that he is disheartened by the fact that his PSA has risen to 8.8.  He notes that he would like to increase his Rubraca  rather than consider switching treatments at this time.  He notes he is not having any bone pain or back pain.  Overall his health has been steady and he is had no recent illnesses.  He denies any nausea, vomiting, or diarrhea.  He continues taking his Rubraca faithfully on a daily basis.  He denies fevers, chills, sweats, shortness of breath, chest pain or cough.  A full 10 point ROS was otherwise negative.   MEDICAL HISTORY:  Past Medical History:  Diagnosis Date   Arthritis    Depression    Family history of melanoma    Family history of prostate cancer    GERD (gastroesophageal reflux disease)    Hyperlipidemia    Hypertension    Parkinson disease (HCC)    Prostate cancer (HCC)     SURGICAL HISTORY: No past surgical history on file.  SOCIAL HISTORY: Social History   Socioeconomic History   Marital status: Married    Spouse name: Janae Sauce   Number of children: 0   Years of education: Not on file   Highest education level: Associate degree: academic program  Occupational History   Not on file  Tobacco Use   Smoking status: Never   Smokeless tobacco: Never  Vaping Use   Vaping status: Never Used  Substance and Sexual Activity   Alcohol use: Yes    Alcohol/week: 15.0 standard drinks of alcohol    Types: 15 Glasses of wine per week    Comment: couple glasses a night   Drug use: Never   Sexual activity: Not Currently    Birth control/protection:  None  Other Topics Concern   Not on file  Social History Narrative   Not on file   Social Determinants of Health   Financial Resource Strain: Low Risk  (08/09/2023)   Overall Financial Resource Strain (CARDIA)    Difficulty of Paying Living Expenses: Not hard at all  Food Insecurity: No Food Insecurity (10/21/2023)   Hunger Vital Sign    Worried About Running Out of Food in the Last Year: Never true    Ran Out of Food in the Last Year: Never true  Transportation Needs: No Transportation Needs (10/21/2023)   PRAPARE  - Administrator, Civil Service (Medical): No    Lack of Transportation (Non-Medical): No  Physical Activity: Inactive (08/09/2023)   Exercise Vital Sign    Days of Exercise per Week: 0 days    Minutes of Exercise per Session: 0 min  Stress: No Stress Concern Present (08/09/2023)   [employer] of Occupational Health - Occupational Stress Questionnaire    Feeling of Stress : Only a little  Social Connections: Moderately Isolated (08/09/2023)   Social Connection and Isolation Panel [NHANES]    Frequency of Communication with Friends and Family: More than three times a week    Frequency of Social Gatherings with Friends and Family: Once a week    Attends Religious Services: Never    Database administrator or Organizations: No    Attends Engineer, structural: Not on file    Marital Status: Married  Catering manager Violence: Not At Risk (10/21/2023)   Humiliation, Afraid, Rape, and Kick questionnaire    Fear of Current or Ex-Partner: No    Emotionally Abused: No    Physically Abused: No    Sexually Abused: No    FAMILY HISTORY: Family History  Problem Relation Age of Onset   Kidney disease Mother    Colon cancer Mother    Heart disease Father    Tremor Father    Parkinson's disease Father    Prostate cancer Father    Melanoma Sister    Parkinson's disease Sister    Melanoma Sister        melanoma x2   Prostate cancer Brother    Parkinson's disease Paternal Grandmother    Lung cancer Paternal Grandfather    Parkinson's disease Paternal Aunt    Lung cancer Paternal Aunt     ALLERGIES:  is allergic to gabapentin and celebrex [celecoxib].  MEDICATIONS:  Current Outpatient Medications  Medication Sig Dispense Refill   acetaminophen (TYLENOL) 500 MG tablet Take 1,000 mg by mouth every 6 (six) hours as needed.     amLODipine (NORVASC) 5 MG tablet Take 1 tablet (5 mg total) by mouth daily. 90 tablet 1   atorvastatin (LIPITOR) 40 MG tablet TAKE 1 TABLET  BY MOUTH EVERY MORNING 90 tablet 1   calcium-vitamin D (OSCAL WITH D) 500-5 MG-MCG tablet Take 1 tablet by mouth 2 (two) times daily. 60 tablet 3   carbidopa-levodopa (SINEMET) 25-100 MG tablet Take 1 tablet by mouth 4 (four) times daily. 360 tablet 3   COENZYME Q10 PO Take 1 capsule by mouth every morning.     fentaNYL (DURAGESIC) 12 MCG/HR Place 1 patch onto the skin every 3 (three) days. 10 patch 0   fentaNYL (DURAGESIC) 25 MCG/HR Place 1 patch onto the skin every 3 (three) days. 10 patch 0   ibuprofen (ADVIL) 200 MG tablet Take 200 mg by mouth every 6 (six) hours as needed.  KRILL OIL PO Take 1 capsule by mouth every morning.     morphine (MSIR) 15 MG tablet Take 1 tablet (15 mg total) by mouth every 8 (eight) hours. 45 tablet 0   multivitamin-lutein (OCUVITE-LUTEIN) CAPS capsule Take 1 capsule by mouth every morning.     rucaparib camsylate (RUBRACA) 300 MG tablet Take 1 tablet (300 mg total) by mouth 2 (two) times daily. 60 tablet 1   vitamin B-12 (CYANOCOBALAMIN) 1000 MCG tablet Take 1,000 mcg by mouth every morning.     No current facility-administered medications for this visit.    REVIEW OF SYSTEMS:   Constitutional: ( - ) fevers, ( - )  chills , ( - ) night sweats Eyes: ( - ) blurriness of vision, ( - ) double vision, ( - ) watery eyes Ears, nose, mouth, throat, and face: ( - ) mucositis, ( - ) sore throat Respiratory: ( - ) cough, ( - ) dyspnea, ( - ) wheezes Cardiovascular: ( - ) palpitation, ( - ) chest discomfort, ( - ) lower extremity swelling Gastrointestinal:  ( - ) nausea, ( - ) heartburn, ( - ) change in bowel habits Skin: ( - ) abnormal skin rashes Lymphatics: ( - ) new lymphadenopathy, ( - ) easy bruising Neurological: ( - ) numbness, ( - ) tingling, ( - ) new weaknesses Behavioral/Psych: ( - ) mood change, ( - ) new changes  All other systems were reviewed with the patient and are negative.  PHYSICAL EXAMINATION: ECOG PERFORMANCE STATUS: 1 - Symptomatic but  completely ambulatory  Vitals:   11/18/23 1523  BP: (!) 144/71  Pulse: 82  Resp: 14  Temp: 98.3 F (36.8 C)  SpO2: 100%        Filed Weights   11/18/23 1523  Weight: 174 lb 9.6 oz (79.2 kg)     GENERAL: Well-appearing elderly Caucasian male, alert, no distress and comfortable SKIN: skin color, texture, turgor are normal, no rashes or significant lesions EYES: conjunctiva are pink and non-injected, sclera clear LUNGS: clear to auscultation and percussion with normal breathing effort HEART: regular rate & rhythm and no murmurs and no lower extremity edema Musculoskeletal: no cyanosis of digits and no clubbing  PSYCH: alert & oriented x 3, fluent speech NEURO: no focal motor/sensory deficits  LABORATORY DATA:  I have reviewed the data as listed    Latest Ref Rng & Units 11/15/2023    2:12 PM 10/22/2023    3:03 PM 09/24/2023    3:19 PM  CBC  WBC 4.0 - 10.5 K/uL 3.0  3.7  3.4   Hemoglobin 13.0 - 17.0 g/dL 13.2  44.0  10.2   Hematocrit 39.0 - 52.0 % 31.9  32.1  30.4   Platelets 150 - 400 K/uL 161  158  181        Latest Ref Rng & Units 11/15/2023    2:12 PM 10/22/2023    3:03 PM 09/24/2023    3:19 PM  CMP  Glucose 70 - 99 mg/dL 725  366  440   BUN 8 - 23 mg/dL 23  21  21    Creatinine 0.61 - 1.24 mg/dL 3.47  4.25  9.56   Sodium 135 - 145 mmol/L 138  138  139   Potassium 3.5 - 5.1 mmol/L 4.4  4.4  4.4   Chloride 98 - 111 mmol/L 106  107  107   CO2 22 - 32 mmol/L 28  25  28    Calcium 8.9 - 10.3  mg/dL 9.9  9.6  9.6   Total Protein 6.5 - 8.1 g/dL 7.1  7.0  6.6   Total Bilirubin <1.2 mg/dL 1.0  0.9  1.0   Alkaline Phos 38 - 126 U/L 60  55  57   AST 15 - 41 U/L 21  18  20    ALT 0 - 44 U/L 5  5  10      Lab Results  Component Value Date   MPROTEIN Not Observed 12/27/2021   Lab Results  Component Value Date   KPAFRELGTCHN 16.0 12/27/2021   LAMBDASER 12.2 12/27/2021   KAPLAMBRATIO 1.31 12/27/2021     RADIOGRAPHIC STUDIES: NM PET (PSMA) SKULL TO MID  THIGH  Result Date: 11/13/2023 CLINICAL DATA:  Prostate carcinoma with biochemical recurrence. EXAM: NUCLEAR MEDICINE PET SKULL BASE TO THIGH TECHNIQUE: 3.6 mCi Flotufolastat (Posluma) was injected intravenously. Full-ring PET imaging was performed from the skull base to thigh after the radiotracer. CT data was obtained and used for attenuation correction and anatomic localization. COMPARISON:  None Available. FINDINGS: NECK No radiotracer activity in neck lymph nodes. Incidental CT finding: None. CHEST Lobular nodule in the LEFT lower lobe measuring 2.6 x 2.1 cm has intense radiotracer activity with SUV max equal 12.7 (image 86). Multiple small intensely radiotracer avid mediastinal lymph nodes in the prevascular nodal station, RIGHT lower paratracheal nodal station ,LEFT hilum, and subcarinal nodal station. Example node in the prevascular space measures 10 mm (image 72) with cSUV max equal 14.9. Similar intense tracer activity in small lymph LEFT hilar node with SUV max equal 7.0 (image 75) Incidental CT finding: None. ABDOMEN/PELVIS Prostate: No focal activity in prostatectomy bed. Lymph nodes: No abnormal radiotracer accumulation within pelvic or abdominal nodes. Liver: No evidence of liver metastasis. Incidental CT finding: RIGHT renal pelvis calculus again noted. pelvicaliectasis on the RIGHT. Similar LEFT renal calculus without evidence obstruction. Ureters and bladder normal. SKELETON Multiple sclerotic lesions throughout the pelvis. Metabolic activity associated with the LEFT ischium and acetabulum with SUV max equal 8.5 on image 175.lesion in the LEFT sacral ala with SUV max equal 21.5. Lesion the posterior RIGHT iliac bone with SUV max equal 15.6. These lesions are predominantly accompanied lucency on the CT portion exam on the background of diffuse sclerosis. IMPRESSION: 1. Intense radiotracer activity associated with LEFT lower lobe pulmonary nodule. Findings metastatic prostate carcinoma. 2. Small  intensely avid mediastinal and LEFT hilar prostate cancer metastasis. 3. Three intensely radiotracer avid skeletal metastasis within the pelvis on the background of diffuse sclerotic change. 4. Bilateral renal calculi with RIGHT pelvicaliectasis. Electronically Signed   By: Genevive Bi M.D.   On: 11/13/2023 11:04   DG Abd 2 Views  Result Date: 11/08/2023 CLINICAL DATA:  Lower abdominal pain for 1 month. EXAM: ABDOMEN - 2 VIEW COMPARISON:  None Available. FINDINGS: No abnormal bowel dilatation. Moderate amount of stool seen throughout the colon. There is no evidence of free air. No radio-opaque calculi or other significant radiographic abnormality is seen. IMPRESSION: Moderate stool burden.  No abnormal bowel dilatation. Electronically Signed   By: Lupita Raider M.D.   On: 11/08/2023 13:05    ASSESSMENT & PLAN Frederick Ponce is a 77 y.o. 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  # Metastatic castrate sensitive prostate cancer. # Prostate cancer metastatic to the lung --Labs on 09/24/2023 show white blood cell 3.0, hemoglobin 10.5, MCV 108.5, platelets 161 --NM  PSMA scan on 11/04/2023 which showed evidence of progression of disease. --continue Rucaparib at 300 mg p.o. in the AM and 600 mg PO in PM.  If PSA continues rising would recommend Pluvicto therapy.  Discussed this with the patient who notes would be agreeable after trialing an increased dose of Rubraca. --Last dose of Eligard on 08/19/2023. Next dose in 4 months (Dec 2024).  --Proceed with Xgeva injection monthly. --RTC in 4 weeks for labs/follow up  No orders of the defined types were placed in this encounter.  All questions were answered. The patient knows to call the clinic with any problems, questions or concerns.  I have spent a total of 30 minutes minutes of face-to-face and non-face-to-face time, preparing to see the  patient, performing a medically appropriate examination, counseling and educating the patient, documenting clinical information in the electronic health record, independently interpreting results and communicating results to the patient, and care coordination.   Ulysees Barns, MD Department of Hematology/Oncology [facility] Phone: [phone] Pager: [phone] Email: [email or web address]   11/19/2023 2:24 PM

## 2023-11-18 ENCOUNTER — Inpatient Hospital Stay (HOSPITAL_BASED_OUTPATIENT_CLINIC_OR_DEPARTMENT_OTHER): Payer: Medicare Other | Admitting: Hematology and Oncology

## 2023-11-18 VITALS — BP 144/71 | HR 82 | Temp 98.3°F | Resp 14 | Wt 174.6 lb

## 2023-11-18 DIAGNOSIS — C61 Malignant neoplasm of prostate: Secondary | ICD-10-CM

## 2023-11-18 DIAGNOSIS — C7951 Secondary malignant neoplasm of bone: Secondary | ICD-10-CM

## 2023-11-19 ENCOUNTER — Encounter: Payer: Self-pay | Admitting: Hematology and Oncology

## 2023-11-19 ENCOUNTER — Other Ambulatory Visit (HOSPITAL_COMMUNITY): Payer: Self-pay

## 2023-11-19 NOTE — Telephone Encounter (Signed)
Oral Oncology Patient Advocate Encounter  Called and confirmed renewal application has been received and is complete. Final eligibility verification will take place at the beginning of January 2025.  Jinger Neighbors, CPhT-Adv Oncology Pharmacy Patient Advocate [facility] Direct Number: [phone]  Fax: [phone]

## 2023-11-20 ENCOUNTER — Telehealth: Payer: Self-pay | Admitting: Hematology and Oncology

## 2023-11-25 ENCOUNTER — Inpatient Hospital Stay: Payer: Medicare Other | Attending: Nurse Practitioner

## 2023-11-25 DIAGNOSIS — C61 Malignant neoplasm of prostate: Secondary | ICD-10-CM | POA: Insufficient documentation

## 2023-11-25 DIAGNOSIS — Z923 Personal history of irradiation: Secondary | ICD-10-CM | POA: Insufficient documentation

## 2023-11-25 DIAGNOSIS — C7951 Secondary malignant neoplasm of bone: Secondary | ICD-10-CM | POA: Insufficient documentation

## 2023-11-25 DIAGNOSIS — Z79899 Other long term (current) drug therapy: Secondary | ICD-10-CM | POA: Insufficient documentation

## 2023-11-25 LAB — CBC WITH DIFFERENTIAL (CANCER CENTER ONLY)
Abs Immature Granulocytes: 0 10*3/uL (ref 0.00–0.07)
Basophils Absolute: 0 10*3/uL (ref 0.0–0.1)
Basophils Relative: 1 %
Eosinophils Absolute: 0.1 10*3/uL (ref 0.0–0.5)
Eosinophils Relative: 3 %
HCT: 30.5 % — ABNORMAL LOW (ref 39.0–52.0)
Hemoglobin: 10.2 g/dL — ABNORMAL LOW (ref 13.0–17.0)
Immature Granulocytes: 0 %
Lymphocytes Relative: 29 %
Lymphs Abs: 1 10*3/uL (ref 0.7–4.0)
MCH: 36.2 pg — ABNORMAL HIGH (ref 26.0–34.0)
MCHC: 33.4 g/dL (ref 30.0–36.0)
MCV: 108.2 fL — ABNORMAL HIGH (ref 80.0–100.0)
Monocytes Absolute: 0.3 10*3/uL (ref 0.1–1.0)
Monocytes Relative: 10 %
Neutro Abs: 2 10*3/uL (ref 1.7–7.7)
Neutrophils Relative %: 57 %
Platelet Count: 153 10*3/uL (ref 150–400)
RBC: 2.82 MIL/uL — ABNORMAL LOW (ref 4.22–5.81)
RDW: 13.2 % (ref 11.5–15.5)
WBC Count: 3.5 10*3/uL — ABNORMAL LOW (ref 4.0–10.5)
nRBC: 0 % (ref 0.0–0.2)

## 2023-11-25 LAB — CMP (CANCER CENTER ONLY)
ALT: <5 U/L (ref 0–44)
AST: 16 U/L (ref 15–41)
Albumin: 4.3 g/dL (ref 3.5–5.0)
Alkaline Phosphatase: 58 U/L (ref 38–126)
Anion gap: 7 (ref 5–15)
BUN: 23 mg/dL (ref 8–23)
CO2: 26 mmol/L (ref 22–32)
Calcium: 9.6 mg/dL (ref 8.9–10.3)
Chloride: 105 mmol/L (ref 98–111)
Creatinine: 1.06 mg/dL (ref 0.61–1.24)
GFR, Estimated: >60 mL/min (ref >60)
Glucose, Bld: 110 mg/dL — ABNORMAL HIGH (ref 70–99)
Potassium: 4.2 mmol/L (ref 3.5–5.1)
Sodium: 138 mmol/L (ref 135–145)
Total Bilirubin: 0.7 mg/dL (ref <1.2)
Total Protein: 6.7 g/dL (ref 6.5–8.1)

## 2023-11-26 ENCOUNTER — Encounter: Payer: Self-pay | Admitting: General Practice

## 2023-11-26 LAB — PROSTATE-SPECIFIC AG, SERUM (LABCORP): Prostate Specific Ag, Serum: 11.2 ng/mL — ABNORMAL HIGH (ref 0.0–4.0)

## 2023-11-26 NOTE — Progress Notes (Signed)
[facility] Spiritual Care Note  Returned call from Frederick Ponce to schedule visit with him and his husband Frederick Ponce to discuss goals of care and other concerns related to prognosis on Thursday 12/5 at 3pm.   Chaplain Rush Barer, MDiv, [facility] Pager [phone] Voicemail [phone]

## 2023-11-27 ENCOUNTER — Telehealth: Payer: Self-pay | Admitting: Hematology and Oncology

## 2023-11-27 ENCOUNTER — Telehealth: Payer: Self-pay | Admitting: Neurology

## 2023-11-27 NOTE — Telephone Encounter (Signed)
LVM and sent mychart msg informing pt of need to reschedule 04/02/24 appt - NP out

## 2023-11-28 ENCOUNTER — Encounter: Payer: Self-pay | Admitting: General Practice

## 2023-11-28 NOTE — Progress Notes (Signed)
[facility] Spiritual Care Note  Met with Frederick Ponce and husband Frederick Ponce in Spiritual Care office as planned, providing compassionate presence, reflective listening, emotional support, affirmation of strengths, and normalization of feelings (especially in the context of grief as they consider end-of-life concerns). Helped them talk through goals, hopes, fears, conflicts, and needs related to treatment and quality-of-life questions and priorities.  Frederick Ponce plans to phone to schedule a follow-up appointment.   [address] Rush Barer, [place], [facility] Pager [phone] Voicemail [phone]

## 2023-12-02 ENCOUNTER — Inpatient Hospital Stay: Payer: Medicare Other

## 2023-12-02 DIAGNOSIS — C61 Malignant neoplasm of prostate: Secondary | ICD-10-CM

## 2023-12-02 LAB — CBC WITH DIFFERENTIAL (CANCER CENTER ONLY)
Abs Immature Granulocytes: 0.01 10*3/uL (ref 0.00–0.07)
Basophils Absolute: 0 10*3/uL (ref 0.0–0.1)
Basophils Relative: 1 %
Eosinophils Absolute: 0.1 10*3/uL (ref 0.0–0.5)
Eosinophils Relative: 3 %
HCT: 30 % — ABNORMAL LOW (ref 39.0–52.0)
Hemoglobin: 10.1 g/dL — ABNORMAL LOW (ref 13.0–17.0)
Immature Granulocytes: 0 %
Lymphocytes Relative: 35 %
Lymphs Abs: 1.2 10*3/uL (ref 0.7–4.0)
MCH: 36.5 pg — ABNORMAL HIGH (ref 26.0–34.0)
MCHC: 33.7 g/dL (ref 30.0–36.0)
MCV: 108.3 fL — ABNORMAL HIGH (ref 80.0–100.0)
Monocytes Absolute: 0.4 10*3/uL (ref 0.1–1.0)
Monocytes Relative: 12 %
Neutro Abs: 1.7 10*3/uL (ref 1.7–7.7)
Neutrophils Relative %: 49 %
Platelet Count: 180 10*3/uL (ref 150–400)
RBC: 2.77 MIL/uL — ABNORMAL LOW (ref 4.22–5.81)
RDW: 13.4 % (ref 11.5–15.5)
WBC Count: 3.4 10*3/uL — ABNORMAL LOW (ref 4.0–10.5)
nRBC: 0 % (ref 0.0–0.2)

## 2023-12-02 LAB — CMP (CANCER CENTER ONLY)
ALT: <5 U/L (ref 0–44)
AST: 13 U/L — ABNORMAL LOW (ref 15–41)
Albumin: 4.3 g/dL (ref 3.5–5.0)
Alkaline Phosphatase: 57 U/L (ref 38–126)
Anion gap: 6 (ref 5–15)
BUN: 23 mg/dL (ref 8–23)
CO2: 27 mmol/L (ref 22–32)
Calcium: 9.4 mg/dL (ref 8.9–10.3)
Chloride: 106 mmol/L (ref 98–111)
Creatinine: 0.94 mg/dL (ref 0.61–1.24)
GFR, Estimated: >60 mL/min (ref >60)
Glucose, Bld: 105 mg/dL — ABNORMAL HIGH (ref 70–99)
Potassium: 4.4 mmol/L (ref 3.5–5.1)
Sodium: 139 mmol/L (ref 135–145)
Total Bilirubin: 0.7 mg/dL (ref <1.2)
Total Protein: 6.8 g/dL (ref 6.5–8.1)

## 2023-12-03 LAB — PROSTATE-SPECIFIC AG, SERUM (LABCORP): Prostate Specific Ag, Serum: 14.3 ng/mL — ABNORMAL HIGH (ref 0.0–4.0)

## 2023-12-04 ENCOUNTER — Encounter: Payer: Self-pay | Admitting: Hematology and Oncology

## 2023-12-04 ENCOUNTER — Other Ambulatory Visit (HOSPITAL_COMMUNITY): Payer: Self-pay

## 2023-12-04 ENCOUNTER — Encounter: Payer: Self-pay | Admitting: Nurse Practitioner

## 2023-12-04 ENCOUNTER — Inpatient Hospital Stay (HOSPITAL_BASED_OUTPATIENT_CLINIC_OR_DEPARTMENT_OTHER): Payer: Medicare Other | Admitting: Nurse Practitioner

## 2023-12-04 ENCOUNTER — Other Ambulatory Visit: Payer: Self-pay

## 2023-12-04 VITALS — BP 139/71 | HR 71 | Temp 98.1°F | Resp 15 | Wt 174.3 lb

## 2023-12-04 DIAGNOSIS — C7951 Secondary malignant neoplasm of bone: Secondary | ICD-10-CM

## 2023-12-04 DIAGNOSIS — G893 Neoplasm related pain (acute) (chronic): Secondary | ICD-10-CM | POA: Diagnosis not present

## 2023-12-04 DIAGNOSIS — Z515 Encounter for palliative care: Secondary | ICD-10-CM

## 2023-12-04 DIAGNOSIS — C61 Malignant neoplasm of prostate: Secondary | ICD-10-CM

## 2023-12-04 DIAGNOSIS — Z7189 Other specified counseling: Secondary | ICD-10-CM

## 2023-12-04 DIAGNOSIS — R530 Neoplastic (malignant) related fatigue: Secondary | ICD-10-CM

## 2023-12-04 DIAGNOSIS — R630 Anorexia: Secondary | ICD-10-CM

## 2023-12-04 MED ORDER — FENTANYL 50 MCG/HR TD PT72
1.0000 | MEDICATED_PATCH | TRANSDERMAL | 0 refills | Status: DC
  Filled 2023-12-04: qty 10, 30d supply, fill #0

## 2023-12-04 NOTE — Progress Notes (Addendum)
Patient Care Team: Arnette Felts, FNP as PCP - General (General Practice) Pickenpack-Cousar, Arty Baumgartner, NP as Nurse Practitioner (Nurse Practitioner) Duffy, Pascal Lux, LCSW as Social Worker (Licensed Clinical Social Worker) Margaretmary Dys, MD as Consulting Physician (Radiation Oncology)   CHIEF COMPLAINT: Follow up metastatic prostate cancer   CURRENT THERAPY:  -Rucaparib 300 mg AM/600 mg PM and  -Eligard q4 months -Xgeva q4 months   INTERVAL HISTORY Frederick Ponce returns for follow up, last seen by Dr. Leonides Schanz 11/18/23. He stopped Rubraca a week ago when he saw PSA rising, thought why continue something that's not working. He remains very tired, has to stop mid task to rest and could fall asleep any minute. Today is a bad pain day, hurts all over. Pain is a combination of arthritis, neuropathy, and disease. Would like to talk to [place] today if possible. Here to discuss moving to Pluvicto as previously discussed, interested in trying but does not want to be wheelchair bound or transfusion dependent, etc. He is not able to enjoy usual activities, QOL is quite low.   ROS  All other systems reviewed and negative  Past Medical History:  Diagnosis Date   Arthritis    Depression    Family history of melanoma    Family history of prostate cancer    GERD (gastroesophageal reflux disease)    Hyperlipidemia    Hypertension    Parkinson disease (HCC)    Prostate cancer (HCC)      History reviewed. No pertinent surgical history.   Outpatient Encounter Medications as of 12/04/2023  Medication Sig   acetaminophen (TYLENOL) 500 MG tablet Take 1,000 mg by mouth every 6 (six) hours as needed.   amLODipine (NORVASC) 5 MG tablet Take 1 tablet (5 mg total) by mouth daily.   atorvastatin (LIPITOR) 40 MG tablet TAKE 1 TABLET BY MOUTH EVERY MORNING   calcium-vitamin D (OSCAL WITH D) 500-5 MG-MCG tablet Take 1 tablet by mouth 2 (two) times daily.   carbidopa-levodopa (SINEMET) 25-100 MG tablet Take 1  tablet by mouth 4 (four) times daily.   COENZYME Q10 PO Take 1 capsule by mouth every morning.   ibuprofen (ADVIL) 200 MG tablet Take 200 mg by mouth every 6 (six) hours as needed.   KRILL OIL PO Take 1 capsule by mouth every morning.   morphine (MSIR) 15 MG tablet Take 1 tablet (15 mg total) by mouth every 8 (eight) hours.   multivitamin-lutein (OCUVITE-LUTEIN) CAPS capsule Take 1 capsule by mouth every morning.   rucaparib camsylate (RUBRACA) 300 MG tablet Take 1 tablet (300 mg total) by mouth 2 (two) times daily.   vitamin B-12 (CYANOCOBALAMIN) 1000 MCG tablet Take 1,000 mcg by mouth every morning.   [DISCONTINUED] fentaNYL (DURAGESIC) 12 MCG/HR Place 1 patch onto the skin every 3 (three) days.   [DISCONTINUED] fentaNYL (DURAGESIC) 25 MCG/HR Place 1 patch onto the skin every 3 (three) days.   No facility-administered encounter medications on file as of 12/04/2023.     Today's Vitals   12/04/23 0959 12/04/23 1011  BP: 139/71   Pulse: 71   Resp: 15   Temp: 98.1 F (36.7 C)   TempSrc: Temporal   SpO2: 100%   Weight: 174 lb 4.8 oz (79.1 kg)   PainSc:  8    Body mass index is 26.5 kg/m.   PHYSICAL EXAM GENERAL:alert, no distress and comfortable SKIN: no rash  EYES: sclera clear LUNGS: clear with normal breathing effort HEART: regular rate & rhythm, no lower  extremity edema ABDOMEN: abdomen soft, non-tender and normal bowel sounds NEURO: alert & oriented x 3 with fluent speech, mild generalized weakness   CBC    Component Value Date/Time   WBC 3.4 (L) 12/02/2023 1503   WBC 4.6 08/29/2022 0414   RBC 2.77 (L) 12/02/2023 1503   HGB 10.1 (L) 12/02/2023 1503   HCT 30.0 (L) 12/02/2023 1503   PLT 180 12/02/2023 1503   MCV 108.3 (H) 12/02/2023 1503   MCH 36.5 (H) 12/02/2023 1503   MCHC 33.7 12/02/2023 1503   RDW 13.4 12/02/2023 1503   LYMPHSABS 1.2 12/02/2023 1503   MONOABS 0.4 12/02/2023 1503   EOSABS 0.1 12/02/2023 1503   BASOSABS 0.0 12/02/2023 1503     CMP      Component Value Date/Time   NA 139 12/02/2023 1503   NA 140 09/14/2021 0920   K 4.4 12/02/2023 1503   CL 106 12/02/2023 1503   CO2 27 12/02/2023 1503   GLUCOSE 105 (H) 12/02/2023 1503   BUN 23 12/02/2023 1503   BUN 18 09/14/2021 0920   CREATININE 0.94 12/02/2023 1503   CALCIUM 9.4 12/02/2023 1503   PROT 6.8 12/02/2023 1503   PROT 6.3 09/14/2021 0920   ALBUMIN 4.3 12/02/2023 1503   ALBUMIN 4.2 09/14/2021 0920   AST 13 (L) 12/02/2023 1503   ALT <5 12/02/2023 1503   ALKPHOS 57 12/02/2023 1503   BILITOT 0.7 12/02/2023 1503   GFRNONAA >60 12/02/2023 1503     ASSESSMENT & PLAN: 77 yo male   # Metastatic castrate sensitive prostate cancer. -01/15/22: CT-guided biopsy of a pulmonary nodule confirmed the presence of prostate cancer -01/23/2022:  Deborra Medina 240 mg started  -Feb 2023: radiation therapy to the left sacroiliac pelvic bones completed. He received 30 Gray in 10 fractions  -03/06/2022: Abiraterone 1000 mg daily with prednisone 5 mg daily started  -01/03/2023: last visit with Dr. Clelia Croft. Restarted 500 mg abiraterone daily.  -02/06/2023: transfer care to Dr. Leonides Schanz. Increased dose to 1000 mg abiraterone daily.  -04/11/2023: Due to increasing PSA transition to rucaparib 600 mg twice daily -07/03/2023: Held Rucaparib due to neutropenia and anemia.  -07/31/2023: Resume Rucaparib 300 mg PO BID.  -11/04/2023 PSMA with evidence of disease progression  -11/15/2023: PSA began rising 8.8 -11/18/2023: Increase Rucaparib to 300 mg  in AM and 600 mg in PM.  -11/25/2023: PSA increased to 11.2 -12/02/2023: PSA further increased 14.3 -12/04/2023: Frederick Ponce appears stable but presents with worsening body pain and low PS. Recent CBC/CMP are stable. Stopped Rubraca a week ago due to SEs and lack of efficacy in the setting of rising PSA -The recommendation is to move to next line Pluvicto, potential SE's reviewed.  -He is interested, but notes if he develops significant side effects he will not complete  the course. He doesn't want to burden others with his care, or be wheelchair bound or transfusion dependent etc. Also does not want systemic chemo. -Discussed GOC. Appreciate palliative care -Will refer to IR for pluvicto, with lab and f/up with Dr. Leonides Schanz a week prior to C2 -Pt seen with Dr. Leonides Schanz   PLAN: -Recent labs reviewed, rising PSA -Stop Rucaparib -Refer to IR for Pluvicto -Lab and F/up with Dr. Leonides Schanz 1 week prior to C2 -Continue Rivka Barbara and Eligard -F/up palliative care today -Pt seen with Dr. Leonides Schanz  Orders Placed This Encounter  Procedures   IR Radiologist Eval & Mgmt    Standing Status:   Future    Standing Expiration Date:   12/03/2024  Order Specific Question:   Reason for Exam (SYMPTOM  OR DIAGNOSIS REQUIRED)    Answer:   metastatic prostate cancer, rising PSA, referral for pluvicto    Order Specific Question:   Preferred Imaging Location?    Answer:   [facility]      All questions were answered. The patient knows to call the clinic with any problems, questions or concerns. No barriers to learning were detected.  Santiago Glad, NP-C 12/04/2023  I have read the above note and personally examined the patient. I agree with the assessment and plan as noted above.  Briefly Frederick Ponce is accompanied by his husband.  He notes that his PSA continues to rise despite his increase in the Rubraca dose.  Given these findings he is willing to switch therapies.  He notes that he does not wish to push any further with Rubraca due to concern for cytopenias and the rapidly rising PSA.  We discussed the risks and benefits of Pluvicto therapy any of the opportunity to ask questions regarding the treatment.  All questions were answered to his satisfaction.  We will have the Pluvicto scheduled as soon as is feasible and will plan to see the patient 1 week before administration of each treatment.   Ulysees Barns, MD Department of Hematology/Oncology [facility]  at [facility] Phone: [phone] Pager: [phone] Email: [email or web address]

## 2023-12-04 NOTE — Progress Notes (Signed)
Palliative Medicine [facility]  Telephone:(336) [phone] Fax:(336) [phone]   Name: Frederick Ponce Date: 12/04/2023 MRN: [id number]  DOB: [birth year]  Patient Care Team: Arnette Felts, FNP as PCP - General (General Practice) Pickenpack-Cousar, Arty Baumgartner, NP as Nurse Practitioner (Nurse Practitioner) Duffy, Pascal Lux, LCSW as Social Worker (Licensed Clinical Social Worker) Margaretmary Dys, MD as Consulting Physician (Radiation Oncology)    REASON FOR CONSULTATION: Frederick Ponce is a 77 y.o. male with medical history including Parkinson's disease and hyperlipidemia. 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 He is having urgent radiation appointment due to iliac bone pain. Palliative ask to see for symptom management.   SOCIAL HISTORY:     reports that he has never smoked. He has never used smokeless tobacco. He reports current alcohol use of about 15.0 standard drinks of alcohol per week. He reports that he does not use drugs.  ADVANCE DIRECTIVES:  Patient is scheduled to complete advanced directives at our clinic here at the cancer center on 02/16/2022. MOST form provided.  Patient plans to review and complete at a later time.  CODE STATUS:   PAST MEDICAL HISTORY: Past Medical History:  Diagnosis Date   Arthritis    Depression    Family history of melanoma    Family history of prostate cancer    GERD (gastroesophageal reflux disease)    Hyperlipidemia    Hypertension    Parkinson disease (HCC)    Prostate cancer (HCC)     PAST SURGICAL HISTORY: No past surgical history on file.  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  The report date is December 21, 2022.  The CancerNext-Expanded gene panel offered by [facility] and includes sequencing and rearrangement analysis for the following 77 genes: AIP, ALK, APC*, ATM*, AXIN2, BAP1, BARD1, BLM, BMPR1A, BRCA1*, BRCA2*, BRIP1*, CDC73, CDH1*, CDK4, CDKN1B, CDKN2A, CHEK2*, CTNNA1, DICER1, FANCC, FH, FLCN, GALNT12, KIF1B, LZTR1, MAX, MEN1, MET, MLH1*, MSH2*, MSH3, MSH6*, MUTYH*, NBN, NF1*, NF2, NTHL1, PALB2*, PHOX2B, PMS2*, POT1, PRKAR1A, PTCH1, PTEN*, RAD51C*, RAD51D*, RB1, RECQL, RET, SDHA, SDHAF2, SDHB, SDHC, SDHD, SMAD4, SMARCA4, SMARCB1, SMARCE1, STK11, SUFU, TMEM127, TP53*, TSC1, TSC2, VHL and XRCC2 (sequencing and deletion/duplication); EGFR, EGLN1, HOXB13, KIT, MITF, PDGFRA, POLD1, and POLE (sequencing only); EPCAM and GREM1 (deletion/duplication only). DNA and RNA analyses performed for * genes.      ALLERGIES:  is allergic to gabapentin and celebrex [celecoxib].  MEDICATIONS:  Current Outpatient Medications  Medication Sig Dispense Refill   acetaminophen (TYLENOL) 500 MG tablet Take 1,000 mg by mouth every 6 (six) hours as needed.     amLODipine (NORVASC) 5 MG tablet Take 1 tablet (5 mg total) by mouth daily. 90 tablet 1   atorvastatin (LIPITOR) 40 MG tablet TAKE 1 TABLET BY MOUTH EVERY MORNING 90 tablet 1   calcium-vitamin D (OSCAL WITH D) 500-5 MG-MCG tablet Take 1 tablet by mouth 2 (two) times daily. 60 tablet 3  carbidopa-levodopa (SINEMET) 25-100 MG tablet Take 1 tablet by mouth 4 (four) times daily. 360 tablet 3   COENZYME Q10 PO Take 1 capsule by mouth every morning.     fentaNYL (DURAGESIC) 12 MCG/HR Place 1 patch onto the skin every 3 (three) days. 10 patch 0   fentaNYL (DURAGESIC) 25  MCG/HR Place 1 patch onto the skin every 3 (three) days. 10 patch 0   ibuprofen (ADVIL) 200 MG tablet Take 200 mg by mouth every 6 (six) hours as needed.     KRILL OIL PO Take 1 capsule by mouth every morning.     morphine (MSIR) 15 MG tablet Take 1 tablet (15 mg total) by mouth every 8 (eight) hours. 45 tablet 0   multivitamin-lutein (OCUVITE-LUTEIN) CAPS capsule Take 1 capsule by mouth every morning.     rucaparib camsylate (RUBRACA) 300 MG tablet Take 1 tablet (300 mg total) by mouth 2 (two) times daily. 60 tablet 1   vitamin B-12 (CYANOCOBALAMIN) 1000 MCG tablet Take 1,000 mcg by mouth every morning.     No current facility-administered medications for this visit.    PERFORMANCE STATUS (ECOG) : 1 - Symptomatic but completely ambulatory  Discussed the use of AI scribe software for clinical note transcription with the patient, who gave verbal consent to proceed.   IMPRESSION: History of Present Illness [name] presented to clinic today for symptom management follow-up. His husband is present. Patient has history of chronic pain with ongoing concerns about the efficacy of his current pain management regimen and his overall poor quality of life. He met with his Oncology team today with plans to begin a new regimen ([place]uvicto). He expresses understanding that treatment options have become limited. Frederick Ponce quality of life is most important to him as he was an active and social gentleman in the past. Unfortunately, he has not been able to do so. He is not able to prepare for the upcoming Christmas holidays as he has in years past because of his overall condition which also saddens him. Emotional support provided.   Frederick Ponce's current pain regimen included fentanyl 37.5 mcg patch, which he applies every three days. States patch seems to lose effectiveness in the last twelve hours of each cycle. This has led to the patient supplementing with 'plugs' of morphine and over-the-counter Advil to manage  breakthrough pain. The timing of these episodes is unpredictable, and the patient expresses frustration at the resultant disruption to his daily life. I discussed realistically given his disease trajectory he may have breakthrough pain however the goal is to gain better control. We discussed making adjustments to his regimen with hopes of gaining Mr. Alpers better pain relief. We will increase his fentanyl patch to 50 mcg allowing for continued use of MS IR for breakthrough as needed.   The patient recalls a previous regimen involving extended-release morphine tablets, which he found effective in managing his pain. However, he understands that this cannot be combined with his current fentanyl patch due to both medications being labeled in the same classification.   In summary, the patient is seeking a more effective and predictable pain management strategy, as his current regimen of a fentanyl patch supplemented with morphine and Advil is not providing consistent relief. We will continue to closely monitor and adjust as needed. Frederick Ponce and [name] know to contact office as needed.  Assessment & Plan  Chronic Pain Management Current Fentanyl patch 38mcg/hr not providing adequate pain control for full 72 hours. Patient experiencing breakthrough pain  requiring additional morphine. Discussed options of changing patch every 2 days or increasing dose to 43mcg/hr every 3 days. Patient prefers single higher dose patch. -Increase Fentanyl patch to 68mcg/hr every 3 days. -Continue current breakthrough pain management with morphine as needed. -Check price and availability of 37mcg/hr Fentanyl patch at pharmacy. Prescription sent to [facility] pharmacy for immediate availability today.  -Will continue to closely monitor and adjust regimen as needed.  -Patient may continue to have breakthrough pain. Realistically pain may not completely go away however goal is to try to improve quality of life and allow for manageable  pain.  -Will follow-up by phone in a week to see how Mr. Fuson is tolerating adjustments. He knows to contact office sooner if needed.   Patient expressed understanding and was in agreement with this plan. He also understands that He can call the clinic at any time with any questions, concerns, or complaints.    Any controlled substances utilized were prescribed in the context of palliative care. PDMP has been reviewed.    Visit consisted of counseling and education dealing with the complex and emotionally intense issues of symptom management and palliative care in the setting of serious and potentially life-threatening illness.  Willette Alma, AGPCNP-BC  Palliative Medicine Team/[facility]

## 2023-12-05 ENCOUNTER — Encounter: Payer: Self-pay | Admitting: General Practice

## 2023-12-05 ENCOUNTER — Encounter: Payer: Self-pay | Admitting: Hematology and Oncology

## 2023-12-05 NOTE — Progress Notes (Signed)
[facility] Spiritual Care Note  Followed up with Frederick Ponce and his husband [name] in Spiritual Care office as scheduled to provide opportunity for them to share and process questions related to end-of-life planning, particularly priorities/goals for living and plans for disposition of valuables that also have deep sentimental (storied) significance.  Provided compassionate presence, reflective listening, emotional support, normalization of feelings, and suggestions for "third ways" to sidestep either/or assumptions by trying to meet underlying interests/needs. Plan to follow up at 3:30pm on Thursday 12/19.   [address] Rush [place], [place], [facility] Pager [phone] Voicemail [phone]

## 2023-12-10 ENCOUNTER — Inpatient Hospital Stay: Payer: Medicare Other

## 2023-12-10 DIAGNOSIS — C7951 Secondary malignant neoplasm of bone: Secondary | ICD-10-CM

## 2023-12-10 DIAGNOSIS — C61 Malignant neoplasm of prostate: Secondary | ICD-10-CM | POA: Diagnosis not present

## 2023-12-10 LAB — CMP (CANCER CENTER ONLY)
ALT: <5 U/L (ref 0–44)
AST: 14 U/L — ABNORMAL LOW (ref 15–41)
Albumin: 4.2 g/dL (ref 3.5–5.0)
Alkaline Phosphatase: 61 U/L (ref 38–126)
Anion gap: 5 (ref 5–15)
BUN: 18 mg/dL (ref 8–23)
CO2: 26 mmol/L (ref 22–32)
Calcium: 9.6 mg/dL (ref 8.9–10.3)
Chloride: 106 mmol/L (ref 98–111)
Creatinine: 0.81 mg/dL (ref 0.61–1.24)
GFR, Estimated: >60 mL/min (ref >60)
Glucose, Bld: 111 mg/dL — ABNORMAL HIGH (ref 70–99)
Potassium: 4.4 mmol/L (ref 3.5–5.1)
Sodium: 137 mmol/L (ref 135–145)
Total Bilirubin: 0.7 mg/dL (ref <1.2)
Total Protein: 6.5 g/dL (ref 6.5–8.1)

## 2023-12-10 LAB — CBC WITH DIFFERENTIAL (CANCER CENTER ONLY)
Abs Immature Granulocytes: 0 10*3/uL (ref 0.00–0.07)
Basophils Absolute: 0 10*3/uL (ref 0.0–0.1)
Basophils Relative: 0 %
Eosinophils Absolute: 0.1 10*3/uL (ref 0.0–0.5)
Eosinophils Relative: 3 %
HCT: 29.3 % — ABNORMAL LOW (ref 39.0–52.0)
Hemoglobin: 9.7 g/dL — ABNORMAL LOW (ref 13.0–17.0)
Immature Granulocytes: 0 %
Lymphocytes Relative: 28 %
Lymphs Abs: 1 10*3/uL (ref 0.7–4.0)
MCH: 35.8 pg — ABNORMAL HIGH (ref 26.0–34.0)
MCHC: 33.1 g/dL (ref 30.0–36.0)
MCV: 108.1 fL — ABNORMAL HIGH (ref 80.0–100.0)
Monocytes Absolute: 0.4 10*3/uL (ref 0.1–1.0)
Monocytes Relative: 11 %
Neutro Abs: 2.1 10*3/uL (ref 1.7–7.7)
Neutrophils Relative %: 58 %
Platelet Count: 197 10*3/uL (ref 150–400)
RBC: 2.71 MIL/uL — ABNORMAL LOW (ref 4.22–5.81)
RDW: 13.2 % (ref 11.5–15.5)
WBC Count: 3.6 10*3/uL — ABNORMAL LOW (ref 4.0–10.5)
nRBC: 0 % (ref 0.0–0.2)

## 2023-12-10 MED ORDER — DENOSUMAB 120 MG/1.7ML ~~LOC~~ SOLN
120.0000 mg | Freq: Once | SUBCUTANEOUS | Status: AC
  Administered 2023-12-10: 120 mg via SUBCUTANEOUS

## 2023-12-10 MED ORDER — LEUPROLIDE ACETATE (4 MONTH) 30 MG ~~LOC~~ KIT
30.0000 mg | PACK | SUBCUTANEOUS | Status: DC
  Administered 2023-12-10: 30 mg via SUBCUTANEOUS
  Filled 2023-12-10: qty 30

## 2023-12-10 NOTE — Progress Notes (Signed)
Ok to use labs from last week as pt has not received therapy since that time.

## 2023-12-11 LAB — PROSTATE-SPECIFIC AG, SERUM (LABCORP): Prostate Specific Ag, Serum: 20 ng/mL — ABNORMAL HIGH (ref 0.0–4.0)

## 2023-12-12 ENCOUNTER — Ambulatory Visit (HOSPITAL_COMMUNITY)
Admission: RE | Admit: 2023-12-12 | Discharge: 2023-12-12 | Disposition: A | Discharge status: Home / Self Care | Payer: Medicare Other | Source: Ambulatory Visit | Attending: Nurse Practitioner | Admitting: Nurse Practitioner

## 2023-12-12 ENCOUNTER — Encounter: Payer: Self-pay | Admitting: General Practice

## 2023-12-12 ENCOUNTER — Other Ambulatory Visit: Payer: Self-pay | Admitting: Hematology and Oncology

## 2023-12-12 ENCOUNTER — Ambulatory Visit: Payer: Medicare Other | Admitting: Hematology and Oncology

## 2023-12-12 DIAGNOSIS — C7951 Secondary malignant neoplasm of bone: Secondary | ICD-10-CM

## 2023-12-12 DIAGNOSIS — C61 Malignant neoplasm of prostate: Secondary | ICD-10-CM | POA: Insufficient documentation

## 2023-12-12 NOTE — Consult Note (Signed)
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  Referring Physician(s):Dorsey, Azucena Cecil   Patient Status: [facility] - Out-pt  History of Present Illness:  Frederick Ponce is a 77 y.o. male with metastatic prostate adenocarcinoma.  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  PSA increased from 02/23/2019 over the last month.  Patient has PSMA positive metastatic disease within the skeleton and mediastinal lymph nodes by PET scan 08/16/2022.      # Metastatic castrate sensitive prostate cancer. -01/15/22: CT-guided biopsy of a pulmonary nodule confirmed the presence of prostate cancer -01/23/2022:  Firmagon 240 mg started  -Feb 2023: radiation therapy to the left sacroiliac pelvic bones completed. He received 30 Gray in 10 fractions  -03/06/2022: Abiraterone 1000 mg daily with prednisone 5 mg daily started  -01/03/2023: last visit with Dr. Clelia Croft. Restarted 500 mg abiraterone daily.  -02/06/2023: transfer care to Dr. Leonides Schanz. Increased dose to 1000 mg abiraterone daily.  -04/11/2023: Due to increasing PSA transition to rucaparib 600 mg twice daily -07/03/2023: Held Rucaparib due to neutropenia and anemia.  -07/31/2023: Resume Rucaparib 300 mg PO BID.  -11/04/2023 PSMA with evidence of disease progression  -11/15/2023: PSA began rising 8.8 -11/18/2023: Increase Rucaparib to 300 mg  in AM and 600 mg in PM.  -11/25/2023: PSA increased to 11.2 -12/02/2023: PSA further increased 14.3 -12/04/2023: Frederick Ponce appears stable but presents with worsening body pain and low PS. Recent CBC/CMP are stable. Stopped Rubraca a week ago due to SEs and lack of efficacy in the setting of rising PSA  Expand All Collapse All       Patient Care Team: Arnette Felts, FNP as PCP - General (General Practice) Pickenpack-Cousar, Arty Baumgartner, NP as Nurse Practitioner (Nurse  Practitioner) Duffy, Pascal Lux, LCSW as Social Worker (Licensed Clinical Social Worker) Margaretmary Dys, MD as Consulting Physician (Radiation Oncology)    CHIEF COMPLAINT: Follow up metastatic prostate cancer    CURRENT THERAPY:  -Rucaparib 300 mg AM/600 mg PM and  -Eligard q4 months -Xgeva q4 months    INTERVAL HISTORY Frederick Ponce returns for follow up, last seen by Dr. Leonides Schanz 11/18/23. He stopped Rubraca a week ago when he saw PSA rising, thought why continue something that's not working. He remains very tired, has to stop mid task to rest and could fall asleep any minute. Today is a bad pain day, hurts all over. Pain is a combination of arthritis, neuropathy, and disease. Would like to talk to [facility] today if possible. Here to discuss moving to [place] as previously discussed, interested in trying but does not want to be wheelchair bound or transfusion dependent, etc. He is not able to enjoy usual activities, QOL is quite low.    ROS  All other systems reviewed and negative       Past Medical History:  Diagnosis Date   Arthritis     Depression     Family history of melanoma     Family history of prostate cancer     GERD (gastroesophageal reflux disease)     Hyperlipidemia     Hypertension     Parkinson disease (HCC)     Prostate cancer (HCC)            History reviewed. No pertinent surgical history.            Outpatient Encounter Medications as of 12/04/2023  Medication Sig   acetaminophen (TYLENOL) 500 MG  tablet Take 1,000 mg by mouth every 6 (six) hours as needed.   amLODipine (NORVASC) 5 MG tablet Take 1 tablet (5 mg total) by mouth daily.   atorvastatin (LIPITOR) 40 MG tablet TAKE 1 TABLET BY MOUTH EVERY MORNING   calcium-vitamin D (OSCAL WITH D) 500-5 MG-MCG tablet Take 1 tablet by mouth 2 (two) times daily.   carbidopa-levodopa (SINEMET) 25-100 MG tablet Take 1 tablet by mouth 4 (four) times daily.   COENZYME Q10 PO Take 1 capsule by mouth every morning.    ibuprofen (ADVIL) 200 MG tablet Take 200 mg by mouth every 6 (six) hours as needed.   KRILL OIL PO Take 1 capsule by mouth every morning.   morphine (MSIR) 15 MG tablet Take 1 tablet (15 mg total) by mouth every 8 (eight) hours.   multivitamin-lutein (OCUVITE-LUTEIN) CAPS capsule Take 1 capsule by mouth every morning.   rucaparib camsylate (RUBRACA) 300 MG tablet Take 1 tablet (300 mg total) by mouth 2 (two) times daily.   vitamin B-12 (CYANOCOBALAMIN) 1000 MCG tablet Take 1,000 mcg by mouth every morning.   [DISCONTINUED] fentaNYL (DURAGESIC) 12 MCG/HR Place 1 patch onto the skin every 3 (three) days.   [DISCONTINUED] fentaNYL (DURAGESIC) 25 MCG/HR Place 1 patch onto the skin every 3 (three) days.      No facility-administered encounter medications on file as of 12/04/2023.            Today's Vitals    12/04/23 0959 12/04/23 1011  BP: 139/71    Pulse: 71    Resp: 15    Temp: 98.1 F (36.7 C)    TempSrc: Temporal    SpO2: 100%    Weight: 174 lb 4.8 oz (79.1 kg)    PainSc:   8     Body mass index is 26.5 kg/m.    PHYSICAL EXAM GENERAL:alert, no distress and comfortable SKIN: no rash  EYES: sclera clear LUNGS: clear with normal breathing effort HEART: regular rate & rhythm, no lower extremity edema ABDOMEN: abdomen soft, non-tender and normal bowel sounds NEURO: alert & oriented x 3 with fluent speech, mild generalized weakness     CBC Labs (Brief)          Component Value Date/Time    WBC 3.4 (L) 12/02/2023 1503    WBC 4.6 08/29/2022 0414    RBC 2.77 (L) 12/02/2023 1503    HGB 10.1 (L) 12/02/2023 1503    HCT 30.0 (L) 12/02/2023 1503    PLT 180 12/02/2023 1503    MCV 108.3 (H) 12/02/2023 1503    MCH 36.5 (H) 12/02/2023 1503    MCHC 33.7 12/02/2023 1503    RDW 13.4 12/02/2023 1503    LYMPHSABS 1.2 12/02/2023 1503    MONOABS 0.4 12/02/2023 1503    EOSABS 0.1 12/02/2023 1503    BASOSABS 0.0 12/02/2023 1503        CMP     Labs (Brief)          Component  Value Date/Time    NA 139 12/02/2023 1503    NA 140 09/14/2021 0920    K 4.4 12/02/2023 1503    CL 106 12/02/2023 1503    CO2 27 12/02/2023 1503    GLUCOSE 105 (H) 12/02/2023 1503    BUN 23 12/02/2023 1503    BUN 18 09/14/2021 0920    CREATININE 0.94 12/02/2023 1503    CALCIUM 9.4 12/02/2023 1503    PROT 6.8 12/02/2023 1503    PROT 6.3 09/14/2021 0920  ALBUMIN 4.3 12/02/2023 1503    ALBUMIN 4.2 09/14/2021 0920    AST 13 (L) 12/02/2023 1503    ALT <5 12/02/2023 1503    ALKPHOS 57 12/02/2023 1503    BILITOT 0.7 12/02/2023 1503    GFRNONAA >60 12/02/2023 1503        ASSESSMENT & PLAN: 77 yo male        Past Medical History:  Diagnosis Date   Arthritis    Depression    Family history of melanoma    Family history of prostate cancer    GERD (gastroesophageal reflux disease)    Hyperlipidemia    Hypertension    Parkinson disease (HCC)    Prostate cancer (HCC)     No past surgical history on file.  Allergies: Gabapentin and Celebrex [celecoxib]  Medications: Prior to Admission medications   Medication Sig Start Date End Date Taking? Authorizing Provider  acetaminophen (TYLENOL) 500 MG tablet Take 1,000 mg by mouth every 6 (six) hours as needed.    [provider]  amLODipine (NORVASC) 5 MG tablet Take 1 tablet (5 mg total) by mouth daily. 08/09/23   Arnette Felts, FNP  atorvastatin (LIPITOR) 40 MG tablet TAKE 1 TABLET BY MOUTH EVERY MORNING 10/29/23   Arnette Felts, FNP  calcium-vitamin D (OSCAL WITH D) 500-5 MG-MCG tablet Take 1 tablet by mouth 2 (two) times daily. 03/13/22   Benjiman Core, MD  carbidopa-levodopa (SINEMET) 25-100 MG tablet Take 1 tablet by mouth 4 (four) times daily. 09/05/23   Glean Salvo, NP  COENZYME Q10 PO Take 1 capsule by mouth every morning.    [provider]  fentaNYL (DURAGESIC) 50 MCG/HR Place 1 patch onto the skin every 3 (three) days. 12/04/23   Pickenpack-Cousar, Arty Baumgartner, NP  ibuprofen (ADVIL) 200 MG tablet Take  200 mg by mouth every 6 (six) hours as needed.    [provider]  KRILL OIL PO Take 1 capsule by mouth every morning.    [provider]  morphine (MSIR) 15 MG tablet Take 1 tablet (15 mg total) by mouth every 8 (eight) hours. 11/07/23   Pickenpack-Cousar, Arty Baumgartner, NP  multivitamin-lutein (OCUVITE-LUTEIN) CAPS capsule Take 1 capsule by mouth every morning.    [provider]  rucaparib camsylate (RUBRACA) 300 MG tablet Take 1 tablet (300 mg total) by mouth 2 (two) times daily. 10/21/23   Jaci Standard, MD  vitamin B-12 (CYANOCOBALAMIN) 1000 MCG tablet Take 1,000 mcg by mouth every morning.    [provider]     Family History  Problem Relation Age of Onset   Kidney disease Mother    Colon cancer Mother    Heart disease Father    Tremor Father    Parkinson's disease Father    Prostate cancer Father    Melanoma Sister    Parkinson's disease Sister    Melanoma Sister        melanoma x2   Prostate cancer Brother    Parkinson's disease Paternal Grandmother    Lung cancer Paternal Grandfather    Parkinson's disease Paternal Aunt    Lung cancer Paternal Aunt     Social History   Socioeconomic History   Marital status: Married    Spouse name: Janae Sauce   Number of children: 0   Years of education: Not on file   Highest education level: Associate degree: academic program  Occupational History   Not on file  Tobacco Use   Smoking status: Never  Smokeless tobacco: Never  Vaping Use   Vaping status: Never Used  Substance and Sexual Activity   Alcohol use: Yes    Alcohol/week: 15.0 standard drinks of alcohol    Types: 15 Glasses of wine per week    Comment: couple glasses a night   Drug use: Never   Sexual activity: Not Currently    Birth control/protection: None  Other Topics Concern   Not on file  Social History Narrative   Not on file   Social Drivers of Health   Financial Resource Strain: Low Risk  (08/09/2023)   Overall  Financial Resource Strain (CARDIA)    Difficulty of Paying Living Expenses: Not hard at all  Food Insecurity: No Food Insecurity (10/21/2023)   Hunger Vital Sign    Worried About Running Out of Food in the Last Year: Never true    Ran Out of Food in the Last Year: Never true  Transportation Needs: No Transportation Needs (10/21/2023)   PRAPARE - Administrator, Civil Service (Medical): No    Lack of Transportation (Non-Medical): No  Physical Activity: Inactive (08/09/2023)   Exercise Vital Sign    Days of Exercise per Week: 0 days    Minutes of Exercise per Session: 0 min  Stress: No Stress Concern Present (08/09/2023)   [employer] of Occupational Health - Occupational Stress Questionnaire    Feeling of Stress : Only a little  Social Connections: Moderately Isolated (08/09/2023)   Social Connection and Isolation Panel [NHANES]    Frequency of Communication with Friends and Family: More than three times a week    Frequency of Social Gatherings with Friends and Family: Once a week    Attends Religious Services: Never    Database administrator or Organizations: No    Attends Engineer, structural: Not on file    Marital Status: Married    ECOG Status: 1 - Symptomatic but completely ambulatory  Review of Systems: A 12 point ROS discussed and pertinent positives are indicated in the HPI above.  All other systems are negative.  Review of Systems  Multifactorial pain including peripheral neuropathy, arthritis, and prostate cancer bone mets.  On fentanyl patch.  Vital Signs: There were no vitals taken for this visit.  Physical Exam Constitutional:      Appearance: Normal appearance. He is normal weight.  Neurological:     Mental Status: He is alert.     Imaging: No results found.  Labs:  CBC: Recent Labs    11/15/23 1412 11/25/23 1502 12/02/23 1503 12/10/23 1523  WBC 3.0* 3.5* 3.4* 3.6*  HGB 10.5* 10.2* 10.1* 9.7*  HCT 31.9* 30.5* 30.0*  29.3*  PLT 161 153 180 197    COAGS: No results for input(s): "INR", "APTT" in the last 8760 hours.  BMP: Recent Labs    11/15/23 1412 11/25/23 1502 12/02/23 1503 12/10/23 1523  NA 138 138 139 137  K 4.4 4.2 4.4 4.4  CL 106 105 106 106  CO2 28 26 27 26   GLUCOSE 119* 110* 105* 111*  BUN 23 23 23 18   CALCIUM 9.9 9.6 9.4 9.6  CREATININE 1.20 1.06 0.94 0.81  GFRNONAA >60 >60 >60 >60    LIVER FUNCTION TESTS: Recent Labs    11/15/23 1412 11/25/23 1502 12/02/23 1503 12/10/23 1523  BILITOT 1.0 0.7 0.7 0.7  AST 21 16 13* 14*  ALT <5 <5 <5 <5  ALKPHOS 60 58 57 61  PROT 7.1 6.7 6.8 6.5  ALBUMIN 4.5 4.3 4.3 4.2    TUMOR MARKERS: PSA = 20 on 12/10/23... increased from 3 on 10/22/23  Assessment and Plan:  [Patient is good candidate Lu 177 PSMA therapy ( vipivotide tetraxetan).  Patient demonstrates mild-to-moderate progression metastatic [lymphadenopathy and skeletal disease] identified on recent PSMA PET scan.  Additionally patient's PSA is increasing.  Patient has demonstrated progression on androgen deprivation and PARP  inhibitor therapy.   Patient explained major and minor risks and benefits of therapy.  Major benefit being progression-free survival.  Major risk being myelosuppression and renal toxicity. Minor toxicity of xerostomia.  All the patient's questions were answered.  Patient accompanied by spouse who was also present for consult.    Patient is scheduled for 6 treatments spaced 6 weeks apart.  Recommend following up with oncologist for CBC and CMP 1 week prior to each treatment to assess safety of continuing with therapy.      Thank you for this interesting consult.  I greatly enjoyed meeting Tychicus Sciuto and look forward to participating in their care.  A copy of this report was sent to the requesting provider on this date.  Electronically Signed: Patriciaann Clan, MD 12/12/2023, 11:26 AM   I spent a total of  30 Minutes   in face to face in clinical  consultation, greater than 50% of which was counseling/coordinating care for metastatic neuroendocrine tumor.

## 2023-12-12 NOTE — Progress Notes (Signed)
[facility] Spiritual Care Note  Followed up with Frederick Ponce and his husband [name] in Spiritual Care office as they continue to unpack their hopes related to his upcoming treatment and their goals for living in the coming months to a year, particularly related to meaning-making, life review, and storytelling as connected to their possessions and divestment, and consideration of a downsizing move. They note that their progress in addressing new perspectives (and older perspectives differently) as they share these conversations.  Frederick Ponce plans to phone to set up a follow-up appointment in the new year.   [address] [place], [place], [facility] Pager [phone] Voicemail [phone]

## 2023-12-16 ENCOUNTER — Inpatient Hospital Stay: Payer: Medicare Other

## 2023-12-16 ENCOUNTER — Telehealth: Payer: Self-pay | Admitting: Hematology and Oncology

## 2023-12-16 DIAGNOSIS — C61 Malignant neoplasm of prostate: Secondary | ICD-10-CM | POA: Diagnosis not present

## 2023-12-16 LAB — CMP (CANCER CENTER ONLY)
ALT: <5 U/L (ref 0–44)
AST: 12 U/L — ABNORMAL LOW (ref 15–41)
Albumin: 4.1 g/dL (ref 3.5–5.0)
Alkaline Phosphatase: 60 U/L (ref 38–126)
Anion gap: 5 (ref 5–15)
BUN: 17 mg/dL (ref 8–23)
CO2: 27 mmol/L (ref 22–32)
Calcium: 9.1 mg/dL (ref 8.9–10.3)
Chloride: 106 mmol/L (ref 98–111)
Creatinine: 0.79 mg/dL (ref 0.61–1.24)
GFR, Estimated: >60 mL/min (ref >60)
Glucose, Bld: 104 mg/dL — ABNORMAL HIGH (ref 70–99)
Potassium: 4.1 mmol/L (ref 3.5–5.1)
Sodium: 138 mmol/L (ref 135–145)
Total Bilirubin: 0.6 mg/dL (ref <1.2)
Total Protein: 6.4 g/dL — ABNORMAL LOW (ref 6.5–8.1)

## 2023-12-16 LAB — CBC WITH DIFFERENTIAL (CANCER CENTER ONLY)
Abs Immature Granulocytes: 0.01 10*3/uL (ref 0.00–0.07)
Basophils Absolute: 0 10*3/uL (ref 0.0–0.1)
Basophils Relative: 1 %
Eosinophils Absolute: 0.1 10*3/uL (ref 0.0–0.5)
Eosinophils Relative: 3 %
HCT: 28.8 % — ABNORMAL LOW (ref 39.0–52.0)
Hemoglobin: 9.5 g/dL — ABNORMAL LOW (ref 13.0–17.0)
Immature Granulocytes: 0 %
Lymphocytes Relative: 28 %
Lymphs Abs: 1 10*3/uL (ref 0.7–4.0)
MCH: 35.4 pg — ABNORMAL HIGH (ref 26.0–34.0)
MCHC: 33 g/dL (ref 30.0–36.0)
MCV: 107.5 fL — ABNORMAL HIGH (ref 80.0–100.0)
Monocytes Absolute: 0.4 10*3/uL (ref 0.1–1.0)
Monocytes Relative: 11 %
Neutro Abs: 2.1 10*3/uL (ref 1.7–7.7)
Neutrophils Relative %: 57 %
Platelet Count: 221 10*3/uL (ref 150–400)
RBC: 2.68 MIL/uL — ABNORMAL LOW (ref 4.22–5.81)
RDW: 13.2 % (ref 11.5–15.5)
WBC Count: 3.7 10*3/uL — ABNORMAL LOW (ref 4.0–10.5)
nRBC: 0 % (ref 0.0–0.2)

## 2023-12-17 LAB — PROSTATE-SPECIFIC AG, SERUM (LABCORP): Prostate Specific Ag, Serum: 26.9 ng/mL — ABNORMAL HIGH (ref 0.0–4.0)

## 2023-12-19 ENCOUNTER — Inpatient Hospital Stay: Payer: Medicare Other | Admitting: Hematology and Oncology

## 2023-12-20 ENCOUNTER — Inpatient Hospital Stay: Payer: Medicare Other | Admitting: Hematology and Oncology

## 2023-12-26 ENCOUNTER — Inpatient Hospital Stay: Payer: Medicare Other | Attending: Nurse Practitioner

## 2023-12-26 DIAGNOSIS — C7951 Secondary malignant neoplasm of bone: Secondary | ICD-10-CM | POA: Diagnosis present

## 2023-12-26 DIAGNOSIS — Z79899 Other long term (current) drug therapy: Secondary | ICD-10-CM | POA: Insufficient documentation

## 2023-12-26 DIAGNOSIS — C61 Malignant neoplasm of prostate: Secondary | ICD-10-CM | POA: Insufficient documentation

## 2023-12-26 LAB — CBC WITH DIFFERENTIAL (CANCER CENTER ONLY)
Abs Immature Granulocytes: 0.01 10*3/uL (ref 0.00–0.07)
Basophils Absolute: 0 10*3/uL (ref 0.0–0.1)
Basophils Relative: 0 %
Eosinophils Absolute: 0.1 10*3/uL (ref 0.0–0.5)
Eosinophils Relative: 2 %
HCT: 29.9 % — ABNORMAL LOW (ref 39.0–52.0)
Hemoglobin: 9.8 g/dL — ABNORMAL LOW (ref 13.0–17.0)
Immature Granulocytes: 0 %
Lymphocytes Relative: 19 %
Lymphs Abs: 0.9 10*3/uL (ref 0.7–4.0)
MCH: 35 pg — ABNORMAL HIGH (ref 26.0–34.0)
MCHC: 32.8 g/dL (ref 30.0–36.0)
MCV: 106.8 fL — ABNORMAL HIGH (ref 80.0–100.0)
Monocytes Absolute: 0.4 10*3/uL (ref 0.1–1.0)
Monocytes Relative: 7 %
Neutro Abs: 3.5 10*3/uL (ref 1.7–7.7)
Neutrophils Relative %: 72 %
Platelet Count: 270 10*3/uL (ref 150–400)
RBC: 2.8 MIL/uL — ABNORMAL LOW (ref 4.22–5.81)
RDW: 13.1 % (ref 11.5–15.5)
WBC Count: 4.9 10*3/uL (ref 4.0–10.5)
nRBC: 0 % (ref 0.0–0.2)

## 2023-12-26 LAB — CMP (CANCER CENTER ONLY)
ALT: <5 U/L (ref 0–44)
AST: 13 U/L — ABNORMAL LOW (ref 15–41)
Albumin: 3.9 g/dL (ref 3.5–5.0)
Alkaline Phosphatase: 61 U/L (ref 38–126)
Anion gap: 7 (ref 5–15)
BUN: 18 mg/dL (ref 8–23)
CO2: 27 mmol/L (ref 22–32)
Calcium: 9.3 mg/dL (ref 8.9–10.3)
Chloride: 106 mmol/L (ref 98–111)
Creatinine: 1.11 mg/dL (ref 0.61–1.24)
GFR, Estimated: >60 mL/min (ref >60)
Glucose, Bld: 108 mg/dL — ABNORMAL HIGH (ref 70–99)
Potassium: 4.6 mmol/L (ref 3.5–5.1)
Sodium: 140 mmol/L (ref 135–145)
Total Bilirubin: 0.6 mg/dL (ref 0.0–1.2)
Total Protein: 6.9 g/dL (ref 6.5–8.1)

## 2023-12-27 LAB — PROSTATE-SPECIFIC AG, SERUM (LABCORP): Prostate Specific Ag, Serum: 39.2 ng/mL — ABNORMAL HIGH (ref 0.0–4.0)

## 2023-12-30 ENCOUNTER — Inpatient Hospital Stay: Payer: Medicare Other | Admitting: Nurse Practitioner

## 2023-12-30 DIAGNOSIS — Z515 Encounter for palliative care: Secondary | ICD-10-CM

## 2023-12-30 DIAGNOSIS — R530 Neoplastic (malignant) related fatigue: Secondary | ICD-10-CM

## 2023-12-30 DIAGNOSIS — C7951 Secondary malignant neoplasm of bone: Secondary | ICD-10-CM

## 2023-12-30 DIAGNOSIS — C61 Malignant neoplasm of prostate: Secondary | ICD-10-CM | POA: Diagnosis not present

## 2023-12-30 DIAGNOSIS — G893 Neoplasm related pain (acute) (chronic): Secondary | ICD-10-CM | POA: Diagnosis not present

## 2023-12-30 MED ORDER — FENTANYL 50 MCG/HR TD PT72: 1.0000 | MEDICATED_PATCH | TRANSDERMAL | 0 refills | Status: DC

## 2023-12-30 NOTE — Progress Notes (Signed)
 Palliative Medicine [facility]  [phone] [phone]   Name: Frederick Ponce Date: 12/30/2023 MRN: [id number]  DOB: 02-15-1946  Patient Care Team: Georgina Speaks, FNP as PCP - General (General Practice) Pickenpack-Cousar, Fannie SAILOR, NP as Nurse Practitioner (Nurse Practitioner) Linton, Macario HERO, LCSW as Social Worker (Licensed Clinical Social Worker) Patrcia Cough, MD as Consulting Physician (Radiation Oncology)   I connected with Frederick Ponce on 12/30/23 at  2:50 PM EST by phone and verified that I am speaking with the correct person using two identifiers.   I discussed the limitations, risks, security and privacy concerns of performing an evaluation and management service by telemedicine and the availability of in-person appointments. I also discussed with the patient that there may be a patient responsible charge related to this service. The patient expressed understanding and agreed to proceed.   Other persons participating in the visit and their role in the encounter: n/a   Patient's location: home  Provider's location: [facility]   Chief Complaint: follow up of symptom management   REASON FOR CONSULTATION: Frederick Ponce is a 78 y.o. male with medical history including Parkinson's disease and hyperlipidemia. 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 He is having urgent radiation appointment due to iliac bone pain. Palliative ask to see for symptom management.   SOCIAL HISTORY:     reports that he has never smoked. He has never used smokeless tobacco. He reports current alcohol  use of about 15.0 standard drinks of alcohol  per week. He reports that he does not use drugs.  ADVANCE DIRECTIVES:  Patient is scheduled to complete advanced directives at our clinic here at the cancer center on 02/16/2022. MOST form provided.   Patient plans to review and complete at a later time.  CODE STATUS:   PAST MEDICAL HISTORY: Past Medical History:  Diagnosis Date   Arthritis    Depression    Family history of melanoma    Family history of prostate cancer    GERD (gastroesophageal reflux disease)    Hyperlipidemia    Hypertension    Parkinson disease (HCC)    Prostate cancer (HCC)     PAST SURGICAL HISTORY: No past surgical history on file.  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  The report date is December 21, 2022.  The CancerNext-Expanded gene panel offered by [facility] and includes sequencing and rearrangement analysis for the following 77 genes: AIP, ALK, APC*, ATM*, AXIN2, BAP1, BARD1, BLM, BMPR1A, BRCA1*, Frederick*, BRIP1*, CDC73, CDH1*, CDK4, CDKN1B, CDKN2A, CHEK2*, CTNNA1, DICER1, FANCC, FH, FLCN, GALNT12, KIF1B, LZTR1, MAX, MEN1, MET, MLH1*, MSH2*, MSH3, MSH6*, MUTYH*, NBN, NF1*, NF2, NTHL1, PALB2*, PHOX2B, PMS2*, POT1, PRKAR1A, PTCH1, PTEN*, RAD51C*, RAD51D*, RB1, RECQL, RET, SDHA, SDHAF2, SDHB, SDHC, SDHD, SMAD4, SMARCA4, SMARCB1, SMARCE1, STK11, SUFU, TMEM127, TP53*, TSC1, TSC2, VHL and XRCC2 (sequencing and deletion/duplication); EGFR, EGLN1, HOXB13, KIT, MITF, PDGFRA, POLD1, and POLE (sequencing only); EPCAM and GREM1 (deletion/duplication  only). DNA and RNA analyses performed for * genes.      ALLERGIES:  is allergic to gabapentin  and celebrex  [celecoxib ].  MEDICATIONS:  Current Outpatient Medications   Medication Sig Dispense Refill   acetaminophen  (TYLENOL ) 500 MG tablet Take 1,000 mg by mouth every 6 (six) hours as needed.     amLODipine  (NORVASC ) 5 MG tablet Take 1 tablet (5 mg total) by mouth daily. 90 tablet 1   atorvastatin  (LIPITOR) 40 MG tablet TAKE 1 TABLET BY MOUTH EVERY MORNING 90 tablet 1   calcium -vitamin D (OSCAL WITH D) 500-5 MG-MCG tablet Take 1 tablet by mouth 2 (two) times daily. 60 tablet 3   carbidopa -levodopa  (SINEMET ) 25-100 MG tablet Take 1 tablet by mouth 4 (four) times daily. 360 tablet 3   COENZYME Q10 PO Take 1 capsule by mouth every morning.     fentaNYL  (DURAGESIC ) 50 MCG/HR Place 1 patch onto the skin every 3 (three) days. 10 patch 0   ibuprofen  (ADVIL ) 200 MG tablet Take 200 mg by mouth every 6 (six) hours as needed.     KRILL OIL PO Take 1 capsule by mouth every morning.     morphine  (MSIR) 15 MG tablet Take 1 tablet (15 mg total) by mouth every 8 (eight) hours. 45 tablet 0   multivitamin-lutein (OCUVITE-LUTEIN) CAPS capsule Take 1 capsule by mouth every morning.     rucaparib camsylate  (RUBRACA ) 300 MG tablet Take 1 tablet (300 mg total) by mouth 2 (two) times daily. 60 tablet 1   vitamin B-12 (CYANOCOBALAMIN) 1000 MCG tablet Take 1,000 mcg by mouth every morning.     No current facility-administered medications for this visit.    PERFORMANCE STATUS (ECOG) : 1 - Symptomatic but completely ambulatory  Discussed the use of AI scribe software for clinical note transcription with the patient, who gave verbal consent to proceed.   IMPRESSION: History of Present Illness I connected by phone with Frederick Ponce for symptom management follow-up. He, denies nausea, vomiting, constipation, or diarrhea. Shares he was able to entertain family and friends over the Christmas holidays however not without some fatigue and pain. Continues to take things one day at a time however remains concerned with his quality of life. He reports plans for new upcoming therapies in relation  to his cancer also mentioning further elevation in PSA to almost 40.   Frederick Ponce continues to be challenged with multiple health concerns such as Parkinson's disease, neuropathy, and cancer, all playing a multifactorial role in his ongoing pain issues. The patient reports that the effectiveness of the current pain management regimen, which includes Fentanyl  50 mcg patch, is inconsistent and likened to a roll of the dice. Some days, the patch provides adequate relief, while on others, it does not. He also reports experiencing what he describes as anxiety attacks, particularly during periods of increased activity or stress, such as during the holidays. At times facilitated by sharp pain episodes. Patch frequency recently changed over the past 2 weeks to be changed every 48 hours vs 72 hours.   The patient describes a particularly bad day about a week and a half ago when he woke up feeling unable to face the day, a feeling that persisted and worsened as the day went on. The patient reports that he requires supplemental pain relief every day, typically in the form of morphine  IR or Advil . The choice of supplement depends on the time of day and the level of pain. [name] reports that the morphine  is particularly  effective at allowing for rest at bedtime.   Mr. Dornfeld also reports experiencing sharp pains in his legs, which have become more frequent. He also mentions experiencing headaches, which are a new symptom. These headaches are short-lived, lasting about an hour, and tend to occur around midday. Reports feeling lightheaded at times, but this symptom does not appear to have any consistency.  The patient is open to adjusting his pain management regimen, including potentially increasing the dosage of the patch, transitioning to methadone, or switching back to time-release morphine . However, he expresses a desire to see how the new treatment affects him before making any changes. We will plan to continue close  monitoring. Patient knows to contact office as needed.  Assessment & Plan  Chronic Cancer Related Pain Patient reports variable pain control with current regimen of Fentanyl  patch and supplemental morphine . Reports some days of high pain and others of manageable pain. Also reports occasional lightheadedness and sleepiness with morphine . -Continue current regimen of Fentanyl  50mcg patch and breakthrough morphine  IR every 4 hours as needed. -Consider increasing Fentanyl  patch dose, transitioning to methadone, or switching back to time-release morphine  if pain control remains inadequate. -Consider methadone as an alternative if other options are ineffective. -Plan to reassess in a few weeks or sooner if needed.  Headaches New onset of headaches, short-lived, occurring midday. -Continue to monitor symptoms. -Consider further evaluation if headaches persist or worsen.  Neuropathy Chronic condition, contributing to overall pain. -Continue current pain management strategy. -Consider additional interventions if pain control remains inadequate.  Parkinson's Disease Chronic condition, contributing to overall pain. -Continue current pain management strategy. -Consider additional interventions if pain control remains inadequate.  Cancer PSA level has increased to 40. White blood cells are within normal range. Patient is about to start a new treatment. -Continue to monitor PSA and white blood cell count per Oncology. -Consider impact of new cancer treatment on pain management strategy.  General Health Maintenance -Continue to monitor overall health and adjust treatment plan as necessary. -I will plan to see patient back in 1-2 weeks. He knows to contact office sooner if needed.  Patient expressed understanding and was in agreement with this plan. He also understands that He can call the clinic at any time with any questions, concerns, or complaints.    Any controlled substances utilized were  prescribed in the context of palliative care. PDMP has been reviewed.    Visit consisted of counseling and education dealing with the complex and emotionally intense issues of symptom management and palliative care in the setting of serious and potentially life-threatening illness.  Frederick Ponce, AGPCNP-BC  Palliative Medicine Team/[facility]

## 2023-12-31 ENCOUNTER — Encounter: Payer: Self-pay | Admitting: Nurse Practitioner

## 2023-12-31 ENCOUNTER — Encounter: Payer: Self-pay | Admitting: Hematology and Oncology

## 2023-12-31 ENCOUNTER — Other Ambulatory Visit: Payer: Self-pay | Admitting: Nurse Practitioner

## 2023-12-31 DIAGNOSIS — Z515 Encounter for palliative care: Secondary | ICD-10-CM

## 2023-12-31 DIAGNOSIS — G893 Neoplasm related pain (acute) (chronic): Secondary | ICD-10-CM

## 2023-12-31 DIAGNOSIS — C61 Malignant neoplasm of prostate: Secondary | ICD-10-CM

## 2023-12-31 MED ORDER — FENTANYL 50 MCG/HR TD PT72: 1.0000 | MEDICATED_PATCH | TRANSDERMAL | 0 refills | Status: DC

## 2024-01-01 ENCOUNTER — Other Ambulatory Visit: Payer: Self-pay | Admitting: Nurse Practitioner

## 2024-01-01 ENCOUNTER — Other Ambulatory Visit (HOSPITAL_COMMUNITY): Payer: Self-pay

## 2024-01-01 ENCOUNTER — Other Ambulatory Visit: Payer: Self-pay

## 2024-01-01 ENCOUNTER — Encounter: Payer: Self-pay | Admitting: Hematology and Oncology

## 2024-01-01 DIAGNOSIS — C7951 Secondary malignant neoplasm of bone: Secondary | ICD-10-CM

## 2024-01-01 DIAGNOSIS — C61 Malignant neoplasm of prostate: Secondary | ICD-10-CM

## 2024-01-01 DIAGNOSIS — G893 Neoplasm related pain (acute) (chronic): Secondary | ICD-10-CM

## 2024-01-01 DIAGNOSIS — Z515 Encounter for palliative care: Secondary | ICD-10-CM

## 2024-01-01 MED ORDER — FENTANYL 50 MCG/HR TD PT72
1.0000 | MEDICATED_PATCH | TRANSDERMAL | 0 refills | Status: DC
  Filled 2024-01-01: qty 15, 30d supply, fill #0

## 2024-01-02 ENCOUNTER — Encounter (HOSPITAL_COMMUNITY)
Admission: RE | Admit: 2024-01-02 | Discharge: 2024-01-02 | Disposition: A | Discharge status: Home / Self Care | Payer: Medicare Other | Source: Ambulatory Visit | Attending: Hematology and Oncology | Admitting: Hematology and Oncology

## 2024-01-02 DIAGNOSIS — C7951 Secondary malignant neoplasm of bone: Secondary | ICD-10-CM | POA: Diagnosis present

## 2024-01-02 DIAGNOSIS — C61 Malignant neoplasm of prostate: Secondary | ICD-10-CM | POA: Insufficient documentation

## 2024-01-02 MED ORDER — LUTETIUM LU 177 VIPIVOTIDE TET 1000 MBQ/ML IV SOLN
203.9000 | Freq: Once | INTRAVENOUS | Status: AC
  Administered 2024-01-02: 203.9 via INTRAVENOUS

## 2024-01-02 MED ORDER — SODIUM CHLORIDE 0.9 % IV BOLUS: 1000.0000 mL | Freq: Once | INTRAVENOUS | Status: DC

## 2024-01-02 MED ORDER — SODIUM CHLORIDE 0.9 % IV SOLN: INTRAVENOUS | Status: AC

## 2024-01-02 NOTE — Written Directive (Addendum)
  PLUVICTO   THERAPY   RADIOPHARMACEUTICAL: Lutetium 177 vipivotide tetraxetan (Pluvicto )     PRESCRIBED DOSE FOR ADMINISTRATION:  200 mCi   ROUTE OFADMINISTRATION:  IV   DIAGNOSIS: Prostate 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    REFERRING PHYSICIAN:Dorsey, Norleen ONEIDA CLORE, MD    TREATMENT #:1   ADDITIONAL PHYSICIAN COMMENTS/NOTES:   AUTHORIZED USER SIGNATURE & TIME STAMP: Norleen GORMAN Boxer, MD   01/02/24    11:45 AM

## 2024-01-02 NOTE — Progress Notes (Signed)
 CLINICAL DATA: [78 year old male with metastatic castrate resistant prostate adenocarcinoma.  PSMA positive metastatic disease PET scan 11/04/2023.]  EXAM: NUCLEAR MEDICINE PLUVICTO  INJECTION  TECHNIQUE: Infusion: The nuclear medicine technologist and I personally verified the dose activity to be delivered as specified in the written directive, and verified the patient identification via 2 separate methods.  Initial flush of the intravenous catheter was performed was sterile saline. The dose syringe was connected to the catheter and the Lu-177 Pluvicto  administered over a 1 to 10 min infusion. Single 10 cc  lushes with normal saline follow the dose. No complications were noted. The entire IV tubing, venocatheter, stopcock and syringes was removed in total, placed in a disposal bag and sent for assay of the residual activity, which will be reported at a later time in our EMR by the physics staff. Pressure was applied to the venipuncture site, and a compression bandage placed. Patient monitored for 1 hour following infusion.    Radiation Safety personnel were present to perform the discharge survey, as detailed on their documentation. After a short period of observation, the patient had his IV removed.  RADIOPHARMACEUTICALS: [203.2] microcuries Lu-177 PLUVICTO   FINDINGS: Current Infusion: [1]  Planned Infusions: 6    Patient presented to nuclear medicine for treatment. The patient's most recent blood counts were reviewed and remains a good candidate to proceed with Lu-177 Pluvicto .     PSA mildly increased to 39.       The patient was situated in an infusion suite with a contact barrier placed under the arm. Intravenous access was established, using sterile technique, and a normal saline infusion from a syringe was started.     Micro-dosimetry: The prescribed radiation activity was assayed and confirmed to be within specified tolerance.  IMPRESSION: Current Infusion:  [1]  Planned Infusions: 6    [The patient tolerated the infusion well. The patient will return in 6 weeks for ongoing care.]

## 2024-01-06 ENCOUNTER — Inpatient Hospital Stay (HOSPITAL_COMMUNITY)
Admission: EM | Admit: 2024-01-06 | Discharge: 2024-01-10 | DRG: 206 | Disposition: A | Discharge status: Home Health | Payer: Medicare Other | Attending: Internal Medicine | Admitting: Internal Medicine

## 2024-01-06 ENCOUNTER — Emergency Department (HOSPITAL_COMMUNITY): Payer: Medicare Other

## 2024-01-06 ENCOUNTER — Other Ambulatory Visit: Payer: Self-pay

## 2024-01-06 ENCOUNTER — Emergency Department (HOSPITAL_BASED_OUTPATIENT_CLINIC_OR_DEPARTMENT_OTHER): Payer: Medicare Other

## 2024-01-06 ENCOUNTER — Encounter (HOSPITAL_COMMUNITY): Payer: Self-pay

## 2024-01-06 DIAGNOSIS — Z808 Family history of malignant neoplasm of other organs or systems: Secondary | ICD-10-CM

## 2024-01-06 DIAGNOSIS — G893 Neoplasm related pain (acute) (chronic): Secondary | ICD-10-CM | POA: Diagnosis present

## 2024-01-06 DIAGNOSIS — Z8249 Family history of ischemic heart disease and other diseases of the circulatory system: Secondary | ICD-10-CM

## 2024-01-06 DIAGNOSIS — G629 Polyneuropathy, unspecified: Secondary | ICD-10-CM | POA: Diagnosis present

## 2024-01-06 DIAGNOSIS — Y842 Radiological procedure and radiotherapy as the cause of abnormal reaction of the patient, or of later complication, without mention of misadventure at the time of the procedure: Secondary | ICD-10-CM | POA: Diagnosis present

## 2024-01-06 DIAGNOSIS — Z1152 Encounter for screening for COVID-19: Secondary | ICD-10-CM

## 2024-01-06 DIAGNOSIS — J189 Pneumonia, unspecified organism: Principal | ICD-10-CM

## 2024-01-06 DIAGNOSIS — C61 Malignant neoplasm of prostate: Secondary | ICD-10-CM | POA: Diagnosis not present

## 2024-01-06 DIAGNOSIS — E785 Hyperlipidemia, unspecified: Secondary | ICD-10-CM | POA: Diagnosis present

## 2024-01-06 DIAGNOSIS — C7802 Secondary malignant neoplasm of left lung: Secondary | ICD-10-CM | POA: Diagnosis present

## 2024-01-06 DIAGNOSIS — F0280 Dementia in other diseases classified elsewhere without behavioral disturbance: Secondary | ICD-10-CM | POA: Diagnosis present

## 2024-01-06 DIAGNOSIS — D539 Nutritional anemia, unspecified: Secondary | ICD-10-CM | POA: Diagnosis present

## 2024-01-06 DIAGNOSIS — G20A1 Parkinson's disease without dyskinesia, without mention of fluctuations: Secondary | ICD-10-CM | POA: Diagnosis present

## 2024-01-06 DIAGNOSIS — R1310 Dysphagia, unspecified: Secondary | ICD-10-CM | POA: Diagnosis not present

## 2024-01-06 DIAGNOSIS — Z8616 Personal history of COVID-19: Secondary | ICD-10-CM | POA: Diagnosis not present

## 2024-01-06 DIAGNOSIS — Z800 Family history of malignant neoplasm of digestive organs: Secondary | ICD-10-CM | POA: Diagnosis not present

## 2024-01-06 DIAGNOSIS — R52 Pain, unspecified: Secondary | ICD-10-CM | POA: Diagnosis present

## 2024-01-06 DIAGNOSIS — M7989 Other specified soft tissue disorders: Secondary | ICD-10-CM

## 2024-01-06 DIAGNOSIS — J700 Acute pulmonary manifestations due to radiation: Secondary | ICD-10-CM | POA: Diagnosis present

## 2024-01-06 DIAGNOSIS — R627 Adult failure to thrive: Secondary | ICD-10-CM | POA: Diagnosis present

## 2024-01-06 DIAGNOSIS — I872 Venous insufficiency (chronic) (peripheral): Secondary | ICD-10-CM | POA: Diagnosis present

## 2024-01-06 DIAGNOSIS — Z515 Encounter for palliative care: Secondary | ICD-10-CM | POA: Diagnosis not present

## 2024-01-06 DIAGNOSIS — E871 Hypo-osmolality and hyponatremia: Secondary | ICD-10-CM | POA: Diagnosis present

## 2024-01-06 DIAGNOSIS — Z820 Family history of epilepsy and other diseases of the nervous system: Secondary | ICD-10-CM

## 2024-01-06 DIAGNOSIS — Z8042 Family history of malignant neoplasm of prostate: Secondary | ICD-10-CM

## 2024-01-06 DIAGNOSIS — I10 Essential (primary) hypertension: Secondary | ICD-10-CM | POA: Diagnosis present

## 2024-01-06 DIAGNOSIS — Z801 Family history of malignant neoplasm of trachea, bronchus and lung: Secondary | ICD-10-CM | POA: Diagnosis not present

## 2024-01-06 DIAGNOSIS — C7801 Secondary malignant neoplasm of right lung: Secondary | ICD-10-CM | POA: Diagnosis present

## 2024-01-06 DIAGNOSIS — Z85828 Personal history of other malignant neoplasm of skin: Secondary | ICD-10-CM | POA: Insufficient documentation

## 2024-01-06 DIAGNOSIS — E440 Moderate protein-calorie malnutrition: Secondary | ICD-10-CM | POA: Diagnosis present

## 2024-01-06 DIAGNOSIS — Z66 Do not resuscitate: Secondary | ICD-10-CM | POA: Diagnosis not present

## 2024-01-06 DIAGNOSIS — R531 Weakness: Secondary | ICD-10-CM

## 2024-01-06 DIAGNOSIS — R7989 Other specified abnormal findings of blood chemistry: Secondary | ICD-10-CM | POA: Diagnosis present

## 2024-01-06 DIAGNOSIS — C7951 Secondary malignant neoplasm of bone: Secondary | ICD-10-CM | POA: Diagnosis present

## 2024-01-06 DIAGNOSIS — Z79899 Other long term (current) drug therapy: Secondary | ICD-10-CM

## 2024-01-06 DIAGNOSIS — R600 Localized edema: Secondary | ICD-10-CM

## 2024-01-06 DIAGNOSIS — Z6826 Body mass index (BMI) 26.0-26.9, adult: Secondary | ICD-10-CM

## 2024-01-06 DIAGNOSIS — M1990 Unspecified osteoarthritis, unspecified site: Secondary | ICD-10-CM | POA: Diagnosis present

## 2024-01-06 DIAGNOSIS — Z888 Allergy status to other drugs, medicaments and biological substances status: Secondary | ICD-10-CM

## 2024-01-06 DIAGNOSIS — G8929 Other chronic pain: Secondary | ICD-10-CM | POA: Diagnosis present

## 2024-01-06 DIAGNOSIS — E663 Overweight: Secondary | ICD-10-CM | POA: Diagnosis present

## 2024-01-06 DIAGNOSIS — Z841 Family history of disorders of kidney and ureter: Secondary | ICD-10-CM

## 2024-01-06 LAB — CBC WITH DIFFERENTIAL/PLATELET
Abs Immature Granulocytes: 0.03 10*3/uL (ref 0.00–0.07)
Basophils Absolute: 0 10*3/uL (ref 0.0–0.1)
Basophils Relative: 0 %
Eosinophils Absolute: 0 10*3/uL (ref 0.0–0.5)
Eosinophils Relative: 1 %
HCT: 26.1 % — ABNORMAL LOW (ref 39.0–52.0)
Hemoglobin: 8.5 g/dL — ABNORMAL LOW (ref 13.0–17.0)
Immature Granulocytes: 1 %
Lymphocytes Relative: 8 %
Lymphs Abs: 0.5 10*3/uL — ABNORMAL LOW (ref 0.7–4.0)
MCH: 35 pg — ABNORMAL HIGH (ref 26.0–34.0)
MCHC: 32.6 g/dL (ref 30.0–36.0)
MCV: 107.4 fL — ABNORMAL HIGH (ref 80.0–100.0)
Monocytes Absolute: 0.5 10*3/uL (ref 0.1–1.0)
Monocytes Relative: 8 %
Neutro Abs: 4.9 10*3/uL (ref 1.7–7.7)
Neutrophils Relative %: 82 %
Platelets: 266 10*3/uL (ref 150–400)
RBC: 2.43 MIL/uL — ABNORMAL LOW (ref 4.22–5.81)
RDW: 13.5 % (ref 11.5–15.5)
WBC: 5.9 10*3/uL (ref 4.0–10.5)
nRBC: 0 % (ref 0.0–0.2)

## 2024-01-06 LAB — URINALYSIS, ROUTINE W REFLEX MICROSCOPIC
Bilirubin Urine: NEGATIVE
Glucose, UA: NEGATIVE mg/dL
Hgb urine dipstick: NEGATIVE
Ketones, ur: NEGATIVE mg/dL
Leukocytes,Ua: NEGATIVE
Nitrite: NEGATIVE
Protein, ur: NEGATIVE mg/dL
Specific Gravity, Urine: 1.014 (ref 1.005–1.030)
pH: 6 (ref 5.0–8.0)

## 2024-01-06 LAB — COMPREHENSIVE METABOLIC PANEL
ALT: 7 U/L (ref 0–44)
AST: 15 U/L (ref 15–41)
Albumin: 3 g/dL — ABNORMAL LOW (ref 3.5–5.0)
Alkaline Phosphatase: 53 U/L (ref 38–126)
Anion gap: 8 (ref 5–15)
BUN: 18 mg/dL (ref 8–23)
CO2: 22 mmol/L (ref 22–32)
Calcium: 8 mg/dL — ABNORMAL LOW (ref 8.9–10.3)
Chloride: 102 mmol/L (ref 98–111)
Creatinine, Ser: 0.75 mg/dL (ref 0.61–1.24)
GFR, Estimated: >60 mL/min (ref >60)
Glucose, Bld: 126 mg/dL — ABNORMAL HIGH (ref 70–99)
Potassium: 3.7 mmol/L (ref 3.5–5.1)
Sodium: 132 mmol/L — ABNORMAL LOW (ref 135–145)
Total Bilirubin: 0.6 mg/dL (ref 0.0–1.2)
Total Protein: 6.3 g/dL — ABNORMAL LOW (ref 6.5–8.1)

## 2024-01-06 LAB — RESP PANEL BY RT-PCR (RSV, FLU A&B, COVID)  RVPGX2
Influenza A by PCR: NEGATIVE
Influenza B by PCR: NEGATIVE
Resp Syncytial Virus by PCR: NEGATIVE
SARS Coronavirus 2 by RT PCR: NEGATIVE

## 2024-01-06 LAB — STREP PNEUMONIAE URINARY ANTIGEN: Strep Pneumo Urinary Antigen: NEGATIVE

## 2024-01-06 LAB — I-STAT CG4 LACTIC ACID, ED
Lactic Acid, Venous: 0.7 mmol/L (ref 0.5–1.9)
Lactic Acid, Venous: 0.5 mmol/L (ref 0.5–1.9)

## 2024-01-06 LAB — PROCALCITONIN: Procalcitonin: <0.1 ng/mL

## 2024-01-06 LAB — D-DIMER, QUANTITATIVE: D-Dimer, Quant: 3.14 ug{FEU}/mL — ABNORMAL HIGH (ref 0.00–0.50)

## 2024-01-06 LAB — BRAIN NATRIURETIC PEPTIDE: B Natriuretic Peptide: 139.5 pg/mL — ABNORMAL HIGH (ref 0.0–100.0)

## 2024-01-06 MED ORDER — PROSIGHT PO TABS
1.0000 | ORAL_TABLET | Freq: Every morning | ORAL | Status: DC
  Administered 2024-01-07 – 2024-01-10 (×4): 1 via ORAL
  Filled 2024-01-06 (×4): qty 1

## 2024-01-06 MED ORDER — HYDROMORPHONE HCL 1 MG/ML IJ SOLN
1.0000 mg | Freq: Once | INTRAMUSCULAR | Status: AC
Start: 2024-01-06 — End: 2024-01-06
  Administered 2024-01-06: 1 mg via INTRAVENOUS
  Filled 2024-01-06: qty 1

## 2024-01-06 MED ORDER — ACETAMINOPHEN 325 MG PO TABS
650.0000 mg | ORAL_TABLET | Freq: Four times a day (QID) | ORAL | Status: DC | PRN
  Administered 2024-01-09: 650 mg via ORAL
  Filled 2024-01-06: qty 2

## 2024-01-06 MED ORDER — SODIUM CHLORIDE 0.9 % IV BOLUS
1000.0000 mL | Freq: Once | INTRAVENOUS | Status: AC
  Administered 2024-01-06: 1000 mL via INTRAVENOUS

## 2024-01-06 MED ORDER — ACETAMINOPHEN 500 MG PO TABS: 1000.0000 mg | ORAL_TABLET | Freq: Four times a day (QID) | ORAL | Status: DC | PRN

## 2024-01-06 MED ORDER — HYDROMORPHONE HCL 1 MG/ML IJ SOLN
1.0000 mg | INTRAMUSCULAR | Status: DC | PRN
  Administered 2024-01-06 – 2024-01-09 (×4): 1 mg via INTRAVENOUS
  Filled 2024-01-06 (×3): qty 1

## 2024-01-06 MED ORDER — ONDANSETRON HCL 4 MG/2ML IJ SOLN
4.0000 mg | Freq: Once | INTRAMUSCULAR | Status: AC
  Administered 2024-01-06: 4 mg via INTRAVENOUS
  Filled 2024-01-06: qty 2

## 2024-01-06 MED ORDER — MELATONIN 5 MG PO TABS
5.0000 mg | ORAL_TABLET | Freq: Once | ORAL | Status: AC
  Administered 2024-01-06: 5 mg via ORAL
  Filled 2024-01-06: qty 1

## 2024-01-06 MED ORDER — HYDROMORPHONE HCL 1 MG/ML IJ SOLN
1.0000 mg | Freq: Once | INTRAMUSCULAR | Status: DC
  Filled 2024-01-06: qty 1

## 2024-01-06 MED ORDER — ONDANSETRON HCL 4 MG PO TABS: 4.0000 mg | ORAL_TABLET | Freq: Four times a day (QID) | ORAL | Status: DC | PRN

## 2024-01-06 MED ORDER — CARBIDOPA-LEVODOPA 25-100 MG PO TABS
1.0000 | ORAL_TABLET | Freq: Four times a day (QID) | ORAL | Status: DC
  Administered 2024-01-06 – 2024-01-09 (×14): 1 via ORAL
  Filled 2024-01-06 (×14): qty 1

## 2024-01-06 MED ORDER — MORPHINE SULFATE 15 MG PO TABS
15.0000 mg | ORAL_TABLET | Freq: Three times a day (TID) | ORAL | Status: DC | PRN
  Administered 2024-01-06 – 2024-01-07 (×2): 15 mg via ORAL
  Filled 2024-01-06 (×2): qty 1

## 2024-01-06 MED ORDER — ACETAMINOPHEN 650 MG RE SUPP: 650.0000 mg | Freq: Four times a day (QID) | RECTAL | Status: DC | PRN

## 2024-01-06 MED ORDER — AMLODIPINE BESYLATE 5 MG PO TABS
5.0000 mg | ORAL_TABLET | Freq: Every day | ORAL | Status: DC
  Administered 2024-01-07 – 2024-01-10 (×4): 5 mg via ORAL
  Filled 2024-01-06 (×4): qty 1

## 2024-01-06 MED ORDER — SODIUM CHLORIDE 0.9 % IV SOLN
500.0000 mg | Freq: Once | INTRAVENOUS | Status: AC
  Administered 2024-01-06: 500 mg via INTRAVENOUS
  Filled 2024-01-06: qty 5

## 2024-01-06 MED ORDER — FENTANYL 50 MCG/HR TD PT72
1.0000 | MEDICATED_PATCH | TRANSDERMAL | Status: DC
  Administered 2024-01-07: 1 via TRANSDERMAL
  Filled 2024-01-06: qty 1

## 2024-01-06 MED ORDER — SODIUM CHLORIDE 0.9 % IV SOLN
1.0000 g | INTRAVENOUS | Status: DC
  Administered 2024-01-07 – 2024-01-08 (×2): 1 g via INTRAVENOUS
  Filled 2024-01-06 (×2): qty 10

## 2024-01-06 MED ORDER — ONDANSETRON HCL 4 MG/2ML IJ SOLN: 4.0000 mg | Freq: Four times a day (QID) | INTRAMUSCULAR | Status: DC | PRN

## 2024-01-06 MED ORDER — SODIUM CHLORIDE 0.9 % IV SOLN
500.0000 mg | INTRAVENOUS | Status: DC
  Filled 2024-01-06: qty 5

## 2024-01-06 MED ORDER — SODIUM CHLORIDE 0.9 % IV SOLN
INTRAVENOUS | Status: DC
Start: 2024-01-06 — End: 2024-01-10

## 2024-01-06 MED ORDER — IOHEXOL 350 MG/ML SOLN
75.0000 mL | Freq: Once | INTRAVENOUS | Status: AC | PRN
  Administered 2024-01-06: 75 mL via INTRAVENOUS

## 2024-01-06 MED ORDER — SODIUM CHLORIDE 0.9 % IV SOLN
1.0000 g | Freq: Once | INTRAVENOUS | Status: AC
  Administered 2024-01-06: 1 g via INTRAVENOUS
  Filled 2024-01-06: qty 10

## 2024-01-06 NOTE — Progress Notes (Signed)
 [facility] Frederick Ponce dayshift admitting team.  The patient declined to be admitted because of a long wait in the ED for a bed assignment.  He wants to go home as his husband is a engineer, civil (consulting) and he thinks that he will be better there.  I can understand his frustration, but I told him that he would have to be discharged AMA.  I will leave the admission orders in and the incomplete H&P in case that the patient changes his mind, but another one of our team members will have to finish the admission as I have being assigned to other cases.  I have notified the nursing and medical staff.  [name], MD.

## 2024-01-06 NOTE — Plan of Care (Signed)
  Problem: Education: Goal: Knowledge of General Education information will improve Description: Including pain rating scale, medication(s)/side effects and non-pharmacologic comfort measures Outcome: Progressing   Problem: Clinical Measurements: Goal: Ability to maintain clinical measurements within normal limits will improve Outcome: Progressing Goal: Will remain free from infection Outcome: Progressing Goal: Diagnostic test results will improve Outcome: Progressing Goal: Respiratory complications will improve Outcome: Progressing Goal: Cardiovascular complication will be avoided Outcome: Progressing   Problem: Activity: Goal: Risk for activity intolerance will decrease Outcome: Progressing   Problem: Elimination: Goal: Will not experience complications related to bowel motility Outcome: Progressing   Problem: Pain Management: Goal: General experience of comfort will improve Outcome: Progressing   Problem: Skin Integrity: Goal: Risk for impaired skin integrity will decrease Outcome: Progressing   Problem: Activity: Goal: Ability to tolerate increased activity will improve Outcome: Progressing

## 2024-01-06 NOTE — ED Notes (Signed)
Vascular in room  

## 2024-01-06 NOTE — H&P (Signed)
 History and Physical    Patient: Frederick Ponce FMW:[id number] DOB: 1946/01/26 DOA: 01/06/2024 DOS: the patient was seen and examined on 01/06/2024 PCP: Georgina Speaks, FNP  Patient coming from: Home  Chief Complaint:  Chief Complaint  Patient presents with   Generalized Body Aches   HPI: Frederick Ponce is a 78 y.o. male with medical history significant of osteoarthritis, depression, prostate cancer, GERD, hyperlipidemia, Parkinson's disease who presented to the emergency department with complaints of bodyaches for the last 4 days.  He has recently started radiation therapy for metastatic disease. He states he's progressively gotten worse and had trouble with his ADLs. Has chronic pain and states he's gotten extremely weak. Denies any CP or SOB. States his mouth has been very dry since taking Pluvicto  as well.   On Arrival to the ED he had a Temp of 98.5 F, pulse 96, respirations 16, BP 129/61 mmHg O2 sat 100% on room air.  Patient received azithromycin  500 mg IVPB, ceftriaxone  1 g IVPB, hydromorphone  1 mg IVP x 2, ondansetron  4 mg IVP and 1000 mL of normal saline bolus.  Lab work: Urine analysis, lactic acid x 2; coronavirus, influenza and RSV PCR were normal/negative.  CBC is her white count 5.9, hemoglobin 8.5 g/dL with an MCV of 892.5 fL and platelets 266.  D-dimer was 3.14.  CMP showed a sodium 132 mmol/L, the rest of the electrolytes and renal function were normal after calcium  correction.  Glucose 126 mg/dL, total protein 6.3 and albumin 3.0 g/dL, the rest of the LFTs were normal.  Imaging: Portable 1 view chest radiograph showed increased patchy and confluent left lung base opacity and an area of previously treated pulmonary metastatic disease.  The appearance resembles more like pneumonia and then metastases progression.  No evident pleural effusion.  Follow-up axillary recommended.  CT angiograph chest with no PE.  There was patchy consolidation, peribronchovascular nodularity,  groundglass density and interlobular septal thickening in the left lower lobe.  Pulmonary metastasis in this area appears larger, although it is difficult to delineate given superimposed parenchymal process.  Findings favored to reflect disease progression with superimposed pneumonia and/or radiation pneumonitis.  Markedly progressive mediastinal and left hilar lymphadenopathy consistent with worsening metastatic disease.  13 x 7 mm irregular spiculated nodule in the anterior left upper lobe, previously 8 x 5 mm on prior PET scan and 9 x 7 mm on prior chest CT.  Unchanged treated osseous metastasis.  Aortic atherosclerosis.   Review of Systems: As mentioned in the history of present illness. All other systems reviewed and are negative. Past Medical History:  Diagnosis Date   Arthritis    Depression    Family history of melanoma    Family history of prostate cancer    GERD (gastroesophageal reflux disease)    Hyperlipidemia    Hypertension    Parkinson disease (HCC)    Prostate cancer (HCC)    History reviewed. No pertinent surgical history. Social History:  reports that he has never smoked. He has never used smokeless tobacco. He reports current alcohol  use of about 15.0 standard drinks of alcohol  per week. He reports that he does not use drugs.  Allergies  Allergen Reactions   Gabapentin  Other (See Comments)    Restless legs/confusion; patient denies symptoms and ask that this be removed.   Celebrex  [Celecoxib ] Rash   Family History  Problem Relation Age of Onset   Kidney disease Mother    Colon cancer Mother    Heart disease Father  Tremor Father    Parkinson's disease Father    Prostate cancer Father    Melanoma Sister    Parkinson's disease Sister    Melanoma Sister        melanoma x2   Prostate cancer Brother    Parkinson's disease Paternal Grandmother    Lung cancer Paternal Grandfather    Parkinson's disease Paternal Aunt    Lung cancer Paternal Aunt    Prior to  Admission medications   Medication Sig Start Date End Date Taking? Authorizing Provider  acetaminophen  (TYLENOL ) 500 MG tablet Take 1,000 mg by mouth every 6 (six) hours as needed.    [provider]  amLODipine  (NORVASC ) 5 MG tablet Take 1 tablet (5 mg total) by mouth daily. 08/09/23   Georgina Speaks, FNP  atorvastatin  (LIPITOR) 40 MG tablet TAKE 1 TABLET BY MOUTH EVERY MORNING 10/29/23   Georgina Speaks, FNP  calcium -vitamin D (OSCAL WITH D) 500-5 MG-MCG tablet Take 1 tablet by mouth 2 (two) times daily. 03/13/22   Shadad, Firas N, MD  carbidopa -levodopa  (SINEMET ) 25-100 MG tablet Take 1 tablet by mouth 4 (four) times daily. 09/05/23   Gayland Lauraine PARAS, NP  COENZYME Q10 PO Take 1 capsule by mouth every morning.    [provider]  fentaNYL  (DURAGESIC ) 50 MCG/HR Place 1 patch onto the skin every other day. 01/01/24   Pickenpack-Cousar, Athena N, NP  ibuprofen  (ADVIL ) 200 MG tablet Take 200 mg by mouth every 6 (six) hours as needed.    [provider]  KRILL OIL PO Take 1 capsule by mouth every morning.    [provider]  morphine  (MSIR) 15 MG tablet Take 1 tablet (15 mg total) by mouth every 8 (eight) hours. 11/07/23   Pickenpack-Cousar, Athena N, NP  multivitamin-lutein (OCUVITE-LUTEIN) CAPS capsule Take 1 capsule by mouth every morning.    [provider]  vitamin B-12 (CYANOCOBALAMIN) 1000 MCG tablet Take 1,000 mcg by mouth every morning.    [provider]   Physical Exam: Vitals:   01/06/24 1130 01/06/24 1314 01/06/24 1530 01/06/24 1720  BP: 112/64  (!) 121/58 (!) 147/74  Pulse: 92  99 (!) 103  Resp: 16  17 20   Temp:  98.2 F (36.8 C)  99.5 F (37.5 C)  TempSrc:  Oral  Oral  SpO2: 90%  90% 95%  Weight:      Height:       Examination: Physical Exam:  Constitutional: WN/WD overweight Chronically ill appearing Caucasian male in NAD but appears fatigued Respiratory: Diminished to auscultation bilaterally, no wheezing, rales, rhonchi or  crackles. Normal respiratory effort and patient is not tachypenic. No accessory muscle use. Unlabored breathing  Cardiovascular: RRR, no murmurs / rubs / gallops. S1 and S2 auscultated. 1+ LE edema Abdomen: Soft, non-tender, Distended 2/2 body habitus. Bowel sounds positive.  GU: Deferred. Musculoskeletal: No clubbing / cyanosis of digits/nails. No joint deformity upper and lower extremities.  Skin: Has LE warmth and erythema consistent with Venous Stasis  Neurologic: CN 2-12 grossly intact with no focal deficits. Romberg sign and cerebellar reflexes not assessed.  Psychiatric: Normal judgment and insight. Alert and oriented x 3. Normal mood and appropriate affect.   Data Reviewed:  Recent Results (from the past 2160 hours)  Prostate-Specific AG, Serum     Status: None   Collection Time: 10/22/23  3:03 PM  Result Value Ref Range   Prostate Specific Ag, Serum 3.6 0.0 - 4.0 ng/mL    Comment: (NOTE) Roche ECLIA methodology.  According to the American Urological Association, Serum PSA should decrease and remain at undetectable levels after radical prostatectomy. The AUA defines biochemical recurrence as an initial PSA value 0.2 ng/mL or greater followed by a subsequent confirmatory PSA value 0.2 ng/mL or greater. Values obtained with different assay methods or kits cannot be used interchangeably. Results cannot be interpreted as absolute evidence of the presence or absence of malignant disease. Performed At: [facility] [address] [place], [place] [id number] Jennette Shorter MD Ey:[phone]   CMP (Cancer Center only)     Status: Abnormal   Collection Time: 10/22/23  3:03 PM  Result Value Ref Range   Sodium 138 135 - 145 mmol/L   Potassium 4.4 3.5 - 5.1 mmol/L   Chloride 107 98 - 111 mmol/L   CO2 25 22 - 32 mmol/L   Glucose, Bld 116 (H) 70 - 99 mg/dL    Comment: Glucose reference range applies only to samples taken after fasting for at least 8 hours.   BUN 21 8 - 23 mg/dL    Creatinine 8.96 9.38 - 1.24 mg/dL   Calcium  9.6 8.9 - 10.3 mg/dL   Total Protein 7.0 6.5 - 8.1 g/dL   Albumin 4.5 3.5 - 5.0 g/dL   AST 18 15 - 41 U/L   ALT 5 0 - 44 U/L   Alkaline Phosphatase 55 38 - 126 U/L   Total Bilirubin 0.9 0.3 - 1.2 mg/dL   GFR, Estimated >39 >39 mL/min    Comment: (NOTE) Calculated using the CKD-EPI Creatinine Equation (2021)    Anion gap 6 5 - 15    Comment: Performed at [facility], [address] [place], [place] [address]  CBC with Differential (Cancer Center Only)     Status: Abnormal   Collection Time: 10/22/23  3:03 PM  Result Value Ref Range   WBC Count 3.7 (L) 4.0 - 10.5 K/uL   RBC 2.97 (L) 4.22 - 5.81 MIL/uL   Hemoglobin 10.7 (L) 13.0 - 17.0 g/dL   HCT 67.8 (L) 60.9 - 47.9 %   MCV 108.1 (H) 80.0 - 100.0 fL   MCH 36.0 (H) 26.0 - 34.0 pg   MCHC 33.3 30.0 - 36.0 g/dL   RDW 86.9 88.4 - 84.4 %   Platelet Count 158 150 - 400 K/uL   nRBC 0.0 0.0 - 0.2 %   Neutrophils Relative % 57 %   Neutro Abs 2.1 1.7 - 7.7 K/uL   Lymphocytes Relative 31 %   Lymphs Abs 1.1 0.7 - 4.0 K/uL   Monocytes Relative 9 %   Monocytes Absolute 0.3 0.1 - 1.0 K/uL   Eosinophils Relative 2 %   Eosinophils Absolute 0.1 0.0 - 0.5 K/uL   Basophils Relative 1 %   Basophils Absolute 0.0 0.0 - 0.1 K/uL   Immature Granulocytes 0 %   Abs Immature Granulocytes 0.01 0.00 - 0.07 K/uL    Comment: Performed at [facility], [address] [place], [place] [address]  CBC with Differential (Cancer Center Only)     Status: Abnormal   Collection Time: 11/15/23  2:12 PM  Result Value Ref Range   WBC Count 3.0 (L) 4.0 - 10.5 K/uL   RBC 2.94 (L) 4.22 - 5.81 MIL/uL   Hemoglobin 10.5 (L) 13.0 - 17.0 g/dL   HCT 68.0 (L) 60.9 - 47.9 %   MCV 108.5 (H) 80.0 - 100.0 fL   MCH 35.7 (H) 26.0 - 34.0 pg  MCHC 32.9 30.0 - 36.0 g/dL   RDW 87.0 88.4 - 84.4 %   Platelet Count 161 150 - 400 K/uL   nRBC 0.0 0.0 - 0.2 %   Neutrophils Relative % 52  %   Neutro Abs 1.6 (L) 1.7 - 7.7 K/uL   Lymphocytes Relative 32 %   Lymphs Abs 1.0 0.7 - 4.0 K/uL   Monocytes Relative 11 %   Monocytes Absolute 0.3 0.1 - 1.0 K/uL   Eosinophils Relative 4 %   Eosinophils Absolute 0.1 0.0 - 0.5 K/uL   Basophils Relative 1 %   Basophils Absolute 0.0 0.0 - 0.1 K/uL   Immature Granulocytes 0 %   Abs Immature Granulocytes 0.00 0.00 - 0.07 K/uL    Comment: Performed at [facility], [address], [place], [place] [address]  CMP (Cancer Center only)     Status: Abnormal   Collection Time: 11/15/23  2:12 PM  Result Value Ref Range   Sodium 138 135 - 145 mmol/L   Potassium 4.4 3.5 - 5.1 mmol/L   Chloride 106 98 - 111 mmol/L   CO2 28 22 - 32 mmol/L   Glucose, Bld 119 (H) 70 - 99 mg/dL    Comment: Glucose reference range applies only to samples taken after fasting for at least 8 hours.   BUN 23 8 - 23 mg/dL   Creatinine 8.79 9.38 - 1.24 mg/dL   Calcium  9.9 8.9 - 10.3 mg/dL   Total Protein 7.1 6.5 - 8.1 g/dL   Albumin 4.5 3.5 - 5.0 g/dL   AST 21 15 - 41 U/L   ALT <5 0 - 44 U/L    Comment: REPEATED TO VERIFY   Alkaline Phosphatase 60 38 - 126 U/L   Total Bilirubin 1.0 <1.2 mg/dL   GFR, Estimated >39 >39 mL/min    Comment: (NOTE) Calculated using the CKD-EPI Creatinine Equation (2021)    Anion gap 4 (L) 5 - 15    Comment: Performed at [facility], [address], [place], [place] [address]  Prostate-Specific AG, Serum     Status: Abnormal   Collection Time: 11/15/23  2:12 PM  Result Value Ref Range   Prostate Specific Ag, Serum 8.8 (H) 0.0 - 4.0 ng/mL    Comment: (NOTE) Roche ECLIA methodology. According to the American Urological Association, Serum PSA should decrease and remain at undetectable levels after radical prostatectomy. The AUA defines biochemical recurrence as an initial PSA value 0.2 ng/mL or greater followed by a subsequent confirmatory PSA value 0.2 ng/mL or greater. Values  obtained with different assay methods or kits cannot be used interchangeably. Results cannot be interpreted as absolute evidence of the presence or absence of malignant disease. Performed At: [facility] [address] [place], [place] [id number] Jennette Shorter MD Ey:[phone]   Testosterone      Status: Abnormal   Collection Time: 11/15/23  2:12 PM  Result Value Ref Range   Testosterone  <3 (L) 264 - 916 ng/dL    Comment: (NOTE) Adult male reference interval is based on a population of healthy nonobese males (BMI <30) between 73 and 42 years old. Travison, et.al. JCEM [phone]. PMID: [id number]. Performed At: [facility] [address] [place], [place] [id number] Jennette Shorter MD Ey:[phone]   Prostate-Specific AG, Serum     Status: Abnormal   Collection Time: 11/25/23  3:02 PM  Result Value Ref Range   Prostate Specific Ag, Serum 11.2 (H) 0.0 - 4.0 ng/mL  Comment: (NOTE) Roche ECLIA methodology. According to the American Urological Association, Serum PSA should decrease and remain at undetectable levels after radical prostatectomy. The AUA defines biochemical recurrence as an initial PSA value 0.2 ng/mL or greater followed by a subsequent confirmatory PSA value 0.2 ng/mL or greater. Values obtained with different assay methods or kits cannot be used interchangeably. Results cannot be interpreted as absolute evidence of the presence or absence of malignant disease. Performed At: [facility] [address] [place], [place] [id number] Jennette Shorter MD Ey:[phone]   CMP (Cancer Center only)     Status: Abnormal   Collection Time: 11/25/23  3:02 PM  Result Value Ref Range   Sodium 138 135 - 145 mmol/L   Potassium 4.2 3.5 - 5.1 mmol/L   Chloride 105 98 - 111 mmol/L   CO2 26 22 - 32 mmol/L   Glucose, Bld 110 (H) 70 - 99 mg/dL    Comment: Glucose reference range applies only to samples taken after fasting for at least 8 hours.   BUN 23  8 - 23 mg/dL   Creatinine 8.93 9.38 - 1.24 mg/dL   Calcium  9.6 8.9 - 10.3 mg/dL   Total Protein 6.7 6.5 - 8.1 g/dL   Albumin 4.3 3.5 - 5.0 g/dL   AST 16 15 - 41 U/L   ALT <5 0 - 44 U/L    Comment: REPEATED TO VERIFY   Alkaline Phosphatase 58 38 - 126 U/L   Total Bilirubin 0.7 <1.2 mg/dL   GFR, Estimated >39 >39 mL/min    Comment: (NOTE) Calculated using the CKD-EPI Creatinine Equation (2021)    Anion gap 7 5 - 15    Comment: Performed at [facility], [address] [place], [place] [address]  CBC with Differential (Cancer Center Only)     Status: Abnormal   Collection Time: 11/25/23  3:02 PM  Result Value Ref Range   WBC Count 3.5 (L) 4.0 - 10.5 K/uL   RBC 2.82 (L) 4.22 - 5.81 MIL/uL   Hemoglobin 10.2 (L) 13.0 - 17.0 g/dL   HCT 69.4 (L) 60.9 - 47.9 %   MCV 108.2 (H) 80.0 - 100.0 fL   MCH 36.2 (H) 26.0 - 34.0 pg   MCHC 33.4 30.0 - 36.0 g/dL   RDW 86.7 88.4 - 84.4 %   Platelet Count 153 150 - 400 K/uL   nRBC 0.0 0.0 - 0.2 %   Neutrophils Relative % 57 %   Neutro Abs 2.0 1.7 - 7.7 K/uL   Lymphocytes Relative 29 %   Lymphs Abs 1.0 0.7 - 4.0 K/uL   Monocytes Relative 10 %   Monocytes Absolute 0.3 0.1 - 1.0 K/uL   Eosinophils Relative 3 %   Eosinophils Absolute 0.1 0.0 - 0.5 K/uL   Basophils Relative 1 %   Basophils Absolute 0.0 0.0 - 0.1 K/uL   Immature Granulocytes 0 %   Abs Immature Granulocytes 0.00 0.00 - 0.07 K/uL    Comment: Performed at [facility], [address], [place], [place] [address]  Prostate-Specific AG, Serum     Status: Abnormal   Collection Time: 12/02/23  3:03 PM  Result Value Ref Range   Prostate Specific Ag, Serum 14.3 (H) 0.0 - 4.0 ng/mL    Comment: (NOTE) Roche ECLIA methodology. According to the American Urological Association, Serum PSA should decrease and remain at undetectable levels after radical prostatectomy. The AUA defines biochemical recurrence as an initial PSA value 0.2 ng/mL  or  greater followed by a subsequent confirmatory PSA value 0.2 ng/mL or greater. Values obtained with different assay methods or kits cannot be used interchangeably. Results cannot be interpreted as absolute evidence of the presence or absence of malignant disease. Performed At: [facility] [address] [place], [place] [id number] Jennette Shorter MD Ey:[phone]   CMP (Cancer Center only)     Status: Abnormal   Collection Time: 12/02/23  3:03 PM  Result Value Ref Range   Sodium 139 135 - 145 mmol/L   Potassium 4.4 3.5 - 5.1 mmol/L   Chloride 106 98 - 111 mmol/L   CO2 27 22 - 32 mmol/L   Glucose, Bld 105 (H) 70 - 99 mg/dL    Comment: Glucose reference range applies only to samples taken after fasting for at least 8 hours.   BUN 23 8 - 23 mg/dL   Creatinine 9.05 9.38 - 1.24 mg/dL   Calcium  9.4 8.9 - 10.3 mg/dL   Total Protein 6.8 6.5 - 8.1 g/dL   Albumin 4.3 3.5 - 5.0 g/dL   AST 13 (L) 15 - 41 U/L   ALT <5 0 - 44 U/L    Comment: REPEATED TO VERIFY   Alkaline Phosphatase 57 38 - 126 U/L   Total Bilirubin 0.7 <1.2 mg/dL   GFR, Estimated >39 >39 mL/min    Comment: (NOTE) Calculated using the CKD-EPI Creatinine Equation (2021)    Anion gap 6 5 - 15    Comment: Performed at [facility], [address] [place], [place] [address]  CBC with Differential (Cancer Center Only)     Status: Abnormal   Collection Time: 12/02/23  3:03 PM  Result Value Ref Range   WBC Count 3.4 (L) 4.0 - 10.5 K/uL   RBC 2.77 (L) 4.22 - 5.81 MIL/uL   Hemoglobin 10.1 (L) 13.0 - 17.0 g/dL   HCT 69.9 (L) 60.9 - 47.9 %   MCV 108.3 (H) 80.0 - 100.0 fL   MCH 36.5 (H) 26.0 - 34.0 pg   MCHC 33.7 30.0 - 36.0 g/dL   RDW 86.5 88.4 - 84.4 %   Platelet Count 180 150 - 400 K/uL   nRBC 0.0 0.0 - 0.2 %   Neutrophils Relative % 49 %   Neutro Abs 1.7 1.7 - 7.7 K/uL   Lymphocytes Relative 35 %   Lymphs Abs 1.2 0.7 - 4.0 K/uL   Monocytes Relative 12 %   Monocytes Absolute 0.4 0.1 - 1.0  K/uL   Eosinophils Relative 3 %   Eosinophils Absolute 0.1 0.0 - 0.5 K/uL   Basophils Relative 1 %   Basophils Absolute 0.0 0.0 - 0.1 K/uL   Immature Granulocytes 0 %   Abs Immature Granulocytes 0.01 0.00 - 0.07 K/uL    Comment: Performed at [facility], [address], [place], [place] [address]  Prostate-Specific AG, Serum     Status: Abnormal   Collection Time: 12/10/23  3:23 PM  Result Value Ref Range   Prostate Specific Ag, Serum 20.0 (H) 0.0 - 4.0 ng/mL    Comment: (NOTE) Roche ECLIA methodology. According to the American Urological Association, Serum PSA should decrease and remain at undetectable levels after radical prostatectomy. The AUA defines biochemical recurrence as an initial PSA value 0.2 ng/mL or greater followed by a subsequent confirmatory PSA value 0.2 ng/mL or greater. Values obtained with different assay methods or kits cannot be used interchangeably. Results cannot be interpreted as absolute evidence of  the presence or absence of malignant disease. Performed At: [facility] [address] [place], [place] [id number] Jennette Shorter MD Ey:[phone]   CMP (Cancer Center only)     Status: Abnormal   Collection Time: 12/10/23  3:23 PM  Result Value Ref Range   Sodium 137 135 - 145 mmol/L   Potassium 4.4 3.5 - 5.1 mmol/L   Chloride 106 98 - 111 mmol/L   CO2 26 22 - 32 mmol/L   Glucose, Bld 111 (H) 70 - 99 mg/dL    Comment: Glucose reference range applies only to samples taken after fasting for at least 8 hours.   BUN 18 8 - 23 mg/dL   Creatinine 9.18 9.38 - 1.24 mg/dL   Calcium  9.6 8.9 - 10.3 mg/dL   Total Protein 6.5 6.5 - 8.1 g/dL   Albumin 4.2 3.5 - 5.0 g/dL   AST 14 (L) 15 - 41 U/L   ALT <5 0 - 44 U/L    Comment: REPEATED TO VERIFY   Alkaline Phosphatase 61 38 - 126 U/L   Total Bilirubin 0.7 <1.2 mg/dL   GFR, Estimated >39 >39 mL/min    Comment: (NOTE) Calculated using the CKD-EPI Creatinine Equation (2021)     Anion gap 5 5 - 15    Comment: Performed at [facility], [address], [place], [place] [address]  CBC with Differential (Cancer Center Only)     Status: Abnormal   Collection Time: 12/10/23  3:23 PM  Result Value Ref Range   WBC Count 3.6 (L) 4.0 - 10.5 K/uL   RBC 2.71 (L) 4.22 - 5.81 MIL/uL   Hemoglobin 9.7 (L) 13.0 - 17.0 g/dL   HCT 70.6 (L) 60.9 - 47.9 %   MCV 108.1 (H) 80.0 - 100.0 fL   MCH 35.8 (H) 26.0 - 34.0 pg   MCHC 33.1 30.0 - 36.0 g/dL   RDW 86.7 88.4 - 84.4 %   Platelet Count 197 150 - 400 K/uL   nRBC 0.0 0.0 - 0.2 %   Neutrophils Relative % 58 %   Neutro Abs 2.1 1.7 - 7.7 K/uL   Lymphocytes Relative 28 %   Lymphs Abs 1.0 0.7 - 4.0 K/uL   Monocytes Relative 11 %   Monocytes Absolute 0.4 0.1 - 1.0 K/uL   Eosinophils Relative 3 %   Eosinophils Absolute 0.1 0.0 - 0.5 K/uL   Basophils Relative 0 %   Basophils Absolute 0.0 0.0 - 0.1 K/uL   Immature Granulocytes 0 %   Abs Immature Granulocytes 0.00 0.00 - 0.07 K/uL    Comment: Performed at [facility], [address], [place], [place] [address]  Prostate-Specific AG, Serum     Status: Abnormal   Collection Time: 12/16/23  2:55 PM  Result Value Ref Range   Prostate Specific Ag, Serum 26.9 (H) 0.0 - 4.0 ng/mL    Comment: (NOTE) Roche ECLIA methodology. According to the American Urological Association, Serum PSA should decrease and remain at undetectable levels after radical prostatectomy. The AUA defines biochemical recurrence as an initial PSA value 0.2 ng/mL or greater followed by a subsequent confirmatory PSA value 0.2 ng/mL or greater. Values obtained with different assay methods or kits cannot be used interchangeably. Results cannot be interpreted as absolute evidence of the presence or absence of malignant disease. Performed At: [facility] [address] [place], [place] [id number] Jennette Shorter MD Ey:[phone]   CMP [facility] only)      Status:  Abnormal   Collection Time: 12/16/23  2:55 PM  Result Value Ref Range   Sodium 138 135 - 145 mmol/L   Potassium 4.1 3.5 - 5.1 mmol/L   Chloride 106 98 - 111 mmol/L   CO2 27 22 - 32 mmol/L   Glucose, Bld 104 (H) 70 - 99 mg/dL    Comment: Glucose reference range applies only to samples taken after fasting for at least 8 hours.   BUN 17 8 - 23 mg/dL   Creatinine 9.20 9.38 - 1.24 mg/dL   Calcium  9.1 8.9 - 10.3 mg/dL   Total Protein 6.4 (L) 6.5 - 8.1 g/dL   Albumin 4.1 3.5 - 5.0 g/dL   AST 12 (L) 15 - 41 U/L   ALT <5 0 - 44 U/L    Comment: REPEATED TO VERIFY   Alkaline Phosphatase 60 38 - 126 U/L   Total Bilirubin 0.6 <1.2 mg/dL   GFR, Estimated >39 >39 mL/min    Comment: (NOTE) Calculated using the CKD-EPI Creatinine Equation (2021)    Anion gap 5 5 - 15    Comment: Performed at [facility], [address], [place], [place] [address]  CBC with Differential (Cancer Center Only)     Status: Abnormal   Collection Time: 12/16/23  2:55 PM  Result Value Ref Range   WBC Count 3.7 (L) 4.0 - 10.5 K/uL   RBC 2.68 (L) 4.22 - 5.81 MIL/uL   Hemoglobin 9.5 (L) 13.0 - 17.0 g/dL   HCT 71.1 (L) 60.9 - 47.9 %   MCV 107.5 (H) 80.0 - 100.0 fL   MCH 35.4 (H) 26.0 - 34.0 pg   MCHC 33.0 30.0 - 36.0 g/dL   RDW 86.7 88.4 - 84.4 %   Platelet Count 221 150 - 400 K/uL   nRBC 0.0 0.0 - 0.2 %   Neutrophils Relative % 57 %   Neutro Abs 2.1 1.7 - 7.7 K/uL   Lymphocytes Relative 28 %   Lymphs Abs 1.0 0.7 - 4.0 K/uL   Monocytes Relative 11 %   Monocytes Absolute 0.4 0.1 - 1.0 K/uL   Eosinophils Relative 3 %   Eosinophils Absolute 0.1 0.0 - 0.5 K/uL   Basophils Relative 1 %   Basophils Absolute 0.0 0.0 - 0.1 K/uL   Immature Granulocytes 0 %   Abs Immature Granulocytes 0.01 0.00 - 0.07 K/uL    Comment: Performed at [facility], [address], [place], [place] [address]  Prostate-Specific AG, Serum     Status: Abnormal   Collection Time:  12/26/23  2:59 PM  Result Value Ref Range   Prostate Specific Ag, Serum 39.2 (H) 0.0 - 4.0 ng/mL    Comment: (NOTE) Roche ECLIA methodology. According to the American Urological Association, Serum PSA should decrease and remain at undetectable levels after radical prostatectomy. The AUA defines biochemical recurrence as an initial PSA value 0.2 ng/mL or greater followed by a subsequent confirmatory PSA value 0.2 ng/mL or greater. Values obtained with different assay methods or kits cannot be used interchangeably. Results cannot be interpreted as absolute evidence of the presence or absence of malignant disease. Performed At: [facility] [address] [place], [place] [id number] Jennette Shorter MD Ey:[phone]   CMP (Cancer Center only)     Status: Abnormal   Collection Time: 12/26/23  2:59 PM  Result Value Ref Range   Sodium 140 135 - 145 mmol/L   Potassium 4.6 3.5 - 5.1 mmol/L   Chloride  106 98 - 111 mmol/L   CO2 27 22 - 32 mmol/L   Glucose, Bld 108 (H) 70 - 99 mg/dL    Comment: Glucose reference range applies only to samples taken after fasting for at least 8 hours.   BUN 18 8 - 23 mg/dL   Creatinine 8.88 9.38 - 1.24 mg/dL   Calcium  9.3 8.9 - 10.3 mg/dL   Total Protein 6.9 6.5 - 8.1 g/dL   Albumin 3.9 3.5 - 5.0 g/dL   AST 13 (L) 15 - 41 U/L   ALT <5 0 - 44 U/L    Comment: REPEATED TO VERIFY   Alkaline Phosphatase 61 38 - 126 U/L   Total Bilirubin 0.6 0.0 - 1.2 mg/dL   GFR, Estimated >39 >39 mL/min    Comment: (NOTE) Calculated using the CKD-EPI Creatinine Equation (2021)    Anion gap 7 5 - 15    Comment: Performed at [facility], [address], [place], [place] [address]  CBC with Differential (Cancer Center Only)     Status: Abnormal   Collection Time: 12/26/23  2:59 PM  Result Value Ref Range   WBC Count 4.9 4.0 - 10.5 K/uL   RBC 2.80 (L) 4.22 - 5.81 MIL/uL   Hemoglobin 9.8 (L) 13.0 - 17.0 g/dL   HCT 70.0 (L) 60.9 - 47.9 %    MCV 106.8 (H) 80.0 - 100.0 fL   MCH 35.0 (H) 26.0 - 34.0 pg   MCHC 32.8 30.0 - 36.0 g/dL   RDW 86.8 88.4 - 84.4 %   Platelet Count 270 150 - 400 K/uL   nRBC 0.0 0.0 - 0.2 %   Neutrophils Relative % 72 %   Neutro Abs 3.5 1.7 - 7.7 K/uL   Lymphocytes Relative 19 %   Lymphs Abs 0.9 0.7 - 4.0 K/uL   Monocytes Relative 7 %   Monocytes Absolute 0.4 0.1 - 1.0 K/uL   Eosinophils Relative 2 %   Eosinophils Absolute 0.1 0.0 - 0.5 K/uL   Basophils Relative 0 %   Basophils Absolute 0.0 0.0 - 0.1 K/uL   Immature Granulocytes 0 %   Abs Immature Granulocytes 0.01 0.00 - 0.07 K/uL    Comment: Performed at [facility], [address], [place], [place] [address]  Resp panel by RT-PCR (RSV, Flu A&B, Covid) Anterior Nasal Swab     Status: None   Collection Time: 01/06/24  8:59 AM   Specimen: Anterior Nasal Swab  Result Value Ref Range   SARS Coronavirus 2 by RT PCR NEGATIVE NEGATIVE    Comment: (NOTE) SARS-CoV-2 target nucleic acids are NOT DETECTED.  The SARS-CoV-2 RNA is generally detectable in upper respiratory specimens during the acute phase of infection. The lowest concentration of SARS-CoV-2 viral copies this assay can detect is 138 copies/mL. A negative result does not preclude SARS-Cov-2 infection and should not be used as the sole basis for treatment or other patient management decisions. A negative result may occur with  improper specimen collection/handling, submission of specimen other than nasopharyngeal swab, presence of viral mutation(s) within the areas targeted by this assay, and inadequate number of viral copies(<138 copies/mL). A negative result must be combined with clinical observations, patient history, and epidemiological information. The expected result is Negative.  Fact Sheet for Patients:  bloggercourse.com  Fact Sheet for Healthcare Providers:  seriousbroker.it  This test is no t yet  approved or cleared by the [place]  FDA and  has been authorized for  detection and/or diagnosis of SARS-CoV-2 by FDA under an Emergency Use Authorization (EUA). This EUA will remain  in effect (meaning this test can be used) for the duration of the COVID-19 declaration under Section 564(b)(1) of the Act, 21 U.S.C.section 360bbb-3(b)(1), unless the authorization is terminated  or revoked sooner.       Influenza A by PCR NEGATIVE NEGATIVE   Influenza B by PCR NEGATIVE NEGATIVE    Comment: (NOTE) The Xpert Xpress SARS-CoV-2/FLU/RSV plus assay is intended as an aid in the diagnosis of influenza from Nasopharyngeal swab specimens and should not be used as a sole basis for treatment. Nasal washings and aspirates are unacceptable for Xpert Xpress SARS-CoV-2/FLU/RSV testing.  Fact Sheet for Patients: bloggercourse.com  Fact Sheet for Healthcare Providers: seriousbroker.it  This test is not yet approved or cleared by the [place]  FDA and has been authorized for detection and/or diagnosis of SARS-CoV-2 by FDA under an Emergency Use Authorization (EUA). This EUA will remain in effect (meaning this test can be used) for the duration of the COVID-19 declaration under Section 564(b)(1) of the Act, 21 U.S.C. section 360bbb-3(b)(1), unless the authorization is terminated or revoked.     Resp Syncytial Virus by PCR NEGATIVE NEGATIVE    Comment: (NOTE) Fact Sheet for Patients: bloggercourse.com  Fact Sheet for Healthcare Providers: seriousbroker.it  This test is not yet approved or cleared by the [place]  FDA and has been authorized for detection and/or diagnosis of SARS-CoV-2 by FDA under an Emergency Use Authorization (EUA). This EUA will remain in effect (meaning this test can be used) for the duration of the COVID-19 declaration under Section 564(b)(1) of the Act, 21  U.S.C. section 360bbb-3(b)(1), unless the authorization is terminated or revoked.  Performed at [facility], [address], [place], [place] [address]   Comprehensive metabolic panel     Status: Abnormal   Collection Time: 01/06/24  8:59 AM  Result Value Ref Range   Sodium 132 (L) 135 - 145 mmol/L   Potassium 3.7 3.5 - 5.1 mmol/L   Chloride 102 98 - 111 mmol/L   CO2 22 22 - 32 mmol/L   Glucose, Bld 126 (H) 70 - 99 mg/dL    Comment: Glucose reference range applies only to samples taken after fasting for at least 8 hours.   BUN 18 8 - 23 mg/dL   Creatinine, Ser 9.24 0.61 - 1.24 mg/dL   Calcium  8.0 (L) 8.9 - 10.3 mg/dL   Total Protein 6.3 (L) 6.5 - 8.1 g/dL   Albumin 3.0 (L) 3.5 - 5.0 g/dL   AST 15 15 - 41 U/L   ALT 7 0 - 44 U/L   Alkaline Phosphatase 53 38 - 126 U/L   Total Bilirubin 0.6 0.0 - 1.2 mg/dL   GFR, Estimated >39 >39 mL/min    Comment: (NOTE) Calculated using the CKD-EPI Creatinine Equation (2021)    Anion gap 8 5 - 15    Comment: Performed at [facility], [address], [place], [place] [address]  CBC with Differential     Status: Abnormal   Collection Time: 01/06/24  8:59 AM  Result Value Ref Range   WBC 5.9 4.0 - 10.5 K/uL   RBC 2.43 (L) 4.22 - 5.81 MIL/uL   Hemoglobin 8.5 (L) 13.0 - 17.0 g/dL   HCT 73.8 (L) 60.9 - 47.9 %   MCV 107.4 (H) 80.0 - 100.0 fL   MCH 35.0 (H) 26.0 - 34.0 pg   MCHC 32.6  30.0 - 36.0 g/dL   RDW 86.4 88.4 - 84.4 %   Platelets 266 150 - 400 K/uL   nRBC 0.0 0.0 - 0.2 %   Neutrophils Relative % 82 %   Neutro Abs 4.9 1.7 - 7.7 K/uL   Lymphocytes Relative 8 %   Lymphs Abs 0.5 (L) 0.7 - 4.0 K/uL   Monocytes Relative 8 %   Monocytes Absolute 0.5 0.1 - 1.0 K/uL   Eosinophils Relative 1 %   Eosinophils Absolute 0.0 0.0 - 0.5 K/uL   Basophils Relative 0 %   Basophils Absolute 0.0 0.0 - 0.1 K/uL   Immature Granulocytes 1 %   Abs Immature Granulocytes 0.03 0.00 - 0.07 K/uL    Comment: Performed at  [facility], [address] [place], [place] [address]  D-dimer, quantitative     Status: Abnormal   Collection Time: 01/06/24  9:43 AM  Result Value Ref Range   D-Dimer, Quant 3.14 (H) 0.00 - 0.50 ug/mL-FEU    Comment: (NOTE) At the manufacturer cut-off value of 0.5 g/mL FEU, this assay has a negative predictive value of 95-100%.This assay is intended for use in conjunction with a clinical pretest probability (PTP) assessment model to exclude pulmonary embolism (PE) and deep venous thrombosis (DVT) in outpatients suspected of PE or DVT. Results should be correlated with clinical presentation. Performed at [facility], [address] [place], [place] [address]   Urinalysis, Routine w reflex microscopic -Urine, Clean Catch     Status: None   Collection Time: 01/06/24 10:58 AM  Result Value Ref Range   Color, Urine YELLOW YELLOW   APPearance CLEAR CLEAR   Specific Gravity, Urine 1.014 1.005 - 1.030   pH 6.0 5.0 - 8.0   Glucose, UA NEGATIVE NEGATIVE mg/dL   Hgb urine dipstick NEGATIVE NEGATIVE   Bilirubin Urine NEGATIVE NEGATIVE   Ketones, ur NEGATIVE NEGATIVE mg/dL   Protein, ur NEGATIVE NEGATIVE mg/dL   Nitrite NEGATIVE NEGATIVE   Leukocytes,Ua NEGATIVE NEGATIVE    Comment: Performed at [facility], [address] [place], [place] [address]  I-Stat Lactic Acid     Status: None   Collection Time: 01/06/24 11:04 AM  Result Value Ref Range   Lactic Acid, Venous 0.7 0.5 - 1.9 mmol/L  I-Stat Lactic Acid     Status: None   Collection Time: 01/06/24  2:28 PM  Result Value Ref Range   Lactic Acid, Venous 0.5 0.5 - 1.9 mmol/L   EKG: Vent. rate 91 BPM PR interval 154 ms QRS duration 111 ms QT/QTcB 360/443 ms P-R-T axes 52 21 28 Sinus rhythm Low voltage, precordial leads Abnormal R-wave progression, early transition  Assessment and Plan: No notes have been filed under this hospital service. Service:  Hospitalist  Generalized Weakness and Diffuse Body Aches likely in the setting of Pluvicto  and CAP -Admitted to Inpatient Telemetry -Check TSH in the AM and Blood Cx x2 -SpO2: 95 % -U/A unremarkable -Lactic Acid Trend: Recent Labs  Lab 01/06/24 1104 01/06/24 1428  LATICACIDVEN 0.7 0.5  -Start NS at 75 mL/hr; Given a 1 Liter Bolus -PT/OT to Further Evaluate and Treat   CAP -CTA shows possible superimposed pneumonia and/or radiation pneumonitis -Will continue empiric antibiotics with IV ceftriaxone  and azithromycin  -Respiratory status is not labored and will continue to monitor and add guaifenesin  1200 mL p.o. 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 Federico via Epic as a courtesy -Sees Dr. Malva [facility] and Had Treatment one of Pluvicto    Chronic Pain -C/w Fentanyl  Patch 50 mcg q72h and MSIR 15 mg q8hprn -C/w Hydromorphone  1 mg IV q4hprn Severe Pain  Elevated D-Dimer -D-Dimer was 3.14 -CTA PE Protocol done and showed No evidence of pulmonary embolism. Patchy consolidation, peribronchovascular nodularity, ground-glass density, and interlobular septal thickening in the left lower lobe. The pulmonary metastasis in this area appears larger, although it is difficult to delineate given superimposed parenchymal process. Findings are favored to reflect disease progression with superimposed pneumonia and/or radiation pneumonitis. Markedly progressive mediastinal and left hilar lymphadenopathy, consistent with worsening metastatic disease. 13 x 7 mm irregular spiculated nodule in the anterior left upper lobe, previously 8 x 5 mm on prior PET, and 9 x 7  mm on prior chest CT. There was no abnormal uptake in this area on the PET scan, but given other findings on today's study, is concerning for recurrent metastasis. Unchanged treated osseous metastatic disease.  Aortic Atherosclerosis -LE Venous Duplex done and showed No evidence of deep vein thrombosis seen in the lower extremities, bilaterally.  Hyponatremia -Na+ Trend: Recent Labs  Lab 12/10/23 1523 12/16/23 1455 12/26/23 1459 01/06/24 0859  NA 137 138 140 132*  -Given a 1 Liter bolus; Will start Maintenance IVF with NS at 75 mL/hr  Macrocytic Anemia -Hgb/Hct Trend: Recent Labs  Lab 12/10/23 1523 12/16/23 1455 12/26/23 1459 01/06/24 0859  HGB 9.7* 9.5* 9.8* 8.5*  HCT 29.3* 28.8* 29.9* 26.1*  MCV 108.1* 107.5* 106.8* 107.4*  -Check Anemia Panel in the AM -Continue to Monitor for S/Sx of Bleeding; No overt bleeding noted -Repeat CBC in the AM  Hypoalbuminemia -Patient's Albumin Trend: Recent Labs  Lab 12/10/23 1523 12/16/23 1455 12/26/23 1459 01/06/24 0859  ALBUMIN 4.2 4.1 3.9 3.0*  -Continue to Monitor and Trend and repeat CMP in the AM  Overweight -Complicates overall prognosis and care -Estimated body mass index is 26.51 kg/m as calculated from the following:   Height as of this encounter: 5' 8 (1.727 m).   Weight as of this encounter: 79.1 kg.  -Weight Loss and Dietary Counseling given  Advance Care Planning:   Code Status: Do not attempt resuscitation (DNR) PRE-ARREST INTERVENTIONS DESIRED   Consults: Will notify Medical Oncology as a courtesy  Family Communication: No family present at bedside   Severity of Illness: The appropriate patient status for this patient is INPATIENT. Inpatient status is judged to be reasonable and necessary in order to provide the required intensity of service to ensure the patient's safety. The patient's presenting symptoms, physical exam findings, and initial radiographic and laboratory data in the context of their chronic  comorbidities is felt to place them at high risk for further clinical deterioration. Furthermore, it is not anticipated that the patient will be medically stable for discharge from the hospital within 2 midnights of admission.   * I certify that at the point of admission it is my clinical judgment that the patient will require inpatient hospital care spanning beyond 2 midnights from the point of admission due to high intensity of service, high risk for further deterioration and high frequency of surveillance required.*  Author: Alejandro Marker, DO [facility] 01/06/2024 5:52 PM  For on call review [email or web address].  This document was prepared using Dragon voice recognition software and may contain some unintended transcription errors.

## 2024-01-06 NOTE — ED Triage Notes (Signed)
 Patient reports generalized body aches x 4 days. Recently started radiation for prostate cancer with metastasis. Patinet took prescribed home medications without relief.  Patient takes morphine PO and has a fentanyl patch.

## 2024-01-06 NOTE — ED Notes (Signed)
 MD in room

## 2024-01-06 NOTE — ED Provider Notes (Signed)
 [place] EMERGENCY DEPARTMENT AT [facility] Provider Note   CSN: [id number] Arrival date & time: 01/06/24  0818     History  Chief Complaint  Patient presents with   Generalized Body Aches    Frederick Ponce is a 78 y.o. male.  Pt is a 78 yo male with pmhx significant for parkinson's disease, hld, arthritis, depression, gerd, htn, and metastatic prostate cancer.  Pt had his first radiation tx on 1/9.  He had been getting chemo and last infusion was on 12/17.  Pt has been having a lot of trouble with pain.  He's been on a fentanyl  patch (50 mcg) and Morphine  ir (15 mg) which he's been taking without relief of pain.  He has also developed uri sx.  He has had a cough.  Both of his lower legs have become more swollen.  No known fevers.       Home Medications Prior to Admission medications   Medication Sig Start Date End Date Taking? Authorizing Provider  acetaminophen  (TYLENOL ) 500 MG tablet Take 1,000 mg by mouth every 6 (six) hours as needed.    [provider]  amLODipine  (NORVASC ) 5 MG tablet Take 1 tablet (5 mg total) by mouth daily. 08/09/23   Georgina Speaks, FNP  atorvastatin  (LIPITOR) 40 MG tablet TAKE 1 TABLET BY MOUTH EVERY MORNING 10/29/23   Georgina Speaks, FNP  calcium -vitamin D (OSCAL WITH D) 500-5 MG-MCG tablet Take 1 tablet by mouth 2 (two) times daily. 03/13/22   Shadad, Firas N, MD  carbidopa -levodopa  (SINEMET ) 25-100 MG tablet Take 1 tablet by mouth 4 (four) times daily. 09/05/23   Gayland Lauraine PARAS, NP  COENZYME Q10 PO Take 1 capsule by mouth every morning.    [provider]  fentaNYL  (DURAGESIC ) 50 MCG/HR Place 1 patch onto the skin every other day. 01/01/24   Pickenpack-Cousar, Athena N, NP  ibuprofen  (ADVIL ) 200 MG tablet Take 200 mg by mouth every 6 (six) hours as needed.    [provider]  KRILL OIL PO Take 1 capsule by mouth every morning.    [provider]  morphine  (MSIR) 15 MG tablet Take 1 tablet (15 mg total) by  mouth every 8 (eight) hours. 11/07/23   Pickenpack-Cousar, Athena N, NP  multivitamin-lutein (OCUVITE-LUTEIN) CAPS capsule Take 1 capsule by mouth every morning.    [provider]  vitamin B-12 (CYANOCOBALAMIN) 1000 MCG tablet Take 1,000 mcg by mouth every morning.    [provider]      Allergies    Gabapentin  and Celebrex  Aram.barrow ]    Review of Systems   Review of Systems  HENT:  Positive for congestion.   Respiratory:  Positive for cough and shortness of breath.   Cardiovascular:  Positive for leg swelling.  Musculoskeletal:        Diffuse pain  Neurological:  Positive for weakness.  All other systems reviewed and are negative.   Physical Exam Updated Vital Signs BP 112/64   Pulse 92   Temp 98.2 F (36.8 C) (Oral)   Resp 16   Ht 5' 8 (1.727 m)   Wt 79.1 kg   SpO2 90%   BMI 26.51 kg/m  Physical Exam Vitals and nursing note reviewed.  Constitutional:      Appearance: Normal appearance.  HENT:     Head: Normocephalic and atraumatic.     Right Ear: External ear normal.     Left Ear: External ear normal.     Nose: Congestion present.  Mouth/Throat:     Mouth: Mucous membranes are dry.     Pharynx: Oropharynx is clear.  Eyes:     Extraocular Movements: Extraocular movements intact.     Conjunctiva/sclera: Conjunctivae normal.     Pupils: Pupils are equal, round, and reactive to light.  Cardiovascular:     Rate and Rhythm: Normal rate and regular rhythm.     Pulses: Normal pulses.     Heart sounds: Normal heart sounds.  Pulmonary:     Effort: Pulmonary effort is normal.     Breath sounds: Rhonchi present.  Abdominal:     General: Abdomen is flat. Bowel sounds are normal.     Palpations: Abdomen is soft.  Musculoskeletal:     Cervical back: Normal range of motion and neck supple.     Right lower leg: Edema present.     Left lower leg: Edema present.  Skin:    General: Skin is warm and dry.     Capillary Refill: Capillary refill takes  less than 2 seconds.  Neurological:     General: No focal deficit present.     Mental Status: He is alert and oriented to person, place, and time.  Psychiatric:        Mood and Affect: Mood normal.        Behavior: Behavior normal.        Thought Content: Thought content normal.     ED Results / Procedures / Treatments   Labs (all labs ordered are listed, but only abnormal results are displayed) Labs Reviewed  COMPREHENSIVE METABOLIC PANEL - Abnormal; Notable for the following components:      Result Value   Sodium 132 (*)    Glucose, Bld 126 (*)    Calcium  8.0 (*)    Total Protein 6.3 (*)    Albumin 3.0 (*)    All other components within normal limits  CBC WITH DIFFERENTIAL/PLATELET - Abnormal; Notable for the following components:   RBC 2.43 (*)    Hemoglobin 8.5 (*)    HCT 26.1 (*)    MCV 107.4 (*)    MCH 35.0 (*)    Lymphs Abs 0.5 (*)    All other components within normal limits  D-DIMER, QUANTITATIVE - Abnormal; Notable for the following components:   D-Dimer, Quant 3.14 (*)    All other components within normal limits  RESP PANEL BY RT-PCR (RSV, FLU A&B, COVID)  [id number]  CULTURE, BLOOD (ROUTINE X 2)  CULTURE, BLOOD (ROUTINE X 2)  URINALYSIS, ROUTINE W REFLEX MICROSCOPIC  I-STAT CG4 LACTIC ACID, ED  I-STAT CG4 LACTIC ACID, ED    EKG EKG Interpretation Date/Time:  Monday January 06 2024 09:42:32 EST Ventricular Rate:  91 PR Interval:  154 QRS Duration:  111 QT Interval:  360 QTC Calculation: 443 R Axis:   21  Text Interpretation: Sinus rhythm Low voltage, precordial leads Abnormal R-wave progression, early transition No significant change since last tracing Confirmed by Dean Clarity [phone]) on 01/06/2024 10:11:16 AM  Radiology VAS US  LOWER EXTREMITY VENOUS (DVT) (7a-7p) Result Date: 01/06/2024  Lower Venous DVT Study Patient Name:  Frederick Ponce  Date of Exam:   01/06/2024 Medical Rec #: [id number]           Accession #:    [id number] Date of Birth:  1946-12-19           Patient Gender: M Patient Age:   41 years Exam Location:  [facility] Procedure:  VAS US  LOWER EXTREMITY VENOUS (DVT) Referring Phys: Koby Pickup --------------------------------------------------------------------------------  Indications: Edema.  Risk Factors: Metastatic prostate cancer. Comparison Study: No previosu exams Performing Technologist: Jody Hill RVT, RDMS  Examination Guidelines: A complete evaluation includes B-mode imaging, spectral Doppler, color Doppler, and power Doppler as needed of all accessible portions of each vessel. Bilateral testing is considered an integral part of a complete examination. Limited examinations for reoccurring indications may be performed as noted. The reflux portion of the exam is performed with the patient in reverse Trendelenburg.  +---------+---------------+---------+-----------+----------+--------------+ RIGHT    CompressibilityPhasicitySpontaneityPropertiesThrombus Aging +---------+---------------+---------+-----------+----------+--------------+ CFV      Full           Yes      Yes                                 +---------+---------------+---------+-----------+----------+--------------+ SFJ      Full                                                        +---------+---------------+---------+-----------+----------+--------------+ FV Prox  Full           Yes      Yes                                 +---------+---------------+---------+-----------+----------+--------------+ FV Mid   Full           Yes      Yes                                 +---------+---------------+---------+-----------+----------+--------------+ FV DistalFull           Yes      Yes                                 +---------+---------------+---------+-----------+----------+--------------+ PFV      Full                                                         +---------+---------------+---------+-----------+----------+--------------+ POP      Full           Yes      Yes                                 +---------+---------------+---------+-----------+----------+--------------+ PTV      Full                                                        +---------+---------------+---------+-----------+----------+--------------+ PERO     Full                                                        +---------+---------------+---------+-----------+----------+--------------+   +---------+---------------+---------+-----------+----------+--------------+  LEFT     CompressibilityPhasicitySpontaneityPropertiesThrombus Aging +---------+---------------+---------+-----------+----------+--------------+ CFV      Full           Yes      Yes                                 +---------+---------------+---------+-----------+----------+--------------+ SFJ      Full                                                        +---------+---------------+---------+-----------+----------+--------------+ FV Prox  Full           Yes      Yes                                 +---------+---------------+---------+-----------+----------+--------------+ FV Mid   Full           Yes      Yes                                 +---------+---------------+---------+-----------+----------+--------------+ FV DistalFull           Yes      Yes                                 +---------+---------------+---------+-----------+----------+--------------+ PFV      Full                                                        +---------+---------------+---------+-----------+----------+--------------+ POP      Full           Yes      Yes                                 +---------+---------------+---------+-----------+----------+--------------+ PTV      Full                                                         +---------+---------------+---------+-----------+----------+--------------+ PERO     Full                                                        +---------+---------------+---------+-----------+----------+--------------+    Summary: BILATERAL: - No evidence of deep vein thrombosis seen in the lower extremities, bilaterally. -No evidence of popliteal cyst, bilaterally. -Subcutaneous edema of calf and ankles, bilaterally.   *See table(s) above for measurements and observations.    Preliminary    CT Angio Chest PE W and/or Wo Contrast Result Date: 01/06/2024 CLINICAL DATA:  Body aches for the  past 4 days. Recently started radiation therapy for prostate cancer. EXAM: CT ANGIOGRAPHY CHEST WITH CONTRAST TECHNIQUE: Multidetector CT imaging of the chest was performed using the standard protocol during bolus administration of intravenous contrast. Multiplanar CT image reconstructions and MIPs were obtained to evaluate the vascular anatomy. RADIATION DOSE REDUCTION: This exam was performed according to the departmental dose-optimization program which includes automated exposure control, adjustment of the mA and/or kV according to patient size and/or use of iterative reconstruction technique. CONTRAST:  75mL OMNIPAQUE  IOHEXOL  350 MG/ML SOLN COMPARISON:  Chest x-ray from same day. PET-CT dated November 04, 2023. CT chest dated July 18, 2023. FINDINGS: Cardiovascular: Satisfactory opacification of the pulmonary arteries to the segmental level. No evidence of pulmonary embolism. Unchanged mild cardiomegaly. No pericardial effusion. No thoracic aortic aneurysm or dissection. Coronary, aortic arch, and branch vessel atherosclerotic vascular disease. Mediastinum/Nodes: Markedly progressive mediastinal and left hilar lymphadenopathy. Conglomerate prevascular lymphadenopathy measuring up to 1.8 cm in short axis. No enlarged axillary lymph nodes. Thyroid gland, trachea, and esophagus demonstrate no significant findings.  Lungs/Pleura: Patchy consolidation, peribronchovascular nodularity, ground-glass density, and interlobular septal thickening in the left lower lobe. The pulmonary metastasis in this area appears larger, although it is difficult to delineate given superimposed parenchymal process. Trace to small left pleural effusion. No pneumothorax. 13 x 7 mm irregular spiculated nodule in the anterior left upper lobe (series 12, image 67, previously 8 x 5 mm on prior PET, and 9 x 7 mm on prior chest CT. There was no abnormal uptake in this area on the PET scan. Upper Abdomen: No acute abnormality. Unchanged right renal calculi and pelviectasis. Musculoskeletal: No chest wall abnormality. Sclerotic lesions involving the T7 vertebral body, left T7 transverse process, and left first and eighth ribs are unchanged. Review of the MIP images confirms the above findings. IMPRESSION: 1. No evidence of pulmonary embolism. 2. Patchy consolidation, peribronchovascular nodularity, ground-glass density, and interlobular septal thickening in the left lower lobe. The pulmonary metastasis in this area appears larger, although it is difficult to delineate given superimposed parenchymal process. Findings are favored to reflect disease progression with superimposed pneumonia and/or radiation pneumonitis. 3. Markedly progressive mediastinal and left hilar lymphadenopathy, consistent with worsening metastatic disease. 4. 13 x 7 mm irregular spiculated nodule in the anterior left upper lobe, previously 8 x 5 mm on prior PET, and 9 x 7 mm on prior chest CT. There was no abnormal uptake in this area on the PET scan, but given other findings on today's study, is concerning for recurrent metastasis. 5. Unchanged treated osseous metastatic disease. 6.  Aortic Atherosclerosis (ICD10-I70.0). Electronically Signed   By: Elsie ONEIDA Shoulder M.D.   On: 01/06/2024 12:48   DG Chest Portable 1 View Result Date: 01/06/2024 CLINICAL DATA:  78 year old male with cough.  History of prostate cancer. EXAM: PORTABLE CHEST 1 VIEW COMPARISON:  CT Chest, Abdomen, and Pelvis 07/18/2023 and earlier. FINDINGS: Portable AP view at 0925 hours. Lung volumes and mediastinal contours have not significantly changed since 2023. Visualized tracheal air column is within normal limits. No pneumothorax, pleural effusion, pulmonary edema. No confluent right lung opacity, but there is Patchy and confluent new left infrahilar opacity, superimposed over area of pulmonary metastasis on the CT last year. No acute or suspicious osseous lesion identified. Visible bowel-gas within normal limits. IMPRESSION: 1. Increased patchy and confluent left lung base opacity in an area of treated pulmonary metastatic disease previously. The appearance more resembles pneumonia than progressed metastases. No pleural effusion is evident. 2. Followup  PA and lateral chest X-ray is recommended in 3-4 weeks following trial of antibiotic therapy to ensure resolution and exclude underlying malignancy. Electronically Signed   By: VEAR Hurst M.D.   On: 01/06/2024 09:58    Procedures Procedures    Medications Ordered in ED Medications  sodium chloride  0.9 % bolus 1,000 mL (0 mLs Intravenous Stopped 01/06/24 1124)  HYDROmorphone  (DILAUDID ) injection 1 mg (1 mg Intravenous Given 01/06/24 0909)  ondansetron  (ZOFRAN ) injection 4 mg (4 mg Intravenous Given 01/06/24 0909)  HYDROmorphone  (DILAUDID ) injection 1 mg (1 mg Intravenous Given 01/06/24 1015)  cefTRIAXone  (ROCEPHIN ) 1 g in sodium chloride  0.9 % 100 mL IVPB (0 g Intravenous Stopped 01/06/24 1132)  azithromycin  (ZITHROMAX ) 500 mg in sodium chloride  0.9 % 250 mL IVPB (0 mg Intravenous Stopped 01/06/24 1309)  iohexol  (OMNIPAQUE ) 350 MG/ML injection 75 mL (75 mLs Intravenous Contrast Given 01/06/24 1136)    ED Course/ Medical Decision Making/ A&P                                 Medical Decision Making Amount and/or Complexity of Data Reviewed Labs: ordered. Radiology:  ordered.  Risk Prescription drug management. Decision regarding hospitalization.   This patient presents to the ED for concern of pain, body aches, this involves an extensive number of treatment options, and is a complaint that carries with it a high risk of complications and morbidity.  The differential diagnosis includes covid/flu/rsv, cancer pain, electrolyte abn, anemia   Co morbidities that complicate the patient evaluation   parkinson's disease, hld, arthritis, depression, gerd, htn, and metastatic prostate cancer   Additional history obtained:  Additional history obtained from epic chart review External records from outside source obtained and reviewed including husband   Lab Tests:  I Ordered, and personally interpreted labs.  The pertinent results include:  cbc with hgb low at 8.5 (hgb 9.8 on 1/2); covid/flu/rsv neg; ddimer + at 3.14, lactic 0.7   Imaging Studies ordered:  I ordered imaging studies including cxr. CT chest, US  legs  I independently visualized and interpreted imaging which showed  CXR:  Increased patchy and confluent left lung base opacity in an area  of treated pulmonary metastatic disease previously. The appearance  more resembles pneumonia than progressed metastases. No pleural  effusion is evident.  2. Followup PA and lateral chest X-ray is recommended in 3-4 weeks  following trial of antibiotic therapy to ensure resolution and  exclude underlying malignancy.  CT chest: IMPRESSION:  1. No evidence of pulmonary embolism.  2. Patchy consolidation, peribronchovascular nodularity,  ground-glass density, and interlobular septal thickening in the left  lower lobe. The pulmonary metastasis in this area appears larger,  although it is difficult to delineate given superimposed parenchymal  process. Findings are favored to reflect disease progression with  superimposed pneumonia and/or radiation pneumonitis.  3. Markedly progressive mediastinal and  left hilar lymphadenopathy,  consistent with worsening metastatic disease.  4. 13 x 7 mm irregular spiculated nodule in the anterior left upper  lobe, previously 8 x 5 mm on prior PET, and 9 x 7 mm on prior chest  CT. There was no abnormal uptake in this area on the PET scan, but  given other findings on today's study, is concerning for recurrent  metastasis.  5. Unchanged treated osseous metastatic disease.  6.  Aortic Atherosclerosis (ICD10-I70.0).  US : BILATERAL:  - No evidence of deep vein thrombosis seen in the lower extremities,  bilaterally.  -No evidence of popliteal cyst, bilaterally.  -Subcutaneous edema of calf and ankles, bilaterally.   I agree with the radiologist interpretation   Cardiac Monitoring:  The patient was maintained on a cardiac monitor.  I personally viewed and interpreted the cardiac monitored which showed an underlying rhythm of: nsr   Medicines ordered and prescription drug management:  I ordered medication including dilaudid   for pain; rocephin /zithromax  for pna  Reevaluation of the patient after these medicines showed that the patient improved I have reviewed the patients home medicines and have made adjustments as needed   Test Considered:  ct   Critical Interventions:  abx   Consultations Obtained:  I requested consultation with the hospitalist (Dr. Celinda),  and discussed lab and imaging findings as well as pertinent plan - he will admit   Problem List / ED Course:  Intractable pain:  pain has improved after dilaudid , but it is still there. CAP:  likely post obstructive as lung mets have also worsened.  Iv rocephin /zithromax  ordered.  No sepsis. Metastatic prostate cancer:  worsening despite tx   Reevaluation:  After the interventions noted above, I reevaluated the patient and found that they have :improved   Social Determinants of Health:  Lives at home   Dispostion:  After consideration of the diagnostic results and the  patients response to treatment, I feel that the patent would benefit from admission.  CRITICAL CARE Performed by: Mliss Boyers   Total critical care time: 30 minutes  Critical care time was exclusive of separately billable procedures and treating other patients.  Critical care was necessary to treat or prevent imminent or life-threatening deterioration.  Critical care was time spent personally by me on the following activities: development of treatment plan with patient and/or surrogate as well as nursing, discussions with consultants, evaluation of patient's response to treatment, examination of patient, obtaining history from patient or surrogate, ordering and performing treatments and interventions, ordering and review of laboratory studies, ordering and review of radiographic studies, pulse oximetry and re-evaluation of patient's condition.           Final Clinical Impression(s) / ED Diagnoses Final diagnoses:  Community acquired pneumonia of left lower lobe of lung  Malignant neoplasm metastatic to both lungs (HCC)  Intractable pain  Peripheral edema    Rx / DC Orders ED Discharge Orders     None         Boyers Mliss, MD 01/06/24 1353

## 2024-01-06 NOTE — Progress Notes (Signed)
 BLE venous duplex has been completed.  Preliminary results given to Dr. Particia Nearing.  Results can be found under chart review under CV PROC. 01/06/2024 1:24 PM Nury Nebergall RVT, RDMS

## 2024-01-07 DIAGNOSIS — R627 Adult failure to thrive: Secondary | ICD-10-CM | POA: Diagnosis not present

## 2024-01-07 DIAGNOSIS — G8929 Other chronic pain: Secondary | ICD-10-CM | POA: Diagnosis present

## 2024-01-07 LAB — BASIC METABOLIC PANEL
Anion gap: 6 (ref 5–15)
BUN: 10 mg/dL (ref 8–23)
CO2: 19 mmol/L — ABNORMAL LOW (ref 22–32)
Calcium: 7.2 mg/dL — ABNORMAL LOW (ref 8.9–10.3)
Chloride: 106 mmol/L (ref 98–111)
Creatinine, Ser: 0.64 mg/dL (ref 0.61–1.24)
GFR, Estimated: >60 mL/min (ref >60)
Glucose, Bld: 106 mg/dL — ABNORMAL HIGH (ref 70–99)
Potassium: 3.6 mmol/L (ref 3.5–5.1)
Sodium: 131 mmol/L — ABNORMAL LOW (ref 135–145)

## 2024-01-07 LAB — CBC WITH DIFFERENTIAL/PLATELET
Abs Immature Granulocytes: 0.01 10*3/uL (ref 0.00–0.07)
Abs Immature Granulocytes: 0.03 10*3/uL (ref 0.00–0.07)
Basophils Absolute: 0 10*3/uL (ref 0.0–0.1)
Basophils Absolute: 0 10*3/uL (ref 0.0–0.1)
Basophils Relative: 0 %
Basophils Relative: 0 %
Eosinophils Absolute: 0 10*3/uL (ref 0.0–0.5)
Eosinophils Absolute: 0 10*3/uL (ref 0.0–0.5)
Eosinophils Relative: 0 %
Eosinophils Relative: 0 %
HCT: 23.4 % — ABNORMAL LOW (ref 39.0–52.0)
HCT: 25.8 % — ABNORMAL LOW (ref 39.0–52.0)
Hemoglobin: 7.3 g/dL — ABNORMAL LOW (ref 13.0–17.0)
Hemoglobin: 8.2 g/dL — ABNORMAL LOW (ref 13.0–17.0)
Immature Granulocytes: 0 %
Immature Granulocytes: 1 %
Lymphocytes Relative: 7 %
Lymphocytes Relative: 11 %
Lymphs Abs: 0.4 10*3/uL — ABNORMAL LOW (ref 0.7–4.0)
Lymphs Abs: 0.7 10*3/uL (ref 0.7–4.0)
MCH: 34.3 pg — ABNORMAL HIGH (ref 26.0–34.0)
MCH: 34.2 pg — ABNORMAL HIGH (ref 26.0–34.0)
MCHC: 31.2 g/dL (ref 30.0–36.0)
MCHC: 31.8 g/dL (ref 30.0–36.0)
MCV: 109.9 fL — ABNORMAL HIGH (ref 80.0–100.0)
MCV: 107.5 fL — ABNORMAL HIGH (ref 80.0–100.0)
Monocytes Absolute: 0.7 10*3/uL (ref 0.1–1.0)
Monocytes Absolute: 0.6 10*3/uL (ref 0.1–1.0)
Monocytes Relative: 11 %
Monocytes Relative: 10 %
Neutro Abs: 4.8 10*3/uL (ref 1.7–7.7)
Neutro Abs: 5 10*3/uL (ref 1.7–7.7)
Neutrophils Relative %: 82 %
Neutrophils Relative %: 78 %
Platelets: 246 10*3/uL (ref 150–400)
Platelets: 294 10*3/uL (ref 150–400)
RBC: 2.13 MIL/uL — ABNORMAL LOW (ref 4.22–5.81)
RBC: 2.4 MIL/uL — ABNORMAL LOW (ref 4.22–5.81)
RDW: 13.7 % (ref 11.5–15.5)
RDW: 13.6 % (ref 11.5–15.5)
WBC: 6 10*3/uL (ref 4.0–10.5)
WBC: 6.4 10*3/uL (ref 4.0–10.5)
nRBC: 0 % (ref 0.0–0.2)
nRBC: 0 % (ref 0.0–0.2)

## 2024-01-07 LAB — COMPREHENSIVE METABOLIC PANEL
ALT: 6 U/L (ref 0–44)
AST: 11 U/L — ABNORMAL LOW (ref 15–41)
Albumin: 2.7 g/dL — ABNORMAL LOW (ref 3.5–5.0)
Alkaline Phosphatase: 47 U/L (ref 38–126)
Anion gap: 8 (ref 5–15)
BUN: 10 mg/dL (ref 8–23)
CO2: 19 mmol/L — ABNORMAL LOW (ref 22–32)
Calcium: 7.2 mg/dL — ABNORMAL LOW (ref 8.9–10.3)
Chloride: 106 mmol/L (ref 98–111)
Creatinine, Ser: 0.6 mg/dL — ABNORMAL LOW (ref 0.61–1.24)
GFR, Estimated: >60 mL/min (ref >60)
Glucose, Bld: 107 mg/dL — ABNORMAL HIGH (ref 70–99)
Potassium: 3.4 mmol/L — ABNORMAL LOW (ref 3.5–5.1)
Sodium: 133 mmol/L — ABNORMAL LOW (ref 135–145)
Total Bilirubin: 0.8 mg/dL (ref 0.0–1.2)
Total Protein: 5.5 g/dL — ABNORMAL LOW (ref 6.5–8.1)

## 2024-01-07 LAB — MAGNESIUM: Magnesium: 2.2 mg/dL (ref 1.7–2.4)

## 2024-01-07 LAB — PHOSPHORUS: Phosphorus: 2 mg/dL — ABNORMAL LOW (ref 2.5–4.6)

## 2024-01-07 MED ORDER — MORPHINE SULFATE 15 MG PO TABS
15.0000 mg | ORAL_TABLET | ORAL | Status: DC | PRN
  Administered 2024-01-09 – 2024-01-10 (×2): 15 mg via ORAL
  Filled 2024-01-07 (×3): qty 1

## 2024-01-07 MED ORDER — K PHOS MONO-SOD PHOS DI & MONO 155-852-130 MG PO TABS
500.0000 mg | ORAL_TABLET | Freq: Once | ORAL | Status: AC
  Administered 2024-01-07: 500 mg via ORAL
  Filled 2024-01-07: qty 2

## 2024-01-07 MED ORDER — GUAIFENESIN ER 600 MG PO TB12
1200.0000 mg | ORAL_TABLET | Freq: Two times a day (BID) | ORAL | Status: DC | PRN
  Administered 2024-01-08: 1200 mg via ORAL
  Filled 2024-01-07: qty 2

## 2024-01-07 MED ORDER — AZITHROMYCIN 250 MG PO TABS
250.0000 mg | ORAL_TABLET | Freq: Every day | ORAL | Status: DC
  Administered 2024-01-07 – 2024-01-08 (×2): 250 mg via ORAL
  Filled 2024-01-07 (×2): qty 1

## 2024-01-07 MED ORDER — POTASSIUM CHLORIDE CRYS ER 20 MEQ PO TBCR
40.0000 meq | EXTENDED_RELEASE_TABLET | Freq: Once | ORAL | Status: DC
Start: 2024-01-07 — End: 2024-01-07

## 2024-01-07 MED ORDER — MELATONIN 3 MG PO TABS
3.0000 mg | ORAL_TABLET | Freq: Every day | ORAL | Status: DC
  Administered 2024-01-07 – 2024-01-09 (×3): 3 mg via ORAL
  Filled 2024-01-07 (×3): qty 1

## 2024-01-07 NOTE — Progress Notes (Addendum)
 PT Cancellation Note  Patient Details Name: Darryle Dennie MRN: [id number] DOB: 07-Jan-1946   Cancelled Treatment:    Reason Eval/Treat Not Completed:  Attempted PT eval-staff/MD in with patient. Will check back as schedule allows.    Dannial SQUIBB, PT Acute Rehabilitation  Office: [phone]

## 2024-01-07 NOTE — Hospital Course (Addendum)
 77yo with h/o depression, prostate CA, HLD, and Parkinson's dementia who presented on 1/13 with myalgias.  CT with superimposed PNA and/or radiation pneumonitis.  He is on RA.  He was started on Ceftriaxone  and Azithromycin .  Appears to be more consistent with pneumonitis, imaging with progressive metastatic disease.  Oncology, palliative care consulted.  FTT - PT/OT/SLP/nutrition consults.

## 2024-01-07 NOTE — Progress Notes (Signed)
 Progress Note   Patient: Frederick Ponce FMW:[id number] DOB: 02-04-1946 DOA: 01/06/2024     1 DOS: the patient was seen and examined on 01/07/2024   Brief hospital course: 78yo with h/o depression, prostate CA, HLD, and Parkinson's dementia who presented on 1/13 with myalgias.  CT with superimposed PNA and/or radiation pneumonitis.  He is on RA.  He was started on Ceftriaxone  and Azithromycin .  Assessment and Plan:  Failure to Thrive Patient presented with generalized weakness and myalgias in the setting of metastatic castrate-resistant prostate CA Admitted to Inpatient Telemetry Unremarkable evaluation thus far other than possible PNA vs. Radiation pneumonitis (see below) PT/OT/ST/nutrition evaluations Will request palliative care evaluation for goals of care    CAP vs. Radiation pneumonitis CTA shows disease progression with possible superimposed pneumonia and/or radiation pneumonitis Also with an enlarging lung nodule (13 x 7 mm, previously 8-9 x 7 mm), concerning for recurrent metastasis Respiratory status is not labored and he remains on RA Will continue empiric antibiotics with IV ceftriaxone  and azithromycin  for now, but fairly low suspicion for infection currently He reports some difficulty with swallowing; will order SLP evaluation given h/o CA + Parkinson's Will add guaifenesin  1200 mL p.o. twice daily prn, flutter valve and incentive spirometry   HLD Hold Atorvastatin  40 mg for now given Generalized Weakness  Parkinson's Disease  C/w Carbidopa -Levodopa  25-100 mg po 4 times daily   Metastatic Castrate Resistant Prostate Cancer CT with markedly progressive mediastinal and L hilar LAD, consistent with worsening metastatic disease Will request that Dr. Federico consult Sees Dr. Malva of Nuclear Medicine and has had 1 treatment of Pluvicto  thus far   Chronic Pain C/w Fentanyl  Patch 50 mcg q72h and MSIR 15 mg q4hprn C/w Hydromorphone  1 mg IV q4hprn Severe Pain    Elevated D-Dimer D-Dimer was 3.14 CTA PE negative LE Venous Duplex also negative Suspect that this is related to his ongoing malignancy   Hyponatremia Given a 1 Liter bolus Continue MIVF with NS at 75 mL/hr for now   Macrocytic Anemia Stable, likely related to cancer Will follow   HTN Continue amlodipine        Consultants: Oncology Palliative care PT OT SLP   Procedures: None   Antibiotics: Ceftriaxone  1/13- Azithromycin  1/13-  30 Day Unplanned Readmission Risk Score    Flowsheet Row ED to Hosp-Admission (Current) from 01/06/2024 in [facility]  30 Day Unplanned Readmission Risk Score (%) 15.6 Filed at 01/07/2024 78  This score is the patient's risk of an unplanned readmission within 30 days of being discharged (0 -100%). The score is based on dignosis, age, lab data, medications, orders, and past utilization.   Low:  0-14.9   Medium: 79-21.9   High: 22-29.9   Extreme: 30 and above           Subjective: Continues to feel generally weak.  At baseline, has been able to do less of his usual fun activities and is dwindling.     Objective: Vitals:   01/07/24 1000 01/07/24 1423  BP: 137/68 136/73  Pulse:  (!) 106  Resp:  18  Temp:  98.7 F (37.1 C)  SpO2:  95%    Intake/Output Summary (Last 24 hours) at 01/07/2024 1703 Last data filed at 01/07/2024 1533 Gross per 24 hour  Intake 1552.31 ml  Output 2 ml  Net 1550.31 ml   Filed Weights   01/06/24 0826  Weight: 79.1 kg    Exam:  General:  Appears calm and comfortable and is in NAD, on RA Eyes:  EOMI, normal lids, iris ENT:  grossly normal hearing, lips & tongue, mmm Neck:  no LAD, masses or thyromegaly Cardiovascular:  RRR, no m/r/g. No LE edema.  Respiratory:   CTA bilaterally with no wheezes/rales/rhonchi.  Normal respiratory effort. Abdomen:  soft, NT, ND Skin:  no rash or induration seen on limited exam Musculoskeletal:  grossly normal tone BUE/BLE, good ROM, no  bony abnormality Psychiatric:  blunted mood and affect, speech fluent and appropriate, AOx3 Neurologic:  CN 2-12 grossly intact, moves all extremities in coordinated fashion  Data Reviewed: I have reviewed the patient's lab results since admission.  Pertinent labs for today include:   Na++ 131 CO2 19 Glucose 106 Hgb 8.2 Blood cultures pending  Lactate 0.7, 0.5 Procalcitonin <0.10   Family Communication: None present  Disposition: Status is: Inpatient Remains inpatient appropriate because: ongoing evaluation and treatment     Time spent: 50 minutes  Unresulted Labs (From admission, onward)     Start     Ordered   01/08/24 0500  CBC with Differential/Platelet  Tomorrow morning,   R       Question:  Specimen collection method  Answer:  Lab=Lab collect   01/07/24 1701   01/08/24 0500  Comprehensive metabolic panel  Tomorrow morning,   R       Question:  Specimen collection method  Answer:  Lab=Lab collect   01/07/24 1701             Author: Delon Herald, MD 01/07/2024 5:03 PM  For on call review [email or web address].

## 2024-01-07 NOTE — Evaluation (Signed)
 Occupational Therapy Evaluation Patient Details Name: Frederick Ponce MRN: [id number] DOB: 04-17-1946 Today's Date: 01/07/2024   History of Present Illness Patient is a 78 year old male who presented on 1/13 with weakness and body aches. Patient was admitted with CAP, generalized weakness and diffuse body aches in setting of pluvicto . 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   Clinical Impression   Patient is a 78 year old male who was admitted for above. Patient was living at home with independence in ADLs with significant other. Currently, patient is CGA for functional mobility and transfers in room with increased A for LB dressing with noted use of AE at baseline at home. Patient reporting increased pain and not having had parkinsons meds in system long enough impacting participation. Patient was noted to have decreased functional activity tolerance, decreased endurance, decreased standing balance, decreased safety awareness, and decreased knowledge of AD/AE impacting participation in ADLs. Plan is to d/c home with significant other when medically stable with no OT follow up.        If plan is discharge home, recommend the following: A little help with walking and/or transfers;A little help with bathing/dressing/bathroom;Assistance with cooking/housework;Assist for transportation;Help with stairs or ramp for entrance    Functional Status Assessment  Patient has had a recent decline in their functional status and demonstrates the ability to make significant improvements in function in a reasonable and predictable amount of time.  Equipment Recommendations  None recommended by OT       Precautions / Restrictions Precautions Precautions: Fall Restrictions Weight Bearing Restrictions Per Provider Order: No      Mobility Bed Mobility Overal bed mobility: Needs Assistance Bed  Mobility: Supine to Sit     Supine to sit: Supervision, HOB elevated     General bed mobility comments: increased time      Balance Overall balance assessment: Mild deficits observed, not formally tested           ADL either performed or assessed with clinical judgement   ADL Overall ADL's : Needs assistance/impaired Eating/Feeding: Modified independent;Sitting Eating/Feeding Details (indicate cue type and reason): taking sips of water Grooming: Wash/dry hands;Standing;Supervision/safety   Upper Body Bathing: Set up;Sitting   Lower Body Bathing: Moderate assistance Lower Body Bathing Details (indicate cue type and reason): patient uses AE for LB bathing and dressing at baseline                             Vision   Vision Assessment?: No apparent visual deficits            Pertinent Vitals/Pain Pain Assessment Pain Assessment: Faces Faces Pain Scale: Hurts even more Pain Location: L leg and BUE with movement Pain Descriptors / Indicators: Grimacing Pain Intervention(s): Limited activity within patient's tolerance, Monitored during session, Patient requesting pain meds-RN notified     Extremity/Trunk Assessment Upper Extremity Assessment Upper Extremity Assessment: RUE deficits/detail;LUE deficits/detail;Right hand dominant RUE Deficits / Details: patient noted to have abduction to about 80 degrees with increased pain and grimancing with attempts. LUE Deficits / Details: patient noted to have abduction to about 80 degrees with increased pain and grimancing with attempts.   Lower Extremity Assessment Lower Extremity Assessment: Defer to PT evaluation       Communication     Cognition Arousal: Alert Behavior During Therapy: [facility] for tasks assessed/performed Overall Cognitive Status: Within Functional  Limits for tasks assessed                                                  Home Living Family/patient expects to be discharged  to:: Private residence Living Arrangements: Spouse/significant other Available Help at Discharge: Family;Available 24 hours/day Type of Home: House Home Access: Stairs to enter [employer] of Steps: 5   Home Layout: One level     Bathroom Shower/Tub: Tub/shower unit         Home Equipment: None;Other (comment)   Additional Comments: reacher, sock aid, long handled shoe horn      Prior Functioning/Environment Prior Level of Function : Independent/Modified Independent               ADLs Comments: likes to make beds in house and help care for dogs. wears baithing suits for pants at baseline. uses AE for LB Dressing        OT Problem List: Impaired balance (sitting and/or standing);Decreased safety awareness;Pain;Impaired UE functional use;Decreased range of motion;Decreased activity tolerance      OT Treatment/Interventions: Self-care/ADL training;DME and/or AE instruction;Therapeutic activities;Balance training;Patient/family education;Energy conservation    OT Goals(Current goals can be found in the care plan section) Acute Rehab OT Goals Patient Stated Goal: to get pain under control OT Goal Formulation: With patient Time For Goal Achievement: 01/21/24 Potential to Achieve Goals: Fair  OT Frequency: Min 1X/week       AM-PAC OT 6 Clicks Daily Activity     Outcome Measure Help from another person eating meals?: None Help from another person taking care of personal grooming?: A Little Help from another person toileting, which includes using toliet, bedpan, or urinal?: A Little Help from another person bathing (including washing, rinsing, drying)?: A Little Help from another person to put on and taking off regular upper body clothing?: A Little Help from another person to put on and taking off regular lower body clothing?: A Lot 6 Click Score: 18   End of Session Equipment Utilized During Treatment: Gait belt Nurse Communication: Patient requests  pain meds  Activity Tolerance: Patient tolerated treatment well;Patient limited by pain Patient left: in chair;with call bell/phone within reach;Other (comment) (nurse in room)  OT Visit Diagnosis: Unsteadiness on feet (R26.81);Other abnormalities of gait and mobility (R26.89);Pain                Time: 9055-8994 OT Time Calculation (min): 21 min Charges:  OT General Charges $OT Visit: 1 Visit OT Evaluation $OT Eval Low Complexity: 1 Low  Chinaza Rooke OTR/L, MS Acute Rehabilitation Department Office# [phone]   Geofm CHRISTELLA Dance 01/07/2024, 10:13 AM

## 2024-01-07 NOTE — Evaluation (Signed)
 Physical Therapy Evaluation Patient Details Name: Frederick Ponce MRN: [id number] DOB: 30-Jul-1946 Today's Date: 01/07/2024  History of Present Illness  Patient is a 78 year old male who presented on 1/13 with weakness and body aches. Patient was admitted with CAP, generalized weakness and diffuse body aches in setting of pluvicto . 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  Clinical Impression  On eval, pt required Min A . He walked ~75 feet with use of IV pole for support/steadying. He fatigued fairly easily. Pt presents with general weakness, decreased activity tolerance, and impaired gait and balance. Will plan to follow and progress activity as tolerated. Recommend HHPT f/u and RW for ambulation safety at this time. Recommend hallway ambulation with nursing and/or mobility team as able.         If plan is discharge home, recommend the following: A little help with walking and/or transfers;A little help with bathing/dressing/bathroom;Assistance with cooking/housework;Assist for transportation;Help with stairs or ramp for entrance   Can travel by private vehicle        Equipment Recommendations Rolling walker (2 wheels)  Recommendations for Other Services       Functional Status Assessment Patient has had a recent decline in their functional status and demonstrates the ability to make significant improvements in function in a reasonable and predictable amount of time.     Precautions / Restrictions Precautions Precautions: Fall Restrictions Weight Bearing Restrictions Per Provider Order: No      Mobility  Bed Mobility Overal bed mobility: Needs Assistance Bed Mobility: Supine to Sit, Sit to Supine     Supine to sit: Supervision, HOB elevated, Used rails Sit to supine: Min assist, HOB elevated, Used rails   General bed mobility comments: Assist for LEs onto bed.  Increased time. Task was effortful.    Transfers Overall transfer level: Needs assistance   Transfers: Sit to/from Stand Sit to Stand: Supervision           General transfer comment: for safety    Ambulation/Gait Ambulation/Gait assistance: Min assist Gait Distance (Feet): 75 Feet Assistive device: IV Pole Gait Pattern/deviations: Decreased stride length       General Gait Details: Unsteady and fatigues easily. Some LE instability intermittently. Gait lacked fluidity with intermittent brief pauses. Dyspnea 2/4-pt required a standing rest break after ~50 feet.  Stairs            Wheelchair Mobility     Tilt Bed    Modified Rankin (Stroke Patients Only)       Balance Overall balance assessment: Needs assistance         Standing balance support: Single extremity supported Standing balance-Leahy Scale: Poor                               Pertinent Vitals/Pain Pain Assessment Pain Assessment: Faces Faces Pain Scale: Hurts little more Pain Location: did not state specifically Pain Intervention(s): Limited activity within patient's tolerance, Monitored during session, Repositioned    Home Living Family/patient expects to be discharged to:: Private residence Living Arrangements: Spouse/significant other Available Help at Discharge: Family;Available 24 hours/day Type of Home: House Home Access: Stairs to enter   [employer] of Steps: 5   Home Layout: One level Home Equipment: None;Other (comment) Additional Comments: reacher, sock aid, long handled shoe horn    Prior Function Prior Level of Function : Independent/Modified Independent  Mobility Comments: ambulatory without a device ADLs Comments: likes to make beds in house and help care for dogs. wears baithing suits for pants at baseline. uses AE for LB Dressing     Extremity/Trunk Assessment   Upper Extremity Assessment Upper Extremity Assessment: Defer  to OT evaluation    Lower Extremity Assessment Lower Extremity Assessment: Generalized weakness       Communication   Communication Communication: No apparent difficulties  Cognition Arousal: Alert Behavior During Therapy: WFL for tasks assessed/performed Overall Cognitive Status: Within Functional Limits for tasks assessed                                          General Comments      Exercises     Assessment/Plan    PT Assessment Patient needs continued PT services  PT Problem List Decreased strength;Decreased range of motion;Decreased activity tolerance;Decreased balance;Decreased mobility;Decreased knowledge of use of DME;Pain       PT Treatment Interventions DME instruction;Gait training;Functional mobility training;Therapeutic activities;Therapeutic exercise;Patient/family education;Balance training    PT Goals (Current goals can be found in the Care Plan section)  Acute Rehab PT Goals Patient Stated Goal: none stated PT Goal Formulation: With patient Time For Goal Achievement: 01/21/24 Potential to Achieve Goals: Good    Frequency Min 1X/week     Co-evaluation               AM-PAC PT 6 Clicks Mobility  Outcome Measure Help needed turning from your back to your side while in a flat bed without using bedrails?: None Help needed moving from lying on your back to sitting on the side of a flat bed without using bedrails?: None Help needed moving to and from a bed to a chair (including a wheelchair)?: A Little Help needed standing up from a chair using your arms (e.g., wheelchair or bedside chair)?: A Little Help needed to walk in hospital room?: A Little Help needed climbing 3-5 steps with a railing? : A Lot 6 Click Score: 19    End of Session Equipment Utilized During Treatment: Gait belt Activity Tolerance: Patient tolerated treatment well;Patient limited by fatigue Patient left: in bed;with call bell/phone within reach;with bed  alarm set   PT Visit Diagnosis: Muscle weakness (generalized) (M62.81);Difficulty in walking, not elsewhere classified (R26.2)    Time: 8554-8541 PT Time Calculation (min) (ACUTE ONLY): 13 min   Charges:   PT Evaluation $PT Eval Low Complexity: 1 Low   PT General Charges $$ ACUTE PT VISIT: 1 Visit            Dannial SQUIBB, PT Acute Rehabilitation  Office: [phone]

## 2024-01-08 ENCOUNTER — Ambulatory Visit: Payer: Medicare Other | Admitting: Gastroenterology

## 2024-01-08 DIAGNOSIS — R52 Pain, unspecified: Secondary | ICD-10-CM | POA: Diagnosis not present

## 2024-01-08 DIAGNOSIS — Z515 Encounter for palliative care: Secondary | ICD-10-CM

## 2024-01-08 DIAGNOSIS — C61 Malignant neoplasm of prostate: Secondary | ICD-10-CM | POA: Diagnosis not present

## 2024-01-08 DIAGNOSIS — C7951 Secondary malignant neoplasm of bone: Secondary | ICD-10-CM

## 2024-01-08 DIAGNOSIS — C7802 Secondary malignant neoplasm of left lung: Secondary | ICD-10-CM

## 2024-01-08 DIAGNOSIS — J700 Acute pulmonary manifestations due to radiation: Secondary | ICD-10-CM

## 2024-01-08 DIAGNOSIS — R627 Adult failure to thrive: Secondary | ICD-10-CM | POA: Diagnosis not present

## 2024-01-08 DIAGNOSIS — C7801 Secondary malignant neoplasm of right lung: Secondary | ICD-10-CM

## 2024-01-08 LAB — CBC WITH DIFFERENTIAL/PLATELET
Abs Immature Granulocytes: 0.03 10*3/uL (ref 0.00–0.07)
Basophils Absolute: 0 10*3/uL (ref 0.0–0.1)
Basophils Relative: 0 %
Eosinophils Absolute: 0 10*3/uL (ref 0.0–0.5)
Eosinophils Relative: 1 %
HCT: 22.9 % — ABNORMAL LOW (ref 39.0–52.0)
Hemoglobin: 7.2 g/dL — ABNORMAL LOW (ref 13.0–17.0)
Immature Granulocytes: 1 %
Lymphocytes Relative: 7 %
Lymphs Abs: 0.4 10*3/uL — ABNORMAL LOW (ref 0.7–4.0)
MCH: 33.6 pg (ref 26.0–34.0)
MCHC: 31.4 g/dL (ref 30.0–36.0)
MCV: 107 fL — ABNORMAL HIGH (ref 80.0–100.0)
Monocytes Absolute: 0.6 10*3/uL (ref 0.1–1.0)
Monocytes Relative: 11 %
Neutro Abs: 4.5 10*3/uL (ref 1.7–7.7)
Neutrophils Relative %: 80 %
Platelets: 266 10*3/uL (ref 150–400)
RBC: 2.14 MIL/uL — ABNORMAL LOW (ref 4.22–5.81)
RDW: 13.7 % (ref 11.5–15.5)
WBC: 5.6 10*3/uL (ref 4.0–10.5)
nRBC: 0 % (ref 0.0–0.2)

## 2024-01-08 LAB — COMPREHENSIVE METABOLIC PANEL
ALT: 6 U/L (ref 0–44)
AST: 11 U/L — ABNORMAL LOW (ref 15–41)
Albumin: 2.5 g/dL — ABNORMAL LOW (ref 3.5–5.0)
Alkaline Phosphatase: 44 U/L (ref 38–126)
Anion gap: 11 (ref 5–15)
BUN: 10 mg/dL (ref 8–23)
CO2: 18 mmol/L — ABNORMAL LOW (ref 22–32)
Calcium: 6.9 mg/dL — ABNORMAL LOW (ref 8.9–10.3)
Chloride: 104 mmol/L (ref 98–111)
Creatinine, Ser: 0.65 mg/dL (ref 0.61–1.24)
GFR, Estimated: >60 mL/min (ref >60)
Glucose, Bld: 106 mg/dL — ABNORMAL HIGH (ref 70–99)
Potassium: 3.1 mmol/L — ABNORMAL LOW (ref 3.5–5.1)
Sodium: 133 mmol/L — ABNORMAL LOW (ref 135–145)
Total Bilirubin: 0.7 mg/dL (ref 0.0–1.2)
Total Protein: 5.2 g/dL — ABNORMAL LOW (ref 6.5–8.1)

## 2024-01-08 MED ORDER — POTASSIUM CHLORIDE CRYS ER 20 MEQ PO TBCR
40.0000 meq | EXTENDED_RELEASE_TABLET | Freq: Once | ORAL | Status: AC
  Administered 2024-01-08: 40 meq via ORAL
  Filled 2024-01-08: qty 2

## 2024-01-08 MED ORDER — ACETAMINOPHEN 500 MG PO TABS
1000.0000 mg | ORAL_TABLET | Freq: Three times a day (TID) | ORAL | Status: DC
  Administered 2024-01-08 – 2024-01-10 (×6): 1000 mg via ORAL
  Filled 2024-01-08 (×7): qty 2

## 2024-01-08 MED ORDER — FENTANYL 75 MCG/HR TD PT72
1.0000 | MEDICATED_PATCH | TRANSDERMAL | Status: DC
  Administered 2024-01-09: 1 via TRANSDERMAL
  Filled 2024-01-08: qty 1

## 2024-01-08 MED ORDER — CALCIUM GLUCONATE-NACL 1-0.675 GM/50ML-% IV SOLN
1.0000 g | Freq: Once | INTRAVENOUS | Status: AC
  Administered 2024-01-08: 1000 mg via INTRAVENOUS
  Filled 2024-01-08: qty 50

## 2024-01-08 MED ORDER — MAGIC MOUTHWASH W/LIDOCAINE: 10.0000 mL | Freq: Four times a day (QID) | ORAL | Status: DC | PRN

## 2024-01-08 NOTE — Consult Note (Signed)
 Consultation Note Date: 01/08/2024   Patient Name: Frederick Ponce  DOB: 1946/07/05  MRN: [id number]  Age / Sex: 78 y.o., male  PCP: Susanna Epley, FNP Referring Physician: [name], MD  Reason for Consultation: goals of care  HPI/Patient Profile: 78 y.o. 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 CT scan indicated increase in lung nodules, and pneumonia vs radiation pneumonitis. Palliative medicine consulted for goals of care.   Primary Decision Maker PATIENT - he has advance directive on chart and designated HCPOA is spouse- Frederick Ponce  Discussion: Chart reviewed including labs, progress notes, imaging from this and previous encounters.  Patient is followed by Palliative outpatient by Gardenia Jump, NP at [facility]- last seen on 1/6 for pain management.  Frederick Ponce has had difficult to manage pain related to arthritis, neuropathy and cancer related pain. He can't differentiate between the pains- together they cause overall generalized pain. He does note the pain in his legs is what is bothering him the most- it is a widespread pain, burning that he associates with lower extremity edema.  Pain and overall feeling of malaise has been worse since his Pluvicto  injection.  We discussed Frederick Ponce's goals of care. His goals are to live as good quality life as possible. He does not feel that having further Pluvicto  injections is going to support him in that goal.  We discussed possibility of transitioning to comfort focused care and hospice. He is interested in discussing this further with his spouse present.  I called his spouse- Myrtie Atkinson shared Frederick Ponce's journey in finding adequate pain relief. Myrtie Atkinson is  aware that Frederick Ponce doesn't wish to proceed with further cancer treatments. He is supportive of Frederick Ponce's decisions.  We reviewed hospice philosophy and services.  Frederick Ponce and Myrtie Atkinson both mentioned that Frederick Ponce's pain was best controlled when he was on extended relief morphine  and fentanyl  patch.  Myrtie Atkinson agrees to meeting tomorrow morning.   SUMMARY OF RECOMMENDATIONS -Stage IV prostate cancer with bone involvement - patient is not tolerating current treatment regimen- is considering transition to hospice- plan to meet with patient and his spouse tomorrow morning at 10am for further discussion -Symptom management- multifactorial pain related to bone metastasis, neuropathy and arthritis: has tried Cymbalta  and Neurontin  in past with no effect Increase fentanyl  patch to 75mcg q48 hrs Continue MS IR 15mg  po q4hr for breakthrough pain Continue dilaudid  1mg  IV q4hrs prn for severe breakthrough pain Acetaminophen  1000mg  TID po to potentiate action of opioid Can also consider starting low dose dexamethasone - will discuss with patient tomorrow Note- patient and his spouse mentioned he had relief with taking MS Contin  and Fentanyl  patch together- this is contraindicated due to both being long acting opioids- rather, I recommend maximizing fentanyl  patch and MSIR for breakthrough pain and utilizing adjuvant medications as well Will continue to evaluate  Code Status/Advance Care Planning: DNR  Prognosis:   Unable to determine likely less than six months if decision made to hold further cancer directed therapies and focus on symptom management  Discharge Planning: To Be Determined  Primary Diagnoses: Present on Admission:  HLD (hyperlipidemia)  Parkinson's disease (HCC)  Essential hypertension  Post-radiation pneumonitis (HCC)  Failure to thrive in adult  Prostate 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 [facility])  Chronic pain   Review of Systems  Constitutional:  Positive for fatigue.    Physical Exam Vitals and nursing  note reviewed.  Pulmonary:     Effort: Pulmonary effort is normal.  Musculoskeletal:     Comments: Bilateral lower extremity edema  Skin:    General: Skin is warm and dry.  Neurological:     Mental Status: He is alert and oriented to person, place, and time.     Vital Signs: BP 127/62 (BP Location: Left Arm)   Pulse 94   Temp 98 F (36.7 C) (Oral)   Resp 16   Ht 5\' 8"  (1.727 m)   Wt 79.1 kg   SpO2 95%   BMI 26.51 kg/m  Pain Scale: 0-10 POSS *See Group Information*: 1-Acceptable,Awake and alert Pain Score: 3    SpO2: SpO2: 95 % O2 Device:SpO2: 95 % O2 Flow Rate: .   IO: Intake/output summary:  Intake/Output Summary (Last 24 hours) at 01/08/2024 1607 Last data filed at 01/08/2024 1504 Gross per 24 hour  Intake 1132.79 ml  Output --  Net 1132.79 ml    LBM: Last BM Date : 01/06/24 Baseline Weight: Weight: 79.1 kg Most recent weight: Weight: 79.1 kg       Thank you for this consult. Palliative medicine will continue to follow and assist as needed.  Time Total: 135 minutes Signed by: Micki Alas, AGNP-C Palliative Medicine  Time includes:   Preparing to see the patient (e.g., review of tests) Obtaining and/or reviewing separately obtained history Performing a medically necessary appropriate examination and/or evaluation Counseling and educating the patient/family/caregiver Ordering medications, tests, or procedures Referring and communicating with other health care professionals (when not reported separately) Documenting clinical information in the electronic or other health record Independently interpreting results (not reported separately) and communicating results to the patient/family/caregiver Care coordination (not reported separately) Clinical documentation   Please contact Palliative Medicine Team phone at [phone] for questions and concerns.  For individual provider: See Tilford Foley

## 2024-01-08 NOTE — Progress Notes (Signed)
 Progress Note   Patient: Frederick Ponce ZOX:[id number] DOB: [birth year] DOA: 01/06/2024     2 DOS: the patient was seen and examined on 01/08/2024   Brief hospital course: 77yo with h/o depression, prostate CA, HLD, and Parkinson's dementia who presented on 1/13 with myalgias.  CT with superimposed PNA and/or radiation pneumonitis.  He is on RA.  He was started on Ceftriaxone  and Azithromycin .  Appears to be more consistent with pneumonitis, imaging with progressive metastatic disease.  Oncology, palliative care consulted.  FTT - PT/OT/SLP/nutrition consults.  Assessment and Plan:  Failure to Thrive Patient presented with generalized weakness and myalgias in the setting of metastatic castrate-resistant prostate CA Admitted to telemetry -> med surg Unremarkable evaluation thus far other than possible PNA vs. Radiation pneumonitis (see below) and progressive metastatic disease PT/OT/SLP/nutrition evaluations Will request palliative care evaluation for goals of care    CAP vs. Radiation pneumonitis CTA shows disease progression with possible superimposed pneumonia and/or radiation pneumonitis Also with an enlarging lung nodule (13 x 7 mm, previously 8-9 x 7 mm), concerning for recurrent metastasis Respiratory status is not labored and he remains on RA Given low suspicion for infection, will stop antibiotics He reports some difficulty with swallowing; SLP consulted given h/o CA + Parkinson's and is concerned about radiation esophagitis, will add magic mouthwash with lidocaine  Will add guaifenesin  1200 mL p.o. twice daily prn, flutter valve and incentive spirometry   HLD Hold Atorvastatin  40 mg for now given Generalized Weakness  Parkinson's Disease  Continue Carbidopa -Levodopa  25-100 mg po 4 times daily   Metastatic Castrate Resistant Prostate Cancer CT with markedly progressive mediastinal and L hilar LAD, consistent with worsening metastatic disease Requested that Dr. Rosaline Coma consult  on 1/14, awaiting his input Sees Dr. Dortha Gauss of Nuclear Medicine and has had 1 treatment of Pluvicto  thus far   Chronic Pain Continue Fentanyl  Patch 50 mcg q72h and MSIR 15 mg q4hprn Continue Hydromorphone  1 mg IV q4hprn Severe Pain Palliative care consulted and may also be able to help with symptom control   Elevated D-Dimer D-Dimer was 3.14 CTA PE negative LE Venous Duplex also negative Suspect that this is related to his ongoing malignancy   Hyponatremia Given a 1 Liter bolus and MIVF with NS at 75 mL/hr  Also with hypocalcemia, given correction  Recheck BMP in AM   Macrocytic Anemia Stable, likely related to cancer Will follow   HTN Continue amlodipine        Consultants: Oncology Palliative care PT OT SLP   Procedures: None   Antibiotics: Ceftriaxone  1/13-15 Azithromycin  1/13-15  30 Day Unplanned Readmission Risk Score    Flowsheet Row ED to Hosp-Admission (Current) from 01/06/2024 in [facility]  30 Day Unplanned Readmission Risk Score (%) 15.93 Filed at 01/08/2024 0801       This score is the patient's risk of an unplanned readmission within 30 days of being discharged (0 -100%). The score is based on dignosis, age, lab data, medications, orders, and past utilization.   Low:  0-14.9   Medium: 15-21.9   High: 22-29.9   Extreme: 30 and above           Subjective: Walked fairly well yesterday but he was far more winded today with brief ambulation, dizzy with brief exertion.   Objective: Vitals:   01/08/24 0457 01/08/24 1312  BP: (!) 136/58 127/62  Pulse: 99 94  Resp: 16 16  Temp: 97.9 F (36.6 C) 98 F (36.7 C)  SpO2: 91% 95%  Intake/Output Summary (Last 24 hours) at 01/08/2024 1634 Last data filed at 01/08/2024 1504 Gross per 24 hour  Intake 1132.79 ml  Output --  Net 1132.79 ml   Filed Weights   01/06/24 0826  Weight: 79.1 kg    Exam:  General:  Appears calm and comfortable and is in NAD, on RA Eyes:  EOMI,  normal lids, iris ENT:  grossly normal hearing, lips & tongue, mmm Neck:  no LAD, masses or thyromegaly Cardiovascular:  RRR, no m/r/g. No LE edema.  Respiratory:   CTA bilaterally with no wheezes/rales/rhonchi.  Normal respiratory effort. Abdomen:  soft, NT, ND Skin:  no rash or induration seen on limited exam Musculoskeletal:  grossly normal tone BUE/BLE, good ROM, no bony abnormality Psychiatric:  blunted mood and affect, speech fluent and appropriate, AOx3 Neurologic:  CN 2-12 grossly intact, moves all extremities in coordinated fashion  Data Reviewed: I have reviewed the patient's lab results since admission.  Pertinent labs for today include:   Na++ 133, stable CO2 18 Calcium  6.9 (corrected 8.1) Albumin 2.5 WBC 5.6 Hgb 7.2 Blood cultures NTD x 2 days    Family Communication: None present, reports that his husband recently left  Disposition: Status is: Inpatient Remains inpatient appropriate because: ongoing evaluation and management     Time spent: 50 minutes  Unresulted Labs (From admission, onward)     Start     Ordered   01/09/24 0500  CBC with Differential/Platelet  Tomorrow morning,   R       Question:  Specimen collection method  Answer:  Lab=Lab collect   01/08/24 1633   01/09/24 0500  Basic metabolic panel  Tomorrow morning,   R       Question:  Specimen collection method  Answer:  Lab=Lab collect   01/08/24 1633             Author: [name], MD 01/08/2024 4:34 PM  For on call review [email or web address].

## 2024-01-08 NOTE — Progress Notes (Signed)
 Mobility Specialist - Progress Note   01/08/24 1123  Mobility  Activity Ambulated with assistance in hallway  Level of Assistance Standby assist, set-up cues, supervision of patient - no hands on  Assistive Device Other (Comment) (IV Pole)  Distance Ambulated (ft) 125 ft  Activity Response Tolerated well  Mobility Referral Yes  Mobility visit 1 Mobility  Mobility Specialist Start Time (ACUTE ONLY) 1102  Mobility Specialist Stop Time (ACUTE ONLY) 1121  Mobility Specialist Time Calculation (min) (ACUTE ONLY) 19 min   Pt received in bed and agreeable to mobility. After ambulating, ~119ft pt c/o feeling dizzy. Wheeled patient back into room. BP checked & noted below. No other complaints during session. Pt to bed after session with all needs met.     Post-mobility: 125/66 BP  Chief Technology Officer

## 2024-01-08 NOTE — TOC Initial Note (Addendum)
 Transition of Care [facility]) - Initial/Assessment Note    Patient Details  Name: Frederick Ponce MRN: [id number] Date of Birth: Aug 06, 1946  Transition of Care [facility]) CM/SW Contact:    Loreda Rodriguez, RN Phone [phone]  01/08/2024, 11:27 AM  Clinical Narrative:                 Patient from home where he normally functions independently. Patient has PT recommendations for [facility] PT. Patient is agreeable HH PT. CM has provided patient with medicare.gov list for choice. Patient does not have a preference for home health agency. HH referral has been accepted by [place] with [facility]. AVS has been updated.     Barriers to Discharge: Continued Medical Work up   Patient Goals and CMS Choice            Expected Discharge Plan and Services                                              Prior Living Arrangements/Services                       Activities of Daily Living   ADL Screening (condition at time of admission) Independently performs ADLs?: Yes (appropriate for developmental age) Is the patient deaf or have difficulty hearing?: No Does the patient have difficulty seeing, even when wearing glasses/contacts?: No Does the patient have difficulty concentrating, remembering, or making decisions?: No  Permission Sought/Granted                  Emotional Assessment              Admission diagnosis:  Peripheral edema [R60.0] CAP (community acquired pneumonia) [J18.9] Intractable pain [R52] Malignant neoplasm metastatic to both lungs (HCC) [C78.01, C78.02] Community acquired pneumonia of left lower lobe of lung [J18.9] Patient Active Problem List   Diagnosis Date Noted   Failure to thrive in adult 01/07/2024   Chronic pain 01/07/2024   History of basal cell carcinoma 01/06/2024   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   Essential hypertension 08/09/2023   Overweight with body mass index (BMI) of 26 to 26.9 in adult 08/09/2023    Polyneuropathy 08/09/2023   Basal cell carcinoma, forehead 02/20/2023   Genetic testing 12/21/2022   Family history of prostate cancer 12/05/2022   Family history of melanoma 12/05/2022   COVID-19 08/29/2022   Acute encephalopathy 07/22/2022   Acute prerenal azotemia 07/22/2022   GAD (generalized anxiety disorder) 07/22/2022   Overdose 07/22/2022   Basal cell carcinoma (BCC) of left shoulder 06/07/2022   Overweight 01/24/2022   HLD (hyperlipidemia) 01/24/2022   Parkinson's disease (HCC) 01/24/2022   Prostate 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 (HCC) 01/18/2022   Iliac bone pain 12/27/2021   Dupuytren's disease 07/01/2017   PCP:  Susanna Epley, FNP Pharmacy:   [facility] [id number] [place], [place] - [address] [place] [address] Phone: [phone] Fax: [phone]  [place] - [facility] [address] [place] [address] Phone: [phone] Fax: [phone]  [facility] [place], [place] - [address] [place] [address] Phone: [phone] Fax: [phone]     Social Drivers of Health (SDOH) Social History: SDOH Screenings   Food Insecurity: No Food Insecurity (01/06/2024)  Housing: Low Risk  (  01/06/2024)  Transportation Needs: No Transportation Needs (01/06/2024)  Utilities: Not At Risk (01/06/2024)  Alcohol  Screen: Low Risk  (10/21/2023)  Depression (PHQ2-9): Low Risk  (10/21/2023)  Recent Concern: Depression (PHQ2-9) - Medium Risk (08/09/2023)  Financial Resource Strain: Low Risk  (08/09/2023)  Physical Activity: Unknown (08/09/2023)  Recent Concern: Physical Activity - Inactive (08/09/2023)  Social Connections: Moderately Isolated (01/06/2024)  Stress: No Stress Concern Present (08/09/2023)  Tobacco Use: Low Risk  (01/06/2024)   SDOH Interventions:     Readmission Risk Interventions     No data to display

## 2024-01-08 NOTE — Progress Notes (Signed)
 Initial Nutrition Assessment  DOCUMENTATION CODES:   Non-severe (moderate) malnutrition in context of chronic illness  INTERVENTION:   -Magic Cup BID -Encouraged pt to add gravies and butter to food to aid in swallowing -Continue multivitamin as appropriate  NUTRITION DIAGNOSIS:   Moderate Malnutrition related to chronic illness as evidenced by mild fat depletion, mild muscle depletion.  GOAL:   Patient will meet greater than or equal to 90% of their needs   MONITOR:   PO intake, Supplement acceptance  REASON FOR ASSESSMENT:   Consult Assessment of nutrition requirement/status  ASSESSMENT:   Pt with PMH of prostate 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 (2023), radiation therapy, Parkinson's, HLD, HTN, depression. Pt presented to ED with weakness and generalized body aches for the past 4 days.    Pt lives at home with partner. Palliative care has been consulted.  Pt on regular diet. Pt reports appetite being good. He reports liking the meals from the hospital. Pt reports loving to cook at home with his partner. He reports having 3 meals a day. For breakfast he will have jimmy dean meals, fruit, or oatmeal. For lunch he usually has leftovers and has lean cuisine's 3 times a week. For dinner he will usually cook. He reports dinner usually including fish and vegetables from his garden. Pt reports no recent weight loss and a usual body weight from 170-173 lbs. Pt reports not liking the taste of nutrition supplements like Ensure but has agreed to try a magic cup with his meals while being here. Pt reports having dry mouth due to the cancer treatment. Pt reports the lack of saliva effects his swallowing capabilities sometimes but will stick to foods he knows will not bother him. Pt was open to adding a side of gravy and/or butter to his meal trays to help aid his swallowing difficulties.   Meds: multivitamin (Prosight) daily, IV calcium  gluconate   Labs: Glucose 106, potassium 3.1, A1C  5.9  NUTRITION - FOCUSED PHYSICAL EXAM:  Flowsheet Row Most Recent Value  Orbital Region No depletion  Upper Arm Region Mild depletion  Thoracic and Lumbar Region Mild depletion  Buccal Region No depletion  Temple Region No depletion  Clavicle Bone Region No depletion  Clavicle and Acromion Bone Region Mild depletion  Scapular Bone Region No depletion  Dorsal Hand Mild depletion  Patellar Region No depletion  Anterior Thigh Region No depletion  Posterior Calf Region No depletion  Edema (RD Assessment) Mild  [lower extremities]  Hair Reviewed  Eyes Reviewed  Mouth Reviewed  [dry mouth from cancer treatment]  Skin Reviewed  Nails Reviewed       Diet Order:   Diet Order             Diet regular Room service appropriate? Yes; Fluid consistency: Thin  Diet effective now                   EDUCATION NEEDS:   Education needs have been addressed  Skin:  Skin Assessment: Reviewed RN Assessment  Last BM:  1/13  Height:   Ht Readings from Last 1 Encounters:  01/06/24 5\' 8"  (1.727 m)    Weight:   Wt Readings from Last 1 Encounters:  01/06/24 79.1 kg    Ideal Body Weight:   154 lb  BMI:  Body mass index is 26.51 kg/m.  Estimated Nutritional Needs:   Kcal:  2050-2250  Protein:  100-110  Fluid:  2.05-2.25 L    Dessie Flow, MS Dietetic Intern

## 2024-01-08 NOTE — Progress Notes (Signed)
 BSE completed, full report to follow.  Pt reports significant issues with secretions at this time impacting his swallowing ability.  He chronically clears his throat and demonstrates coughing across all po trials - most notably with thin liquids.  After sequential swallowing of liquids overt coughing noted with patient reporting he was worried he was going to bring his pills and needed extra time before attempting more po.   He does endorse h/o sensing liquid stasis pointing to distal pharynx and impaired clearance of pills through esophagus.  Patient reports he takes some pills and then will wait some time to take his others due to sensation of decreased esophageal clearance, even prior to initiating radiation.  Patient with some baseline esophageal dysphagia due to his Parkinson's.   SLP provided patient with liquid that was slightly thicker to determine if improved his comfort, this appeared to diminish coughing but he advised he did not enjoy the texture of it and would not want to consume this.  Voice is strong and clear and speech does not mimic typical rapid rate of imprecise articulation seen with patients with Parkinson's.    Suspect primary deficit is esophageal for which he is attempting to compensate.  Provided patient with esophagitis due to radiation compensation strategies in writing and verbally.    Given patient reports xerostomia, SLP advised that he start all intake with liquids preferably water given its pH neutral.  Advised patient hopeful for swallow function to improve over time after radiation completed to which he stated "it is not going to get better".    He verbalized that he was told this was "a last ditch effort for his treatment" causing SLP to suspect he is speaking of his cancer and medical condition overall.  Encouraged water intake also to potentially help diminish viscosity of secretions.    Encouraged patient to maintain strength of cough and expectoration though he  does report coughing is uncomfortable at this time.  No SLP follow-up indicated at this time as all education for compensations conducted.  Thank you so much for this consult.  Maudie Sorrow, MS [facility] SLP Acute [employer] [phone]

## 2024-01-08 NOTE — Plan of Care (Signed)

## 2024-01-08 NOTE — Evaluation (Addendum)
 Clinical/Bedside Swallow Evaluation Patient Details  Name: Frederick Ponce MRN: [id number] Date of Birth: 05/18/1946  Today's Date: 01/08/2024 Time: SLP Start Time (ACUTE ONLY): 0943 SLP Stop Time (ACUTE ONLY): 1020 SLP Time Calculation (min) (ACUTE ONLY): 37 min  Past Medical History:  Past Medical History:  Diagnosis Date   Arthritis    Depression    Family history of melanoma    Family history of prostate cancer    GERD (gastroesophageal reflux disease)    Hyperlipidemia    Hypertension    Parkinson disease (HCC)    Prostate cancer (HCC)    Past Surgical History: History reviewed. No pertinent surgical history. HPI:  78 yo male with h/o :dementia and metatstic prostate cancer - recently undergoing XRT for lung mets - admitted with poor intake,myalgias and found to have pneumonitis vs pna.  Pt has h/o Parkinsonism. He reports issues with mucus and dysphagia.  Per notes drinks ETOH 15 drinks a week.   CT chest showed No evidence of pulmonary embolism. Patchy consolidation, ground glass opacities; osseous mets,  Findings are favored to reflect disease progression with superimposed pneumonia and/or radiation pneumonitis. Swallow eval ordered.    Assessment / Plan / Recommendation  Clinical Impression  Pt reports significant issues with secretions at this time impacting his swallowing ability.  He chronically clears his throat and demonstrates coughing across all po trials - most notably with thin liquids.  After sequential swallowing of liquids overt coughing noted with patient reporting he was worried he was going to bring his pills and needed extra time before attempting more po.      He does endorse h/o sensing liquid stasis pointing to distal pharynx and impaired clearance of pills through esophagus.  Patient reports he takes some pills and then will wait some time to take his others due to sensation of decreased esophageal clearance, even prior to initiating radiation.  Patient with  some baseline esophageal dysphagia due to his Parkinson's.        SLP provided patient with liquid that was slightly thicker to determine if improved his comfort, this appeared to diminish coughing but he advised he did not enjoy the texture of it and would not want to consume this.  Voice is strong and clear and speech does not mimic typical rapid rate of imprecise articulation seen with patients with Parkinson's.       Suspect primary deficit is esophageal for which he is attempting to compensate.    Provided patient with esophagitis due to radiation compensation strategies in writing and verbally.        Given patient reports xerostomia, SLP advised that he start all intake with liquids preferably water given its pH neutral.  Advised he eat small frequent meals and maximize protein intake. Hopeful for swallow function to improve over time after radiation completed to which he stated "it is not going to get better".         He verbalized that he was told this was "a last ditch effort for his treatment" causing SLP to suspect he is speaking of his cancer and medical condition overall.  Encouraged water intake also to potentially help diminish viscosity of secretions.       Encouraged patient to maintain strength of cough and expectoration though he does report coughing is uncomfortable at this time.  No SLP follow-up indicated at this time as all education for compensations conducted.   Recommend maximize medications for odynophagia and secretion management. (? Magic mouthwash with lidocaine )  Thank you so much for this consult.    SLP Visit Diagnosis: Dysphagia, unspecified (R13.10)    Aspiration Risk  Mild aspiration risk    Diet Recommendation Regular;Thin liquid    Liquid Administration via: Straw;Cup Medication Administration: Whole meds with liquid Supervision: Patient able to self feed Compensations: Slow rate;Small sips/bites;Other (Comment) (start intake with liquids) Postural  Changes: Seated upright at 90 degrees;Remain upright for at least 30 minutes after po intake    Other  Recommendations Oral Care Recommendations: Oral care BID    Recommendations for follow up therapy are one component of a multi-disciplinary discharge planning process, led by the attending physician.  Recommendations may be updated based on patient status, additional functional criteria and insurance authorization.  Follow up Recommendations No SLP follow up      Assistance Recommended at Discharge  N/a  Functional Status Assessment Patient has had a recent decline in their functional status and demonstrates the ability to make significant improvements in function in a reasonable and predictable amount of time.  Frequency and Duration      N/a      Prognosis    N/a    Swallow Study   General Date of Onset: 01/07/24 HPI: 78 yo male with h/o :dementia and metatstic prostate cancer - recently undergoing XRT for lung mets - admitted with poor intake,myalgias and found to have pneumonitis vs pna.  Pt has h/o Parkinsonism. He reports issues with mucus and dysphagia.  Per notes drinks ETOH 15 drinks a week.   CT chest showed No evidence of pulmonary embolism. Patchy consolidation, ground glass opacities; osseous mets,  Findings are favored to reflect disease progression with superimposed pneumonia and/or radiation pneumonitis. Swallow eval ordered. Type of Study: Bedside Swallow Evaluation Previous Swallow Assessment: n/a Diet Prior to this Study: Regular;Thin liquids (Level 0) Temperature Spikes Noted: No Respiratory Status: Room air History of Recent Intubation: No Behavior/Cognition: Alert;Cooperative;Pleasant mood Oral Cavity Assessment: Other (comment) (appearance of single potential blister on palate left- pt denies discomfort) Oral Care Completed by SLP: No Oral Cavity - Dentition: Adequate natural dentition Vision: Functional for self-feeding Self-Feeding Abilities: Able to feed  self Patient Positioning: Upright in chair Baseline Vocal Quality: Normal Volitional Cough: Strong (albeit uncomfortable) Volitional Swallow: Able to elicit       Oral/Motor/Sensory Function Overall Oral Motor/Sensory Function: Within functional limits   Ice Chips Ice chips: Not tested   Thin Liquid Thin Liquid: Impaired Presentation: Cup;Self Fed;Straw Pharyngeal  Phase Impairments: Cough - Delayed;Throat Clearing - Immediate    Nectar Thick Nectar Thick Liquid: Impaired Presentation: Cup;Self Fed Pharyngeal Phase Impairments: Throat Clearing - Delayed;Cough - Delayed   Honey Thick Honey Thick Liquid: Not tested   Puree Puree: Within functional limits Presentation: Self Fed;Spoon   Solid     Solid: Within functional limits Presentation: Self Fed      Chantal Comment 01/08/2024,11:29 AM  Maudie Sorrow, MS [facility] SLP Acute Rehab Services Office [phone]

## 2024-01-08 NOTE — Progress Notes (Signed)
   01/08/24 1037  TOC Brief Assessment  Insurance and Status Reviewed  Patient has primary care physician Yes Sulema Endo, [place], FNP)  Home environment has been reviewed Home with spouse  Prior level of function: [employer]  Social Drivers of Health Review SDOH reviewed no interventions necessary  Readmission risk has been reviewed Yes  Transition of care needs no transition of care needs at this time

## 2024-01-09 DIAGNOSIS — E440 Moderate protein-calorie malnutrition: Secondary | ICD-10-CM | POA: Insufficient documentation

## 2024-01-09 DIAGNOSIS — R627 Adult failure to thrive: Secondary | ICD-10-CM | POA: Diagnosis not present

## 2024-01-09 DIAGNOSIS — G893 Neoplasm related pain (acute) (chronic): Secondary | ICD-10-CM

## 2024-01-09 LAB — CBC WITH DIFFERENTIAL/PLATELET
Abs Immature Granulocytes: 0.02 10*3/uL (ref 0.00–0.07)
Basophils Absolute: 0 10*3/uL (ref 0.0–0.1)
Basophils Relative: 0 %
Eosinophils Absolute: 0.1 10*3/uL (ref 0.0–0.5)
Eosinophils Relative: 2 %
HCT: 24.4 % — ABNORMAL LOW (ref 39.0–52.0)
Hemoglobin: 7.8 g/dL — ABNORMAL LOW (ref 13.0–17.0)
Immature Granulocytes: 0 %
Lymphocytes Relative: 9 %
Lymphs Abs: 0.5 10*3/uL — ABNORMAL LOW (ref 0.7–4.0)
MCH: 34.2 pg — ABNORMAL HIGH (ref 26.0–34.0)
MCHC: 32 g/dL (ref 30.0–36.0)
MCV: 107 fL — ABNORMAL HIGH (ref 80.0–100.0)
Monocytes Absolute: 0.5 10*3/uL (ref 0.1–1.0)
Monocytes Relative: 9 %
Neutro Abs: 4.2 10*3/uL (ref 1.7–7.7)
Neutrophils Relative %: 80 %
Platelets: 280 10*3/uL (ref 150–400)
RBC: 2.28 MIL/uL — ABNORMAL LOW (ref 4.22–5.81)
RDW: 13.7 % (ref 11.5–15.5)
WBC: 5.3 10*3/uL (ref 4.0–10.5)
nRBC: 0 % (ref 0.0–0.2)

## 2024-01-09 LAB — BASIC METABOLIC PANEL
Anion gap: 9 (ref 5–15)
BUN: 7 mg/dL — ABNORMAL LOW (ref 8–23)
CO2: 19 mmol/L — ABNORMAL LOW (ref 22–32)
Calcium: 7.2 mg/dL — ABNORMAL LOW (ref 8.9–10.3)
Chloride: 104 mmol/L (ref 98–111)
Creatinine, Ser: 0.49 mg/dL — ABNORMAL LOW (ref 0.61–1.24)
GFR, Estimated: >60 mL/min (ref >60)
Glucose, Bld: 109 mg/dL — ABNORMAL HIGH (ref 70–99)
Potassium: 3.3 mmol/L — ABNORMAL LOW (ref 3.5–5.1)
Sodium: 132 mmol/L — ABNORMAL LOW (ref 135–145)

## 2024-01-09 MED ORDER — POLYVINYL ALCOHOL 1.4 % OP SOLN
1.0000 [drp] | OPHTHALMIC | Status: DC | PRN
  Filled 2024-01-09: qty 15

## 2024-01-09 MED ORDER — POTASSIUM CHLORIDE CRYS ER 20 MEQ PO TBCR
40.0000 meq | EXTENDED_RELEASE_TABLET | Freq: Once | ORAL | Status: AC
  Administered 2024-01-09: 40 meq via ORAL
  Filled 2024-01-09: qty 2

## 2024-01-09 NOTE — Plan of Care (Signed)

## 2024-01-09 NOTE — Progress Notes (Addendum)
Daily Progress Note   Patient Name: Frederick Ponce       Date: 01/09/2024 DOB: 03/20/1946  Age: 78 y.o. MRN#: [id number] Attending Physician: Jonah Blue, MD Primary Care Physician: Arnette Felts, FNP Admit Date: 01/06/2024  Reason for Consultation/Follow-up: Establishing goals of care  Patient Profile/HPI:  78 y.o. 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 CT scan indicated increase in lung nodules, and pneumonia vs radiation pneumonitis. Palliative medicine consulted for goals of care.    Subjective: Chart reviewed including labs, progress notes, imaging from this and previous encounters.  Met at bedside with patient, his spouse, and Dr. Ophelia Charter.  Frederick Ponce reports he was feeling better last night, but today is more fatigued, and having general malaise.  Fentanyl patch was started this morning at new dose. He hasn't taken any prn medication since yesterday morning. He notes that he could have used some last night, but he didn't ask for it. He strongly feels he does not want further Pluvicto injections.  We discussed options for home with hospice, vs home with PT and Palliative with eventual transition to hospice if he does not want further cancer treatment.  For now he would like to maximize hospitalization and complete treatment for possible pnuemonia.  They are not ready to make final decision about disposition today.  Emotional support provided- Frederick Ponce feels disappointed he is not recovering back to his baseline quickly- he expressed frustration with himself and his  circumstances. We discussed concept of giving himself grace and allowing time for his body to rest.  He also expressed concern re: not receiving his sinemet as scheduled. On review it is ordered correctly- but possibly not being given at correct times.   Review of Systems  Constitutional:  Positive for malaise/fatigue.  Cardiovascular:  Positive for leg swelling.     Physical Exam Vitals and nursing note reviewed.  Constitutional:      General: He is not in acute distress.    Appearance: He is ill-appearing.  Cardiovascular:     Rate and Rhythm: Normal rate.  Pulmonary:     Effort: Pulmonary effort is normal.  Neurological:  Mental Status: He is alert and oriented to person, place, and time.             Vital Signs: BP (!) 142/65 (BP Location: Right Arm)   Pulse (!) 103   Temp 98.5 F (36.9 C) (Oral)   Resp 18   Ht 5\' 8"  (1.727 m)   Wt 79.1 kg   SpO2 96%   BMI 26.51 kg/m  SpO2: SpO2: 96 % O2 Device: O2 Device: Room Air O2 Flow Rate:    Intake/output summary:  Intake/Output Summary (Last 24 hours) at 01/09/2024 1109 Last data filed at 01/09/2024 0800 Gross per 24 hour  Intake 893.47 ml  Output 0 ml  Net 893.47 ml   LBM: Last BM Date : 01/08/24 Baseline Weight: Weight: 79.1 kg Most recent weight: Weight: 79.1 kg       Palliative Assessment/Data: PPS: 50%      Patient Active Problem List   Diagnosis Date Noted   Malnutrition of moderate degree 01/09/2024   Failure to thrive in adult 01/07/2024   Chronic pain 01/07/2024   History of basal cell carcinoma 01/06/2024   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   Essential hypertension 08/09/2023   Overweight with body mass index (BMI) of 26 to 26.9 in adult 08/09/2023   Polyneuropathy 08/09/2023   Basal cell carcinoma, forehead 02/20/2023   Genetic testing 12/21/2022   Family history of prostate cancer 12/05/2022   Family history of melanoma 12/05/2022   COVID-19 08/29/2022   Acute encephalopathy  07/22/2022   Acute prerenal azotemia 07/22/2022   GAD (generalized anxiety disorder) 07/22/2022   Overdose 07/22/2022   Basal cell carcinoma (BCC) of left shoulder 06/07/2022   Overweight 01/24/2022   HLD (hyperlipidemia) 01/24/2022   Parkinson's disease (HCC) 01/24/2022   Prostate 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 (HCC) 01/18/2022   Iliac bone pain 12/27/2021   Dupuytren's disease 07/01/2017    Palliative Care Assessment & Plan    Assessment/Recommendations/Plan  Continue current pain regimen- encouraged Frederick Ponce to request prn medication when pain begins to start- not wait until it is severe He will likely benefit from low dose dexamethasone daily (would start with 1mg  in morning and 1mg  with lunch)- but will defer until he has completed treatment for possible pnuemonia Frederick Ponce and Frederick Hua will continue to discuss and consider their options for care outside the hospital. Will discuss with nursing the importance of giving sinemet as scheduled    Code Status: DNR  Prognosis:  Unable to determine  Discharge Planning: To Be Determined  Care plan was discussed with patient family and care team.   Thank you for allowing the Palliative Medicine Team to assist in the care of this patient.  Total time:  60 minutes Prolonged billing:  Time includes:   Preparing to see the patient (e.g., review of tests) Obtaining and/or reviewing separately obtained history Performing a medically necessary appropriate examination and/or evaluation Counseling and educating the patient/family/caregiver Ordering medications, tests, or procedures Referring and communicating with other health care professionals (when not reported separately) Documenting clinical information in the electronic or other health record Independently interpreting results (not reported separately) and communicating results to the patient/family/caregiver Care coordination (not reported separately) Clinical documentation  Ocie Bob,  AGNP-C Palliative Medicine   Please contact Palliative Medicine Team phone at [phone] for questions and concerns.

## 2024-01-09 NOTE — Progress Notes (Signed)
Progress Note   Patient: Frederick Ponce XBM:[id number] DOB: [birth year] DOA: 01/06/2024     3 DOS: the patient was seen and examined on 01/09/2024   Brief hospital course: 77yo with h/o depression, prostate CA, HLD, and Parkinson's dementia who presented on 1/13 with myalgias.  CT with superimposed PNA and/or radiation pneumonitis.  He is on RA.  He was started on Ceftriaxone and Azithromycin.  Appears to be more consistent with pneumonitis, imaging with progressive metastatic disease.  Oncology, palliative care consulted.  FTT - PT/OT/SLP/nutrition consults.  Assessment and Plan:  Failure to Thrive Patient presented with generalized weakness and myalgias in the setting of metastatic castrate-resistant prostate CA Admitted to telemetry -> med surg Unremarkable evaluation thus far other than possible PNA vs. Radiation pneumonitis (see below) and progressive metastatic disease PT/OT/SLP/nutrition evaluations Palliative care is consulting for goals of care as well as symptom management   CAP vs. Radiation pneumonitis CTA shows disease progression with possible superimposed pneumonia and/or radiation pneumonitis Also with an enlarging lung nodule (13 x 7 mm, previously 8-9 x 7 mm), concerning for recurrent metastasis Respiratory status is not labored and he remains on RA Given low suspicion for infection, antibiotics stopped Will add guaifenesin 1200 mL p.o. twice daily prn, flutter valve and incentive spirometry  Dysphagia He reports some difficulty with swallowing SLP consulted given h/o CA + Parkinson's and is concerned about radiation esophagitis Added magic mouthwash with lidocaine   HLD Hold Atorvastatin 40 mg for now given Generalized Weakness  Parkinson's Disease  Continue Carbidopa-Levodopa 25-100 mg po 4 times daily   Metastatic Castrate Resistant Prostate Cancer CT with markedly progressive mediastinal and L hilar LAD, consistent with worsening metastatic  disease Requested that Dr. Leonides Schanz consult on 1/14; he met with the patient and messaged me saying that he recommends seeing how he does post-abx and reconsidering another Pluvicto treatment vs. Hospice in 1-2 weeks Sees Dr. Amil Amen of Nuclear Medicine and has had 1 treatment of Pluvicto thus far   Chronic Pain On Fentanyl Patch 50 mcg q72h and MSIR 15 mg q4h prn at time of admission Added Hydromorphone 1 mg IV q4hprn Severe Pain Palliative care consulted and is also helping with symptom control Increased fentanyl patch to 75 mcg q48h with continued MSIR and prn Dilaudid   Elevated D-Dimer D-Dimer was 3.14 CTA PE negative LE Venous Duplex also negative Suspect that this is related to his ongoing malignancy   Hyponatremia Given a 1 Liter bolus and MIVF with NS at 75 mL/hr  Also with hypocalcemia, given correction  Recheck BMP in AM   Macrocytic Anemia Stable, likely related to cancer Will follow He seems unlikely to benefit from transfusion at this time given his lack of tachycardia, DOE, hypoxia - but this could be considered in the future   HTN Continue amlodipine  LE edema 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 Patient reports onset immediately following Pluvicto infusion He reports pain in his legs and they are quite erythematous Will add Unna boots  Goals of care Patient and his husband are very concerned about the effects he has experienced thus far that they attribute to Pluvicto - and they are currently opposed to additional doses However, he would like to go home with home PT/OT and so is considering home with palliative care rather than hospice at this time He may be appropriate for home in the next 1-2 days       Consultants: Oncology Palliative care PT OT SLP  Procedures: None   Antibiotics: Ceftriaxone 1/13-15 Azithromycin 1/13-15   30 Day Unplanned Readmission Risk Score    Flowsheet Row ED to Hosp-Admission (Current) from 01/06/2024  in [place] [facility]  30 Day Unplanned Readmission Risk Score (%) 16.16 Filed at 01/09/2024 0801       This score is the patient's risk of an unplanned readmission within 30 days of being discharged (0 -100%). The score is based on dignosis, age, lab data, medications, orders, and past utilization.   Low:  0-14.9   Medium: 15-21.9   High: 22-29.9   Extreme: 30 and above           Subjective: Felt better yesterday but he had a rough night and does not feel well today.  He is very torn about the path ahead and whether he is ready to stop treatment and transition to hospice.   Objective: Vitals:   01/09/24 0811 01/09/24 1443  BP: (!) 142/65 135/67  Pulse: (!) 103 (!) 106  Resp: 18 16  Temp: 98.5 F (36.9 C) 97.9 F (36.6 C)  SpO2: 96% 97%    Intake/Output Summary (Last 24 hours) at 01/09/2024 1607 Last data filed at 01/09/2024 1100 Gross per 24 hour  Intake 720 ml  Output 0 ml  Net 720 ml   Filed Weights   01/06/24 0826  Weight: 79.1 kg    Exam:  General:  Appears calm and comfortable and is in NAD, on RA Eyes:  EOMI, normal lids, iris ENT:  grossly normal hearing, lips & tongue, mmm Neck:  no LAD, masses or thyromegaly Cardiovascular:  RRR, no m/r/g. 2-3+ LE edema.  Respiratory:   CTA bilaterally with no wheezes/rales/rhonchi.  Normal respiratory effort. Abdomen:  soft, NT, ND Skin:  B lower leg erythema with edema Musculoskeletal:  grossly normal tone BUE/BLE, good ROM, no bony abnormality Psychiatric:  blunted mood and affect, speech fluent and appropriate, AOx3 Neurologic:  CN 2-12 grossly intact, moves all extremities in coordinated fashion  Data Reviewed: I have reviewed the patient's lab results since admission.  Pertinent labs for today include:   Na++ 132 - stable K+ 3.3 CO2 19 - stable WBC 5.3 Hgb 7.8 - stable     Family Communication: Husband was present for evaluation  Disposition: Status is: Inpatient Remains inpatient  appropriate because: ongoing GOC discussions     Time spent: 50 minutes  Unresulted Labs (From admission, onward)     Start     Ordered   01/10/24 0500  CBC with Differential/Platelet  Tomorrow morning,   R       Question:  Specimen collection method  Answer:  Lab=Lab collect   01/09/24 1607   01/10/24 0500  Basic metabolic panel  Tomorrow morning,   R       Question:  Specimen collection method  Answer:  Lab=Lab collect   01/09/24 1607             Author: Jonah Blue, MD 01/09/2024 4:07 PM  For on call review [email or web address].

## 2024-01-09 NOTE — Telephone Encounter (Signed)
Oral Oncology Patient Advocate Encounter  Therapy discontinued. Application has been closed.  Jinger Neighbors, CPhT-Adv Oncology Pharmacy Patient Advocate [facility] Direct Number: [phone]  Fax: [phone]

## 2024-01-09 NOTE — Progress Notes (Signed)
Physical Therapy Treatment Patient Details Name: Frederick Ponce MRN: [id number] DOB: 05-22-1946 Today's Date: 01/09/2024   History of Present Illness Patient is a 78 year old male who presented on 1/13 with weakness and body aches. Patient was admitted with CAP, generalized weakness and diffuse body aches in setting of pluvicto. 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    PT Comments  General Comments: AxO x 3 very pleasant and willing but limited by B LE pain and dizziness after taking IV Dilaudid. General bed mobility comments: able to self rise OOB but required assist to support B LE back onto bed due to increased c/o pain. General Gait Details: used a walker this session due to gait instbility in room and c/o B LE pain.  Pt c/o increased fatigue today.  Pt felt like he was "not getting better" cause walker was suggested.  Educated on safety and "situational use" when needed.  Pt present with limited amb distance this session due to increased c/o B LE then dizziness (just received IV Dilaudid).  Assisted back to bed. Ortho Tech arrived to wrapped B LE.  LPT has rec D/C back home with HH.    If plan is discharge home, recommend the following: A little help with walking and/or transfers;A little help with bathing/dressing/bathroom;Assistance with cooking/housework;Assist for transportation;Help with stairs or ramp for entrance   Can travel by private vehicle        Equipment Recommendations  Rolling walker (2 wheels)    Recommendations for Other Services       Precautions / Restrictions Precautions Precautions: Fall Restrictions Weight Bearing Restrictions Per Provider Order: No     Mobility  Bed Mobility Overal bed mobility: Needs Assistance Bed Mobility: Supine to Sit     Supine to sit: Supervision, HOB elevated, Used rails Sit to supine: Min assist, HOB elevated,  Used rails   General bed mobility comments: able to self rise OOB but required assist to support B LE back onto bed due to increased c/o pain.    Transfers Overall transfer level: Needs assistance   Transfers: Sit to/from Stand Sit to Stand: Supervision           General transfer comment: for safety    Ambulation/Gait Ambulation/Gait assistance: Min assist, Mod assist Gait Distance (Feet): 28 Feet Assistive device: Rolling walker (2 wheels) Gait Pattern/deviations: Step-through pattern, Decreased step length - right, Decreased step length - left Gait velocity: decreased     General Gait Details: used a walker this session due to gait instbility in room and c/o B LE pain.  Pt felt like he was "not getting better" cause walker was suggested.  Educated on safety and "situational use" when needed.  Pt present with limited amb distance this session due to increased c/o B LE then dizziness (just received IV Dilaudid).  Assisted back to bed.   Stairs             Wheelchair Mobility     Tilt Bed    Modified Rankin (Stroke Patients Only)       Balance                                            Cognition Arousal: Alert Behavior During Therapy: WFL for tasks assessed/performed Overall Cognitive Status: Within Functional Limits for  tasks assessed                                 General Comments: AxO x 3 very pleasant and willing but limited by B LE pain and dizziness after taking IV Dilaudid.        Exercises      General Comments        Pertinent Vitals/Pain Pain Assessment Pain Assessment: Faces Faces Pain Scale: Hurts little more Pain Location: B LE Pain Descriptors / Indicators: Grimacing, Throbbing Pain Intervention(s): Monitored during session, Premedicated before session, Repositioned    Home Living                          Prior Function            PT Goals (current goals can now be found in the  care plan section) Progress towards PT goals: Progressing toward goals    Frequency    Min 1X/week      PT Plan      Co-evaluation              AM-PAC PT "6 Clicks" Mobility   Outcome Measure  Help needed turning from your back to your side while in a flat bed without using bedrails?: A Little Help needed moving from lying on your back to sitting on the side of a flat bed without using bedrails?: A Little Help needed moving to and from a bed to a chair (including a wheelchair)?: A Little Help needed standing up from a chair using your arms (e.g., wheelchair or bedside chair)?: A Little Help needed to walk in hospital room?: A Little Help needed climbing 3-5 steps with a railing? : A Lot 6 Click Score: 17    End of Session Equipment Utilized During Treatment: Gait belt Activity Tolerance: Patient limited by pain;Other (comment) (dizziness) Patient left: in bed;with call bell/phone within reach;with bed alarm set Nurse Communication: Mobility status PT Visit Diagnosis: Muscle weakness (generalized) (M62.81);Difficulty in walking, not elsewhere classified (R26.2)     Time: 1520-1530 PT Time Calculation (min) (ACUTE ONLY): 10 min  Charges:    $Gait Training: 8-22 mins PT General Charges $$ ACUTE PT VISIT: 1 Visit                     Felecia Shelling  PTA Acute  Rehabilitation Services Office M-F          [phone]

## 2024-01-09 NOTE — Progress Notes (Signed)
Orthopedic Tech Progress Note Patient Details:  Frederick Ponce [birth year] [id number]  Ortho Devices Type of Ortho Device: Roland Rack boot Ortho Device/Splint Location: bi lat unna boot applied Ortho Device/Splint Interventions: Ordered, Application, Adjustment   Post Interventions Patient Tolerated: Well Instructions Provided: Adjustment of device, Care of device  Kizzie Fantasia 01/09/2024, 3:42 PM

## 2024-01-10 ENCOUNTER — Other Ambulatory Visit (HOSPITAL_COMMUNITY): Payer: Self-pay

## 2024-01-10 ENCOUNTER — Other Ambulatory Visit: Payer: Self-pay

## 2024-01-10 ENCOUNTER — Encounter: Payer: Self-pay | Admitting: Hematology and Oncology

## 2024-01-10 DIAGNOSIS — R627 Adult failure to thrive: Secondary | ICD-10-CM | POA: Diagnosis not present

## 2024-01-10 LAB — BASIC METABOLIC PANEL
Anion gap: 8 (ref 5–15)
BUN: 7 mg/dL — ABNORMAL LOW (ref 8–23)
CO2: 20 mmol/L — ABNORMAL LOW (ref 22–32)
Calcium: 7.3 mg/dL — ABNORMAL LOW (ref 8.9–10.3)
Chloride: 107 mmol/L (ref 98–111)
Creatinine, Ser: 0.6 mg/dL — ABNORMAL LOW (ref 0.61–1.24)
GFR, Estimated: >60 mL/min (ref >60)
Glucose, Bld: 113 mg/dL — ABNORMAL HIGH (ref 70–99)
Potassium: 3.7 mmol/L (ref 3.5–5.1)
Sodium: 135 mmol/L (ref 135–145)

## 2024-01-10 LAB — CBC WITH DIFFERENTIAL/PLATELET
Abs Immature Granulocytes: 0.05 10*3/uL (ref 0.00–0.07)
Basophils Absolute: 0 10*3/uL (ref 0.0–0.1)
Basophils Relative: 0 %
Eosinophils Absolute: 0 10*3/uL (ref 0.0–0.5)
Eosinophils Relative: 1 %
HCT: 25.1 % — ABNORMAL LOW (ref 39.0–52.0)
Hemoglobin: 7.9 g/dL — ABNORMAL LOW (ref 13.0–17.0)
Immature Granulocytes: 1 %
Lymphocytes Relative: 6 %
Lymphs Abs: 0.4 10*3/uL — ABNORMAL LOW (ref 0.7–4.0)
MCH: 33.8 pg (ref 26.0–34.0)
MCHC: 31.5 g/dL (ref 30.0–36.0)
MCV: 107.3 fL — ABNORMAL HIGH (ref 80.0–100.0)
Monocytes Absolute: 0.7 10*3/uL (ref 0.1–1.0)
Monocytes Relative: 10 %
Neutro Abs: 5.6 10*3/uL (ref 1.7–7.7)
Neutrophils Relative %: 82 %
Platelets: 301 10*3/uL (ref 150–400)
RBC: 2.34 MIL/uL — ABNORMAL LOW (ref 4.22–5.81)
RDW: 13.7 % (ref 11.5–15.5)
WBC: 6.8 10*3/uL (ref 4.0–10.5)
nRBC: 0 % (ref 0.0–0.2)

## 2024-01-10 MED ORDER — ONDANSETRON HCL 4 MG PO TABS
4.0000 mg | ORAL_TABLET | Freq: Four times a day (QID) | ORAL | 0 refills | Status: DC | PRN
  Filled 2024-01-10: qty 20, 5d supply, fill #0

## 2024-01-10 MED ORDER — FENTANYL 75 MCG/HR TD PT72
1.0000 | MEDICATED_PATCH | TRANSDERMAL | 0 refills | Status: DC
  Filled 2024-01-10: qty 5, 10d supply, fill #0

## 2024-01-10 MED ORDER — MAGIC MOUTHWASH W/LIDOCAINE
10.0000 mL | Freq: Four times a day (QID) | ORAL | 0 refills | Status: DC | PRN
  Filled 2024-01-10: qty 400, 10d supply, fill #0

## 2024-01-10 MED ORDER — MORPHINE SULFATE 15 MG PO TABS
15.0000 mg | ORAL_TABLET | ORAL | 0 refills | Status: DC | PRN
  Filled 2024-01-10: qty 30, 5d supply, fill #0

## 2024-01-10 MED ORDER — CARBIDOPA-LEVODOPA 25-100 MG PO TABS
1.0000 | ORAL_TABLET | Freq: Four times a day (QID) | ORAL | Status: DC
Start: 2024-01-10 — End: 2024-01-10
  Administered 2024-01-10 (×2): 1 via ORAL
  Filled 2024-01-10 (×2): qty 1

## 2024-01-10 MED ORDER — GUAIFENESIN ER 600 MG PO TB12
1200.0000 mg | ORAL_TABLET | Freq: Two times a day (BID) | ORAL | 0 refills | Status: DC | PRN
  Filled 2024-01-10: qty 60, 15d supply, fill #0

## 2024-01-10 NOTE — Plan of Care (Signed)

## 2024-01-10 NOTE — Discharge Summary (Addendum)
Physician Discharge Summary   Patient: Frederick Ponce MRN: [id number] DOB: May 11, 1946  Admit date:     01/06/2024  Discharge date: 01/10/24  Discharge Physician: Jonah Blue   PCP: Arnette Felts, FNP   Recommendations at discharge:   You are being discharged home with home PT Continue to discuss with family, oncology, palliative care about long-term plans and whether to transition to hospice vs. Consider Pluvicto Follow up with NP Christell Constant in 1-2 weeks  Discharge Diagnoses: Principal Problem:   Failure to thrive in adult Active Problems:   Prostate 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 (HCC)   HLD (hyperlipidemia)   Parkinson's disease (HCC)   Essential hypertension   Post-radiation pneumonitis (HCC)   Chronic pain   Malnutrition of moderate degree   Hospital Course: 77yo with h/o depression, prostate CA, HLD, and Parkinson's dementia who presented on 1/13 with myalgias.  CT with superimposed PNA and/or radiation pneumonitis.  He is on RA.  He was started on Ceftriaxone and Azithromycin.  Appears to be more consistent with pneumonitis, imaging with progressive metastatic disease.  Oncology, palliative care consulted.  FTT - PT/OT/SLP/nutrition consults.  Assessment and Plan:  Failure to Thrive Patient presented with generalized weakness and myalgias in the setting of metastatic castrate-resistant prostate CA Admitted to telemetry -> med surg Unremarkable evaluation thus far other than possible PNA vs. Radiation pneumonitis (see below) and progressive metastatic disease PT/OT/SLP/nutrition evaluations Palliative care is consulting for goals of care as well as symptom management Will discharge to home today with home health PT, as per patient request   CAP vs. Radiation pneumonitis CTA shows disease progression with possible superimposed pneumonia and/or radiation pneumonitis Also with an enlarging lung nodule (13 x 7 mm, previously 8-9 x 7 mm), concerning for recurrent  metastasis Respiratory status is not labored and he remains on RA Given low suspicion for infection, antibiotics stopped   Dysphagia He reports some difficulty with swallowing SLP consulted given h/o CA + Parkinson's and is concerned about radiation esophagitis Added magic mouthwash with lidocaine   HLD Hold Atorvastatin 40 mg for now given Generalized Weakness  Parkinson's Disease  Continue Carbidopa-Levodopa 25-100 mg po 4 times daily   Metastatic Castrate Resistant Prostate Cancer CT with markedly progressive mediastinal and L hilar LAD, consistent with worsening metastatic disease Requested that Dr. Leonides Schanz consult on 1/14; he met with the patient and messaged me saying that he recommends seeing how he does post-abx and reconsidering another Pluvicto treatment vs. Hospice in 1-2 weeks Sees Dr. Amil Amen of Nuclear Medicine and has had 1 treatment of Pluvicto thus far   Chronic Pain On Fentanyl Patch 50 mcg q72h and MSIR 15 mg q4h prn at time of admission Added Hydromorphone 1 mg IV q4hprn Severe Pain Palliative care consulted and is also helping with symptom control Increased fentanyl patch to 75 mcg q48h with continued MSIR and prn Dilaudid PDMP reviewed   Elevated D-Dimer D-Dimer was 3.14 CTA PE negative LE Venous Duplex also negative Suspect that this is related to his ongoing malignancy   Hyponatremia Given a 1 Liter bolus and MIVF with NS at 75 mL/hr  Also with hypocalcemia, given correction  Recheck BMP in AM   Macrocytic Anemia Stable, likely related to cancer Will follow He seems unlikely to benefit from transfusion at this time given his lack of tachycardia, DOE, hypoxia - but this could be considered in the future   HTN Continue amlodipine   LE edema 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 Patient  reports onset immediately following Pluvicto infusion He reports pain in his legs and they are quite erythematous Will add Unna  boots  Malnutrition Nutrition Problem: Moderate Malnutrition Etiology: chronic illness Signs/Symptoms: mild fat depletion, mild muscle depletion Interventions: Magic cup   Goals of care Patient and his husband are very concerned about the effects he has experienced thus far that they attribute to Pluvicto - and they are currently opposed to additional doses However, he would like to go home with home PT/OT and so is considering home with palliative care rather than hospice at this time Will dc to home today       Consultants: Oncology Palliative care PT OT SLP   Procedures: None   Antibiotics: Ceftriaxone 1/13-15 Azithromycin 1/13-15   Pain control - [facility] Controlled Substance Reporting System database was reviewed. and patient was instructed, not to drive, operate heavy machinery, perform activities at heights, swimming or participation in water activities or provide baby-sitting services while on Pain, Sleep and Anxiety Medications; until their outpatient Physician has advised to do so again. Also recommended to not to take more than prescribed Pain, Sleep and Anxiety Medications.   Disposition: Home Diet recommendation:  Regular diet DISCHARGE MEDICATION: Allergies as of 01/10/2024       Reactions   Gabapentin Other (See Comments)   Restless legs/confusion; patient denies symptoms and ask that this be removed.   Celebrex [celecoxib] Rash        Medication List     STOP taking these medications    fentaNYL 50 MCG/HR Commonly known as: DURAGESIC Replaced by: fentaNYL 75 MCG/HR       TAKE these medications    acetaminophen 500 MG tablet Commonly known as: TYLENOL Take 1,000 mg by mouth every 6 (six) hours as needed for mild pain (pain score 1-3) or moderate pain (pain score 4-6).   amLODipine 5 MG tablet Commonly known as: NORVASC Take 1 tablet (5 mg total) by mouth daily.   atorvastatin 40 MG tablet Commonly known as: LIPITOR TAKE 1  TABLET BY MOUTH EVERY MORNING   carbidopa-levodopa 25-100 MG tablet Commonly known as: Sinemet Take 1 tablet by mouth 4 (four) times daily.   COENZYME Q10 PO Take 1 capsule by mouth every morning.   fentaNYL 75 MCG/HR Commonly known as: DURAGESIC Place 1 patch onto the skin every other day. Start taking on: January 11, 2024 Replaces: fentaNYL 50 MCG/HR   guaiFENesin 600 MG 12 hr tablet Commonly known as: MUCINEX Take 2 tablets (1,200 mg total) by mouth 2 (two) times daily as needed for cough or to loosen phlegm.   ibuprofen 200 MG tablet Commonly known as: ADVIL Take 400 mg by mouth every 6 (six) hours as needed for mild pain (pain score 1-3) or moderate pain (pain score 4-6).   KRILL OIL PO Take 1 capsule by mouth every morning.   magic mouthwash w/lidocaine Soln Take 10 mLs by mouth 4 (four) times daily as needed for mouth pain. Suspension contains equal amounts of Maalox Extra Strength, nystatin, diphenhydramine and lidocaine.   morphine 15 MG tablet Commonly known as: MSIR Take 1 tablet (15 mg total) by mouth every 4 (four) hours as needed for moderate pain (pain score 4-6). What changed:  when to take this reasons to take this   multivitamin-lutein Caps capsule Take 1 capsule by mouth every morning.   ondansetron 4 MG tablet Commonly known as: ZOFRAN Take 1 tablet (4 mg total) by mouth every 6 (six) hours as needed  for nausea.        Follow-up Information     [facility] Follow up.   Specialty: Home Health Services Why: Your home health has been set up with [facility]. The office will call you with start of service information. If you have questions or concerns you can call the number listed above. Contact information: [address] [place] [address] [phone]                Discharge Exam:   Subjective: Still feels tired.  Thinks it is reasonable to go home, but he is concerned about the future and how things will  go.   Objective: Vitals:   01/09/24 2307 01/10/24 0458  BP: (!) 127/59 136/61  Pulse: 95 (!) 103  Resp: 16 16  Temp: 98.9 F (37.2 C) 99.6 F (37.6 C)  SpO2: 96% 96%    Intake/Output Summary (Last 24 hours) at 01/10/2024 1538 Last data filed at 01/10/2024 1300 Gross per 24 hour  Intake 120 ml  Output --  Net 120 ml   Filed Weights   01/06/24 0826  Weight: 79.1 kg    Exam:  General:  Appears calm and comfortable and is in NAD, on RA Eyes:  EOMI, normal lids, iris ENT:  grossly normal hearing, lips & tongue, mmm Neck:  no LAD, masses or thyromegaly Cardiovascular:  RRR, no m/r/g. 2-3+ LE edema.  Respiratory:   CTA bilaterally with no wheezes/rales/rhonchi.  Normal respiratory effort. Abdomen:  soft, NT, ND Skin:  B lower leg erythema with edema Musculoskeletal:  grossly normal tone BUE/BLE, good ROM, no bony abnormality Psychiatric:  blunted mood and affect, speech fluent and appropriate, AOx3 Neurologic:  CN 2-12 grossly intact, moves all extremities in coordinated fashion  Data Reviewed: I have reviewed the patient's lab results since admission.  Pertinent labs for today include:   Glucose 113 BUN 7/Creatinine 0.6/GFR >60 WBC 6.8 Hgb 7.9 - stable    Condition at discharge: stable  The results of significant diagnostics from this hospitalization (including imaging, microbiology, ancillary and laboratory) are listed below for reference.   Imaging Studies: VAS [place] LOWER EXTREMITY VENOUS (DVT) (7a-7p) Result Date: 01/06/2024  Lower Venous DVT Study Patient Name:  SAMUELA DERINGER  Date of Exam:   01/06/2024 Medical Rec #: [id number]           Accession #:    [id number] Date of Birth: Apr 05, 1946           Patient Gender: M Patient Age:   5 years Exam Location:  [facility] Procedure:      VAS [place] LOWER EXTREMITY VENOUS (DVT) Referring Phys: JULIE HAVILAND --------------------------------------------------------------------------------  Indications: Edema.   Risk Factors: Metastatic prostate cancer. Comparison Study: No previosu exams Performing Technologist: Jody Hill RVT, RDMS  Examination Guidelines: A complete evaluation includes B-mode imaging, spectral Doppler, color Doppler, and power Doppler as needed of all accessible portions of each vessel. Bilateral testing is considered an integral part of a complete examination. Limited examinations for reoccurring indications may be performed as noted. The reflux portion of the exam is performed with the patient in reverse Trendelenburg.  +---------+---------------+---------+-----------+----------+--------------+ RIGHT    CompressibilityPhasicitySpontaneityPropertiesThrombus Aging +---------+---------------+---------+-----------+----------+--------------+ CFV      Full           Yes      Yes                                 +---------+---------------+---------+-----------+----------+--------------+  SFJ      Full                                                        +---------+---------------+---------+-----------+----------+--------------+ FV Prox  Full           Yes      Yes                                 +---------+---------------+---------+-----------+----------+--------------+ FV Mid   Full           Yes      Yes                                 +---------+---------------+---------+-----------+----------+--------------+ FV DistalFull           Yes      Yes                                 +---------+---------------+---------+-----------+----------+--------------+ PFV      Full                                                        +---------+---------------+---------+-----------+----------+--------------+ POP      Full           Yes      Yes                                 +---------+---------------+---------+-----------+----------+--------------+ PTV      Full                                                         +---------+---------------+---------+-----------+----------+--------------+ PERO     Full                                                        +---------+---------------+---------+-----------+----------+--------------+   +---------+---------------+---------+-----------+----------+--------------+ LEFT     CompressibilityPhasicitySpontaneityPropertiesThrombus Aging +---------+---------------+---------+-----------+----------+--------------+ CFV      Full           Yes      Yes                                 +---------+---------------+---------+-----------+----------+--------------+ SFJ      Full                                                        +---------+---------------+---------+-----------+----------+--------------+  FV Prox  Full           Yes      Yes                                 +---------+---------------+---------+-----------+----------+--------------+ FV Mid   Full           Yes      Yes                                 +---------+---------------+---------+-----------+----------+--------------+ FV DistalFull           Yes      Yes                                 +---------+---------------+---------+-----------+----------+--------------+ PFV      Full                                                        +---------+---------------+---------+-----------+----------+--------------+ POP      Full           Yes      Yes                                 +---------+---------------+---------+-----------+----------+--------------+ PTV      Full                                                        +---------+---------------+---------+-----------+----------+--------------+ PERO     Full                                                        +---------+---------------+---------+-----------+----------+--------------+     Summary: BILATERAL: - No evidence of deep vein thrombosis seen in the lower extremities, bilaterally. -No evidence of  popliteal cyst, bilaterally. -Subcutaneous edema of calf and ankles, bilaterally.   *See table(s) above for measurements and observations. Electronically signed by Sherald Hess MD on 01/06/2024 at 1:53:57 PM.    Final    CT Angio Chest PE W and/or Wo Contrast Result Date: 01/06/2024 CLINICAL DATA:  Body aches for the past 4 days. Recently started radiation therapy for prostate cancer. EXAM: CT ANGIOGRAPHY CHEST WITH CONTRAST TECHNIQUE: Multidetector CT imaging of the chest was performed using the standard protocol during bolus administration of intravenous contrast. Multiplanar CT image reconstructions and MIPs were obtained to evaluate the vascular anatomy. RADIATION DOSE REDUCTION: This exam was performed according to the departmental dose-optimization program which includes automated exposure control, adjustment of the mA and/or kV according to patient size and/or use of iterative reconstruction technique. CONTRAST:  75mL OMNIPAQUE IOHEXOL 350 MG/ML SOLN COMPARISON:  Chest x-ray from same day. PET-CT dated November 04, 2023. CT chest dated July 18, 2023. FINDINGS: Cardiovascular: Satisfactory opacification of the pulmonary arteries  to the segmental level. No evidence of pulmonary embolism. Unchanged mild cardiomegaly. No pericardial effusion. No thoracic aortic aneurysm or dissection. Coronary, aortic arch, and branch vessel atherosclerotic vascular disease. Mediastinum/Nodes: Markedly progressive mediastinal and left hilar lymphadenopathy. Conglomerate prevascular lymphadenopathy measuring up to 1.8 cm in short axis. No enlarged axillary lymph nodes. Thyroid gland, trachea, and esophagus demonstrate no significant findings. Lungs/Pleura: Patchy consolidation, peribronchovascular nodularity, ground-glass density, and interlobular septal thickening in the left lower lobe. The pulmonary metastasis in this area appears larger, although it is difficult to delineate given superimposed parenchymal process. Trace  to small left pleural effusion. No pneumothorax. 13 x 7 mm irregular spiculated nodule in the anterior left upper lobe (series 12, image 67, previously 8 x 5 mm on prior PET, and 9 x 7 mm on prior chest CT. There was no abnormal uptake in this area on the PET scan. Upper Abdomen: No acute abnormality. Unchanged right renal calculi and pelviectasis. Musculoskeletal: No chest wall abnormality. Sclerotic lesions involving the T7 vertebral body, left T7 transverse process, and left first and eighth ribs are unchanged. Review of the MIP images confirms the above findings. IMPRESSION: 1. No evidence of pulmonary embolism. 2. Patchy consolidation, peribronchovascular nodularity, ground-glass density, and interlobular septal thickening in the left lower lobe. The pulmonary metastasis in this area appears larger, although it is difficult to delineate given superimposed parenchymal process. Findings are favored to reflect disease progression with superimposed pneumonia and/or radiation pneumonitis. 3. Markedly progressive mediastinal and left hilar lymphadenopathy, consistent with worsening metastatic disease. 4. 13 x 7 mm irregular spiculated nodule in the anterior left upper lobe, previously 8 x 5 mm on prior PET, and 9 x 7 mm on prior chest CT. There was no abnormal uptake in this area on the PET scan, but given other findings on today's study, is concerning for recurrent metastasis. 5. Unchanged treated osseous metastatic disease. 6.  Aortic Atherosclerosis (ICD10-I70.0). Electronically Signed   By: Obie Dredge M.D.   On: 01/06/2024 12:48   DG Chest Portable 1 View Result Date: 01/06/2024 CLINICAL DATA:  78 year old male with cough. History of prostate cancer. EXAM: PORTABLE CHEST 1 VIEW COMPARISON:  CT Chest, Abdomen, and Pelvis 07/18/2023 and earlier. FINDINGS: Portable AP view at 0925 hours. Lung volumes and mediastinal contours have not significantly changed since 2023. Visualized tracheal air column is within  normal limits. No pneumothorax, pleural effusion, pulmonary edema. No confluent right lung opacity, but there is Patchy and confluent new left infrahilar opacity, superimposed over area of pulmonary metastasis on the CT last year. No acute or suspicious osseous lesion identified. Visible bowel-gas within normal limits. IMPRESSION: 1. Increased patchy and confluent left lung base opacity in an area of treated pulmonary metastatic disease previously. The appearance more resembles pneumonia than progressed metastases. No pleural effusion is evident. 2. Followup PA and lateral chest X-ray is recommended in 3-4 weeks following trial of antibiotic therapy to ensure resolution and exclude underlying malignancy. Electronically Signed   By: Odessa Fleming M.D.   On: 01/06/2024 09:58   NM PLUVICTO ADMINISTRATION Result Date: 01/02/2024 CLINICAL DATA:  78 year old male with metastatic castrate resistant prostate adenocarcinoma. PSMA positive metastatic disease PET scan 11/04/2023. EXAM: NUCLEAR MEDICINE PLUVICTO INJECTION TECHNIQUE: Infusion: The nuclear medicine technologist and I personally verified the dose activity to be delivered as specified in the written directive, and verified the patient identification via 2 separate methods. Initial flush of the intravenous catheter was performed was sterile saline. The dose syringe was connected to the  catheter and the Lu-177 Pluvicto administered over a 1 to 10 min infusion. Single 10 cc lushes with normal saline follow the dose. No complications were noted. The entire IV tubing, venocatheter, stopcock and syringes was removed in total, placed in a disposal bag and sent for assay of the residual activity, which will be reported at a later time in our EMR by the physics staff. Pressure was applied to the venipuncture site, and a compression bandage placed. Patient monitored for 1 hour following infusion. Radiation Safety personnel were present to perform the discharge survey, as  detailed on their documentation. After a short period of observation, the patient had his IV removed. RADIOPHARMACEUTICALS:  203.2 microcuries Lu-177 PLUVICTO FINDINGS: Current Infusion: 1 Planned Infusions: 6 Patient presented to nuclear medicine for treatment. The patient's most recent blood counts were reviewed and remains a good candidate to proceed with Lu-177 Pluvicto. PSA mildly increased to 39. The patient was situated in an infusion suite with a contact barrier placed under the arm. Intravenous access was established, using sterile technique, and a normal saline infusion from a syringe was started. Micro-dosimetry: The prescribed radiation activity was assayed and confirmed to be within specified tolerance. IMPRESSION: Current Infusion: 1 Planned Infusions: 6 The patient tolerated the infusion well. The patient will return in 6 weeks for ongoing care. Electronically Signed   By: Genevive Bi M.D.   On: 01/02/2024 15:56   NM Radiologist Eval And Mgmt Result Date: 12/12/2023 EXAM: NEW PATIENT OFFICE VISIT CHIEF COMPLAINT: Metastatic prostate carcinoma which is castrate resistant. Evidence of PSMA positive metastatic disease on PET scan. Current Pain Level: 1-10 HISTORY OF PRESENT ILLNESS: 78 year old male with metastatic prostate adenocarcinoma. Castrate resistant prostate carcinoma which is progressing on PARP inhibitor. PSA increased from 3 to 20 over the last month. Patient has PSMA positive metastatic disease within the skeleton and mediastinal lymph nodes by PET scan 08/16/2022. REVIEW OF SYSTEMS: See epic note PHYSICAL EXAMINATION: See epic note ASSESSMENT AND PLAN: See epic note Electronically Signed   By: Genevive Bi M.D.   On: 12/12/2023 11:35    Microbiology: Results for orders placed or performed during the hospital encounter of 01/06/24  Resp panel by RT-PCR (RSV, Flu A&B, Covid) Anterior Nasal Swab     Status: None   Collection Time: 01/06/24  8:59 AM   Specimen: Anterior  Nasal Swab  Result Value Ref Range Status   SARS Coronavirus 2 by RT PCR NEGATIVE NEGATIVE Final    Comment: (NOTE) SARS-CoV-2 target nucleic acids are NOT DETECTED.  The SARS-CoV-2 RNA is generally detectable in upper respiratory specimens during the acute phase of infection. The lowest concentration of SARS-CoV-2 viral copies this assay can detect is 138 copies/mL. A negative result does not preclude SARS-Cov-2 infection and should not be used as the sole basis for treatment or other patient management decisions. A negative result may occur with  improper specimen collection/handling, submission of specimen other than nasopharyngeal swab, presence of viral mutation(s) within the areas targeted by this assay, and inadequate number of viral copies(<138 copies/mL). A negative result must be combined with clinical observations, patient history, and epidemiological information. The expected result is Negative.  Fact Sheet for Patients:  BloggerCourse.com  Fact Sheet for Healthcare Providers:  SeriousBroker.it  This test is no t yet approved or cleared by the [place] FDA and  has been authorized for detection and/or diagnosis of SARS-CoV-2 by FDA under an Emergency Use Authorization (EUA). This EUA will remain  in effect (meaning  this test can be used) for the duration of the COVID-19 declaration under Section 564(b)(1) of the Act, 21 U.S.C.section 360bbb-3(b)(1), unless the authorization is terminated  or revoked sooner.       Influenza A by PCR NEGATIVE NEGATIVE Final   Influenza B by PCR NEGATIVE NEGATIVE Final    Comment: (NOTE) The Xpert Xpress SARS-CoV-2/FLU/RSV plus assay is intended as an aid in the diagnosis of influenza from Nasopharyngeal swab specimens and should not be used as a sole basis for treatment. Nasal washings and aspirates are unacceptable for Xpert Xpress SARS-CoV-2/FLU/RSV testing.  Fact Sheet for  Patients: BloggerCourse.com  Fact Sheet for Healthcare Providers: SeriousBroker.it  This test is not yet approved or cleared by the [place] FDA and has been authorized for detection and/or diagnosis of SARS-CoV-2 by FDA under an Emergency Use Authorization (EUA). This EUA will remain in effect (meaning this test can be used) for the duration of the COVID-19 declaration under Section 564(b)(1) of the Act, 21 U.S.C. section 360bbb-3(b)(1), unless the authorization is terminated or revoked.     Resp Syncytial Virus by PCR NEGATIVE NEGATIVE Final    Comment: (NOTE) Fact Sheet for Patients: BloggerCourse.com  Fact Sheet for Healthcare Providers: SeriousBroker.it  This test is not yet approved or cleared by the [place] FDA and has been authorized for detection and/or diagnosis of SARS-CoV-2 by FDA under an Emergency Use Authorization (EUA). This EUA will remain in effect (meaning this test can be used) for the duration of the COVID-19 declaration under Section 564(b)(1) of the Act, 21 U.S.C. section 360bbb-3(b)(1), unless the authorization is terminated or revoked.  Performed at [facility] [facility], [address] [place], [place] [address]   Culture, blood (routine x 2)     Status: None (Preliminary result)   Collection Time: 01/06/24 11:00 AM   Specimen: BLOOD  Result Value Ref Range Status   Specimen Description   Final    BLOOD SITE NOT SPECIFIED Performed at [facility], [address], [place], [place] [address]    Special Requests   Final    BOTTLES DRAWN AEROBIC AND ANAEROBIC Blood Culture results may not be optimal due to an inadequate volume of blood received in culture bottles Performed at [facility], [address] [place], [place] [address]    Culture   Final    NO GROWTH 4 DAYS Performed at [facility], [address], [place], [place] [address]    Report Status PENDING  Incomplete  Culture, blood (routine x 2)     Status: None (Preliminary result)   Collection Time: 01/06/24  5:28 PM   Specimen: BLOOD  Result Value Ref Range Status   Specimen Description   Final    BLOOD BLOOD LEFT ARM Performed at [facility], [address], [place], [place] [address]    Special Requests   Final    BOTTLES DRAWN AEROBIC ONLY Blood Culture results may not be optimal due to an inadequate volume of blood received in culture bottles Performed at [facility], [address] [place], [place] [address]    Culture   Final    NO GROWTH 4 DAYS Performed at [facility], [address], [place], [place] [address]    Report Status PENDING  Incomplete    Labs: CBC: Recent Labs  Lab 01/07/24 0550 01/07/24 1008 01/08/24 0612 01/09/24 0540 01/10/24 0626  WBC 6.0 6.4 5.6 5.3 6.8  NEUTROABS 4.8 5.0 4.5 4.2 5.6  HGB 7.3* 8.2* 7.2* 7.8* 7.9*  HCT 23.4* 25.8* 22.9* 24.4* 25.1*  MCV 109.9* 107.5* 107.0* 107.0* 107.3*  PLT 246 294 266 280 301   Basic Metabolic Panel: Recent Labs  Lab 01/07/24 0550 01/07/24 1008 01/08/24 0612 01/09/24 0540 01/10/24 0626  NA 133* 131* 133* 132* 135  K 3.4* 3.6 3.1* 3.3* 3.7  CL 106 106 104 104 107  CO2 19* 19* 18* 19* 20*  GLUCOSE 107* 106* 106* 109* 113*  BUN 10 10 10  7* 7*  CREATININE 0.60* 0.64 0.65 0.49* 0.60*  CALCIUM 7.2* 7.2* 6.9* 7.2* 7.3*  MG 2.2  --   --   --   --   PHOS 2.0*  --   --   --   --    Liver Function Tests: Recent Labs  Lab 01/06/24 0859 01/07/24 0550 01/08/24 0612  AST 15 11* 11*  ALT 7 6 6   ALKPHOS 53 47 44  BILITOT 0.6 0.8 0.7  PROT 6.3* 5.5* 5.2*  ALBUMIN 3.0* 2.7* 2.5*   CBG: No results for input(s): "GLUCAP" in the last 168 hours.  Discharge time spent: greater than 30 minutes.  Signed: Jonah Blue, MD [facility] 01/10/2024

## 2024-01-10 NOTE — Progress Notes (Signed)
Occupational Therapy Treatment Patient Details Name: Frederick Ponce MRN: [id number] DOB: Nov 05, 1946 Today's Date: 01/10/2024   History of present illness Patient is a 78 year old male who presented on 1/13 with weakness and body aches. Patient was admitted with CAP, generalized weakness and diffuse body aches in setting of pluvicto. 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   OT comments  Pt showing overall progress as evidenced by an increased score on 6-Clicks AM-PAC measure of occupational Performance with previous score of 18/24, and current score of 22/24. Pt not yet at baseline but agrees he is demonstrating adequate function in order to perform his BADLs at home with pt preferred 24/7 assistance from his partner who is a retired Engineer, civil (consulting).   All education has been completed and the patient has no further questions.  See below for any follow-up Occupational Therapy or equipment needs. OT is signing off. Thank you for this referral.        If plan is discharge home, recommend the following:  A little help with bathing/dressing/bathroom;Assistance with cooking/housework;Assist for transportation   Equipment Recommendations  None recommended by OT    Recommendations for Other Services      Precautions / Restrictions Precautions Precautions: Fall Restrictions Weight Bearing Restrictions Per Provider Order: No       Mobility Bed Mobility Overal bed mobility: Modified Independent                  Transfers                         Balance Overall balance assessment: Mild deficits observed, not formally tested                                         ADL either performed or assessed with clinical judgement   ADL Overall ADL's : At baseline Eating/Feeding: Modified independent;Sitting Eating/Feeding Details (indicate cue type and  reason): taking sips of water Grooming: Wash/dry hands;Standing;Oral care;Wash/dry face;Modified independent   Upper Body Bathing: Modified independent   Lower Body Bathing: Minimal assistance Lower Body Bathing Details (indicate cue type and reason): Pt reports that his partner with whom he lives is a retired Engineer, civil (consulting) and can assist with all LB ADLs. Pt's leg wrappings would require sink bathing for new and pt declined learnign long handled adaptive equipnment for ease due to having partner available. Upper Body Dressing : Set up;Standing   Lower Body Dressing: Minimal assistance Lower Body Dressing Details (indicate cue type and reason): Pt reports partner at home to assist. Toilet Transfer: Supervision/safety Toilet Transfer Details (indicate cue type and reason): Would likely be Mod I if pt agreed to use a RW but pt declining even when he3 began furniture cruising and describing LB weakness. Pt ed that RW could service to increase standing and ambulation tolerance but pt declined. Toileting- Clothing Manipulation and Hygiene: Modified independent Toileting - Clothing Manipulation Details (indicate cue type and reason): Pt refused supervision for toileting due to modesty. Pt reports thaty he is able to perform all hygiene and demonstarted clothing management Mod I.     Functional mobility during ADLs: Supervision/safety;Modified independent      Extremity/Trunk Assessment              Vision   Vision Assessment?: No apparent  visual deficits   Perception     Praxis      Cognition Arousal: Alert Behavior During Therapy: WFL for tasks assessed/performed, Anxious Overall Cognitive Status: Within Functional Limits for tasks assessed                                 General Comments: Has his own way of doing things, but receptive to feedback.        Exercises      Shoulder Instructions       General Comments      Pertinent Vitals/ Pain       Pain  Assessment Pain Assessment: 0-10 Pain Score: 3  Pain Location: LT lateral LE Pain Intervention(s): Limited activity within patient's tolerance, Premedicated before session, Monitored during session, Repositioned  Home Living                                          Prior Functioning/Environment              Frequency           Progress Toward Goals  OT Goals(current goals can now be found in the care plan section)  Progress towards OT goals: Goals met/education completed, patient discharged from OT  Acute Rehab OT Goals Patient Stated Goal: Pt agreable to discontinue OT and focus on PT OT Goal Formulation: All assessment and education complete, DC therapy  Plan      Co-evaluation                 AM-PAC OT "6 Clicks" Daily Activity     Outcome Measure   Help from another person eating meals?: None Help from another person taking care of personal grooming?: None Help from another person toileting, which includes using toliet, bedpan, or urinal?: None Help from another person bathing (including washing, rinsing, drying)?: A Little Help from another person to put on and taking off regular upper body clothing?: None Help from another person to put on and taking off regular lower body clothing?: A Little 6 Click Score: 22    End of Session Equipment Utilized During Treatment: Gait belt  OT Visit Diagnosis: Other abnormalities of gait and mobility (R26.89)   Activity Tolerance Patient tolerated treatment well   Patient Left in chair;with call bell/phone within reach;Other (comment)   Nurse Communication Mobility status        Time: [phone] OT Time Calculation (min): 23 min  Charges: OT General Charges $OT Visit: 1 Visit OT Treatments $Self Care/Home Management : 8-22 mins $Therapeutic Activity: 8-22 mins  Victorino Dike, OT Acute Rehab Services Office: [phone] 01/10/2024   Theodoro Clock 01/10/2024, 10:17 AM

## 2024-01-10 NOTE — Progress Notes (Signed)
Daily Progress Note   Patient Name: Frederick Ponce       Date: 01/10/2024 DOB: 13-Feb-1946  Age: 78 y.o. MRN#: [id number] Attending Physician: Jonah Blue, MD Primary Care Physician: Arnette Felts, FNP Admit Date: 01/06/2024  Reason for Consultation/Follow-up: Establishing goals of care  Patient Profile/HPI:  78 y.o. male with past medical history of depression with suicide attempt, Parkinson's, HLD, prostate cancer s/p multiple treatments- castrate resistant and metastatic to lung and bone- received Pluvectin injection on 1/9 admitted on 01/06/2024 with generalized weakness and pain. CT scan indicated increase in lung nodules, and pneumonia vs radiation pneumonitis. Palliative medicine consulted for goals of care.   Subjective: Chart reviewed including labs, progress notes, imaging from this and previous encounters.  Patient reports feeling pain is well controlled. He continues to feel fatigued. We discussed that fatigue is an expected side effect from the Pluvicto and hopefully he will regain some energy as his body works through the most recent injection.  He is hoping for discharge today. Not ready for hospice as he would like to have some physical therapy at home, however, he is clear he would not want another injection of Pluvicto.   Review of Systems  Constitutional:  Positive for malaise/fatigue.  Cardiovascular:  Positive for leg swelling.     Physical Exam Vitals and nursing note reviewed.  Pulmonary:     Effort: Pulmonary effort is normal.  Skin:    Comments: flushed  Neurological:     Mental Status: He is alert and oriented to person, place, and time.             Vital Signs: BP 136/61 (BP Location: Left Arm)   Pulse (!) 103   Temp 99.6 F (37.6 C) (Oral)   Resp  16   Ht 5\' 8"  (1.727 m)   Wt 79.1 kg   SpO2 96%   BMI 26.51 kg/m  SpO2: SpO2: 96 % O2 Device: O2 Device: Room Air O2 Flow Rate:    Intake/output summary: No intake or output data in the 24 hours ending 01/10/24 1152 LBM: Last BM Date : 01/09/24 Baseline Weight: Weight: 79.1 kg Most recent weight: Weight: 79.1 kg       Palliative Assessment/Data: PPS: 50%      Patient Active Problem List   Diagnosis Date Noted  Malnutrition of moderate degree 01/09/2024   Failure to thrive in adult 01/07/2024   Chronic pain 01/07/2024   History of basal cell carcinoma 01/06/2024   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   Essential hypertension 08/09/2023   Overweight with body mass index (BMI) of 26 to 26.9 in adult 08/09/2023   Polyneuropathy 08/09/2023   Basal cell carcinoma, forehead 02/20/2023   Genetic testing 12/21/2022   Family history of prostate cancer 12/05/2022   Family history of melanoma 12/05/2022   COVID-19 08/29/2022   Acute encephalopathy 07/22/2022   Acute prerenal azotemia 07/22/2022   GAD (generalized anxiety disorder) 07/22/2022   Overdose 07/22/2022   Basal cell carcinoma (BCC) of left shoulder 06/07/2022   Overweight 01/24/2022   HLD (hyperlipidemia) 01/24/2022   Parkinson's disease (HCC) 01/24/2022   Prostate 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 (HCC) 01/18/2022   Iliac bone pain 12/27/2021   Dupuytren's disease 07/01/2017    Palliative Care Assessment & Plan    Assessment/Recommendations/Plan  Metastatic prostate cancer:  Symptom management:  Continue fentanyl transdermal patch q48 hrs Continue acetaminophen 1000mg  TID po Continue MSIR 15mg  q 4 hours as needed po Will defer starting dexamethasone since he is hoping for discharge today- may benefit from being started outpatient for bone pain and fatigue Goals of care:  Does not want further cancer directed therapy Wishes for some PT at home Followup with [name], NP Palliative at [facility] and eventual  transition to hospice after physical therapy completed   Code Status: DNR  Prognosis:  < 6 months  Discharge Planning: Home with Home Health  Care plan was discussed with patient, his spouse, and care team.  Thank you for allowing the Palliative Medicine Team to assist in the care of this patient.  Total time:  60 minutes Prolonged billing:  Time includes:   Preparing to see the patient (e.g., review of tests) Obtaining and/or reviewing separately obtained history Performing a medically necessary appropriate examination and/or evaluation Counseling and educating the patient/family/caregiver Ordering medications, tests, or procedures Referring and communicating with other health care professionals (when not reported separately) Documenting clinical information in the electronic or other health record Independently interpreting results (not reported separately) and communicating results to the patient/family/caregiver Care coordination (not reported separately) Clinical documentation  Ocie Bob, AGNP-C Palliative Medicine   Please contact Palliative Medicine Team phone at [phone] for questions and concerns.

## 2024-01-10 NOTE — Care Management Important Message (Signed)
Important Message  Patient Details IM Letter given Name: Frederick Ponce MRN: [id number] Date of Birth: [birth year]   Important Message Given:  Yes - Medicare IM     Caren Macadam 01/10/2024, 2:08 PM

## 2024-01-11 ENCOUNTER — Other Ambulatory Visit (HOSPITAL_COMMUNITY): Payer: Self-pay

## 2024-01-11 LAB — CULTURE, BLOOD (ROUTINE X 2)
Culture: NO GROWTH
Culture: NO GROWTH

## 2024-01-13 ENCOUNTER — Other Ambulatory Visit (HOSPITAL_COMMUNITY): Payer: Self-pay

## 2024-01-14 ENCOUNTER — Other Ambulatory Visit (HOSPITAL_COMMUNITY): Payer: Self-pay

## 2024-01-15 ENCOUNTER — Encounter: Payer: Self-pay | Admitting: General Practice

## 2024-01-15 ENCOUNTER — Telehealth: Payer: Medicare Other

## 2024-01-15 NOTE — Progress Notes (Signed)
[facility] Spiritual Care Note  Left voicemail of care and support, encouraging return call if helpful.   [address] [place], [place], [facility] Pager [phone] Voicemail [phone]

## 2024-01-17 ENCOUNTER — Other Ambulatory Visit: Payer: Self-pay | Admitting: Nurse Practitioner

## 2024-01-17 DIAGNOSIS — Z515 Encounter for palliative care: Secondary | ICD-10-CM

## 2024-01-17 DIAGNOSIS — C7951 Secondary malignant neoplasm of bone: Secondary | ICD-10-CM

## 2024-01-17 DIAGNOSIS — G893 Neoplasm related pain (acute) (chronic): Secondary | ICD-10-CM

## 2024-01-17 MED ORDER — FENTANYL 25 MCG/HR TD PT72: 1.0000 | MEDICATED_PATCH | TRANSDERMAL | 0 refills | Status: DC

## 2024-01-22 ENCOUNTER — Ambulatory Visit: Payer: Medicare Other | Admitting: Nurse Practitioner

## 2024-01-24 NOTE — Progress Notes (Signed)
 Palliative Medicine [facility]  Telephone:(336) [phone] Fax:([phone]) [phone]   Name: Frederick Ponce Date: 01/24/2024 MRN: [id number]  DOB: 07/22/1946  Patient Care Team: Georgina Speaks, FNP as PCP - General (General Practice) Pickenpack-Cousar, Fannie SAILOR, NP as Nurse Practitioner (Nurse Practitioner) Linton, Macario HERO, LCSW as Social Worker (Licensed Clinical Social Worker) Patrcia Cough, MD as Consulting Physician (Radiation Oncology)   REASON FOR CONSULTATION: Frederick Ponce is a 78 y.o. male with medical history including Parkinson's disease and hyperlipidemia. 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 He is having urgent radiation appointment due to iliac bone pain. Palliative ask to see for symptom management.   SOCIAL HISTORY:     reports that he has never smoked. He has never used smokeless tobacco. He reports current alcohol  use of about 15.0 standard drinks of alcohol  per week. He reports that he does not use drugs.  ADVANCE DIRECTIVES:  Patient is scheduled to complete advanced directives at our clinic here at the cancer center on 02/16/2022. MOST form provided.  Patient plans to review and complete at a later time.  CODE STATUS:   PAST MEDICAL HISTORY: Past Medical History:  Diagnosis Date   Arthritis    Depression    Family history of melanoma    Family history of prostate cancer    GERD (gastroesophageal reflux disease)    Hyperlipidemia    Hypertension    Parkinson disease (HCC)    Prostate cancer (HCC)     PAST SURGICAL HISTORY: No past surgical history on file.  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  The report date is December 21, 2022.  The CancerNext-Expanded gene panel offered by [facility] and includes sequencing and rearrangement analysis for the following 77 genes: AIP, ALK, APC*, ATM*, AXIN2, BAP1, BARD1, BLM, BMPR1A, BRCA1*, BRCA2*, BRIP1*, CDC73, CDH1*, CDK4, CDKN1B, CDKN2A, CHEK2*, CTNNA1, DICER1, FANCC, FH, FLCN, GALNT12, KIF1B, LZTR1, MAX, MEN1, MET, MLH1*, MSH2*, MSH3, MSH6*, MUTYH*, NBN, NF1*, NF2, NTHL1, PALB2*, PHOX2B, PMS2*, POT1, PRKAR1A, PTCH1, PTEN*, RAD51C*, RAD51D*, RB1, RECQL, RET, SDHA, SDHAF2, SDHB, SDHC, SDHD, SMAD4, SMARCA4, SMARCB1, SMARCE1, STK11, SUFU, TMEM127, TP53*, TSC1, TSC2, VHL and XRCC2 (sequencing and deletion/duplication); EGFR, EGLN1, HOXB13, KIT, MITF, PDGFRA, POLD1, and POLE (sequencing only); EPCAM and GREM1 (deletion/duplication only). DNA and RNA analyses performed for * genes.      ALLERGIES:  is allergic to gabapentin  and celebrex  [celecoxib ].  MEDICATIONS:  Current Outpatient Medications  Medication Sig Dispense Refill   acetaminophen  (TYLENOL ) 500 MG tablet Take 1,000 mg by mouth every 6 (six) hours as needed for mild pain (pain score 1-3) or moderate pain (pain score 4-6).     amLODipine  (NORVASC ) 5 MG tablet Take 1 tablet (5 mg total) by mouth daily. 90 tablet 1   atorvastatin  (LIPITOR) 40 MG tablet TAKE 1 TABLET BY MOUTH EVERY MORNING 90 tablet 1   carbidopa -levodopa  (SINEMET ) 25-100 MG tablet Take 1 tablet by mouth 4 (  four) times daily. 360 tablet 3   COENZYME Q10 PO Take 1 capsule by mouth every morning.     fentaNYL  (DURAGESIC ) 25 MCG/HR Place 1 patch onto the skin every 3 (three) days. Apply one patch (25mcg) in addition to 50mcg patch to equal 75mcg. 10 patch 0    guaiFENesin  (MUCINEX ) 600 MG 12 hr tablet Take 2 tablets (1,200 mg total) by mouth 2 (two) times daily as needed for cough or to loosen phlegm. 60 tablet 0   ibuprofen  (ADVIL ) 200 MG tablet Take 400 mg by mouth every 6 (six) hours as needed for mild pain (pain score 1-3) or moderate pain (pain score 4-6).     KRILL OIL PO Take 1 capsule by mouth every morning.     magic mouthwash w/lidocaine  SOLN Take 10 mLs by mouth 4 (four) times daily as needed for mouth pain. Suspension contains equal amounts of Maalox Extra Strength, nystatin, diphenhydramine  and lidocaine . 400 mL 0   morphine  (MSIR) 15 MG tablet Take 1 tablet (15 mg total) by mouth every 4 (four) hours as needed for moderate pain (pain score 4-6). 30 tablet 0   multivitamin-lutein (OCUVITE-LUTEIN) CAPS capsule Take 1 capsule by mouth every morning.     ondansetron  (ZOFRAN ) 4 MG tablet Take 1 tablet (4 mg total) by mouth every 6 (six) hours as needed for nausea. 20 tablet 0   No current facility-administered medications for this visit.    PERFORMANCE STATUS (ECOG) : 1 - Symptomatic but completely ambulatory  Assessment NAD Normal breathing pattern RRR Bilateral leg redness and edema AAO x3  Discussed the use of AI scribe software for clinical note transcription with the patient, who gave verbal consent to proceed.   IMPRESSION:  [name] presents to clinic for symptom management follow-up.  He is accompanied by husband [name], who assists with his care. He was recently hospitalized for pneumonia and/or radiation pneumonitis. During this time imaging identified further disease progression. Patient seen by our palliative team and pain medications adjusted accordingly.   He has been experiencing significant fatigue since his recent hospitalization. Despite improvement in his lung condition, the fatigue remains severe. He feels extremely tired and is frustrated by the lack of improvement in his energy levels. Patient has a  long-standing issue with anemia and is curious about the results of his lab work scheduled for today. Advised we will continue to closely monitor. Given his complaints of significant fatigue and pale color on exam will add type and hold in case needed.   Frederick Ponce has been undergoing physical therapy, focusing on balance due to ongoing wooziness and swelling. [name] assists with wrapping his legs, which has been helpful in managing the swelling. Compression has improved the condition of his right leg, although he suspects the improvement might be occurring naturally. He mentions that sometimes his legs feel like they are burning. Will continue to closely monitor.   Interestingly, his hot flashes have not returned since his hospital stay and a radioactive infusion he received. He notes that the absence of hot flashes is unusual but has persisted since the treatment. Much appreciative of this resolution.   We discussed his pain at length. Pain currently managed on Fentanyl  75mcg patch and MS IR as needed for breakthrough. He is changing patch every 2 days. He states regimen works most of the time. Some days are better than others. He is also taking Tylenol  1000mg .  He prefers Tylenol  Arthritis, which he finds more effective than regular Tylenol . Advised  no changes to regimen at this time.   We will continue to closely monitor and support.  Assessment & Plan  Fatigue and Anemia Noted significant fatigue and pallor. Discussed potential need for transfusion. -Order labs today including CBC, CMP per oncology. -Consider type and hold for potential transfusion based on lab results.  Lower Extremity Edema Noted persistent lower extremity swelling despite compression therapy. No skin breakdown observed. Reports some improvement.  -Continue compression therapy. Husband is actively wrapping.  -Monitor for worsening edema or skin changes.  Cancer Related Pain Management Pain controlled on current regimen. No  changes to regimen at this time. -Continue current pain regimen. -Fentanyl  75mcg patch every 48 hours -MS IR 15mg  as needed for breakthrough pain -Tylenol  ES   Physical Therapy Recently started physical therapy focusing on balance. -Continue physical therapy as scheduled.  Follow-up Scheduled follow-up appointment later this week. -Attend follow-up appointment as scheduled. -Contact office if any changes or concerns arise. -I will plan to see patient back in 4-6 weeks sooner if needed.  Patient expressed understanding and was in agreement with this plan. He also understands that He can call the clinic at any time with any questions, concerns, or complaints.    Any controlled substances utilized were prescribed in the context of palliative care. PDMP has been reviewed.    Visit consisted of counseling and education dealing with the complex and emotionally intense issues of symptom management and palliative care in the setting of serious and potentially life-threatening illness.  Levon Borer, AGPCNP-BC  Palliative Medicine Team/[facility]

## 2024-01-28 ENCOUNTER — Inpatient Hospital Stay: Payer: Medicare Other | Attending: Nurse Practitioner

## 2024-01-28 ENCOUNTER — Inpatient Hospital Stay (HOSPITAL_BASED_OUTPATIENT_CLINIC_OR_DEPARTMENT_OTHER): Payer: Medicare Other | Admitting: Nurse Practitioner

## 2024-01-28 ENCOUNTER — Encounter: Payer: Self-pay | Admitting: Nurse Practitioner

## 2024-01-28 ENCOUNTER — Other Ambulatory Visit: Payer: Self-pay

## 2024-01-28 VITALS — BP 127/67 | HR 79 | Temp 98.2°F | Resp 16 | Wt 173.6 lb

## 2024-01-28 DIAGNOSIS — R530 Neoplastic (malignant) related fatigue: Secondary | ICD-10-CM | POA: Diagnosis not present

## 2024-01-28 DIAGNOSIS — C7951 Secondary malignant neoplasm of bone: Secondary | ICD-10-CM | POA: Insufficient documentation

## 2024-01-28 DIAGNOSIS — Z515 Encounter for palliative care: Secondary | ICD-10-CM

## 2024-01-28 DIAGNOSIS — G893 Neoplasm related pain (acute) (chronic): Secondary | ICD-10-CM | POA: Diagnosis not present

## 2024-01-28 DIAGNOSIS — C61 Malignant neoplasm of prostate: Secondary | ICD-10-CM | POA: Diagnosis not present

## 2024-01-28 DIAGNOSIS — Z923 Personal history of irradiation: Secondary | ICD-10-CM | POA: Insufficient documentation

## 2024-01-28 LAB — CBC WITH DIFFERENTIAL (CANCER CENTER ONLY)
Abs Immature Granulocytes: 0.01 10*3/uL (ref 0.00–0.07)
Basophils Absolute: 0 10*3/uL (ref 0.0–0.1)
Basophils Relative: 0 %
Eosinophils Absolute: 0.2 10*3/uL (ref 0.0–0.5)
Eosinophils Relative: 6 %
HCT: 28.1 % — ABNORMAL LOW (ref 39.0–52.0)
Hemoglobin: 8.7 g/dL — ABNORMAL LOW (ref 13.0–17.0)
Immature Granulocytes: 0 %
Lymphocytes Relative: 32 %
Lymphs Abs: 1 10*3/uL (ref 0.7–4.0)
MCH: 31.9 pg (ref 26.0–34.0)
MCHC: 31 g/dL (ref 30.0–36.0)
MCV: 102.9 fL — ABNORMAL HIGH (ref 80.0–100.0)
Monocytes Absolute: 0.4 10*3/uL (ref 0.1–1.0)
Monocytes Relative: 13 %
Neutro Abs: 1.4 10*3/uL — ABNORMAL LOW (ref 1.7–7.7)
Neutrophils Relative %: 49 %
Platelet Count: 256 10*3/uL (ref 150–400)
RBC: 2.73 MIL/uL — ABNORMAL LOW (ref 4.22–5.81)
RDW: 14.4 % (ref 11.5–15.5)
WBC Count: 3 10*3/uL — ABNORMAL LOW (ref 4.0–10.5)
nRBC: 0 % (ref 0.0–0.2)

## 2024-01-28 LAB — CMP (CANCER CENTER ONLY)
ALT: 2 U/L (ref 0–44)
AST: 12 U/L — ABNORMAL LOW (ref 15–41)
Albumin: 3.8 g/dL (ref 3.5–5.0)
Alkaline Phosphatase: 68 U/L (ref 38–126)
Anion gap: 5 (ref 5–15)
BUN: 16 mg/dL (ref 8–23)
CO2: 26 mmol/L (ref 22–32)
Calcium: 8.9 mg/dL (ref 8.9–10.3)
Chloride: 106 mmol/L (ref 98–111)
Creatinine: 0.74 mg/dL (ref 0.61–1.24)
GFR, Estimated: >60 mL/min (ref >60)
Glucose, Bld: 112 mg/dL — ABNORMAL HIGH (ref 70–99)
Potassium: 4.6 mmol/L (ref 3.5–5.1)
Sodium: 137 mmol/L (ref 135–145)
Total Bilirubin: 0.5 mg/dL (ref 0.0–1.2)
Total Protein: 6.6 g/dL (ref 6.5–8.1)

## 2024-01-28 LAB — SAMPLE TO BLOOD BANK

## 2024-01-29 LAB — PROSTATE-SPECIFIC AG, SERUM (LABCORP): Prostate Specific Ag, Serum: 31.9 ng/mL — ABNORMAL HIGH (ref 0.0–4.0)

## 2024-01-30 ENCOUNTER — Other Ambulatory Visit: Payer: Self-pay | Admitting: Physician Assistant

## 2024-01-30 DIAGNOSIS — C61 Malignant neoplasm of prostate: Secondary | ICD-10-CM

## 2024-01-31 ENCOUNTER — Inpatient Hospital Stay (HOSPITAL_BASED_OUTPATIENT_CLINIC_OR_DEPARTMENT_OTHER): Payer: Medicare Other | Admitting: Physician Assistant

## 2024-01-31 ENCOUNTER — Telehealth: Payer: Self-pay | Admitting: Neurology

## 2024-01-31 VITALS — BP 132/68 | HR 84 | Temp 97.6°F | Resp 15 | Wt 173.0 lb

## 2024-01-31 DIAGNOSIS — C7951 Secondary malignant neoplasm of bone: Secondary | ICD-10-CM

## 2024-01-31 DIAGNOSIS — C61 Malignant neoplasm of prostate: Secondary | ICD-10-CM | POA: Diagnosis not present

## 2024-01-31 NOTE — Telephone Encounter (Signed)
 Call to PT [facility], he reports patient having having more shakiness on right side today. Has taken vital, WNL, and medications taken. He is not sure if it is anxiety because of oncology appt this afternoon. I advised I would call and check on him.  Call to patient, no answer. Unable to leave message.

## 2024-01-31 NOTE — Progress Notes (Signed)
 Patient Care Team: Georgina Speaks, FNP as PCP - General (General Practice) Pickenpack-Cousar, Fannie SAILOR, NP as Nurse Practitioner (Nurse Practitioner) Linton, Macario HERO, LCSW as Social Worker (Licensed Clinical Social Worker) Patrcia Cough, MD as Consulting Physician (Radiation Oncology)   CHIEF COMPLAINT: Follow up metastatic prostate cancer   CURRENT THERAPY:  -01/02/2024: Cycle 1 of Pluvicto   INTERVAL HISTORY Mr. Hollibaugh returns for follow up, last seen by Mayme Silversmith NP on 12/04/23. In the interim, he received Cycle 1 of Pluvicto  on 01/02/2024. Unfortunately, he was hospitalized shortly after from 01/06/2024-01/10/2024 for failure to thrive, CAP versus radiation pneumonitis and dysphagia.   He presents accompanied by his spouse, [name]. Mr. Greenley reports he did poorly after receiving his first cycle of Pluvicto . He struggles with fatigue having some bad days. He continues to complete his basic ADLs. He reports his appetite is good without any weight changes. He denies nausea, vomiting or bowel habit changes. His pain is well controlled with Fentanyl . He takes tylenol  for breakthrough pain and occasional take MSIR if pain is severe. He does have some lightheadedness without any syncopal episodes. He is currently undergoing PT twice a week to gain more strength. He denies bruising or overt signs of bleeding. He reports developing significant bilateral lower extremity edema after Pluvicto  therapy which is slowly improving with compression. He denies fevers, chills, sweats, shortness of breath, chest pain or cough. He has no other complaints. Rest of the ROS is below.   ROS  All other systems reviewed and negative  Past Medical History:  Diagnosis Date   Arthritis    Depression    Family history of melanoma    Family history of prostate cancer    GERD (gastroesophageal reflux disease)    Hyperlipidemia    Hypertension    Parkinson disease (HCC)    Prostate cancer (HCC)      No past  surgical history on file.   Outpatient Encounter Medications as of 01/31/2024  Medication Sig   acetaminophen  (TYLENOL ) 500 MG tablet Take 1,000 mg by mouth every 6 (six) hours as needed for mild pain (pain score 1-3) or moderate pain (pain score 4-6).   amLODipine  (NORVASC ) 5 MG tablet Take 1 tablet (5 mg total) by mouth daily.   atorvastatin  (LIPITOR) 40 MG tablet TAKE 1 TABLET BY MOUTH EVERY MORNING   carbidopa -levodopa  (SINEMET ) 25-100 MG tablet Take 1 tablet by mouth 4 (four) times daily.   COENZYME Q10 PO Take 1 capsule by mouth every morning.   fentaNYL  (DURAGESIC ) 25 MCG/HR Place 1 patch onto the skin every 3 (three) days. Apply one patch (25mcg) in addition to 50mcg patch to equal 75mcg.   guaiFENesin  (MUCINEX ) 600 MG 12 hr tablet Take 2 tablets (1,200 mg total) by mouth 2 (two) times daily as needed for cough or to loosen phlegm.   ibuprofen  (ADVIL ) 200 MG tablet Take 400 mg by mouth every 6 (six) hours as needed for mild pain (pain score 1-3) or moderate pain (pain score 4-6).   KRILL OIL PO Take 1 capsule by mouth every morning.   magic mouthwash w/lidocaine  SOLN Take 10 mLs by mouth 4 (four) times daily as needed for mouth pain. Suspension contains equal amounts of Maalox Extra Strength, nystatin, diphenhydramine  and lidocaine .   morphine  (MSIR) 15 MG tablet Take 1 tablet (15 mg total) by mouth every 4 (four) hours as needed for moderate pain (pain score 4-6).   multivitamin-lutein (OCUVITE-LUTEIN) CAPS capsule Take 1 capsule by mouth every morning.  ondansetron  (ZOFRAN ) 4 MG tablet Take 1 tablet (4 mg total) by mouth every 6 (six) hours as needed for nausea.   No facility-administered encounter medications on file as of 01/31/2024.     Today's Vitals   01/31/24 1325 01/31/24 1329  BP: 132/68   Pulse: 84   Resp: 15   Temp: 97.6 F (36.4 C)   TempSrc: Temporal   SpO2: 100%   Weight: 173 lb (78.5 kg)   PainSc:  5    Body mass index is 26.3 kg/m.   ECOG PERFORMANCE STATUS:  2  PHYSICAL EXAM Constitutional: Oriented to person, place, and time and well-developed, well-nourished, and in no distress.  HENT:  Head: Normocephalic and atraumatic.  Eyes: Conjunctivae are normal. Right eye exhibits no discharge. Left eye exhibits no discharge. No scleral icterus.  Neck: Normal range of motion. Neck supple.   Cardiovascular: Normal rate, regular rhythm, normal heart sounds and intact distal pulses.   Pulmonary/Chest: Effort normal and breath sounds normal. No respiratory distress. No wheezes. No rales. s.  Musculoskeletal: Normal range of motion. Exhibits b/l lower extremity edema.  Neurological: Alert and oriented to person, place, and time. Exhibits normal muscle tone.  Skin: Skin is warm and dry. No rash noted. Not diaphoretic. No erythema. No pallor.  Psychiatric: Mood, memory and judgment normal.     CBC    Component Value Date/Time   WBC 3.0 (L) 01/28/2024 1534   WBC 6.8 01/10/2024 0626   RBC 2.73 (L) 01/28/2024 1534   HGB 8.7 (L) 01/28/2024 1534   HCT 28.1 (L) 01/28/2024 1534   PLT 256 01/28/2024 1534   MCV 102.9 (H) 01/28/2024 1534   MCH 31.9 01/28/2024 1534   MCHC 31.0 01/28/2024 1534   RDW 14.4 01/28/2024 1534   LYMPHSABS 1.0 01/28/2024 1534   MONOABS 0.4 01/28/2024 1534   EOSABS 0.2 01/28/2024 1534   BASOSABS 0.0 01/28/2024 1534     CMP     Component Value Date/Time   NA 137 01/28/2024 1534   NA 140 09/14/2021 0920   K 4.6 01/28/2024 1534   CL 106 01/28/2024 1534   CO2 26 01/28/2024 1534   GLUCOSE 112 (H) 01/28/2024 1534   BUN 16 01/28/2024 1534   BUN 18 09/14/2021 0920   CREATININE 0.74 01/28/2024 1534   CALCIUM  8.9 01/28/2024 1534   PROT 6.6 01/28/2024 1534   PROT 6.3 09/14/2021 0920   ALBUMIN 3.8 01/28/2024 1534   ALBUMIN 4.2 09/14/2021 0920   AST 12 (L) 01/28/2024 1534   ALT 2 01/28/2024 1534   ALKPHOS 68 01/28/2024 1534   BILITOT 0.5 01/28/2024 1534   GFRNONAA >60 01/28/2024 1534     ASSESSMENT & PLAN: 78 yo male   #  Metastatic castrate sensitive prostate cancer. -01/15/22: CT-guided biopsy of a pulmonary nodule confirmed the presence of prostate cancer -01/23/2022:  Firmagon  240 mg started  -Feb 2023: radiation therapy to the left sacroiliac pelvic bones completed. He received 30 Gray in 10 fractions  -03/06/2022: Abiraterone  1000 mg daily with prednisone  5 mg daily started  -01/03/2023: last visit with Dr. Amadeo. Restarted 500 mg abiraterone  daily.  -02/06/2023: transfer care to Dr. Federico. Increased dose to 1000 mg abiraterone  daily.  -04/11/2023: Due to increasing PSA transition to rucaparib 600 mg twice daily -07/03/2023: Held Rucaparib due to neutropenia and anemia.  -07/31/2023: Resume Rucaparib 300 mg PO BID.  -11/04/2023 PSMA with evidence of disease progression  -11/15/2023: PSA began rising 8.8 -11/18/2023: Increase Rucaparib to 300 mg  in AM and 600 mg in PM.  -11/25/2023: PSA increased to 11.2 -12/02/2023: PSA further increased 14.3 -01/02/2024: Cycle 1 of Pluvicto   PLAN: -Labs from 01/28/2024 were reviewed that require no intervention. WBC 3.0, Hgb 8.7, Plt 256, Creatinine and LFTs in range. PSA level improving from 39.2 to 31.9.  -Discussed Mr. Town overall performance status and tolerance of Pluvicto  therapy. Patient is not interested in continuing with Pluvicto  therapy or additional chemotherapy.  -He would like to continue with Lupron  q 4 months and Xgeva  q 56 days.  -Continue to have ongoing GOC discussion with palliative care team. Plan to follow up in 4 weeks -RTC in 8 weeks with repeat labs. He will receive his next Lupron  and Xgeva  injections at that time.   No orders of the defined types were placed in this encounter.   All questions were answered. The patient knows to call the clinic with any problems, questions or concerns. No barriers to learning were detected.   I have spent a total of 30 minutes minutes of face-to-face and non-face-to-face time, preparing to see the patient,  obtaining and/or reviewing separately obtained history, performing a medically appropriate examination, counseling and educating the patient,communicating with other health care professionals, documenting clinical information in the electronic health record, independently interpreting results and communicating results to the patient, and care coordination.   Johnston Police PA-C Dept of Hematology and Oncology [facility] Phone: [phone]  Patient was seen with Dr. Federico

## 2024-01-31 NOTE — Telephone Encounter (Signed)
 Frederick Ponce PT w/ [facility] reports pt having shakiness on right side more than left, also in chest torso area. Started about an hour ago, normally pt's meds address this.  Frederick Ponce stated vitals normal rang pt under some stress due to oncology appointment this afternoon

## 2024-02-03 ENCOUNTER — Telehealth: Payer: Self-pay | Admitting: Nurse Practitioner

## 2024-02-03 ENCOUNTER — Encounter: Payer: Self-pay | Admitting: Hematology and Oncology

## 2024-02-03 NOTE — Telephone Encounter (Signed)
 Scheduled appointment per 2/4 los. Left VM with appointment details.

## 2024-02-06 ENCOUNTER — Inpatient Hospital Stay: Payer: Medicare Other

## 2024-02-06 DIAGNOSIS — Z515 Encounter for palliative care: Secondary | ICD-10-CM

## 2024-02-06 DIAGNOSIS — C61 Malignant neoplasm of prostate: Secondary | ICD-10-CM

## 2024-02-06 LAB — CBC WITH DIFFERENTIAL (CANCER CENTER ONLY)
Abs Immature Granulocytes: 0.01 10*3/uL (ref 0.00–0.07)
Basophils Absolute: 0 10*3/uL (ref 0.0–0.1)
Basophils Relative: 1 %
Eosinophils Absolute: 0.2 10*3/uL (ref 0.0–0.5)
Eosinophils Relative: 5 %
HCT: 30.7 % — ABNORMAL LOW (ref 39.0–52.0)
Hemoglobin: 9.6 g/dL — ABNORMAL LOW (ref 13.0–17.0)
Immature Granulocytes: 0 %
Lymphocytes Relative: 34 %
Lymphs Abs: 1.4 10*3/uL (ref 0.7–4.0)
MCH: 32.8 pg (ref 26.0–34.0)
MCHC: 31.3 g/dL (ref 30.0–36.0)
MCV: 104.8 fL — ABNORMAL HIGH (ref 80.0–100.0)
Monocytes Absolute: 0.5 10*3/uL (ref 0.1–1.0)
Monocytes Relative: 11 %
Neutro Abs: 2.1 10*3/uL (ref 1.7–7.7)
Neutrophils Relative %: 49 %
Platelet Count: 209 10*3/uL (ref 150–400)
RBC: 2.93 MIL/uL — ABNORMAL LOW (ref 4.22–5.81)
RDW: 14.8 % (ref 11.5–15.5)
WBC Count: 4.2 10*3/uL (ref 4.0–10.5)
nRBC: 0 % (ref 0.0–0.2)

## 2024-02-06 LAB — CMP (CANCER CENTER ONLY)
ALT: 5 U/L (ref 0–44)
AST: 13 U/L — ABNORMAL LOW (ref 15–41)
Albumin: 4 g/dL (ref 3.5–5.0)
Alkaline Phosphatase: 69 U/L (ref 38–126)
Anion gap: 4 — ABNORMAL LOW (ref 5–15)
BUN: 14 mg/dL (ref 8–23)
CO2: 29 mmol/L (ref 22–32)
Calcium: 9.1 mg/dL (ref 8.9–10.3)
Chloride: 105 mmol/L (ref 98–111)
Creatinine: 0.72 mg/dL (ref 0.61–1.24)
GFR, Estimated: >60 mL/min (ref >60)
Glucose, Bld: 115 mg/dL — ABNORMAL HIGH (ref 70–99)
Potassium: 4.3 mmol/L (ref 3.5–5.1)
Sodium: 138 mmol/L (ref 135–145)
Total Bilirubin: 0.6 mg/dL (ref 0.0–1.2)
Total Protein: 6.7 g/dL (ref 6.5–8.1)

## 2024-02-07 LAB — PROSTATE-SPECIFIC AG, SERUM (LABCORP): Prostate Specific Ag, Serum: 19.5 ng/mL — ABNORMAL HIGH (ref 0.0–4.0)

## 2024-02-10 ENCOUNTER — Telehealth: Payer: Self-pay

## 2024-02-10 ENCOUNTER — Other Ambulatory Visit: Payer: Self-pay | Admitting: Nurse Practitioner

## 2024-02-10 ENCOUNTER — Encounter: Payer: Self-pay | Admitting: Nurse Practitioner

## 2024-02-10 ENCOUNTER — Ambulatory Visit (INDEPENDENT_AMBULATORY_CARE_PROVIDER_SITE_OTHER): Payer: Medicare Other | Admitting: Nurse Practitioner

## 2024-02-10 VITALS — BP 100/60 | HR 74 | Temp 98.5°F | Ht 68.0 in | Wt 174.0 lb

## 2024-02-10 DIAGNOSIS — C61 Malignant neoplasm of prostate: Secondary | ICD-10-CM

## 2024-02-10 DIAGNOSIS — E785 Hyperlipidemia, unspecified: Secondary | ICD-10-CM | POA: Diagnosis not present

## 2024-02-10 DIAGNOSIS — I700 Atherosclerosis of aorta: Secondary | ICD-10-CM | POA: Diagnosis not present

## 2024-02-10 DIAGNOSIS — Z2821 Immunization not carried out because of patient refusal: Secondary | ICD-10-CM | POA: Insufficient documentation

## 2024-02-10 DIAGNOSIS — C7802 Secondary malignant neoplasm of left lung: Secondary | ICD-10-CM | POA: Insufficient documentation

## 2024-02-10 DIAGNOSIS — I10 Essential (primary) hypertension: Secondary | ICD-10-CM

## 2024-02-10 DIAGNOSIS — G893 Neoplasm related pain (acute) (chronic): Secondary | ICD-10-CM

## 2024-02-10 DIAGNOSIS — C7801 Secondary malignant neoplasm of right lung: Secondary | ICD-10-CM | POA: Insufficient documentation

## 2024-02-10 DIAGNOSIS — R600 Localized edema: Secondary | ICD-10-CM | POA: Insufficient documentation

## 2024-02-10 DIAGNOSIS — C7951 Secondary malignant neoplasm of bone: Secondary | ICD-10-CM

## 2024-02-10 DIAGNOSIS — G20A1 Parkinson's disease without dyskinesia, without mention of fluctuations: Secondary | ICD-10-CM | POA: Diagnosis not present

## 2024-02-10 DIAGNOSIS — E663 Overweight: Secondary | ICD-10-CM

## 2024-02-10 DIAGNOSIS — Z6826 Body mass index (BMI) 26.0-26.9, adult: Secondary | ICD-10-CM

## 2024-02-10 DIAGNOSIS — Z515 Encounter for palliative care: Secondary | ICD-10-CM

## 2024-02-10 DIAGNOSIS — Z1159 Encounter for screening for other viral diseases: Secondary | ICD-10-CM | POA: Insufficient documentation

## 2024-02-10 MED ORDER — FENTANYL 75 MCG/HR TD PT72: 1.0000 | MEDICATED_PATCH | TRANSDERMAL | 0 refills | Status: DC

## 2024-02-10 NOTE — Progress Notes (Signed)
Madelaine Bhat, CMA,acting as a Neurosurgeon for Arnette Felts, FNP.,have documented all relevant documentation on the behalf of Arnette Felts, FNP,as directed by  Arnette Felts, FNP while in the presence of Arnette Felts, FNP.  Subjective:  Patient ID: Frederick Ponce , male    DOB: [birth year] , 78 y.o.   MRN: [id number]  No chief complaint on file.   HPI  Patient presents today for a bp and chol follow up, Patient reports compliance with medication. Patient denies any chest pain, SOB, or headaches. Patient also went to the hospital went to the ER on 01/06/2024-01/10/2024 for Failure to thrive in adult. He has also followed up with Oncology. Patient reports he is feeling better.  He is having swelling, he is using aquafor and mixing with cortisone to the legs every few days. He denies itching to his legs. He is wearing unna boots every other day.   He will continue the injections every 2 months and every 3 months. He feels decent since being off the medications. He has some days his energy levels are low. He has his pain controlled better but at night he has pain to his legs at night. He will take the morphine then. He was initially diagnosed with prostate cancer in 2022. He reports having a mutatation that the oral pills did not take effect as well.      Past Medical History:  Diagnosis Date   Arthritis    Depression    Family history of melanoma    Family history of prostate cancer    GERD (gastroesophageal reflux disease)    Hyperlipidemia    Hypertension    Parkinson disease (HCC)    Prostate cancer (HCC)      Family History  Problem Relation Age of Onset   Kidney disease Mother    Colon cancer Mother    Heart disease Father    Tremor Father    Parkinson's disease Father    Prostate cancer Father    Melanoma Sister    Parkinson's disease Sister    Melanoma Sister        melanoma x2   Prostate cancer Brother    Parkinson's disease Paternal Grandmother    Lung cancer Paternal  Grandfather    Parkinson's disease Paternal Aunt    Lung cancer Paternal Aunt      Current Outpatient Medications:    acetaminophen (TYLENOL) 500 MG tablet, Take 1,000 mg by mouth every 6 (six) hours as needed for mild pain (pain score 1-3) or moderate pain (pain score 4-6)., Disp: , Rfl:    amLODipine (NORVASC) 5 MG tablet, Take 1 tablet (5 mg total) by mouth daily., Disp: 90 tablet, Rfl: 1   atorvastatin (LIPITOR) 40 MG tablet, TAKE 1 TABLET BY MOUTH EVERY MORNING, Disp: 90 tablet, Rfl: 1   carbidopa-levodopa (SINEMET) 25-100 MG tablet, Take 1 tablet by mouth 4 (four) times daily., Disp: 360 tablet, Rfl: 3   COENZYME Q10 PO, Take 1 capsule by mouth every morning., Disp: , Rfl:    fentaNYL (DURAGESIC) 25 MCG/HR, Place 1 patch onto the skin every 3 (three) days. Apply one patch ( ) in addition to patch to equal ., Disp: 10 patch, Rfl: 0   guaiFENesin (MUCINEX) 600 MG 12 hr tablet, Take 2 tablets (1,200 mg total) by mouth 2 (two) times daily as needed for cough or to loosen phlegm., Disp: 60 tablet, Rfl: 0   ibuprofen (ADVIL) 200 MG tablet, Take 400 mg by mouth every 6 (  six) hours as needed for mild pain (pain score 1-3) or moderate pain (pain score 4-6)., Disp: , Rfl:    KRILL OIL PO, Take 1 capsule by mouth every morning., Disp: , Rfl:    magic mouthwash w/lidocaine SOLN, Take 10 mLs by mouth 4 (four) times daily as needed for mouth pain. Suspension contains equal amounts of Maalox Extra Strength, nystatin, diphenhydramine and lidocaine., Disp: 400 mL, Rfl: 0   morphine (MSIR) 15 MG tablet, Take 1 tablet (15 mg total) by mouth every 4 (four) hours as needed for moderate pain (pain score 4-6)., Disp: 30 tablet, Rfl: 0   multivitamin-lutein (OCUVITE-LUTEIN) CAPS capsule, Take 1 capsule by mouth every morning., Disp: , Rfl:    ondansetron (ZOFRAN) 4 MG tablet, Take 1 tablet (4 mg total) by mouth every 6 (six) hours as needed for nausea., Disp: 20 tablet, Rfl: 0   Allergies   Allergen Reactions   Gabapentin Other (See Comments)    Restless legs/confusion; patient denies symptoms and ask that this be removed.   Celebrex [Celecoxib] Rash     Review of Systems  Constitutional: Negative.   Respiratory: Negative.    Cardiovascular:  Positive for leg swelling (bilateal lower extremities).  Skin:  Positive for rash (bilateral erythema and rash to lower extremities).  Neurological: Negative.   Psychiatric/Behavioral: Negative.       Today's Vitals   02/10/24 1010  BP: 100/60  Pulse: 74  Temp: 98.5 F (36.9 C)  TempSrc: Oral  Weight: 174 lb (78.9 kg)  Height: 5\' 8"  (1.727 m)  PainSc: 5    Body mass index is 26.46 kg/m.  Wt Readings from Last 3 Encounters:  02/10/24 174 lb (78.9 kg)  01/31/24 173 lb (78.5 kg)  01/28/24 173 lb 9.6 oz (78.7 kg)     Objective:  Physical Exam Vitals reviewed.  Constitutional:      General: He is not in acute distress.    Appearance: Normal appearance.  Cardiovascular:     Pulses: Normal pulses.     Heart sounds: Normal heart sounds. No murmur heard. Pulmonary:     Effort: Pulmonary effort is normal. No respiratory distress.     Breath sounds: Normal breath sounds. No wheezing.  Musculoskeletal:     Right lower leg: Edema present.     Left lower leg: Edema present.  Skin:    General: Skin is warm and dry.     Capillary Refill: Capillary refill takes less than 2 seconds.     Findings: Erythema (bilateral lower extremities) and rash (bilateral lower extremities erythematous and slightly raised) present.  Neurological:     General: No focal deficit present.     Mental Status: He is alert and oriented to person, place, and time.     Cranial Nerves: No cranial nerve deficit.     Motor: No weakness.  Psychiatric:        Mood and Affect: Mood normal.        Behavior: Behavior normal.        Thought Content: Thought content normal.        Judgment: Judgment normal.     Assessment And Plan:  Hyperlipidemia,  unspecified hyperlipidemia type Assessment & Plan: Cholesterol levels are stable, continue statin tolerating well.  Orders: -     Lipid panel  Essential hypertension Assessment & Plan: Blood pressure is well-controlled.  Continue current medications.    Orders: -     BMP8+eGFR  Parkinson's disease, unspecified whether dyskinesia present, unspecified whether  manifestations fluctuate (HCC) Assessment & Plan: Stable, Continue follow-up with neurology   Prostate 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 [facility]) Assessment & Plan: Continue follow-up with oncology. He has had a recent hospitalization he reports as being related to a reaction to his IV chemo. He has decided not to continue with this but will get the Lupron injections. He also has palliative care visits every 4 weeks.    Secondary malignant neoplasm of left lung [facility]) Assessment & Plan: Continue f/u with Oncology   Secondary malignant neoplasm of right lung [facility]) Assessment & Plan: Continue f/u with Oncology   Atherosclerosis of aorta [facility]) Assessment & Plan: Continue statin, tolerating well.   Encounter for hepatitis C screening test for low risk patient Assessment & Plan: Will check Hepatitis C screening due to recent recommendations to screen all adults 18 years and older   Orders: -     Hepatitis C antibody  COVID-19 vaccination declined Assessment & Plan: Declines covid 19 vaccine. Discussed risk of covid 42 and if he changes her mind about the vaccine to call the office. Education has been provided regarding the importance of this vaccine but patient still declined. Advised may receive this vaccine at local pharmacy or Health Dept.or vaccine clinic. Aware to provide a copy of the vaccination record if obtained from local pharmacy or Health Dept.  Encouraged to take multivitamin, vitamin d, vitamin c and zinc to increase immune system. Aware can call office if would like to have vaccine here at office. Verbalized  acceptance and understanding.    Overweight with body mass index (BMI) of 26 to 26.9 in adult  Bilateral lower extremity edema Assessment & Plan: He has edema to his lower extremities with erythema and slightly raised rash. He is to continue with the Unna boots, I recommend to wear daily and keep lower extremities elevated when possible. If worsens return call to office will send a different steroid cream.     Return for 6 month bp check.  Patient was given opportunity to ask questions. Patient verbalized understanding of the plan and was able to repeat key elements of the plan. All questions were answered to their satisfaction.    Jeanell Sparrow, FNP, have reviewed all documentation for this visit. The documentation on 02/10/24 for the exam, diagnosis, procedures, and orders are all accurate and complete.   IF YOU HAVE BEEN REFERRED TO A SPECIALIST, IT MAY TAKE 1-2 WEEKS TO SCHEDULE/PROCESS THE REFERRAL. IF YOU HAVE NOT HEARD FROM US/SPECIALIST IN TWO WEEKS, PLEASE GIVE [place] A CALL AT [phone].

## 2024-02-10 NOTE — Telephone Encounter (Signed)
 Pt advised with [facility]U

## 2024-02-10 NOTE — Assessment & Plan Note (Signed)
 Will check Hepatitis C screening due to recent recommendations to screen all adults 18 years and older

## 2024-02-10 NOTE — Assessment & Plan Note (Signed)
 Continue f/u with Oncology

## 2024-02-10 NOTE — Assessment & Plan Note (Signed)
 Continue statin, tolerating well

## 2024-02-10 NOTE — Assessment & Plan Note (Signed)
Cholesterol levels are stable, continue statin tolerating well.

## 2024-02-10 NOTE — Assessment & Plan Note (Addendum)
Stable, Continue follow-up with neurology

## 2024-02-10 NOTE — Assessment & Plan Note (Signed)
He has edema to his lower extremities with erythema and slightly raised rash. He is to continue with the Unna boots, I recommend to wear daily and keep lower extremities elevated when possible. If worsens return call to office will send a different steroid cream.

## 2024-02-10 NOTE — Assessment & Plan Note (Signed)
Continue follow-up with oncology. He has had a recent hospitalization he reports as being related to a reaction to his IV chemo. He has decided not to continue with this but will get the Lupron injections. He also has palliative care visits every 4 weeks.

## 2024-02-10 NOTE — Assessment & Plan Note (Signed)

## 2024-02-10 NOTE — Assessment & Plan Note (Signed)
 Blood pressure is well controlled. Continue current medications.

## 2024-02-10 NOTE — Telephone Encounter (Signed)
-----   Message from Briant Cedar sent at 02/10/2024  1:31 PM EST ----- Please notify patient that PSA level continues to improve. ----- Message ----- From: Interface, [facility] [place] Sent: 02/06/2024   3:21 PM EST To: Briant Cedar, PA-C

## 2024-02-11 LAB — BMP8+EGFR
BUN/Creatinine Ratio: 21 (ref 10–24)
BUN: 16 mg/dL (ref 8–27)
CO2: 22 mmol/L (ref 20–29)
Calcium: 9 mg/dL (ref 8.6–10.2)
Chloride: 103 mmol/L (ref 96–106)
Creatinine, Ser: 0.76 mg/dL (ref 0.76–1.27)
Glucose: 105 mg/dL — ABNORMAL HIGH (ref 70–99)
Potassium: 5.2 mmol/L (ref 3.5–5.2)
Sodium: 140 mmol/L (ref 134–144)
eGFR: 92 mL/min/{1.73_m2} (ref >59)

## 2024-02-11 LAB — LIPID PANEL
Chol/HDL Ratio: 2.4 {ratio} (ref 0.0–5.0)
Cholesterol, Total: 171 mg/dL (ref 100–199)
HDL: 71 mg/dL (ref >39)
LDL Chol Calc (NIH): 86 mg/dL (ref 0–99)
Triglycerides: 75 mg/dL (ref 0–149)
VLDL Cholesterol Cal: 14 mg/dL (ref 5–40)

## 2024-02-11 LAB — HEPATITIS C ANTIBODY: Hep C Virus Ab: NONREACTIVE

## 2024-02-13 ENCOUNTER — Other Ambulatory Visit (HOSPITAL_COMMUNITY): Payer: Medicare Other

## 2024-03-04 ENCOUNTER — Other Ambulatory Visit: Payer: Self-pay | Admitting: Nurse Practitioner

## 2024-03-04 DIAGNOSIS — G893 Neoplasm related pain (acute) (chronic): Secondary | ICD-10-CM

## 2024-03-04 DIAGNOSIS — C7802 Secondary malignant neoplasm of left lung: Secondary | ICD-10-CM

## 2024-03-04 DIAGNOSIS — Z515 Encounter for palliative care: Secondary | ICD-10-CM

## 2024-03-04 DIAGNOSIS — C7951 Secondary malignant neoplasm of bone: Secondary | ICD-10-CM

## 2024-03-04 MED ORDER — FENTANYL 75 MCG/HR TD PT72
1.0000 | MEDICATED_PATCH | TRANSDERMAL | 0 refills | Status: DC
Start: 2024-03-04 — End: 2024-04-09

## 2024-03-19 ENCOUNTER — Inpatient Hospital Stay: Payer: Medicare Other | Attending: Nurse Practitioner

## 2024-03-19 ENCOUNTER — Inpatient Hospital Stay (HOSPITAL_BASED_OUTPATIENT_CLINIC_OR_DEPARTMENT_OTHER): Payer: Medicare Other | Admitting: Nurse Practitioner

## 2024-03-19 ENCOUNTER — Encounter: Payer: Self-pay | Admitting: Nurse Practitioner

## 2024-03-19 VITALS — BP 132/69 | HR 79 | Temp 97.9°F | Resp 16 | Wt 170.7 lb

## 2024-03-19 DIAGNOSIS — C61 Malignant neoplasm of prostate: Secondary | ICD-10-CM | POA: Insufficient documentation

## 2024-03-19 DIAGNOSIS — C7951 Secondary malignant neoplasm of bone: Secondary | ICD-10-CM

## 2024-03-19 DIAGNOSIS — G893 Neoplasm related pain (acute) (chronic): Secondary | ICD-10-CM | POA: Diagnosis not present

## 2024-03-19 DIAGNOSIS — Z79899 Other long term (current) drug therapy: Secondary | ICD-10-CM | POA: Diagnosis not present

## 2024-03-19 DIAGNOSIS — G8929 Other chronic pain: Secondary | ICD-10-CM | POA: Insufficient documentation

## 2024-03-19 DIAGNOSIS — Z515 Encounter for palliative care: Secondary | ICD-10-CM | POA: Diagnosis not present

## 2024-03-19 DIAGNOSIS — K5900 Constipation, unspecified: Secondary | ICD-10-CM | POA: Insufficient documentation

## 2024-03-19 LAB — CMP (CANCER CENTER ONLY)
ALT: 5 U/L (ref 0–44)
AST: 14 U/L — ABNORMAL LOW (ref 15–41)
Albumin: 4.5 g/dL (ref 3.5–5.0)
Alkaline Phosphatase: 61 U/L (ref 38–126)
Anion gap: 3 — ABNORMAL LOW (ref 5–15)
BUN: 19 mg/dL (ref 8–23)
CO2: 29 mmol/L (ref 22–32)
Calcium: 9.6 mg/dL (ref 8.9–10.3)
Chloride: 105 mmol/L (ref 98–111)
Creatinine: 0.82 mg/dL (ref 0.61–1.24)
GFR, Estimated: >60 mL/min (ref >60)
Glucose, Bld: 116 mg/dL — ABNORMAL HIGH (ref 70–99)
Potassium: 4.2 mmol/L (ref 3.5–5.1)
Sodium: 137 mmol/L (ref 135–145)
Total Bilirubin: 0.8 mg/dL (ref 0.0–1.2)
Total Protein: 7.2 g/dL (ref 6.5–8.1)

## 2024-03-19 LAB — CBC WITH DIFFERENTIAL (CANCER CENTER ONLY)
Abs Immature Granulocytes: 0 10*3/uL (ref 0.00–0.07)
Basophils Absolute: 0 10*3/uL (ref 0.0–0.1)
Basophils Relative: 0 %
Eosinophils Absolute: 0.1 10*3/uL (ref 0.0–0.5)
Eosinophils Relative: 3 %
HCT: 34 % — ABNORMAL LOW (ref 39.0–52.0)
Hemoglobin: 10.7 g/dL — ABNORMAL LOW (ref 13.0–17.0)
Immature Granulocytes: 0 %
Lymphocytes Relative: 40 %
Lymphs Abs: 1.5 10*3/uL (ref 0.7–4.0)
MCH: 32.1 pg (ref 26.0–34.0)
MCHC: 31.5 g/dL (ref 30.0–36.0)
MCV: 102.1 fL — ABNORMAL HIGH (ref 80.0–100.0)
Monocytes Absolute: 0.4 10*3/uL (ref 0.1–1.0)
Monocytes Relative: 10 %
Neutro Abs: 1.8 10*3/uL (ref 1.7–7.7)
Neutrophils Relative %: 47 %
Platelet Count: 209 10*3/uL (ref 150–400)
RBC: 3.33 MIL/uL — ABNORMAL LOW (ref 4.22–5.81)
RDW: 14.8 % (ref 11.5–15.5)
WBC Count: 3.9 10*3/uL — ABNORMAL LOW (ref 4.0–10.5)
nRBC: 0 % (ref 0.0–0.2)

## 2024-03-19 NOTE — Progress Notes (Signed)
 Palliative Medicine [facility]  [phone] [phone]   Name: Frederick Ponce Date: 03/19/2024 MRN: [id number]  DOB: May 03, 1946  Patient Care Team: Frederick Felts, FNP as PCP - General (General Practice) Pickenpack-Cousar, Arty Baumgartner, NP as Nurse Practitioner (Nurse Practitioner) Frederick Ponce, Frederick Lux, LCSW as Social Worker (Licensed Clinical Social Worker) Frederick Dys, MD as Consulting Physician (Radiation Oncology)   REASON FOR CONSULTATION: Frederick Ponce is a 78 y.o. male with medical history including Parkinson's disease and hyperlipidemia. 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 He is having urgent radiation appointment due to iliac bone pain. Palliative ask to see for symptom management.   SOCIAL HISTORY:     reports that he has never smoked. He has never used smokeless tobacco. He reports current alcohol use of about 15.0 standard drinks of alcohol per week. He reports that he does not use drugs.  ADVANCE DIRECTIVES:  Patient is scheduled to complete advanced directives at our clinic here at the cancer center on 02/16/2022. MOST form provided.  Patient plans to review and complete at a later time.  CODE STATUS:   PAST MEDICAL HISTORY: Past Medical History:  Diagnosis Date   Arthritis    Depression    Family history of melanoma    Family history of prostate cancer    GERD (gastroesophageal reflux disease)    Hyperlipidemia    Hypertension    Parkinson disease (HCC)    Prostate cancer (HCC)     PAST SURGICAL HISTORY: History reviewed. No pertinent surgical history.  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  The report date is December 21, 2022.  The CancerNext-Expanded gene panel offered by [facility] and includes sequencing and rearrangement analysis for the following 77 genes: AIP, ALK, APC*, ATM*, AXIN2, BAP1, BARD1, BLM, BMPR1A, BRCA1*, BRCA2*, BRIP1*, CDC73, CDH1*, CDK4, CDKN1B, CDKN2A, CHEK2*, CTNNA1, DICER1, FANCC, FH, FLCN, GALNT12, KIF1B, LZTR1, MAX, MEN1, MET, MLH1*, MSH2*, MSH3, MSH6*, MUTYH*, NBN, NF1*, NF2, NTHL1, PALB2*, PHOX2B, PMS2*, POT1, PRKAR1A, PTCH1, PTEN*, RAD51C*, RAD51D*, RB1, RECQL, RET, SDHA, SDHAF2, SDHB, SDHC, SDHD, SMAD4, SMARCA4, SMARCB1, SMARCE1, STK11, SUFU, TMEM127, TP53*, TSC1, TSC2, VHL and XRCC2 (sequencing and deletion/duplication); EGFR, EGLN1, HOXB13, KIT, MITF, PDGFRA, POLD1, and POLE (sequencing only); EPCAM and GREM1 (deletion/duplication only). DNA and RNA analyses performed for * genes.      ALLERGIES:  is allergic to gabapentin and celebrex [celecoxib].  MEDICATIONS:  Current Outpatient Medications  Medication Sig Dispense Refill   acetaminophen (TYLENOL) 500 MG tablet Take 1,000 mg by mouth every 6 (six) hours as needed for mild pain (pain score 1-3) or moderate pain (pain score 4-6).     amLODipine (NORVASC) 5 MG tablet Take 1 tablet (5 mg total) by mouth daily. 90 tablet 1   atorvastatin (LIPITOR) 40 MG tablet TAKE 1 TABLET BY MOUTH EVERY MORNING 90 tablet 1   carbidopa-levodopa (SINEMET) 25-100 MG tablet Take 1 tablet by mouth 4 (  four) times daily. 360 tablet 3   COENZYME Q10 PO Take 1 capsule by mouth every morning.     fentaNYL (DURAGESIC) 75 MCG/HR Place 1 patch onto the skin every other day. 15 patch 0   guaiFENesin (MUCINEX) 600 MG 12 hr tablet Take 2 tablets (1,200 mg  total) by mouth 2 (two) times daily as needed for cough or to loosen phlegm. 60 tablet 0   ibuprofen (ADVIL) 200 MG tablet Take 400 mg by mouth every 6 (six) hours as needed for mild pain (pain score 1-3) or moderate pain (pain score 4-6).     KRILL OIL PO Take 1 capsule by mouth every morning.     magic mouthwash w/lidocaine SOLN Take 10 mLs by mouth 4 (four) times daily as needed for mouth pain. Suspension contains equal amounts of Maalox Extra Strength, nystatin, diphenhydramine and lidocaine. 400 mL 0   morphine (MSIR) 15 MG tablet Take 1 tablet (15 mg total) by mouth every 4 (four) hours as needed for moderate pain (pain score 4-6). 30 tablet 0   multivitamin-lutein (OCUVITE-LUTEIN) CAPS capsule Take 1 capsule by mouth every morning.     ondansetron (ZOFRAN) 4 MG tablet Take 1 tablet (4 mg total) by mouth every 6 (six) hours as needed for nausea. 20 tablet 0   No current facility-administered medications for this visit.    PERFORMANCE STATUS (ECOG) : 1 - Symptomatic but completely ambulatory  Assessment NAD Normal breathing pattern RRR AAO x3  Discussed the use of AI scribe software for clinical note transcription with the patient, who gave verbal consent to proceed.   Discussed the use of AI scribe software for clinical note transcription with the patient, who gave verbal consent to proceed. History of Present Illness Frederick Ponce "Frederick Ponce" is a 78 year old male with chronic pain and Parkinson's disease who presents with persistent pain management issues.He occasionally experiences dizziness and has noticed small red spots on his skin that come and go without causing pain or itching. No headaches.  He experiences severe hot flashes, describing them as 'sweating like a stuck pig,' which significantly impacts his sleep as he wakes up drenched and has to change clothes. He notes that these episodes have worsened over time.  Frederick Ponce continues to experience persistent pain that is not  alleviated by his current medication regimen. He has been using a 75 microgram fentanyl patch since July, but continues to have significant pain, particularly at night, with shooting pains in his legs between 2 and 5 AM. He often has to get up and walk to alleviate the discomfort. His pain management involves a daily decision of 'what am I going to take tonight?' He uses morphine occasionally but is cautious due to its constipating effects. He takes ibuprofen or Tylenol in the morning, usually two 200 mg tablets of ibuprofen. He also uses a stool softener daily to manage constipation.   We discussed his current pain regimen also with limitations in managing what seems to be more of a neuropathic type pain.  He has tried gabapentin in the past without success. He continues to use topical treatments, which provide some relief.  Education provided on medications use to manage neuropathic pain versus changes to his fentanyl patch.  At this time we will continue with current regimen.  No adjustments.  Will continue to consider her options him additional relief in the future.  Patient and husband verbalized understanding.  We will continue to closely monitor and support. 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 Current regimen includes fentanyl, morphine ir, ibuprofen, and acetaminophen. Increasing fentanyl not advised for nerve pain. Morphine limited by constipation. Gabapentin not tolerated. Valium considered for nerve and muscle pain. - Continue 75 mcg fentanyl patch. - Use morphine sparingly with constipation management. - Continue ibuprofen and acetaminophen as needed. - Consider Valium for nerve and muscle pain. - Discuss with other providers for additional pain management options.  Follow-up - Confirm lab appointment on April 4th and ensure necessity of labs. - Follow up with Karena Addison regarding lab appointments and necessity. -I will plan to follow-up with  patient in 6-8 weeks.   Patient expressed understanding and was in agreement with this plan. He also understands that He can call the clinic at any time with any questions, concerns, or complaints.    Any controlled substances utilized were prescribed in the context of palliative care. PDMP has been reviewed.    Visit consisted of counseling and education dealing with the complex and emotionally intense issues of symptom management and palliative care in the setting of serious and potentially life-threatening illness.  Willette Alma, AGPCNP-BC  Palliative Medicine Team/[facility]

## 2024-03-20 ENCOUNTER — Telehealth: Payer: Self-pay | Admitting: Hematology and Oncology

## 2024-03-20 ENCOUNTER — Telehealth: Payer: Self-pay | Admitting: *Deleted

## 2024-03-20 LAB — PROSTATE-SPECIFIC AG, SERUM (LABCORP): Prostate Specific Ag, Serum: 4.7 ng/mL — ABNORMAL HIGH (ref 0.0–4.0)

## 2024-03-20 NOTE — Telephone Encounter (Signed)
 Received vm message from pt requesting a call back regarding his PSA results from 03/19/24. No answer but was able to leave detailed vm message on his identified phone vm. Advised that his PSA is indeed 4.7. Per Dr. Leonides Schanz, he believes it to be accurate and that there can be delayed response to treatment. Advised that if he wants to have it checked again next week when he comes in to see Dr. Leonides Schanz on 03/27/24. Advised to call back with any further questions/concerns to [phone]

## 2024-03-20 NOTE — Telephone Encounter (Signed)
 Patient left a voicemail in regards to a lab appointment on a Monday? I attempted to call the patient back and left a voicemail informing him that he was only scheduled for three appointments on Friday 04/04.

## 2024-03-25 ENCOUNTER — Other Ambulatory Visit: Payer: Self-pay | Admitting: Physician Assistant

## 2024-03-25 DIAGNOSIS — C7951 Secondary malignant neoplasm of bone: Secondary | ICD-10-CM

## 2024-03-26 ENCOUNTER — Telehealth: Payer: Self-pay | Admitting: Nurse Practitioner

## 2024-03-26 ENCOUNTER — Other Ambulatory Visit (HOSPITAL_COMMUNITY): Payer: Medicare Other

## 2024-03-26 NOTE — Telephone Encounter (Signed)
 Left a voicemail with the appointment details. The patient is active on MyChart and will be mailed an appointment reminder.

## 2024-03-27 ENCOUNTER — Inpatient Hospital Stay: Attending: Nurse Practitioner

## 2024-03-27 ENCOUNTER — Inpatient Hospital Stay (HOSPITAL_BASED_OUTPATIENT_CLINIC_OR_DEPARTMENT_OTHER): Admitting: Physician Assistant

## 2024-03-27 ENCOUNTER — Ambulatory Visit: Payer: Medicare Other

## 2024-03-27 ENCOUNTER — Other Ambulatory Visit: Payer: Medicare Other

## 2024-03-27 ENCOUNTER — Encounter: Payer: Self-pay | Admitting: General Practice

## 2024-03-27 ENCOUNTER — Ambulatory Visit: Payer: Medicare Other | Admitting: Hematology and Oncology

## 2024-03-27 ENCOUNTER — Inpatient Hospital Stay

## 2024-03-27 VITALS — BP 134/67 | HR 69 | Temp 97.9°F | Resp 13 | Wt 169.2 lb

## 2024-03-27 DIAGNOSIS — C61 Malignant neoplasm of prostate: Secondary | ICD-10-CM

## 2024-03-27 DIAGNOSIS — C7951 Secondary malignant neoplasm of bone: Secondary | ICD-10-CM | POA: Insufficient documentation

## 2024-03-27 DIAGNOSIS — Z79899 Other long term (current) drug therapy: Secondary | ICD-10-CM | POA: Insufficient documentation

## 2024-03-27 DIAGNOSIS — Z5111 Encounter for antineoplastic chemotherapy: Secondary | ICD-10-CM | POA: Diagnosis present

## 2024-03-27 LAB — CBC WITH DIFFERENTIAL (CANCER CENTER ONLY)
Abs Immature Granulocytes: 0 10*3/uL (ref 0.00–0.07)
Basophils Absolute: 0 10*3/uL (ref 0.0–0.1)
Basophils Relative: 0 %
Eosinophils Absolute: 0.1 10*3/uL (ref 0.0–0.5)
Eosinophils Relative: 3 %
HCT: 33.8 % — ABNORMAL LOW (ref 39.0–52.0)
Hemoglobin: 10.7 g/dL — ABNORMAL LOW (ref 13.0–17.0)
Immature Granulocytes: 0 %
Lymphocytes Relative: 39 %
Lymphs Abs: 1.6 10*3/uL (ref 0.7–4.0)
MCH: 32.1 pg (ref 26.0–34.0)
MCHC: 31.7 g/dL (ref 30.0–36.0)
MCV: 101.5 fL — ABNORMAL HIGH (ref 80.0–100.0)
Monocytes Absolute: 0.4 10*3/uL (ref 0.1–1.0)
Monocytes Relative: 9 %
Neutro Abs: 2.1 10*3/uL (ref 1.7–7.7)
Neutrophils Relative %: 49 %
Platelet Count: 200 10*3/uL (ref 150–400)
RBC: 3.33 MIL/uL — ABNORMAL LOW (ref 4.22–5.81)
RDW: 14.8 % (ref 11.5–15.5)
WBC Count: 4.2 10*3/uL (ref 4.0–10.5)
nRBC: 0 % (ref 0.0–0.2)

## 2024-03-27 LAB — CMP (CANCER CENTER ONLY)
ALT: <5 U/L (ref 0–44)
AST: 14 U/L — ABNORMAL LOW (ref 15–41)
Albumin: 4.4 g/dL (ref 3.5–5.0)
Alkaline Phosphatase: 62 U/L (ref 38–126)
Anion gap: 5 (ref 5–15)
BUN: 17 mg/dL (ref 8–23)
CO2: 29 mmol/L (ref 22–32)
Calcium: 9.5 mg/dL (ref 8.9–10.3)
Chloride: 104 mmol/L (ref 98–111)
Creatinine: 0.81 mg/dL (ref 0.61–1.24)
GFR, Estimated: >60 mL/min (ref >60)
Glucose, Bld: 113 mg/dL — ABNORMAL HIGH (ref 70–99)
Potassium: 4.9 mmol/L (ref 3.5–5.1)
Sodium: 138 mmol/L (ref 135–145)
Total Bilirubin: 0.7 mg/dL (ref 0.0–1.2)
Total Protein: 7.1 g/dL (ref 6.5–8.1)

## 2024-03-27 NOTE — Progress Notes (Signed)
 [facility] Spiritual Care Note  Visited briefly with Frederick Ponce and husband [name] in lobby, hearing updates on treatment changes and results. They plan to reach out to chaplain as needed/desired for follow-up support.  [address] [place], [place], [facility] Pager [phone] Voicemail [phone]

## 2024-03-28 LAB — PROSTATE-SPECIFIC AG, SERUM (LABCORP): Prostate Specific Ag, Serum: 5.7 ng/mL — ABNORMAL HIGH (ref 0.0–4.0)

## 2024-03-30 ENCOUNTER — Encounter: Payer: Self-pay | Admitting: Physician Assistant

## 2024-03-30 ENCOUNTER — Inpatient Hospital Stay

## 2024-03-30 ENCOUNTER — Encounter: Payer: Self-pay | Admitting: Hematology and Oncology

## 2024-03-30 VITALS — BP 131/69 | HR 67 | Temp 99.4°F | Resp 15

## 2024-03-30 DIAGNOSIS — Z5111 Encounter for antineoplastic chemotherapy: Secondary | ICD-10-CM | POA: Diagnosis not present

## 2024-03-30 DIAGNOSIS — C61 Malignant neoplasm of prostate: Secondary | ICD-10-CM

## 2024-03-30 MED ORDER — LEUPROLIDE ACETATE (4 MONTH) 30 MG ~~LOC~~ KIT
30.0000 mg | PACK | SUBCUTANEOUS | Status: DC
  Administered 2024-03-30: 30 mg via SUBCUTANEOUS
  Filled 2024-03-30: qty 30

## 2024-03-30 MED ORDER — DENOSUMAB 120 MG/1.7ML ~~LOC~~ SOLN
120.0000 mg | Freq: Once | SUBCUTANEOUS | Status: AC
  Administered 2024-03-30: 120 mg via SUBCUTANEOUS
  Filled 2024-03-30: qty 1.7

## 2024-03-30 NOTE — Progress Notes (Signed)
 Patient Care Team: Arnette Felts, FNP as PCP - General (General Practice) Pickenpack-Cousar, Arty Baumgartner, NP as Nurse Practitioner (Nurse Practitioner) Alisa Graff, Pascal Lux, LCSW as Social Worker (Licensed Clinical Social Worker) Margaretmary Dys, MD as Consulting Physician (Radiation Oncology)   CHIEF COMPLAINT: Follow up metastatic prostate cancer   CURRENT THERAPY:  -01/02/2024: Cycle 1 of Pluvicto  INTERVAL HISTORY Frederick Ponce returns for follow up, last seen on 01/31/2024. In the interim, he continues on treatment holiday and continues on Xgeva plus Lupron therapy. He is accompanied by his spouse, Frederick Hua, for this visit.   [name] reports that he continues to struggle with fatigue which is worse some days than others.  He continues to complete his ADLs on his own but he does have certain days where he is in bed most of the time.  He continues to struggle with diffuse pain and requires fentanyl patch along with MS IR plus or minus Tylenol for breakthrough pain.  He is struggling with severe hot flashes and night sweats.  He reports his appetite is good.  He denies nausea, vomiting or bowel habit changes. He denies fevers, chills, sweats, shortness of breath, chest pain or cough. He has no other complaints. Rest of the ROS is below.   ROS  All other systems reviewed and negative  Past Medical History:  Diagnosis Date   Arthritis    Depression    Family history of melanoma    Family history of prostate cancer    GERD (gastroesophageal reflux disease)    Hyperlipidemia    Hypertension    Parkinson disease (HCC)    Prostate cancer (HCC)      No past surgical history on file.   Outpatient Encounter Medications as of 03/27/2024  Medication Sig   acetaminophen (TYLENOL) 500 MG tablet Take 1,000 mg by mouth every 6 (six) hours as needed for mild pain (pain score 1-3) or moderate pain (pain score 4-6).   amLODipine (NORVASC) 5 MG tablet Take 1 tablet (5 mg total) by mouth daily.   atorvastatin  (LIPITOR) 40 MG tablet TAKE 1 TABLET BY MOUTH EVERY MORNING   carbidopa-levodopa (SINEMET) 25-100 MG tablet Take 1 tablet by mouth 4 (four) times daily.   COENZYME Q10 PO Take 1 capsule by mouth every morning.   fentaNYL (DURAGESIC) 75 MCG/HR Place 1 patch onto the skin every other day.   guaiFENesin (MUCINEX) 600 MG 12 hr tablet Take 2 tablets (1,200 mg total) by mouth 2 (two) times daily as needed for cough or to loosen phlegm.   ibuprofen (ADVIL) 200 MG tablet Take 400 mg by mouth every 6 (six) hours as needed for mild pain (pain score 1-3) or moderate pain (pain score 4-6).   KRILL OIL PO Take 1 capsule by mouth every morning.   magic mouthwash w/lidocaine SOLN Take 10 mLs by mouth 4 (four) times daily as needed for mouth pain. Suspension contains equal amounts of Maalox Extra Strength, nystatin, diphenhydramine and lidocaine.   morphine (MSIR) 15 MG tablet Take 1 tablet (15 mg total) by mouth every 4 (four) hours as needed for moderate pain (pain score 4-6).   multivitamin-lutein (OCUVITE-LUTEIN) CAPS capsule Take 1 capsule by mouth every morning.   ondansetron (ZOFRAN) 4 MG tablet Take 1 tablet (4 mg total) by mouth every 6 (six) hours as needed for nausea.   No facility-administered encounter medications on file as of 03/27/2024.     Today's Vitals   03/27/24 1447 03/27/24 1448  BP: 134/67  Pulse: 69   Resp: 13   Temp: 97.9 F (36.6 C)   TempSrc: Temporal   SpO2: 97%   Weight: 169 lb 3.2 oz (76.7 kg)   PainSc:  4    Body mass index is 25.73 kg/m.   ECOG PERFORMANCE STATUS: 2  PHYSICAL EXAM Constitutional: Oriented to person, place, and time and well-developed, well-nourished, and in no distress.  HENT:  Head: Normocephalic and atraumatic.  Eyes: Conjunctivae are normal. Right eye exhibits no discharge. Left eye exhibits no discharge. No scleral icterus.  Neck: Normal range of motion. Neck supple.   Cardiovascular: Normal rate, regular rhythm, normal heart sounds and intact  distal pulses.   Pulmonary/Chest: Effort normal and breath sounds normal. No respiratory distress. No wheezes. No rales. s.  Musculoskeletal: Normal range of motion. Exhibits b/l lower extremity edema.  Neurological: Alert and oriented to person, place, and time. Exhibits normal muscle tone.  Skin: Skin is warm and dry. No rash noted. Not diaphoretic. No erythema. No pallor.  Psychiatric: Mood, memory and judgment normal.     CBC    Component Value Date/Time   WBC 4.2 03/27/2024 1424   WBC 6.8 01/10/2024 0626   RBC 3.33 (L) 03/27/2024 1424   HGB 10.7 (L) 03/27/2024 1424   HCT 33.8 (L) 03/27/2024 1424   PLT 200 03/27/2024 1424   MCV 101.5 (H) 03/27/2024 1424   MCH 32.1 03/27/2024 1424   MCHC 31.7 03/27/2024 1424   RDW 14.8 03/27/2024 1424   LYMPHSABS 1.6 03/27/2024 1424   MONOABS 0.4 03/27/2024 1424   EOSABS 0.1 03/27/2024 1424   BASOSABS 0.0 03/27/2024 1424     CMP     Component Value Date/Time   NA 138 03/27/2024 1424   NA 140 02/10/2024 1042   K 4.9 03/27/2024 1424   CL 104 03/27/2024 1424   CO2 29 03/27/2024 1424   GLUCOSE 113 (H) 03/27/2024 1424   BUN 17 03/27/2024 1424   BUN 16 02/10/2024 1042   CREATININE 0.81 03/27/2024 1424   CALCIUM 9.5 03/27/2024 1424   PROT 7.1 03/27/2024 1424   PROT 6.3 09/14/2021 0920   ALBUMIN 4.4 03/27/2024 1424   ALBUMIN 4.2 09/14/2021 0920   AST 14 (L) 03/27/2024 1424   ALT <5 03/27/2024 1424   ALKPHOS 62 03/27/2024 1424   BILITOT 0.7 03/27/2024 1424   GFRNONAA >60 03/27/2024 1424     ASSESSMENT & PLAN: 78 yo male   # Metastatic castrate sensitive prostate cancer. -01/15/22: CT-guided biopsy of a pulmonary nodule confirmed the presence of prostate cancer -01/23/2022:  Deborra Medina 240 mg started  -Feb 2023: radiation therapy to the left sacroiliac pelvic bones completed. He received 30 Gray in 10 fractions  -03/06/2022: Abiraterone 1000 mg daily with prednisone 5 mg daily started  -01/03/2023: last visit with Dr. Clelia Croft. Restarted  500 mg abiraterone daily.  -02/06/2023: transfer care to Dr. Leonides Schanz. Increased dose to 1000 mg abiraterone daily.  -04/11/2023: Due to increasing PSA transition to rucaparib 600 mg twice daily -07/03/2023: Held Rucaparib due to neutropenia and anemia.  -07/31/2023: Resume Rucaparib 300 mg PO BID.  -11/04/2023 PSMA with evidence of disease progression  -11/15/2023: PSA began rising 8.8 -11/18/2023: Increase Rucaparib to 300 mg  in AM and 600 mg in PM.  -11/25/2023: PSA increased to 11.2 -12/02/2023: PSA further increased 14.3 -01/02/2024: Cycle 1 of Pluvicto  PLAN: -Labs from 03/27/2024 were reviewed that require no intervention. WBC 4.2, Hgb 10.7, Plt 200, Creatinine and LFTs in range. PSA level has  improved prior to intiiating Pluvicto (39.2 to 5.7).  -Discussed Mr. Mcclintock goals of care and he is not interested in continuing with Pluvicto therapy or additional chemotherapy.  -He would like to continue with Lupron q 4 months and Xgeva q 56 days. Due for both on Monday.  -Continue to have ongoing GOC discussion with palliative care team.  -RTC in 12 weeks with repeat labs.   No orders of the defined types were placed in this encounter.   All questions were answered. The patient knows to call the clinic with any problems, questions or concerns. No barriers to learning were detected.   I have spent a total of 30 minutes minutes of face-to-face and non-face-to-face time, preparing to see the patient, obtaining and/or reviewing separately obtained history, performing a medically appropriate examination, counseling and educating the patient,communicating with other health care professionals, documenting clinical information in the electronic health record, independently interpreting results and communicating results to the patient, and care coordination.   Georga Kaufmann PA-C Dept of Hematology and Oncology [facility] Phone: [phone]

## 2024-04-01 ENCOUNTER — Encounter (HOSPITAL_COMMUNITY): Payer: Self-pay

## 2024-04-01 ENCOUNTER — Encounter: Payer: Self-pay | Admitting: Physician Assistant

## 2024-04-01 ENCOUNTER — Telehealth: Payer: Self-pay | Admitting: Gastroenterology

## 2024-04-01 ENCOUNTER — Inpatient Hospital Stay (HOSPITAL_BASED_OUTPATIENT_CLINIC_OR_DEPARTMENT_OTHER): Admitting: Physician Assistant

## 2024-04-01 ENCOUNTER — Encounter (HOSPITAL_COMMUNITY)
Admission: RE | Admit: 2024-04-01 | Discharge: 2024-04-01 | Disposition: A | Discharge status: Home / Self Care | Source: Ambulatory Visit | Attending: Physician Assistant | Admitting: Physician Assistant

## 2024-04-01 VITALS — BP 130/60 | HR 70 | Temp 97.5°F | Resp 18 | Ht 68.0 in | Wt 171.7 lb

## 2024-04-01 DIAGNOSIS — K4090 Unilateral inguinal hernia, without obstruction or gangrene, not specified as recurrent: Secondary | ICD-10-CM

## 2024-04-01 MED ORDER — SODIUM CHLORIDE (PF) 0.9 % IJ SOLN
INTRAMUSCULAR | Status: AC
  Filled 2024-04-01: qty 50

## 2024-04-01 MED ORDER — IOHEXOL 300 MG/ML  SOLN
100.0000 mL | Freq: Once | INTRAMUSCULAR | Status: AC | PRN
  Administered 2024-04-01: 100 mL via INTRAVENOUS

## 2024-04-01 NOTE — Progress Notes (Signed)
 Patient Care Team: Arnette Felts, FNP as PCP - General (General Practice) Pickenpack-Cousar, Arty Baumgartner, NP as Nurse Practitioner (Nurse Practitioner) Duffy, Pascal Lux, LCSW as Social Worker (Licensed Clinical Social Worker) Margaretmary Dys, MD as Consulting Physician (Radiation Oncology)   CHIEF COMPLAINT: Enlarging right inguinal hernia    INTERVAL HISTORY Frederick Ponce returns for a symptom management visit due to worsening right inguinal hernia today which is interfering with his urination. He reports his stream is more left sided. He has some discomfort with the hernia but not sharp pain. He denies any changes to his bowel habits. He denies fevers, chills, sweats, shortness of breath, chest pain or cough. He has no other complaints. Rest of the ROS is below.   ROS  All other systems reviewed and negative  Past Medical History:  Diagnosis Date   Arthritis    Depression    Family history of melanoma    Family history of prostate cancer    GERD (gastroesophageal reflux disease)    Hyperlipidemia    Hypertension    Parkinson disease (HCC)    Prostate cancer (HCC)      No past surgical history on file.   Outpatient Encounter Medications as of 04/01/2024  Medication Sig   acetaminophen (TYLENOL) 500 MG tablet Take 1,000 mg by mouth every 6 (six) hours as needed for mild pain (pain score 1-3) or moderate pain (pain score 4-6).   amLODipine (NORVASC) 5 MG tablet Take 1 tablet (5 mg total) by mouth daily.   atorvastatin (LIPITOR) 40 MG tablet TAKE 1 TABLET BY MOUTH EVERY MORNING   carbidopa-levodopa (SINEMET) 25-100 MG tablet Take 1 tablet by mouth 4 (four) times daily.   COENZYME Q10 PO Take 1 capsule by mouth every morning.   fentaNYL (DURAGESIC) 75 MCG/HR Place 1 patch onto the skin every other day.   guaiFENesin (MUCINEX) 600 MG 12 hr tablet Take 2 tablets (1,200 mg total) by mouth 2 (two) times daily as needed for cough or to loosen phlegm.   ibuprofen (ADVIL) 200 MG tablet Take  400 mg by mouth every 6 (six) hours as needed for mild pain (pain score 1-3) or moderate pain (pain score 4-6).   KRILL OIL PO Take 1 capsule by mouth every morning.   magic mouthwash w/lidocaine SOLN Take 10 mLs by mouth 4 (four) times daily as needed for mouth pain. Suspension contains equal amounts of Maalox Extra Strength, nystatin, diphenhydramine and lidocaine.   morphine (MSIR) 15 MG tablet Take 1 tablet (15 mg total) by mouth every 4 (four) hours as needed for moderate pain (pain score 4-6).   multivitamin-lutein (OCUVITE-LUTEIN) CAPS capsule Take 1 capsule by mouth every morning.   ondansetron (ZOFRAN) 4 MG tablet Take 1 tablet (4 mg total) by mouth every 6 (six) hours as needed for nausea.   No facility-administered encounter medications on file as of 04/01/2024.     Today's Vitals   04/01/24 0935  BP: 130/60  Pulse: 70  Resp: 18  Temp: (!) 97.5 F (36.4 C)  TempSrc: Tympanic  SpO2: 100%  Weight: 171 lb 11.2 oz (77.9 kg)  Height: 5\' 8"  (1.727 m)   Body mass index is 26.11 kg/m.   ECOG PERFORMANCE STATUS: 2  PHYSICAL EXAM Constitutional: Oriented to person, place, and time and well-developed, well-nourished, and in no distress.  Head: Normocephalic and atraumatic.   Musculoskeletal: Normal range of motion. Exhibits b/l lower extremity edema. Large right inguinal hernia that is reducible but causing some tenderness  with palpation. No erythema noted.  Neurological: Alert and oriented to person, place, and time.  Skin: Skin is warm and dry. No rash noted. Not diaphoretic. No erythema. No pallor.  Psychiatric: Mood, memory and judgment normal.   CBC    Component Value Date/Time   WBC 4.2 03/27/2024 1424   WBC 6.8 01/10/2024 0626   RBC 3.33 (L) 03/27/2024 1424   HGB 10.7 (L) 03/27/2024 1424   HCT 33.8 (L) 03/27/2024 1424   PLT 200 03/27/2024 1424   MCV 101.5 (H) 03/27/2024 1424   MCH 32.1 03/27/2024 1424   MCHC 31.7 03/27/2024 1424   RDW 14.8 03/27/2024 1424    LYMPHSABS 1.6 03/27/2024 1424   MONOABS 0.4 03/27/2024 1424   EOSABS 0.1 03/27/2024 1424   BASOSABS 0.0 03/27/2024 1424     CMP     Component Value Date/Time   NA 138 03/27/2024 1424   NA 140 02/10/2024 1042   K 4.9 03/27/2024 1424   CL 104 03/27/2024 1424   CO2 29 03/27/2024 1424   GLUCOSE 113 (H) 03/27/2024 1424   BUN 17 03/27/2024 1424   BUN 16 02/10/2024 1042   CREATININE 0.81 03/27/2024 1424   CALCIUM 9.5 03/27/2024 1424   PROT 7.1 03/27/2024 1424   PROT 6.3 09/14/2021 0920   ALBUMIN 4.4 03/27/2024 1424   ALBUMIN 4.2 09/14/2021 0920   AST 14 (L) 03/27/2024 1424   ALT <5 03/27/2024 1424   ALKPHOS 62 03/27/2024 1424   BILITOT 0.7 03/27/2024 1424   GFRNONAA >60 03/27/2024 1424     ASSESSMENT & PLAN:   #Right inguinal hernia: --Right inguinal hernia seen on last CT scan from 07/18/2023. --Due to sudden increase in size with some discomfort and inference with urination, we will obtain a STAT CT abdomen/pelvis to rule out strangulation and/or obstruction --Ct abdomen/pelvis scheduled for today.  --Will follow up by phone once results are available.   # Metastatic castrate sensitive prostate cancer. -01/15/22: CT-guided biopsy of a pulmonary nodule confirmed the presence of prostate cancer -01/23/2022:  Firmagon 240 mg started  -Feb 2023: radiation therapy to the left sacroiliac pelvic bones completed. He received 30 Gray in 10 fractions  -03/06/2022: Abiraterone 1000 mg daily with prednisone 5 mg daily started  -01/03/2023: last visit with Dr. Clelia Croft. Restarted 500 mg abiraterone daily.  -02/06/2023: transfer care to Dr. Leonides Schanz. Increased dose to 1000 mg abiraterone daily.  -04/11/2023: Due to increasing PSA transition to rucaparib 600 mg twice daily -07/03/2023: Held Rucaparib due to neutropenia and anemia.  -07/31/2023: Resume Rucaparib 300 mg PO BID.  -11/04/2023 PSMA with evidence of disease progression  -11/15/2023: PSA began rising 8.8 -11/18/2023: Increase Rucaparib to  300 mg  in AM and 600 mg in PM.  -11/25/2023: PSA increased to 11.2 -12/02/2023: PSA further increased 14.3 -01/02/2024: Cycle 1 of Pluvicto -01/31/2024: Patient decided to discontinue Pluvicto therapy and only continue on Lupron plus Xgeva injections.   Orders Placed This Encounter  Procedures   CT ABDOMEN PELVIS W CONTRAST    Standing Status:   Future    Expected Date:   04/01/2024    Expiration Date:   04/01/2025    If indicated for the ordered procedure, I authorize the administration of contrast media per Radiology protocol:   Yes    Does the patient have a contrast media/X-ray dye allergy?:   No    Preferred imaging location?:   [facility]    If indicated for the ordered procedure, I authorize the administration  of oral contrast media per Radiology protocol:   Yes    All questions were answered. The patient knows to call the clinic with any problems, questions or concerns. No barriers to learning were detected.   I have spent a total of 25 minutes minutes of face-to-face and non-face-to-face time, preparing to see the patient,  performing a medically appropriate examination, counseling and educating the patient,documenting clinical information in the electronic health record, independently interpreting results and communicating results to the patient, and care coordination.   Georga Kaufmann PA-C Dept of Hematology and Oncology [facility] Phone: [phone]

## 2024-04-01 NOTE — Telephone Encounter (Signed)
 Error

## 2024-04-02 ENCOUNTER — Ambulatory Visit: Admitting: Neurology

## 2024-04-02 ENCOUNTER — Encounter: Payer: Self-pay | Admitting: Neurology

## 2024-04-02 ENCOUNTER — Ambulatory Visit: Payer: Medicare Other | Admitting: Neurology

## 2024-04-02 VITALS — BP 126/72 | HR 76 | Ht 68.0 in | Wt 169.0 lb

## 2024-04-02 DIAGNOSIS — G20A1 Parkinson's disease without dyskinesia, without mention of fluctuations: Secondary | ICD-10-CM

## 2024-04-02 DIAGNOSIS — C7951 Secondary malignant neoplasm of bone: Secondary | ICD-10-CM

## 2024-04-02 DIAGNOSIS — C61 Malignant neoplasm of prostate: Secondary | ICD-10-CM

## 2024-04-02 MED ORDER — CARBIDOPA-LEVODOPA 25-100 MG PO TABS: 1.0000 | ORAL_TABLET | Freq: Four times a day (QID) | ORAL | 3 refills | Status: DC

## 2024-04-02 NOTE — Progress Notes (Signed)
 Patient: Frederick Ponce Date of Birth: Feb 22, 1946  Reason for Visit: Follow up History from: Patient, husband Primary Neurologist: Frances Furbish   ASSESSMENT AND PLAN 78 y.o. year old male   1.  Parkinson's disease 2.  Metastatic prostate cancer  - Overall, Parkinson's disease has remained stable.  Will continue current dose of Sinemet 25/100 mg, 1 tablet 4 times daily. He takes roughly every 6 hours.  He denies any wearing off.  He is trying to be active.  He is limited by chronic pain.  He continues to follow with palliative care.  Is on fentanyl and morphine. He will follow up in 6 months with me  HISTORY OF PRESENT ILLNESS: Today 04/02/24 Was admitted in Jan for PNA. Was treated with Pluvicto, strong chemo drug. He chose to stop it due to side effect, tells me nothing else to try, he doesn't want to experiment with anything else. Has right inguinal hernia, going to get CT scan. On Fentanyl patch for chronic pain. Takes morphine or Tylenol for breakthrough pain. Has neuropathy in his feet, no feeling in his fingers. Remains on Sinemet 25/100 mg every 6 hours, 9, 3, 9, 3. If he misses Sinemet, will notice the tremor. Tries to stay on schedule. Overall, there are days he actually feels okay. On days he feels good, he is active, housework, yard work. Occasionally see visual hallucination out of corner of eye.   09/05/23 SS: Currently taking Sinemet 25/100 mg 4 times daily at 9, 3, 9, 3.  He takes it this way because he always has, he tried taking 5th tablet but it made him nauseated, he went back to 4 a day.  Is noticing the medication is wearing off after about 4 hours, notes more tremor to the right hand.  It is bothersome to him.  Is on a new medication for prostate cancer Rubraca Has had a lot of side effects.  He has had anemia, leukopenia.  His main issue is chronic pain that is not well-controlled.  He is working with palliative care to titrate fentanyl patch.  Also has neuropathy.  Mentions  wanting to try gabapentin again, tried it once had significant confusion. He would like to go for walks, but the pain is limiting and fatigue. Main issue is relentless chronic pain.  He denies any freezing.  She had mentioned a friend of his taking Mirapex, wonder if this would be a good option for him.  02/11/23 SS: Here with husband, taking Sinemet 25/100 mg 1 tablet 4 times daily, 8AM, 1 PM, 8PM, 2 AM. He has few glasses of wine with dinner, doesn't want to take Sinemet at that time. Takes tablets every 5 hours. Notices when dose wearing off, more tremor, feel weak. Takes morphine for chronic pain. Is on Zytiga for prostate cancer. Recent PSA has increased. Side effect of acne. Today last took Sinemet 5 AM. No major changes with PD.  HISTORY  Today, 10/09/2022: He reports doing fairly well with increased levodopa, he increase it about 3 weeks ago to 1 pill 4 times a day.  He feels like he tolerates it, does not particularly cause of any indigestion or nausea but he has had GI trouble in the recent past.  He reports that he had COVID, he went to the emergency room for this.  I reviewed the emergency room records from 08/29/2022.  He presented with fever.  He was treated with molnupiravir.  He had more noticeable tremor after he had COVID and increasing  the levodopa was supposed to be just a trial by him but as he had sustained effect from it, he continued with 1 pill 4 times a day.  He generally takes it 6 hourly starting around 6 AM.  He has an appointment pending with urology as well as GI.  Some 6 weeks ago he stopped all his medications except for his heart medication and his levodopa.  He has had 3 rounds of radiation therapy, he is in follow-up with his oncologist on a regular basis.  He does take MS Contin.  He has had intermittent constipation.   REVIEW OF SYSTEMS: Out of a complete 14 system review of symptoms, the patient complains only of the following symptoms, and all other reviewed systems are  negative.  See HPI  ALLERGIES: Allergies  Allergen Reactions   Gabapentin Other (See Comments)    Restless legs/confusion; patient denies symptoms and ask that this be removed.   Celebrex [Celecoxib] Rash    HOME MEDICATIONS: Outpatient Medications Prior to Visit  Medication Sig Dispense Refill   acetaminophen (TYLENOL) 500 MG tablet Take 1,000 mg by mouth every 6 (six) hours as needed for mild pain (pain score 1-3) or moderate pain (pain score 4-6).     amLODipine (NORVASC) 5 MG tablet Take 1 tablet (5 mg total) by mouth daily. 90 tablet 1   atorvastatin (LIPITOR) 40 MG tablet TAKE 1 TABLET BY MOUTH EVERY MORNING 90 tablet 1   carbidopa-levodopa (SINEMET) 25-100 MG tablet Take 1 tablet by mouth 4 (four) times daily. 360 tablet 3   COENZYME Q10 PO Take 1 capsule by mouth every morning.     fentaNYL (DURAGESIC) 75 MCG/HR Place 1 patch onto the skin every other day. 15 patch 0   guaiFENesin (MUCINEX) 600 MG 12 hr tablet Take 2 tablets (1,200 mg total) by mouth 2 (two) times daily as needed for cough or to loosen phlegm. 60 tablet 0   ibuprofen (ADVIL) 200 MG tablet Take 400 mg by mouth every 6 (six) hours as needed for mild pain (pain score 1-3) or moderate pain (pain score 4-6).     KRILL OIL PO Take 1 capsule by mouth every morning.     magic mouthwash w/lidocaine SOLN Take 10 mLs by mouth 4 (four) times daily as needed for mouth pain. Suspension contains equal amounts of Maalox Extra Strength, nystatin, diphenhydramine and lidocaine. 400 mL 0   morphine (MSIR) 15 MG tablet Take 1 tablet (15 mg total) by mouth every 4 (four) hours as needed for moderate pain (pain score 4-6). 30 tablet 0   multivitamin-lutein (OCUVITE-LUTEIN) CAPS capsule Take 1 capsule by mouth every morning.     ondansetron (ZOFRAN) 4 MG tablet Take 1 tablet (4 mg total) by mouth every 6 (six) hours as needed for nausea. 20 tablet 0   No facility-administered medications prior to visit.    PAST MEDICAL  HISTORY: Past Medical History:  Diagnosis Date   Arthritis    Depression    Family history of melanoma    Family history of prostate cancer    GERD (gastroesophageal reflux disease)    Hyperlipidemia    Hypertension    Parkinson disease (HCC)    Prostate cancer (HCC)     PAST SURGICAL HISTORY: History reviewed. No pertinent surgical history.  FAMILY HISTORY: Family History  Problem Relation Age of Onset   Kidney disease Mother    Colon cancer Mother    Heart disease Father    Tremor Father  Parkinson's disease Father    Prostate cancer Father    Melanoma Sister    Parkinson's disease Sister    Melanoma Sister        melanoma x2   Prostate cancer Brother    Parkinson's disease Paternal Grandmother    Lung cancer Paternal Grandfather    Parkinson's disease Paternal Aunt    Lung cancer Paternal Aunt     SOCIAL HISTORY: Social History   Socioeconomic History   Marital status: Married    Spouse name: Janae Sauce   Number of children: 0   Years of education: Not on file   Highest education level: Some college, no degree  Occupational History   Not on file  Tobacco Use   Smoking status: Never   Smokeless tobacco: Never  Vaping Use   Vaping status: Never Used  Substance and Sexual Activity   Alcohol use: Yes    Alcohol/week: 15.0 standard drinks of alcohol    Types: 15 Glasses of wine per week    Comment: couple glasses a night   Drug use: Never   Sexual activity: Not Currently    Birth control/protection: None  Other Topics Concern   Not on file  Social History Narrative   Not on file   Social Drivers of Health   Financial Resource Strain: Low Risk  (02/09/2024)   Overall Financial Resource Strain (CARDIA)    Difficulty of Paying Living Expenses: Not hard at all  Food Insecurity: No Food Insecurity (02/09/2024)   Hunger Vital Sign    Worried About Running Out of Food in the Last Year: Never true    Ran Out of Food in the Last Year: Never true   Transportation Needs: No Transportation Needs (02/09/2024)   PRAPARE - Administrator, Civil Service (Medical): No    Lack of Transportation (Non-Medical): No  Physical Activity: Unknown (02/09/2024)   Exercise Vital Sign    Days of Exercise per Week: 0 days    Minutes of Exercise per Session: Not on file  Stress: Stress Concern Present (02/09/2024)   [employer] of Occupational Health - Occupational Stress Questionnaire    Feeling of Stress : To some extent  Social Connections: Moderately Isolated (02/09/2024)   Social Connection and Isolation Panel [NHANES]    Frequency of Communication with Friends and Family: More than three times a week    Frequency of Social Gatherings with Friends and Family: Once a week    Attends Religious Services: Never    Database administrator or Organizations: No    Attends Banker Meetings: Never    Marital Status: Married  Catering manager Violence: Not At Risk (01/06/2024)   Humiliation, Afraid, Rape, and Kick questionnaire    Fear of Current or Ex-Partner: No    Emotionally Abused: No    Physically Abused: No    Sexually Abused: No   PHYSICAL EXAM  Vitals:   04/02/24 1550  BP: 126/72  Pulse: 76  Weight: 169 lb (76.7 kg)  Height: 5\' 8"  (1.727 m)   Body mass index is 25.7 kg/m.  Generalized: Well developed, in no acute distress, mild masking of the face Neurological examination  Mentation: Alert oriented to time, place, history taking. Follows all commands speech and language fluent Cranial nerve II-XII: Pupils were equal round reactive to light. Extraocular movements were full, visual field were full on confrontational test. Facial sensation and strength were normal.  Head turning and shoulder shrug  were normal and  symmetric. Motor: The motor testing reveals 5 over 5 strength of all 4 extremities. Good symmetric motor tone is noted throughout.  Mild resting tremor to the right hand noted.  Mild bradykinesia to  right upper extremity more than left. Sensory: Sensory testing is intact to soft touch on all 4 extremities. No evidence of extinction is noted.  Coordination: Cerebellar testing reveals good finger-nose-finger and heel-to-shin bilaterally.  Mild resting tremor to the right hand Gait and station: Can stand from seated position independently but has to push off, decreased arm swing on the right, gait is cautious Reflexes: Deep tendon reflexes are symmetric and normal bilaterally.   DIAGNOSTIC DATA (LABS, IMAGING, TESTING) - I reviewed patient records, labs, notes, testing and imaging myself where available.  Lab Results  Component Value Date   WBC 4.2 03/27/2024   HGB 10.7 (L) 03/27/2024   HCT 33.8 (L) 03/27/2024   MCV 101.5 (H) 03/27/2024   PLT 200 03/27/2024      Component Value Date/Time   NA 138 03/27/2024 1424   NA 140 02/10/2024 1042   K 4.9 03/27/2024 1424   CL 104 03/27/2024 1424   CO2 29 03/27/2024 1424   GLUCOSE 113 (H) 03/27/2024 1424   BUN 17 03/27/2024 1424   BUN 16 02/10/2024 1042   CREATININE 0.81 03/27/2024 1424   CALCIUM 9.5 03/27/2024 1424   PROT 7.1 03/27/2024 1424   PROT 6.3 09/14/2021 0920   ALBUMIN 4.4 03/27/2024 1424   ALBUMIN 4.2 09/14/2021 0920   AST 14 (L) 03/27/2024 1424   ALT <5 03/27/2024 1424   ALKPHOS 62 03/27/2024 1424   BILITOT 0.7 03/27/2024 1424   GFRNONAA >60 03/27/2024 1424   Lab Results  Component Value Date   CHOL 171 02/10/2024   HDL 71 02/10/2024   LDLCALC 86 02/10/2024   TRIG 75 02/10/2024   CHOLHDL 2.4 02/10/2024   Lab Results  Component Value Date   HGBA1C 5.9 (H) 02/20/2023   Lab Results  Component Value Date   VITAMINB12 1,651 (H) 02/20/2023   Lab Results  Component Value Date   TSH 1.520 02/20/2023    Margie Ege, AGNP-C, DNP 04/02/2024, 4:00 PM [facility] [address] [place], [place] [address] [phone]

## 2024-04-02 NOTE — Patient Instructions (Signed)
 Nice to see you today. Continue Sinemet at current dose Try to be active  Follow up in 6 months

## 2024-04-06 ENCOUNTER — Telehealth: Payer: Self-pay

## 2024-04-06 ENCOUNTER — Other Ambulatory Visit: Payer: Self-pay

## 2024-04-06 DIAGNOSIS — K4090 Unilateral inguinal hernia, without obstruction or gangrene, not specified as recurrent: Secondary | ICD-10-CM

## 2024-04-06 NOTE — Telephone Encounter (Signed)
 STAT referral faxed to [facility] for Rt Inguinal hernia.    Confirmation received and pt advised

## 2024-04-09 ENCOUNTER — Other Ambulatory Visit: Payer: Self-pay | Admitting: Nurse Practitioner

## 2024-04-09 DIAGNOSIS — G893 Neoplasm related pain (acute) (chronic): Secondary | ICD-10-CM

## 2024-04-09 DIAGNOSIS — C7802 Secondary malignant neoplasm of left lung: Secondary | ICD-10-CM

## 2024-04-09 DIAGNOSIS — Z515 Encounter for palliative care: Secondary | ICD-10-CM

## 2024-04-09 DIAGNOSIS — C61 Malignant neoplasm of prostate: Secondary | ICD-10-CM

## 2024-04-09 MED ORDER — FENTANYL 75 MCG/HR TD PT72
1.0000 | MEDICATED_PATCH | TRANSDERMAL | 0 refills | Status: DC
Start: 2024-04-09 — End: 2024-05-05

## 2024-04-14 ENCOUNTER — Inpatient Hospital Stay (HOSPITAL_BASED_OUTPATIENT_CLINIC_OR_DEPARTMENT_OTHER): Admitting: Nurse Practitioner

## 2024-04-14 DIAGNOSIS — Z515 Encounter for palliative care: Secondary | ICD-10-CM | POA: Diagnosis not present

## 2024-04-14 DIAGNOSIS — M792 Neuralgia and neuritis, unspecified: Secondary | ICD-10-CM | POA: Diagnosis not present

## 2024-04-14 DIAGNOSIS — G893 Neoplasm related pain (acute) (chronic): Secondary | ICD-10-CM | POA: Diagnosis not present

## 2024-04-14 DIAGNOSIS — C61 Malignant neoplasm of prostate: Secondary | ICD-10-CM

## 2024-04-14 DIAGNOSIS — C7951 Secondary malignant neoplasm of bone: Secondary | ICD-10-CM

## 2024-04-15 ENCOUNTER — Encounter: Payer: Self-pay | Admitting: Hematology and Oncology

## 2024-04-15 ENCOUNTER — Encounter: Payer: Self-pay | Admitting: Nurse Practitioner

## 2024-04-15 NOTE — Progress Notes (Signed)
 Palliative Medicine [facility]  Telephone:(336) [phone] Fax:(336) [phone]   Name: Frederick Ponce Date: 04/15/2024 MRN: [id number]  DOB: Jul 31, 1946  Patient Care Team: Susanna Epley, FNP as PCP - General (General Practice) Pickenpack-Cousar, Giles Labrum, NP as Nurse Practitioner (Nurse Practitioner) Claudio Culver, Freya Jesus, LCSW as Social Worker (Licensed Clinical Social Worker) Kenith Payer, MD as Consulting Physician (Radiation Oncology)   I connected with Frederick Ponce on 04/14/24 at 11:30 AM EDT by telephone and verified that I am speaking with the correct person using two identifiers.   I discussed the limitations, risks, security and privacy concerns of performing an evaluation and management service by telemedicine and the availability of in-person appointments. I also discussed with the patient that there may be a patient responsible charge related to this service. The patient expressed understanding and agreed to proceed.   Other persons participating in the visit and their role in the encounter: N/A   Patient's location: Home  Provider's location: [facility]   REASON FOR CONSULTATION: Frederick Ponce is a 78 y.o. male with medical history including Parkinson's disease and hyperlipidemia. 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 He is having urgent radiation appointment due to iliac bone pain. Palliative ask to see for symptom management.   SOCIAL HISTORY:     reports that he has never smoked. He has never used smokeless tobacco. He reports current alcohol  use of about 15.0 standard drinks of alcohol  per week. He reports that he does not use drugs.  ADVANCE DIRECTIVES:  Patient is scheduled to complete advanced directives at our clinic here at the cancer center on 02/16/2022. MOST form provided.  Patient plans to review and complete at a later  time.  CODE STATUS:   PAST MEDICAL HISTORY: Past Medical History:  Diagnosis Date   Arthritis    Depression    Family history of melanoma    Family history of prostate cancer    GERD (gastroesophageal reflux disease)    Hyperlipidemia    Hypertension    Parkinson disease (HCC)    Prostate cancer (HCC)     PAST SURGICAL HISTORY: No past surgical history on file.  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  The report date is December 21, 2022.  The CancerNext-Expanded gene panel offered by [facility] and includes sequencing and rearrangement analysis for the following 77 genes: AIP, ALK, APC*, ATM*, AXIN2, BAP1, BARD1, BLM, BMPR1A, BRCA1*, BRCA2*, BRIP1*, CDC73, CDH1*, CDK4, CDKN1B, CDKN2A, CHEK2*, CTNNA1, DICER1, FANCC, FH, FLCN, GALNT12, KIF1B, LZTR1, MAX, MEN1, MET, MLH1*, MSH2*, MSH3, MSH6*, MUTYH*, NBN, NF1*, NF2, NTHL1, PALB2*, PHOX2B, PMS2*, POT1, PRKAR1A, PTCH1, PTEN*, RAD51C*, RAD51D*, RB1, RECQL, RET, SDHA, SDHAF2, SDHB, SDHC, SDHD, SMAD4, SMARCA4, SMARCB1, SMARCE1, STK11, SUFU, TMEM127, TP53*, TSC1, TSC2, VHL and XRCC2 (sequencing and deletion/duplication); EGFR, EGLN1, HOXB13, KIT, MITF, PDGFRA, POLD1, and POLE (sequencing only); EPCAM and GREM1 (deletion/duplication only). DNA and RNA analyses performed for * genes.  ALLERGIES:  is allergic to gabapentin  and celebrex  [celecoxib ].  MEDICATIONS:  Current Outpatient Medications  Medication Sig Dispense Refill   acetaminophen  (TYLENOL )  500 MG tablet Take 1,000 mg by mouth every 6 (six) hours as needed for mild pain (pain score 1-3) or moderate pain (pain score 4-6).     amLODipine  (NORVASC ) 5 MG tablet Take 1 tablet (5 mg total) by mouth daily. 90 tablet 1   atorvastatin  (LIPITOR) 40 MG tablet TAKE 1 TABLET BY MOUTH EVERY MORNING 90 tablet 1   carbidopa -levodopa  (SINEMET ) 25-100 MG tablet Take 1 tablet by mouth 4 (four) times daily. 360 tablet 3   COENZYME Q10 PO Take 1 capsule by mouth every morning.     fentaNYL  (DURAGESIC ) 75 MCG/HR Place 1 patch onto the skin every other day. 15 patch 0   guaiFENesin  (MUCINEX ) 600 MG 12 hr tablet Take 2 tablets (1,200 mg total) by mouth 2 (two) times daily as needed for cough or to loosen phlegm. 60 tablet 0   ibuprofen  (ADVIL ) 200 MG tablet Take 400 mg by mouth every 6 (six) hours as needed for mild pain (pain score 1-3) or moderate pain (pain score 4-6).     KRILL OIL PO Take 1 capsule by mouth every morning.     magic mouthwash w/lidocaine  SOLN Take 10 mLs by mouth 4 (four) times daily as needed for mouth pain. Suspension contains equal amounts of Maalox Extra Strength, nystatin, diphenhydramine  and lidocaine . 400 mL 0   morphine  (MSIR) 15 MG tablet Take 1 tablet (15 mg total) by mouth every 4 (four) hours as needed for moderate pain (pain score 4-6). 30 tablet 0   multivitamin-lutein (OCUVITE-LUTEIN) CAPS capsule Take 1 capsule by mouth every morning.     ondansetron  (ZOFRAN ) 4 MG tablet Take 1 tablet (4 mg total) by mouth every 6 (six) hours as needed for nausea. 20 tablet 0   No current facility-administered medications for this visit.    PERFORMANCE STATUS (ECOG) : 1 - Symptomatic but completely ambulatory  Discussed the use of AI scribe software for clinical note transcription with the patient, who gave verbal consent to proceed.   History of Present Illness Frederick Ponce "Frederick Ponce" is a 78 year old male with chronic neuropathy who I connected by phone to discuss his concerns  about his current pain management regimen. Denies concerns of nausea, vomiting, constipation, or diarrhea. Is trying to remain as active as possible.   He experiences significant discomfort primarily due to neuropathy, which is inadequately managed by his current medications. His sleep is poor, waking up multiple times at night due to severe leg pain. Unfortunately Mr. Jasperson is unable to tolerate gabapentin , cymbalta , or pregabalin  for his neuropathic discomfort which has made managing his symptoms challenge while being mindful of his Parkinson's disorder also. We could potentially consider trial of nortriptyline for his neuropathic symptoms if no contradictions with his levodopa .   Frederick Ponce is currently using a fentanyl  patch 75mcg, which he feels is ineffective for his neuropathy and pain. Without the patch, he does not feel well, but he questions its benefit given the lack of relief for his neuropathy. Previous use of morphine  er tablets was more effective, despite causing constipation. He takes Advil  and Tylenol  for pain relief, which he finds helpful, but Advil  causes stomach upset. He has been taking one Tylenol  and one Advil  together at times, which seems to work for him. He is concerned about the efficacy and side effects of his current medication regimen, including feelings  of confusion or disorientation. We discussed at length weaning down off of fentanyl  patch and potentially transitioning back to MS Contin . Education provided on what this regimen would look like over the course of the next month. Patient recently picked up his Fentanyl  patches 3 days ago and after discussions will continue and make adjustments once prescription has run out. I did recommend increasing frequency to every 3 days versus 2 days. He verbalized understanding and appreciation.   We will continue to closely monitor and support. Assessment & Plan Chronic Pain/Neuropathy Fentanyl  patch ineffective for neuropathy pain. Prefers  morphine  despite constipation. Concerned about cost and potential side effects of continuing on fentanyl . Transition requires careful titration to allow cross tolerance as patient prefers to restart MS Contin .  - Titrate fentanyl  patch from 75 mcg to 50 mcg, then to 25 mcg before switching to morphine . - Extend fentanyl  patch application to every three days to assess symptom improvement. - Plan morphine  initiation post-titration. - Can consider nortriptyline for neuropathic symptoms. Will research appropriateness as it relates to Parkinson's medications.  -Continue Tylenol  or Advil  as needed.  -Will need bowel regimen daily once MS Contin  restarted. Senna S 2 tablets at bedtime. Miralax  twice daily.   Sleep disturbance Neuropathy pain disrupts sleep, causing frequent awakenings and need for movement. Current pain management insufficient.  I will plan to follow-up with patient in 2-3 weeks. Sooner if needed.   Patient expressed understanding and was in agreement with this plan. He also understands that He can call the clinic at any time with any questions, concerns, or complaints.    Any controlled substances utilized were prescribed in the context of palliative care. PDMP has been reviewed.    I provided 45 minutes of non face-to-face telephone visit time during this encounter, and > 50% was spent counseling as documented under my assessment & plan. Visit consisted of counseling and education dealing with the complex and emotionally intense issues of symptom management and palliative care in the setting of serious and potentially life-threatening illness.  Dellia Ferguson, AGPCNP-BC  Palliative Medicine Team/[facility]

## 2024-04-20 ENCOUNTER — Other Ambulatory Visit: Payer: Self-pay | Admitting: *Deleted

## 2024-04-20 MED ORDER — MELOXICAM 7.5 MG PO TABS: 7.5000 mg | ORAL_TABLET | Freq: Every day | ORAL | 1 refills | Status: DC

## 2024-04-23 ENCOUNTER — Other Ambulatory Visit: Payer: Self-pay | Admitting: Nurse Practitioner

## 2024-04-23 DIAGNOSIS — E785 Hyperlipidemia, unspecified: Secondary | ICD-10-CM

## 2024-05-01 ENCOUNTER — Ambulatory Visit: Payer: Self-pay | Admitting: General Surgery

## 2024-05-05 ENCOUNTER — Other Ambulatory Visit: Payer: Self-pay | Admitting: Nurse Practitioner

## 2024-05-05 DIAGNOSIS — C7802 Secondary malignant neoplasm of left lung: Secondary | ICD-10-CM

## 2024-05-05 DIAGNOSIS — Z515 Encounter for palliative care: Secondary | ICD-10-CM

## 2024-05-05 DIAGNOSIS — G893 Neoplasm related pain (acute) (chronic): Secondary | ICD-10-CM

## 2024-05-05 DIAGNOSIS — C61 Malignant neoplasm of prostate: Secondary | ICD-10-CM

## 2024-05-05 MED ORDER — MORPHINE SULFATE 15 MG PO TABS: 15.0000 mg | ORAL_TABLET | ORAL | 0 refills | Status: DC | PRN

## 2024-05-05 MED ORDER — FENTANYL 75 MCG/HR TD PT72: 1.0000 | MEDICATED_PATCH | TRANSDERMAL | 0 refills | Status: DC

## 2024-05-07 ENCOUNTER — Telehealth: Payer: Self-pay

## 2024-05-07 NOTE — Telephone Encounter (Signed)
 Called patient to see how long she had medicare- if less than a year she needs Welcome to medicare with Provider- If over a year patient needs AWV ( CAN BE ON FRIDAY CMA [facility])- LVM   PATIENT NEEDS AWV

## 2024-05-11 ENCOUNTER — Other Ambulatory Visit: Payer: Self-pay | Admitting: Nurse Practitioner

## 2024-05-11 DIAGNOSIS — C7951 Secondary malignant neoplasm of bone: Secondary | ICD-10-CM

## 2024-05-11 DIAGNOSIS — C61 Malignant neoplasm of prostate: Secondary | ICD-10-CM

## 2024-05-12 ENCOUNTER — Inpatient Hospital Stay

## 2024-05-12 ENCOUNTER — Inpatient Hospital Stay: Attending: Nurse Practitioner | Admitting: Nurse Practitioner

## 2024-05-12 ENCOUNTER — Encounter: Payer: Self-pay | Admitting: Nurse Practitioner

## 2024-05-12 VITALS — BP 137/62 | HR 83 | Temp 98.2°F | Resp 19 | Wt 174.8 lb

## 2024-05-12 DIAGNOSIS — G893 Neoplasm related pain (acute) (chronic): Secondary | ICD-10-CM | POA: Diagnosis not present

## 2024-05-12 DIAGNOSIS — C7802 Secondary malignant neoplasm of left lung: Secondary | ICD-10-CM

## 2024-05-12 DIAGNOSIS — Z79899 Other long term (current) drug therapy: Secondary | ICD-10-CM | POA: Diagnosis not present

## 2024-05-12 DIAGNOSIS — C7801 Secondary malignant neoplasm of right lung: Secondary | ICD-10-CM

## 2024-05-12 DIAGNOSIS — Z515 Encounter for palliative care: Secondary | ICD-10-CM

## 2024-05-12 DIAGNOSIS — C7800 Secondary malignant neoplasm of unspecified lung: Secondary | ICD-10-CM | POA: Diagnosis not present

## 2024-05-12 DIAGNOSIS — K5903 Drug induced constipation: Secondary | ICD-10-CM

## 2024-05-12 DIAGNOSIS — C61 Malignant neoplasm of prostate: Secondary | ICD-10-CM

## 2024-05-12 DIAGNOSIS — C7951 Secondary malignant neoplasm of bone: Secondary | ICD-10-CM | POA: Insufficient documentation

## 2024-05-12 DIAGNOSIS — Z923 Personal history of irradiation: Secondary | ICD-10-CM | POA: Diagnosis not present

## 2024-05-12 DIAGNOSIS — R530 Neoplastic (malignant) related fatigue: Secondary | ICD-10-CM

## 2024-05-12 DIAGNOSIS — L03116 Cellulitis of left lower limb: Secondary | ICD-10-CM

## 2024-05-12 DIAGNOSIS — D649 Anemia, unspecified: Secondary | ICD-10-CM | POA: Diagnosis not present

## 2024-05-12 LAB — CBC WITH DIFFERENTIAL (CANCER CENTER ONLY)
Abs Immature Granulocytes: 0.01 10*3/uL (ref 0.00–0.07)
Basophils Absolute: 0 10*3/uL (ref 0.0–0.1)
Basophils Relative: 0 %
Eosinophils Absolute: 0.1 10*3/uL (ref 0.0–0.5)
Eosinophils Relative: 3 %
HCT: 28.6 % — ABNORMAL LOW (ref 39.0–52.0)
Hemoglobin: 9.2 g/dL — ABNORMAL LOW (ref 13.0–17.0)
Immature Granulocytes: 0 %
Lymphocytes Relative: 21 %
Lymphs Abs: 1.1 10*3/uL (ref 0.7–4.0)
MCH: 31.9 pg (ref 26.0–34.0)
MCHC: 32.2 g/dL (ref 30.0–36.0)
MCV: 99.3 fL (ref 80.0–100.0)
Monocytes Absolute: 0.6 10*3/uL (ref 0.1–1.0)
Monocytes Relative: 12 %
Neutro Abs: 3.3 10*3/uL (ref 1.7–7.7)
Neutrophils Relative %: 64 %
Platelet Count: 320 10*3/uL (ref 150–400)
RBC: 2.88 MIL/uL — ABNORMAL LOW (ref 4.22–5.81)
RDW: 13.5 % (ref 11.5–15.5)
WBC Count: 5.1 10*3/uL (ref 4.0–10.5)
nRBC: 0 % (ref 0.0–0.2)

## 2024-05-12 LAB — COMPREHENSIVE METABOLIC PANEL WITH GFR
ALT: <5 U/L (ref 0–44)
AST: 11 U/L — ABNORMAL LOW (ref 15–41)
Albumin: 3.8 g/dL (ref 3.5–5.0)
Alkaline Phosphatase: 63 U/L (ref 38–126)
Anion gap: 6 (ref 5–15)
BUN: 19 mg/dL (ref 8–23)
CO2: 27 mmol/L (ref 22–32)
Calcium: 8.9 mg/dL (ref 8.9–10.3)
Chloride: 103 mmol/L (ref 98–111)
Creatinine, Ser: 0.95 mg/dL (ref 0.61–1.24)
GFR, Estimated: >60 mL/min (ref >60)
Glucose, Bld: 118 mg/dL — ABNORMAL HIGH (ref 70–99)
Potassium: 4.3 mmol/L (ref 3.5–5.1)
Sodium: 136 mmol/L (ref 135–145)
Total Bilirubin: 0.8 mg/dL (ref 0.0–1.2)
Total Protein: 6.7 g/dL (ref 6.5–8.1)

## 2024-05-12 MED ORDER — DOXYCYCLINE HYCLATE 100 MG PO TABS: 100.0000 mg | ORAL_TABLET | Freq: Two times a day (BID) | ORAL | 0 refills | Status: DC

## 2024-05-12 MED ORDER — KETOROLAC TROMETHAMINE 10 MG PO TABS
10.0000 mg | ORAL_TABLET | Freq: Three times a day (TID) | ORAL | 0 refills | Status: DC | PRN
Start: 2024-05-12 — End: 2024-06-17

## 2024-05-12 NOTE — Progress Notes (Signed)
 Palliative Medicine [facility]  Telephone:(336) [phone] Fax:(336) [phone]   Name: Lynk Marti Date: 05/12/2024 MRN: [id number]  DOB: [birth year]  Patient Care Team: Susanna Epley, FNP as PCP - General (General Practice) Pickenpack-Cousar, Giles Labrum, NP as Nurse Practitioner (Nurse Practitioner) Claudio Culver, Freya Jesus, LCSW as Social Worker (Licensed Clinical Social Worker) Kenith Payer, MD as Consulting Physician (Radiation Oncology)    REASON FOR CONSULTATION: Kedar Sedano is a 78 y.o. male with medical history including Parkinson's disease and hyperlipidemia. 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 He is having urgent radiation appointment due to iliac bone pain. Palliative ask to see for symptom management.   SOCIAL HISTORY:     reports that he has never smoked. He has never used smokeless tobacco. He reports current alcohol  use of about 15.0 standard drinks of alcohol  per week. He reports that he does not use drugs.  ADVANCE DIRECTIVES:  Patient is scheduled to complete advanced directives at our clinic here at the cancer center on 02/16/2022. MOST form provided.  Patient plans to review and complete at a later time.  CODE STATUS:   PAST MEDICAL HISTORY: Past Medical History:  Diagnosis Date   Arthritis    Depression    Family history of melanoma    Family history of prostate cancer    GERD (gastroesophageal reflux disease)    Hyperlipidemia    Hypertension    Parkinson disease (HCC)    Prostate cancer (HCC)     PAST SURGICAL HISTORY: History reviewed. No pertinent surgical history.  HEMATOLOGY/ONCOLOGY HISTORY:  Oncology History  Prostate 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 (HCC)  01/18/2022 Initial Diagnosis   Prostate 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 (HCC)   02/20/2022 - 02/20/2022 Chemotherapy   Patient is on Treatment Plan : PROSTATE  Docetaxel q21d     05/23/2022 Cancer Staging   Staging form: Prostate, AJCC 8th Edition - Clinical: Stage IVB (cTX, cNX, cM1c) - Signed by Renna Cary, MD on 05/23/2022    Genetic Testing   BRCA2 p.Z3086* (c.4965C>G) pathogenic variant on the CancerNext-Expanded+RNAinsight panel.  The report date is December 21, 2022.  The CancerNext-Expanded gene panel offered by [facility] and includes sequencing and rearrangement analysis for the following 77 genes: AIP, ALK, APC*, ATM*, AXIN2, BAP1, BARD1, BLM, BMPR1A, BRCA1*, BRCA2*, BRIP1*, CDC73, CDH1*, CDK4, CDKN1B, CDKN2A, CHEK2*, CTNNA1, DICER1, FANCC, FH, FLCN, GALNT12, KIF1B, LZTR1, MAX, MEN1, MET, MLH1*, MSH2*, MSH3, MSH6*, MUTYH*, NBN, NF1*, NF2, NTHL1, PALB2*, PHOX2B, PMS2*, POT1, PRKAR1A, PTCH1, PTEN*, RAD51C*, RAD51D*, RB1, RECQL, RET, SDHA, SDHAF2, SDHB, SDHC, SDHD, SMAD4, SMARCA4, SMARCB1, SMARCE1, STK11, SUFU, TMEM127, TP53*, TSC1, TSC2, VHL and XRCC2 (sequencing and deletion/duplication); EGFR, EGLN1, HOXB13, KIT, MITF, PDGFRA, POLD1, and POLE (sequencing only); EPCAM and GREM1 (deletion/duplication only). DNA and RNA analyses performed for * genes.      ALLERGIES:  is allergic to gabapentin  and celebrex  [celecoxib ].  MEDICATIONS:  Current Outpatient Medications  Medication Sig Dispense Refill   doxycycline (VIBRA-TABS) 100 MG tablet Take 1 tablet (100 mg total) by mouth 2 (two) times daily for 14 days. 28 tablet 0   ketorolac  (TORADOL ) 10 MG tablet Take 1 tablet (10 mg total) by mouth every 8 (eight) hours as needed. 45 tablet 0   acetaminophen  (TYLENOL ) 500 MG tablet Take 1,000 mg by mouth every 6 (six) hours as needed for mild pain (pain score 1-3) or moderate pain (pain score 4-6).  amLODipine  (NORVASC ) 5 MG tablet Take 1 tablet (5 mg total) by mouth daily. 90 tablet 1   atorvastatin  (LIPITOR) 40 MG tablet TAKE 1 TABLET BY MOUTH EVERY MORNING 90 tablet 1   carbidopa -levodopa  (SINEMET ) 25-100 MG tablet Take 1 tablet by mouth 4  (four) times daily. 360 tablet 3   COENZYME Q10 PO Take 1 capsule by mouth every morning.     fentaNYL  (DURAGESIC ) 75 MCG/HR Place 1 patch onto the skin every other day. 15 patch 0   guaiFENesin  (MUCINEX ) 600 MG 12 hr tablet Take 2 tablets (1,200 mg total) by mouth 2 (two) times daily as needed for cough or to loosen phlegm. 60 tablet 0   KRILL OIL PO Take 1 capsule by mouth every morning.     magic mouthwash w/lidocaine  SOLN Take 10 mLs by mouth 4 (four) times daily as needed for mouth pain. Suspension contains equal amounts of Maalox Extra Strength, nystatin, diphenhydramine  and lidocaine . 400 mL 0   morphine  (MSIR) 15 MG tablet Take 1 tablet (15 mg total) by mouth every 4 (four) hours as needed for moderate pain (pain score 4-6). 30 tablet 0   multivitamin-lutein (OCUVITE-LUTEIN) CAPS capsule Take 1 capsule by mouth every morning.     ondansetron  (ZOFRAN ) 4 MG tablet Take 1 tablet (4 mg total) by mouth every 6 (six) hours as needed for nausea. 20 tablet 0   No current facility-administered medications for this visit.   Assessment NAD, Fatigue Normal breathing pattern RRR Bilateral lower extremity edema L>R, redness AAO x3    PERFORMANCE STATUS (ECOG) : 1 - Symptomatic but completely ambulatory  Discussed the use of AI scribe software for clinical note transcription with the patient, who gave verbal consent to proceed.   History of Present Illness Montoya Brandel "Michaeleen Adler" is a 78 year old male who presents to clinic for symptom management follow-up.  Denies concerns of nausea, vomiting, or diarrhea. Is trying to remain as active as possible.   [name] states he is scheduled for hernia surgery on June 24th and has been experiencing fresh blood from hemorrhoids. He uses MiraLax  once daily for stool softening and applies a preparation agent for hemorrhoid relief. Education provided on increasing Miralax  to twice daily. He verbalized understanding.  We will continue to closely monitor and  support.  He has significant swelling in his legs, particularly the left leg, which began approximately two weeks ago. The swelling has made it difficult for him to walk, and he is unable to wear his regular shoes due to the size of his legs. His legs are wrapped with bandages every morning, by Myrtie Atkinson which provides some relief. He describes his legs as red and painful, and he applies Aquaphor cream to the affected areas after showering. He is concerned about the erythema and swelling, noting that the skin feels tight and stretched. He has a history of radiation treatment and notes that his left leg has always been more affected since then which he attributes some of the edema to. After his initial radiation treatment, he experienced relief, but the swelling has since returned.Education provided on concerns for cellulitis and management. Will consider use of antibiotic for further management. He and family verbalized understanding.   [name] is currently using a fentanyl  patch 75mcg, which he reports works for [employer] He is also taking MS IR as needed for uncontrolled pain. Most recently started taking Toradol  at night, which provides immediate and consistent relief for about six hours. We discussed at length  ongoing use of Toradol  and potential side effects in addition to contraindications of medication uses. He verbalized understanding. No adjustments to current regimen at this time. We will plan to continue close monitoring.   Patient is requesting lab evaluation. We will assess kidney function in setting of use of Toradol , PSA, and CBC as patient reports increasing fatigue.  All questions answered and support provided.  Assessment & Plan Leg swelling and erythema Swelling and erythema of the left leg, possibly related to past radiation therapy. Differential includes cellulitis. No signs of infection or skin breakdown. - Prescribe doxycycline for potential cellulitis. - Send prescription to  [facility] pharmacy. - Advise leg elevation when sitting. - Apply Aquaphor cream nightly post-shower.  Chronic pain management Chronic pain managed with Toradol , Tylenol , and morphine . Toradol  provides significant relief but is used sparingly due to potential nephrotoxicity. Tylenol  preferred over Advil  due to reduced renal impact. - Order CMP to assess renal function. - Prescribe Toradol  for occasional use, not daily. - Advise using Tylenol  for analgesia. - Continue use of fentanyl  75 mcg patch. - Continue with MS IR 15 mg as needed for moderate to severe pain.  Hemorrhoids with bleeding Bleeding hemorrhoids with fresh blood. Increase in MiraLax  frequency considered to improve symptoms. - Increase MiraLax  to twice daily. - Continue using preparation agent for hemorrhoids.  Hernia (planned surgery) Scheduled hernia surgery on June 24 at [facility].  Follow-up Follow-up planned to review lab results and renal function. - Perform lab tests including PSA and CMP. - Schedule follow-up in a 3-4 weeks.  Sooner if needed.  Patient expressed understanding and was in agreement with this plan. He also understands that He can call the clinic at any time with any questions, concerns, or complaints.    Any controlled substances utilized were prescribed in the context of palliative care. PDMP has been reviewed.   Visit consisted of counseling and education dealing with the complex and emotionally intense issues of symptom management and palliative care in the setting of serious and potentially life-threatening illness.  Dellia Ferguson, AGPCNP-BC  Palliative Medicine Team/[facility]

## 2024-05-13 ENCOUNTER — Telehealth: Payer: Self-pay | Admitting: *Deleted

## 2024-05-13 LAB — PROSTATE-SPECIFIC AG, SERUM (LABCORP): Prostate Specific Ag, Serum: 60.3 ng/mL — ABNORMAL HIGH (ref 0.0–4.0)

## 2024-05-13 NOTE — Telephone Encounter (Signed)
 Received call back from pt. Explained the reason for the earlier call was concern hat he had re-started the Rubraca  (he had left over tablets from last year) without consulting Dr, Rosaline Coma and without his knowledge. Advised that he should not be taking this medication as his prostate cancer had progressed while on it and it has toxicities related to decreasing his blood counts. Pt states he didn't know what else to do. He is aware that his PSA is rising. He is continuing Eligard  every 4 months. Advised that when he has questions about treatment/treatment outcomes etc that he needs to call us  and not take medications without approval from his oncologist. Advised that I can arrange for him to discuss his concerns with Dr. Rosaline Coma. He is agreeable to this. Appt made for Friday, 05/15/24 @ 3:20 pm  Dr. Rosaline Coma made aware.

## 2024-05-13 NOTE — Telephone Encounter (Signed)
 See previous note

## 2024-05-13 NOTE — Telephone Encounter (Signed)
 TCT patient to discuss his medications. He saw Aretha Beers, NP in Palliative Care and revealed to her that he has restarted his Rucaparib (Rubraca ) on his own with out discussing this with Dr. Rosaline Coma. Pt's disease progressed on this medication and it was discontinued in December 2024.  It had been held prior to that due to cytopenias. Dr. Rosaline Coma was made aware that pt resumed this medication at home without first discussing with Dr. Rosaline Coma. Dr. Rosaline Coma states pt should not be on this medication. No answer but was able to leave vm message for pt to return this call today.@ [phone]

## 2024-05-15 ENCOUNTER — Inpatient Hospital Stay (HOSPITAL_BASED_OUTPATIENT_CLINIC_OR_DEPARTMENT_OTHER): Admitting: Hematology and Oncology

## 2024-05-15 VITALS — BP 142/64 | HR 91 | Temp 98.7°F | Resp 16 | Wt 175.0 lb

## 2024-05-15 DIAGNOSIS — C61 Malignant neoplasm of prostate: Secondary | ICD-10-CM | POA: Diagnosis not present

## 2024-05-15 DIAGNOSIS — C7951 Secondary malignant neoplasm of bone: Secondary | ICD-10-CM | POA: Diagnosis not present

## 2024-05-16 ENCOUNTER — Encounter: Payer: Self-pay | Admitting: Hematology and Oncology

## 2024-05-16 NOTE — Progress Notes (Signed)
 [facility] [phone]   [phone]  PROGRESS NOTE  Patient Care Team: Susanna Epley, FNP as PCP - General (General Practice) Pickenpack-Cousar, Giles Labrum, NP as Nurse Practitioner (Nurse Practitioner) Claudio Culver, Freya Jesus, LCSW as Social Worker (Licensed Clinical Social Worker) Kenith Payer, MD as Consulting Physician (Radiation Oncology)  Hematological/Oncological History # Metastatic castrate sensitive prostate cancer. 01/15/22: CT-guided biopsy of a pulmonary nodule confirmed the presence of prostate cancer 01/23/2022:  Firmagon  240 mg started  Feb 2023: radiation therapy to the left sacroiliac pelvic bones completed. He received 30 Gray in 10 fractions  03/06/2022: Abiraterone  1000 mg daily with prednisone  5 mg daily started  01/03/2023: last visit with Dr. Dirk Fredericks. Restarted 500 mg abiraterone  daily.  02/06/2023: transfer care to Dr. Rosaline Coma. Increased dose to 1000 mg abiraterone  daily.  04/11/2023: Due to increasing PSA transition to rucaparib 600 mg twice daily 07/03/2023: Held Rucaparib due to neutropenia and anemia.  07/31/2023: Resume Rucaparib 300 mg PO BID.  11/18/2023: Increase Rucaparib to 300 mg  in AM and 600 mg in PM.  12/02/2023: PSA further increased 14.3 01/02/2024: Cycle 1 of Pluvicto  01/31/2024: Patient decided to discontinue Pluvicto  therapy and only continue on Lupron  plus Xgeva  injections.   Interval History:  Frederick Ponce 78 y.o. 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 The patient's last visit was on 11/07/2023. In the interim, he has continued Rucaparib.  Today he is accompanied by his husband.  On exam today Frederick Ponce reports he remains quite unhappy and feels very poorly.  He reports that he is "on a lot of pain relievers".  He notes that it is causing imbalances in his health system and that his stomachs is "in a state of  flux".  He does have some occasional spit up and nausea.  He reports he also has hard coughing.  He reports that his bowels are moving regularly.  Due to the redness of his lower extremities he was prescribed doxycycline for 14-day course.  He notes that he had a terrible experience with Pluvicto  and does not wish to attempt that again.  He has started taking his home Rubraca  without discussing with us  initially.  He notes that overall he feels poorly and feels like he be willing to consider alternative treatments given that he already feels bad.  We discussed treatment options (the limited ones that remain) and noted that chemotherapy would be the next recommendation.  He does not wish to pursue chemotherapy and is disheartened by the fact that there are not other viable options for treatment.  He would like a CT scan.  He denies any fevers, chills, sweats.  A full 10 point ROS is otherwise negative.  MEDICAL HISTORY:  Past Medical History:  Diagnosis Date   Arthritis    Depression    Family history of melanoma    Family history of prostate cancer    GERD (gastroesophageal reflux disease)    Hyperlipidemia    Hypertension    Parkinson disease (HCC)    Prostate cancer (HCC)     SURGICAL HISTORY: No past surgical history on file.  SOCIAL HISTORY: Social History   Socioeconomic History   Marital status: Married    Spouse name: Laine Piggs   Number of children: 0   Years of education: Not on file   Highest education level: Some college, no degree  Occupational History   Not on  file  Tobacco Use   Smoking status: Never   Smokeless tobacco: Never  Vaping Use   Vaping status: Never Used  Substance and Sexual Activity   Alcohol  use: Yes    Alcohol /week: 15.0 standard drinks of alcohol     Types: 15 Glasses of wine per week    Comment: couple glasses a night   Drug use: Never   Sexual activity: Not Currently    Birth control/protection: None  Other Topics Concern   Not on file   Social History Narrative   Not on file   Social Drivers of Health   Financial Resource Strain: Low Risk  (02/09/2024)   Overall Financial Resource Strain (CARDIA)    Difficulty of Paying Living Expenses: Not hard at all  Food Insecurity: No Food Insecurity (02/09/2024)   Hunger Vital Sign    Worried About Running Out of Food in the Last Year: Never true    Ran Out of Food in the Last Year: Never true  Transportation Needs: No Transportation Needs (02/09/2024)   PRAPARE - Administrator, Civil Service (Medical): No    Lack of Transportation (Non-Medical): No  Physical Activity: Unknown (02/09/2024)   Exercise Vital Sign    Days of Exercise per Week: 0 days    Minutes of Exercise per Session: Not on file  Stress: Stress Concern Present (02/09/2024)   [employer] of Occupational Health - Occupational Stress Questionnaire    Feeling of Stress : To some extent  Social Connections: Moderately Isolated (02/09/2024)   Social Connection and Isolation Panel [NHANES]    Frequency of Communication with Friends and Family: More than three times a week    Frequency of Social Gatherings with Friends and Family: Once a week    Attends Religious Services: Never    Database administrator or Organizations: No    Attends Banker Meetings: Never    Marital Status: Married  Catering manager Violence: Not At Risk (01/06/2024)   Humiliation, Afraid, Rape, and Kick questionnaire    Fear of Current or Ex-Partner: No    Emotionally Abused: No    Physically Abused: No    Sexually Abused: No    FAMILY HISTORY: Family History  Problem Relation Age of Onset   Kidney disease Mother    Colon cancer Mother    Heart disease Father    Tremor Father    Parkinson's disease Father    Prostate cancer Father    Melanoma Sister    Parkinson's disease Sister    Melanoma Sister        melanoma x2   Prostate cancer Brother    Parkinson's disease Paternal Grandmother    Lung cancer  Paternal Grandfather    Parkinson's disease Paternal Aunt    Lung cancer Paternal Aunt     ALLERGIES:  is allergic to gabapentin  and celebrex  [celecoxib ].  MEDICATIONS:  Current Outpatient Medications  Medication Sig Dispense Refill   acetaminophen  (TYLENOL ) 500 MG tablet Take 1,000 mg by mouth every 6 (six) hours as needed for mild pain (pain score 1-3) or moderate pain (pain score 4-6).     amLODipine  (NORVASC ) 5 MG tablet Take 1 tablet (5 mg total) by mouth daily. 90 tablet 1   atorvastatin  (LIPITOR) 40 MG tablet TAKE 1 TABLET BY MOUTH EVERY MORNING 90 tablet 1   carbidopa -levodopa  (SINEMET ) 25-100 MG tablet Take 1 tablet by mouth 4 (four) times daily. 360 tablet 3   COENZYME Q10 PO Take 1 capsule  by mouth every morning.     doxycycline (VIBRA-TABS) 100 MG tablet Take 1 tablet (100 mg total) by mouth 2 (two) times daily for 14 days. 28 tablet 0   fentaNYL  (DURAGESIC ) 75 MCG/HR Place 1 patch onto the skin every other day. 15 patch 0   guaiFENesin  (MUCINEX ) 600 MG 12 hr tablet Take 2 tablets (1,200 mg total) by mouth 2 (two) times daily as needed for cough or to loosen phlegm. 60 tablet 0   ketorolac  (TORADOL ) 10 MG tablet Take 1 tablet (10 mg total) by mouth every 8 (eight) hours as needed. 45 tablet 0   KRILL OIL PO Take 1 capsule by mouth every morning.     magic mouthwash w/lidocaine  SOLN Take 10 mLs by mouth 4 (four) times daily as needed for mouth pain. Suspension contains equal amounts of Maalox Extra Strength, nystatin, diphenhydramine  and lidocaine . 400 mL 0   morphine  (MSIR) 15 MG tablet Take 1 tablet (15 mg total) by mouth every 4 (four) hours as needed for moderate pain (pain score 4-6). 30 tablet 0   multivitamin-lutein (OCUVITE-LUTEIN) CAPS capsule Take 1 capsule by mouth every morning.     ondansetron  (ZOFRAN ) 4 MG tablet Take 1 tablet (4 mg total) by mouth every 6 (six) hours as needed for nausea. 20 tablet 0   No current facility-administered medications for this visit.     REVIEW OF SYSTEMS:   Constitutional: ( - ) fevers, ( - )  chills , ( - ) night sweats Eyes: ( - ) blurriness of vision, ( - ) double vision, ( - ) watery eyes Ears, nose, mouth, throat, and face: ( - ) mucositis, ( - ) sore throat Respiratory: ( - ) cough, ( - ) dyspnea, ( - ) wheezes Cardiovascular: ( - ) palpitation, ( - ) chest discomfort, ( - ) lower extremity swelling Gastrointestinal:  ( - ) nausea, ( - ) heartburn, ( - ) change in bowel habits Skin: ( - ) abnormal skin rashes Lymphatics: ( - ) new lymphadenopathy, ( - ) easy bruising Neurological: ( - ) numbness, ( - ) tingling, ( - ) new weaknesses Behavioral/Psych: ( - ) mood change, ( - ) new changes  All other systems were reviewed with the patient and are negative.  PHYSICAL EXAMINATION: ECOG PERFORMANCE STATUS: 1 - Symptomatic but completely ambulatory  Vitals:   05/15/24 1524  BP: (!) 142/64  Pulse: 91  Resp: 16  Temp: 98.7 F (37.1 C)  SpO2: 92%        Filed Weights   05/15/24 1524  Weight: 175 lb (79.4 kg)     GENERAL: Well-appearing elderly Caucasian male, alert, no distress and comfortable SKIN: skin color, texture, turgor are normal, no rashes or significant lesions EYES: conjunctiva are pink and non-injected, sclera clear LUNGS: clear to auscultation and percussion with normal breathing effort HEART: regular rate & rhythm and no murmurs and no lower extremity edema Musculoskeletal: no cyanosis of digits and no clubbing  PSYCH: alert & oriented x 3, fluent speech NEURO: no focal motor/sensory deficits  LABORATORY DATA:  I have reviewed the data as listed    Latest Ref Rng & Units 05/12/2024    2:36 PM 03/27/2024    2:24 PM 03/19/2024    3:18 PM  CBC  WBC 4.0 - 10.5 K/uL 5.1  4.2  3.9   Hemoglobin 13.0 - 17.0 g/dL 9.2  10.2  72.5   Hematocrit 39.0 - 52.0 % 28.6  33.8  34.0   Platelets 150 - 400 K/uL 320  200  209        Latest Ref Rng & Units 05/12/2024    2:20 PM 03/27/2024    2:24 PM  03/19/2024    3:18 PM  CMP  Glucose 70 - 99 mg/dL 161  096  045   BUN 8 - 23 mg/dL 19  17  19    Creatinine 0.61 - 1.24 mg/dL 4.09  8.11  9.14   Sodium 135 - 145 mmol/L 136  138  137   Potassium 3.5 - 5.1 mmol/L 4.3  4.9  4.2   Chloride 98 - 111 mmol/L 103  104  105   CO2 22 - 32 mmol/L 27  29  29    Calcium  8.9 - 10.3 mg/dL 8.9  9.5  9.6   Total Protein 6.5 - 8.1 g/dL 6.7  7.1  7.2   Total Bilirubin 0.0 - 1.2 mg/dL 0.8  0.7  0.8   Alkaline Phos 38 - 126 U/L 63  62  61   AST 15 - 41 U/L 11  14  14    ALT 0 - 44 U/L <5  <5  5     Lab Results  Component Value Date   MPROTEIN Not Observed 12/27/2021   Lab Results  Component Value Date   KPAFRELGTCHN 16.0 12/27/2021   LAMBDASER 12.2 12/27/2021   KAPLAMBRATIO 1.31 12/27/2021     RADIOGRAPHIC STUDIES: No results found.  ASSESSMENT & PLAN Frederick Ponce is a 78 y.o. 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  # Metastatic castrate sensitive prostate cancer. # Prostate cancer metastatic to the lung --Labs on 09/24/2023 show white blood cell 5.1, hemoglobin 9.2, MCV 99.3, platelets 320 --NM PSMA scan on 11/04/2023 which showed evidence of progression of disease. --Patient unable to tolerate Rubraca  due to severe cytopenias and poor response.  Additionally he was hospitalized after his first dose of Pluvicto  due to pneumonitis thought to be secondary to radiation. --Patient is adamant he does not wish to pursue chemotherapy --Patient is alarmed by the rising PSA and returns to us  today for consideration of other treatments.  We have exhausted all treatment options does not include chemotherapy. --Patient would like to follow with us  on a more close basis.  Additionally he would like a CT scan in order to determine how disease has progressed. --Last Eligard  and Xgeva  on 03/30/2024.  If he would like to continue the next dose would be  in August 2025. --RTC in 4 weeks for labs/follow up  Orders Placed This Encounter  Procedures   CT CHEST ABDOMEN PELVIS W CONTRAST    Standing Status:   Future    Expected Date:   05/23/2024    Expiration Date:   05/16/2025    If indicated for the ordered procedure, I authorize the administration of contrast media per Radiology protocol:   Yes    Does the patient have a contrast media/X-ray dye allergy?:   No    Preferred imaging location?:   [facility], [place]    If indicated for the ordered procedure, I authorize the administration of oral contrast media per Radiology protocol:   Yes   All questions were answered. The patient knows to call the clinic with any problems, questions or concerns.  I have spent a total of 30 minutes minutes of face-to-face and non-face-to-face time, preparing to see  the patient, performing a medically appropriate examination, counseling and educating the patient, documenting clinical information in the electronic health record, independently interpreting results and communicating results to the patient, and care coordination.   Rogerio Clay, MD Department of Hematology/Oncology [facility] at [facility] Phone: [phone] Pager: [phone] Email: [email or web address]   05/16/2024 4:48 PM

## 2024-05-23 ENCOUNTER — Other Ambulatory Visit: Payer: Self-pay

## 2024-05-23 ENCOUNTER — Emergency Department (HOSPITAL_COMMUNITY)

## 2024-05-23 ENCOUNTER — Inpatient Hospital Stay (HOSPITAL_COMMUNITY)
Admission: EM | Admit: 2024-05-23 | Discharge: 2024-05-26 | DRG: 948 | Disposition: A | Discharge status: Hospice | Attending: Internal Medicine | Admitting: Internal Medicine

## 2024-05-23 ENCOUNTER — Encounter (HOSPITAL_COMMUNITY): Payer: Self-pay

## 2024-05-23 DIAGNOSIS — Z888 Allergy status to other drugs, medicaments and biological substances status: Secondary | ICD-10-CM

## 2024-05-23 DIAGNOSIS — Z79891 Long term (current) use of opiate analgesic: Secondary | ICD-10-CM

## 2024-05-23 DIAGNOSIS — R52 Pain, unspecified: Principal | ICD-10-CM

## 2024-05-23 DIAGNOSIS — Z820 Family history of epilepsy and other diseases of the nervous system: Secondary | ICD-10-CM

## 2024-05-23 DIAGNOSIS — G893 Neoplasm related pain (acute) (chronic): Principal | ICD-10-CM | POA: Diagnosis present

## 2024-05-23 DIAGNOSIS — C7802 Secondary malignant neoplasm of left lung: Secondary | ICD-10-CM | POA: Diagnosis present

## 2024-05-23 DIAGNOSIS — Z808 Family history of malignant neoplasm of other organs or systems: Secondary | ICD-10-CM

## 2024-05-23 DIAGNOSIS — Z923 Personal history of irradiation: Secondary | ICD-10-CM

## 2024-05-23 DIAGNOSIS — I700 Atherosclerosis of aorta: Secondary | ICD-10-CM | POA: Diagnosis present

## 2024-05-23 DIAGNOSIS — D630 Anemia in neoplastic disease: Secondary | ICD-10-CM | POA: Diagnosis present

## 2024-05-23 DIAGNOSIS — R112 Nausea with vomiting, unspecified: Secondary | ICD-10-CM

## 2024-05-23 DIAGNOSIS — I10 Essential (primary) hypertension: Secondary | ICD-10-CM | POA: Diagnosis present

## 2024-05-23 DIAGNOSIS — R627 Adult failure to thrive: Secondary | ICD-10-CM | POA: Diagnosis present

## 2024-05-23 DIAGNOSIS — C7801 Secondary malignant neoplasm of right lung: Secondary | ICD-10-CM | POA: Diagnosis present

## 2024-05-23 DIAGNOSIS — Z515 Encounter for palliative care: Secondary | ICD-10-CM

## 2024-05-23 DIAGNOSIS — K5903 Drug induced constipation: Secondary | ICD-10-CM | POA: Diagnosis present

## 2024-05-23 DIAGNOSIS — R053 Chronic cough: Secondary | ICD-10-CM | POA: Diagnosis present

## 2024-05-23 DIAGNOSIS — K219 Gastro-esophageal reflux disease without esophagitis: Secondary | ICD-10-CM | POA: Diagnosis present

## 2024-05-23 DIAGNOSIS — Z800 Family history of malignant neoplasm of digestive organs: Secondary | ICD-10-CM

## 2024-05-23 DIAGNOSIS — C7951 Secondary malignant neoplasm of bone: Secondary | ICD-10-CM | POA: Diagnosis present

## 2024-05-23 DIAGNOSIS — R0902 Hypoxemia: Secondary | ICD-10-CM | POA: Diagnosis present

## 2024-05-23 DIAGNOSIS — Z801 Family history of malignant neoplasm of trachea, bronchus and lung: Secondary | ICD-10-CM

## 2024-05-23 DIAGNOSIS — Z79899 Other long term (current) drug therapy: Secondary | ICD-10-CM

## 2024-05-23 DIAGNOSIS — Z9221 Personal history of antineoplastic chemotherapy: Secondary | ICD-10-CM

## 2024-05-23 DIAGNOSIS — Z8249 Family history of ischemic heart disease and other diseases of the circulatory system: Secondary | ICD-10-CM

## 2024-05-23 DIAGNOSIS — C61 Malignant neoplasm of prostate: Secondary | ICD-10-CM | POA: Diagnosis present

## 2024-05-23 DIAGNOSIS — Z66 Do not resuscitate: Secondary | ICD-10-CM | POA: Diagnosis present

## 2024-05-23 DIAGNOSIS — G20A1 Parkinson's disease without dyskinesia, without mention of fluctuations: Secondary | ICD-10-CM | POA: Diagnosis present

## 2024-05-23 DIAGNOSIS — G629 Polyneuropathy, unspecified: Secondary | ICD-10-CM

## 2024-05-23 DIAGNOSIS — R509 Fever, unspecified: Secondary | ICD-10-CM | POA: Diagnosis present

## 2024-05-23 DIAGNOSIS — Z8042 Family history of malignant neoplasm of prostate: Secondary | ICD-10-CM

## 2024-05-23 DIAGNOSIS — K5909 Other constipation: Secondary | ICD-10-CM | POA: Diagnosis present

## 2024-05-23 DIAGNOSIS — M7989 Other specified soft tissue disorders: Secondary | ICD-10-CM | POA: Diagnosis present

## 2024-05-23 DIAGNOSIS — E785 Hyperlipidemia, unspecified: Secondary | ICD-10-CM | POA: Diagnosis present

## 2024-05-23 DIAGNOSIS — G2581 Restless legs syndrome: Secondary | ICD-10-CM | POA: Diagnosis present

## 2024-05-23 LAB — CBC WITH DIFFERENTIAL/PLATELET
Abs Immature Granulocytes: 0.02 10*3/uL (ref 0.00–0.07)
Basophils Absolute: 0 10*3/uL (ref 0.0–0.1)
Basophils Relative: 0 %
Eosinophils Absolute: 0 10*3/uL (ref 0.0–0.5)
Eosinophils Relative: 0 %
HCT: 30 % — ABNORMAL LOW (ref 39.0–52.0)
Hemoglobin: 9.4 g/dL — ABNORMAL LOW (ref 13.0–17.0)
Immature Granulocytes: 0 %
Lymphocytes Relative: 20 %
Lymphs Abs: 1.2 10*3/uL (ref 0.7–4.0)
MCH: 32.1 pg (ref 26.0–34.0)
MCHC: 31.3 g/dL (ref 30.0–36.0)
MCV: 102.4 fL — ABNORMAL HIGH (ref 80.0–100.0)
Monocytes Absolute: 0.6 10*3/uL (ref 0.1–1.0)
Monocytes Relative: 11 %
Neutro Abs: 3.8 10*3/uL (ref 1.7–7.7)
Neutrophils Relative %: 69 %
Platelets: 245 10*3/uL (ref 150–400)
RBC: 2.93 MIL/uL — ABNORMAL LOW (ref 4.22–5.81)
RDW: 14.1 % (ref 11.5–15.5)
WBC: 5.6 10*3/uL (ref 4.0–10.5)
nRBC: 0 % (ref 0.0–0.2)

## 2024-05-23 LAB — COMPREHENSIVE METABOLIC PANEL WITH GFR
ALT: <5 U/L (ref 0–44)
AST: 13 U/L — ABNORMAL LOW (ref 15–41)
Albumin: 3.1 g/dL — ABNORMAL LOW (ref 3.5–5.0)
Alkaline Phosphatase: 61 U/L (ref 38–126)
Anion gap: 8 (ref 5–15)
BUN: 14 mg/dL (ref 8–23)
CO2: 23 mmol/L (ref 22–32)
Calcium: 8.1 mg/dL — ABNORMAL LOW (ref 8.9–10.3)
Chloride: 100 mmol/L (ref 98–111)
Creatinine, Ser: 0.73 mg/dL (ref 0.61–1.24)
GFR, Estimated: >60 mL/min (ref >60)
Glucose, Bld: 121 mg/dL — ABNORMAL HIGH (ref 70–99)
Potassium: 4 mmol/L (ref 3.5–5.1)
Sodium: 131 mmol/L — ABNORMAL LOW (ref 135–145)
Total Bilirubin: 1 mg/dL (ref 0.0–1.2)
Total Protein: 6.3 g/dL — ABNORMAL LOW (ref 6.5–8.1)

## 2024-05-23 LAB — URINALYSIS, W/ REFLEX TO CULTURE (INFECTION SUSPECTED)
Bacteria, UA: NONE SEEN
Bilirubin Urine: NEGATIVE
Glucose, UA: NEGATIVE mg/dL
Hgb urine dipstick: NEGATIVE
Ketones, ur: 5 mg/dL — AB
Leukocytes,Ua: NEGATIVE
Nitrite: NEGATIVE
Protein, ur: NEGATIVE mg/dL
Specific Gravity, Urine: 1.011 (ref 1.005–1.030)
pH: 6 (ref 5.0–8.0)

## 2024-05-23 LAB — BLOOD GAS, VENOUS
Acid-base deficit: 0.2 mmol/L (ref 0.0–2.0)
Bicarbonate: 23.9 mmol/L (ref 20.0–28.0)
O2 Saturation: 86.4 %
Patient temperature: 37
pCO2, Ven: 36 mmHg — ABNORMAL LOW (ref 44–60)
pH, Ven: 7.43 (ref 7.25–7.43)
pO2, Ven: 48 mmHg — ABNORMAL HIGH (ref 32–45)

## 2024-05-23 LAB — PROTIME-INR
INR: 1.1 (ref 0.8–1.2)
Prothrombin Time: 14.1 s (ref 11.4–15.2)

## 2024-05-23 LAB — I-STAT CG4 LACTIC ACID, ED
Lactic Acid, Venous: 1.1 mmol/L (ref 0.5–1.9)
Lactic Acid, Venous: 0.8 mmol/L (ref 0.5–1.9)

## 2024-05-23 MED ORDER — ATORVASTATIN CALCIUM 40 MG PO TABS
40.0000 mg | ORAL_TABLET | Freq: Every morning | ORAL | Status: DC
  Administered 2024-05-23 – 2024-05-26 (×4): 40 mg via ORAL
  Filled 2024-05-23 (×3): qty 1

## 2024-05-23 MED ORDER — ONDANSETRON HCL 4 MG/2ML IJ SOLN
4.0000 mg | Freq: Once | INTRAMUSCULAR | Status: AC
  Administered 2024-05-23: 4 mg via INTRAVENOUS
  Filled 2024-05-23: qty 2

## 2024-05-23 MED ORDER — ACETAMINOPHEN 650 MG RE SUPP: 650.0000 mg | Freq: Four times a day (QID) | RECTAL | Status: DC | PRN

## 2024-05-23 MED ORDER — FENTANYL 75 MCG/HR TD PT72: 1.0000 | MEDICATED_PATCH | TRANSDERMAL | Status: DC

## 2024-05-23 MED ORDER — ONDANSETRON HCL 4 MG PO TABS
4.0000 mg | ORAL_TABLET | Freq: Four times a day (QID) | ORAL | Status: DC | PRN
  Administered 2024-05-23 – 2024-05-26 (×7): 4 mg via ORAL
  Filled 2024-05-23 (×7): qty 1

## 2024-05-23 MED ORDER — FLEET ENEMA RE ENEM: 1.0000 | ENEMA | Freq: Once | RECTAL | Status: DC

## 2024-05-23 MED ORDER — IBUPROFEN 800 MG PO TABS
800.0000 mg | ORAL_TABLET | Freq: Once | ORAL | Status: AC
  Administered 2024-05-23: 800 mg via ORAL
  Filled 2024-05-23: qty 1

## 2024-05-23 MED ORDER — ORAL CARE MOUTH RINSE: 15.0000 mL | OROMUCOSAL | Status: DC | PRN

## 2024-05-23 MED ORDER — LACTATED RINGERS IV BOLUS
1000.0000 mL | Freq: Once | INTRAVENOUS | Status: AC
Start: 2024-05-23 — End: 2024-05-23
  Administered 2024-05-23: 1000 mL via INTRAVENOUS

## 2024-05-23 MED ORDER — IOHEXOL 350 MG/ML SOLN
100.0000 mL | Freq: Once | INTRAVENOUS | Status: AC | PRN
  Administered 2024-05-23: 100 mL via INTRAVENOUS

## 2024-05-23 MED ORDER — MORPHINE SULFATE 15 MG PO TABS: 15.0000 mg | ORAL_TABLET | ORAL | Status: DC | PRN

## 2024-05-23 MED ORDER — ENOXAPARIN SODIUM 40 MG/0.4ML IJ SOSY
40.0000 mg | PREFILLED_SYRINGE | INTRAMUSCULAR | Status: DC
  Filled 2024-05-23 (×2): qty 0.4

## 2024-05-23 MED ORDER — KETOROLAC TROMETHAMINE 15 MG/ML IJ SOLN: 15.0000 mg | Freq: Three times a day (TID) | INTRAMUSCULAR | Status: DC | PRN

## 2024-05-23 MED ORDER — HYDROMORPHONE HCL 1 MG/ML IJ SOLN
1.0000 mg | INTRAMUSCULAR | Status: DC | PRN
  Administered 2024-05-23 – 2024-05-25 (×6): 1 mg via INTRAVENOUS
  Filled 2024-05-23 (×6): qty 1

## 2024-05-23 MED ORDER — MORPHINE SULFATE 10 MG/5ML PO SOLN
5.0000 mg | ORAL | Status: DC | PRN
  Administered 2024-05-24 (×2): 5 mg via ORAL
  Filled 2024-05-23 (×2): qty 5

## 2024-05-23 MED ORDER — CARBIDOPA-LEVODOPA 25-100 MG PO TABS
1.0000 | ORAL_TABLET | Freq: Four times a day (QID) | ORAL | Status: DC
  Administered 2024-05-23 – 2024-05-26 (×12): 1 via ORAL
  Filled 2024-05-23 (×11): qty 1

## 2024-05-23 MED ORDER — ONDANSETRON HCL 4 MG/2ML IJ SOLN
4.0000 mg | Freq: Four times a day (QID) | INTRAMUSCULAR | Status: DC | PRN
  Administered 2024-05-23: 4 mg via INTRAVENOUS
  Filled 2024-05-23: qty 2

## 2024-05-23 MED ORDER — ENSURE PLUS HIGH PROTEIN PO LIQD
237.0000 mL | Freq: Two times a day (BID) | ORAL | Status: DC
  Administered 2024-05-24 – 2024-05-26 (×2): 237 mL via ORAL

## 2024-05-23 MED ORDER — HYDROMORPHONE HCL 1 MG/ML IJ SOLN
1.0000 mg | Freq: Once | INTRAMUSCULAR | Status: AC
  Administered 2024-05-23: 1 mg via INTRAVENOUS
  Filled 2024-05-23: qty 1

## 2024-05-23 MED ORDER — HYDROMORPHONE HCL 1 MG/ML IJ SOLN
0.5000 mg | Freq: Once | INTRAMUSCULAR | Status: AC
  Administered 2024-05-23: 0.5 mg via INTRAVENOUS
  Filled 2024-05-23: qty 1

## 2024-05-23 MED ORDER — ACETAMINOPHEN 325 MG PO TABS
650.0000 mg | ORAL_TABLET | Freq: Four times a day (QID) | ORAL | Status: DC | PRN
  Administered 2024-05-24: 650 mg via ORAL
  Filled 2024-05-23: qty 2

## 2024-05-23 MED ORDER — DOXYCYCLINE HYCLATE 100 MG PO TABS
100.0000 mg | ORAL_TABLET | Freq: Two times a day (BID) | ORAL | Status: DC
  Administered 2024-05-23 – 2024-05-26 (×6): 100 mg via ORAL
  Filled 2024-05-23 (×6): qty 1

## 2024-05-23 MED ORDER — GUAIFENESIN ER 600 MG PO TB12: 600.0000 mg | ORAL_TABLET | Freq: Every evening | ORAL | Status: DC | PRN

## 2024-05-23 NOTE — ED Notes (Signed)
 Nurse assisted patient into wheelchair and to restroom to have BM. JRPRN

## 2024-05-23 NOTE — ED Provider Notes (Signed)
 [place] EMERGENCY DEPARTMENT AT [facility] Provider Note   CSN: [id number] Arrival date & time: 05/23/24  0745     History {Add pertinent medical, surgical, social history, OB history to HPI:1} Chief Complaint  Patient presents with   Fever    Frederick Ponce is a 78 y.o. male who presents for fever and body aches. 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  Patient was previously on chemotherapy and radiation for his prostate however states that he has declined any further treatment and is currently with palliative care.  Patient is also on chronic pain control with fentanyl  patch and morphine  for breakthrough pain.  Patient has bilateral lower extremity swelling pain and redness.  His partner who is a nurse states that while they were in the hospital palliative care was concerned he could have cellulitis and started him on doxycycline  which has not improved his symptoms.  He has a chronic cough.  He woke up with a fever this morning. Has hx of chronic constipation. Pain is very poorly controlled and he  is barely able to get out of bed for ADLs due to his severe pain.   Fever      Home Medications Prior to Admission medications   Medication Sig Start Date End Date Taking? Authorizing Provider  acetaminophen  (TYLENOL ) 500 MG tablet Take 1,000 mg by mouth every 6 (six) hours as needed for mild pain (pain score 1-3) or moderate pain (pain score 4-6).    [provider]  amLODipine  (NORVASC ) 5 MG tablet Take 1 tablet (5 mg total) by mouth daily. 08/09/23   Susanna Epley, FNP  atorvastatin  (LIPITOR) 40 MG tablet TAKE 1 TABLET BY MOUTH EVERY MORNING 04/23/24   Moore, Janece, FNP  carbidopa -levodopa  (SINEMET ) 25-100 MG tablet Take 1 tablet by mouth 4 (four) times daily. 04/02/24   Wess Hammed, NP  COENZYME Q10 PO Take 1 capsule  by mouth every morning.    [provider]  doxycycline  (VIBRA -TABS) 100 MG tablet Take 1 tablet (100 mg total) by mouth 2 (two) times daily for 14 days. 05/12/24 05/26/24  Pickenpack-Cousar, Athena N, NP  fentaNYL  (DURAGESIC ) 75 MCG/HR Place 1 patch onto the skin every other day. 05/05/24   Pickenpack-Cousar, Athena N, NP  guaiFENesin  (MUCINEX ) 600 MG 12 hr tablet Take 2 tablets (1,200 mg total) by mouth 2 (two) times daily as needed for cough or to loosen phlegm. 01/10/24   [name], MD  ketorolac  (TORADOL ) 10 MG tablet Take 1 tablet (10 mg total) by mouth every 8 (eight) hours as needed. 05/12/24   Pickenpack-Cousar, Athena N, NP  KRILL OIL PO Take 1 capsule by mouth every morning.    [provider]  magic mouthwash w/lidocaine  SOLN Take 10 mLs by mouth 4 (four) times daily as needed for mouth pain. Suspension contains equal amounts of Maalox Extra Strength, nystatin, diphenhydramine  and lidocaine . 01/10/24   [name], MD  morphine  (MSIR) 15 MG tablet Take 1 tablet (15 mg total) by mouth every 4 (four) hours as needed for moderate pain (pain score 4-6). 05/05/24   Pickenpack-Cousar, Athena N, NP  multivitamin-lutein (OCUVITE-LUTEIN) CAPS capsule Take 1 capsule by mouth every morning.    [provider]  ondansetron  (ZOFRAN ) 4 MG tablet Take 1 tablet (4 mg total) by mouth every 6 (six) hours as needed for nausea. 01/10/24   [name], MD  Allergies    Gabapentin  and Celebrex  [celecoxib ]    Review of Systems   Review of Systems  Constitutional:  Positive for fever.    Physical Exam Updated Vital Signs BP 118/70 (BP Location: Left Arm)   Pulse 99   Temp 99.1 F (37.3 C) (Oral)   Resp 16   Ht 5\' 8"  (1.727 m)   Wt 79.4 kg   SpO2 97%   BMI 26.62 kg/m  Physical Exam Vitals and nursing note reviewed.  Constitutional:      General: He is not in acute distress.    Appearance: He is well-developed. He is ill-appearing and toxic-appearing. He is  not diaphoretic.     Comments: Flushed cheeks  HENT:     Head: Normocephalic and atraumatic.  Eyes:     General: No scleral icterus.    Conjunctiva/sclera: Conjunctivae normal.  Cardiovascular:     Rate and Rhythm: Normal rate and regular rhythm.     Heart sounds: Normal heart sounds.  Pulmonary:     Effort: Pulmonary effort is normal. Tachypnea present. No respiratory distress.     Breath sounds: Wheezing and rhonchi present.  Abdominal:     Palpations: Abdomen is soft.     Tenderness: There is no abdominal tenderness.  Musculoskeletal:     Cervical back: Normal range of motion and neck supple.     Comments: BL lower extremity edema , erythema and tenderness. No streaking or lymphangitis  Skin:    General: Skin is warm and dry.  Neurological:     Mental Status: He is alert.  Psychiatric:        Behavior: Behavior normal.     ED Results / Procedures / Treatments   Labs (all labs ordered are listed, but only abnormal results are displayed) Labs Reviewed  CULTURE, BLOOD (ROUTINE X 2)  CULTURE, BLOOD (ROUTINE X 2)  COMPREHENSIVE METABOLIC PANEL WITH GFR  CBC WITH DIFFERENTIAL/PLATELET  PROTIME-INR  URINALYSIS, W/ REFLEX TO CULTURE (INFECTION SUSPECTED)  BLOOD GAS, VENOUS  I-STAT CG4 LACTIC ACID, ED    EKG None  Radiology No results found.  Procedures Procedures  {Document cardiac monitor, telemetry assessment procedure when appropriate:1}  Medications Ordered in ED Medications  lactated ringers bolus 1,000 mL (has no administration in time range)  HYDROmorphone  (DILAUDID ) injection 0.5 mg (has no administration in time range)  ondansetron  (ZOFRAN ) injection 4 mg (has no administration in time range)  ibuprofen  (ADVIL ) tablet 800 mg (has no administration in time range)    ED Course/ Medical Decision Making/ A&P Clinical Course as of 05/23/24 1409  Sat May 23, 2024  0851 WBC: 5.6 [AH]  0851 Hemoglobin(!): 9.4 [AH]  0927 Sodium(!): 131 [AH]  1343 Patient  case discussed with nurse [facility] liaison for a Thora care she is going to reach out to transition of care team and set up a meeting to come and speak with the patient tomorrow morning [AH]  1409 Urinalysis, w/ Reflex to Culture (Infection Suspected) -Urine, Clean Catch(!) [AH]  1409 Blood gas, venous (at [facility] and [facility])(!) [AH]  1409 Comprehensive metabolic panel(!) [AH]  1409 CBC with Differential(!) [AH]    Clinical Course User Index [AH] Tama Fails, PA-C   {   Click here for ABCD2, HEART and other calculatorsREFRESH Note before signing :1}                              Medical Decision Making Amount  and/or Complexity of Data Reviewed Labs: ordered. Decision-making details documented in ED Course. Radiology: ordered.  Risk OTC drugs. Prescription drug management. Decision regarding hospitalization.   78  {Document critical care time when appropriate:1} {Document review of labs and clinical decision tools ie heart score, Chads2Vasc2 etc:1}  {Document your independent review of radiology images, and any outside records:1} {Document your discussion with family members, caretakers, and with consultants:1} {Document social determinants of health affecting pt's care:1} {Document your decision making why or why not admission, treatments were needed:1} Final Clinical Impression(s) / ED Diagnoses Final diagnoses:  None    Rx / DC Orders ED Discharge Orders     None

## 2024-05-23 NOTE — ED Notes (Signed)
 Called report to nurse on [address], states okay to bring patient up in 20 minutes. JRPRN

## 2024-05-23 NOTE — ED Notes (Signed)
 Patient refused Fleets enema. Patient states he wants to manually disimpact himself.

## 2024-05-23 NOTE — ED Triage Notes (Addendum)
 Pt reports having a fever and pain all over since this morning. Pt is also having congestion. Pt is currently taking doxy x 1 week for cellulitis to his lower legs. Hx of ca, no current therapy plans.

## 2024-05-23 NOTE — ED Notes (Signed)
 Cranberry juice given to patient. JRPRN

## 2024-05-23 NOTE — ED Notes (Signed)
 Patient back in bed from the bathroom and states he feels better, and has no need for enema. States he took care of it himself. JRPRN

## 2024-05-23 NOTE — Plan of Care (Signed)

## 2024-05-23 NOTE — H&P (Signed)
 History and Physical    Patient: Frederick Ponce:[id number] DOB: [birth year] DOA: 05/23/2024 DOS: the patient was seen and examined on 05/23/2024 PCP: Susanna Epley, FNP  Patient coming from: Home  Chief Complaint:  Chief Complaint  Patient presents with   Fever   HPI: Frederick Ponce is a 78 y.o. male with medical history significant of osteoarthritis, depression, family history of melanoma, family history of prostate cancer, GERD, hyperlipidemia, hypertension, Parkinson's disease who presented to the emergency department constipation, lower extremity erythema and subjective fever. He denied chills, rhinorrhea, sore throat, wheezing or hemoptysis. Occasional nausea, no emesis, diarrhea, constipation, melena or hematochezia. No flank pain, dysuria, frequency or hematuria. No polyuria, polydipsia, polyphagia or blurred vision.   Lab work: Urinalysis showed ketones of 5 mg/dL, but was otherwise unremarkable.  CBC showed a white count of 5.6, hemoglobin 9.4 g/dL with an MCV of 956.2 fL and platelets 245.  PT is 14.1 and INR 1.1.  Lactic acid was 1.1 and 0.8 mmol/L.  Venous blood gas showed a normal pH, PCO2 of 36 and a PO2 of 48 mmHg.  Normal bicarbonate and acid-base deficit.  CMP showed a sodium 131 mmol/L, the rest of the electrolytes were normal after calcium  correction.  Normal renal function.  Glucose 121 mg/dL.  Total protein 6.3 and albumin 3.1 g/dL.  AST was 13 units/L.  The rest of the hepatic functions were normal.  Imaging: Portable 1 view chest radiograph showing perihilar airspace disease in the left mid and lower lung zones, worrisome for bronchopneumonia.  CTA chest with no PE infiltrates are worsening metastasis.  There is also interval 2.3 x 1.5 cm left adrenal nodule, suspicious for an adrenal metastasis.  Stable bilateral renal calculi.  Mild dilatation of the right renal collecting system with improvement in interval myelotomy Tatian of the right ureter with tapering of the  normal size proximately 2 cm to the right ureterovesical junction with no obstructing stone or mass visualized.  Stable small pericardial effusion.  Mild hepatic asteatosis.  Aortic atherosclerosis.  Stable large right inguinal hernia containing herniated sigmoid colon without obstruction.   ED course: Initial vital signs were temperature 99.1 F, pulse 99, respiration 16, BP 118/70 mmHg O2 sat 97% on room air.  The patient received hydromorphone  1 mg IVP x 2, ibuprofen  800 mg p.o. x 1, ondansetron  4 mg IVP x 1 and 1000 mL normal saline bolus.  A Fleet enema was ordered, but the patient declined that.  He did manual disimpaction on himself.  Review of Systems: As mentioned in the history of present illness. All other systems reviewed and are negative.  Past Medical History:  Diagnosis Date   Arthritis    Depression    Family history of melanoma    Family history of prostate cancer    GERD (gastroesophageal reflux disease)    Hyperlipidemia    Hypertension    Parkinson disease (HCC)    Prostate cancer (HCC)    History reviewed. No pertinent surgical history. Social History:  reports that he has never smoked. He has never used smokeless tobacco. He reports current alcohol  use of about 15.0 standard drinks of alcohol  per week. He reports that he does not use drugs.  Allergies  Allergen Reactions   Gabapentin  Other (See Comments)    Restless legs/confusion; patient denies symptoms and ask that this be removed.   Celebrex  [Celecoxib ] Rash    Family History  Problem Relation Age of Onset   Kidney disease Mother  Colon cancer Mother    Heart disease Father    Tremor Father    Parkinson's disease Father    Prostate cancer Father    Melanoma Sister    Parkinson's disease Sister    Melanoma Sister        melanoma x2   Prostate cancer Brother    Parkinson's disease Paternal Grandmother    Lung cancer Paternal Grandfather    Parkinson's disease Paternal Aunt    Lung cancer Paternal  Aunt     Prior to Admission medications   Medication Sig Start Date End Date Taking? Authorizing Provider  acetaminophen  (TYLENOL ) 500 MG tablet Take 1,000 mg by mouth every 6 (six) hours as needed for mild pain (pain score 1-3) or moderate pain (pain score 4-6).    [provider]  amLODipine  (NORVASC ) 5 MG tablet Take 1 tablet (5 mg total) by mouth daily. 08/09/23   Susanna Epley, FNP  atorvastatin  (LIPITOR) 40 MG tablet TAKE 1 TABLET BY MOUTH EVERY MORNING 04/23/24   Moore, Janece, FNP  carbidopa -levodopa  (SINEMET ) 25-100 MG tablet Take 1 tablet by mouth 4 (four) times daily. 04/02/24   Wess Hammed, NP  COENZYME Q10 PO Take 1 capsule by mouth every morning.    [provider]  doxycycline  (VIBRA -TABS) 100 MG tablet Take 1 tablet (100 mg total) by mouth 2 (two) times daily for 14 days. 05/12/24 05/26/24  Pickenpack-Cousar, Athena N, NP  fentaNYL  (DURAGESIC ) 75 MCG/HR Place 1 patch onto the skin every other day. 05/05/24   Pickenpack-Cousar, Athena N, NP  guaiFENesin  (MUCINEX ) 600 MG 12 hr tablet Take 2 tablets (1,200 mg total) by mouth 2 (two) times daily as needed for cough or to loosen phlegm. 01/10/24   [name], MD  ketorolac  (TORADOL ) 10 MG tablet Take 1 tablet (10 mg total) by mouth every 8 (eight) hours as needed. 05/12/24   Pickenpack-Cousar, Athena N, NP  KRILL OIL PO Take 1 capsule by mouth every morning.    [provider]  magic mouthwash w/lidocaine  SOLN Take 10 mLs by mouth 4 (four) times daily as needed for mouth pain. Suspension contains equal amounts of Maalox Extra Strength, nystatin, diphenhydramine  and lidocaine . 01/10/24   [name], MD  morphine  (MSIR) 15 MG tablet Take 1 tablet (15 mg total) by mouth every 4 (four) hours as needed for moderate pain (pain score 4-6). 05/05/24   Pickenpack-Cousar, Athena N, NP  multivitamin-lutein (OCUVITE-LUTEIN) CAPS capsule Take 1 capsule by mouth every morning.    [provider]  ondansetron   (ZOFRAN ) 4 MG tablet Take 1 tablet (4 mg total) by mouth every 6 (six) hours as needed for nausea. 01/10/24   [name], MD    Physical Exam: Vitals:   05/23/24 0845 05/23/24 0855 05/23/24 1101 05/23/24 1133  BP:    122/62  Pulse: 87   87  Resp: 18   20  Temp:  99.7 F (37.6 C) 98.2 F (36.8 C)   TempSrc:  Rectal Oral   SpO2: 94%   96%  Weight:      Height:       Physical Exam Vitals reviewed.  Constitutional:      General: He is awake. He is not in acute distress.    Appearance: He is not ill-appearing.  HENT:     Head: Normocephalic.     Nose: No rhinorrhea.     Mouth/Throat:     Mouth: Mucous membranes are moist.  Eyes:     General: No  scleral icterus.    Pupils: Pupils are equal, round, and reactive to light.  Neck:     Vascular: No JVD.  Cardiovascular:     Rate and Rhythm: Normal rate and regular rhythm.     Heart sounds: S1 normal and S2 normal.  Pulmonary:     Effort: Pulmonary effort is normal.     Breath sounds: Normal breath sounds. No wheezing, rhonchi or rales.  Abdominal:     General: Bowel sounds are normal. There is no distension.     Palpations: Abdomen is soft.     Tenderness: There is no abdominal tenderness. There is no guarding.  Musculoskeletal:     Cervical back: Neck supple.     Right lower leg: No edema.     Left lower leg: No edema.  Skin:    General: Skin is warm and dry.     Findings: Erythema present.  Neurological:     General: No focal deficit present.     Mental Status: He is alert and oriented to person, place, and time.  Psychiatric:        Mood and Affect: Mood normal.        Behavior: Behavior normal. Behavior is cooperative.     Data Reviewed:  Results are pending, will review when available.  EKG: Vent. 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 Continue Duragesic  patch. Analgesics as needed. Antiemetics as needed. Hospice evaluation in AM.  Active Problems:   Parkinson's disease (HCC) Continue Sinemet  p.o. 4 times daily.    Essential hypertension Off of antihypertensives. Monitor blood pressure.    Polyneuropathy Continue analgesics.    Atherosclerosis of aorta (HCC) And:   HLD (hyperlipidemia) On atorvastatin  40 mg po daily.    Advance Care Planning:   Code Status: Limited: Do not attempt resuscitation (DNR) -DNR-LIMITED -Do Not Intubate/DNI    Consults:   Family Communication:   Severity of Illness: The appropriate patient status for this patient is OBSERVATION. Observation status is judged to be reasonable and necessary in order to provide the required intensity of service to ensure the patient's safety. The patient's presenting symptoms, physical exam findings, and initial radiographic and laboratory data in the context of their medical condition is felt to place them at decreased risk for further clinical deterioration. Furthermore, it is anticipated that the patient will be medically stable for discharge from the hospital within 2 midnights of admission.   Author: Danice Dural, MD 05/23/2024 1:33 PM  For on call review [email or web address].   This document was prepared using Dragon voice recognition software and may contain some unintended transcription errors.

## 2024-05-23 NOTE — ED Notes (Addendum)
 Called report to [address], nurse states ok to bring up in 20 minutes. JRPRN

## 2024-05-24 DIAGNOSIS — Z515 Encounter for palliative care: Secondary | ICD-10-CM

## 2024-05-24 DIAGNOSIS — Z8042 Family history of malignant neoplasm of prostate: Secondary | ICD-10-CM | POA: Diagnosis not present

## 2024-05-24 DIAGNOSIS — C7802 Secondary malignant neoplasm of left lung: Secondary | ICD-10-CM | POA: Diagnosis present

## 2024-05-24 DIAGNOSIS — C7801 Secondary malignant neoplasm of right lung: Secondary | ICD-10-CM | POA: Diagnosis present

## 2024-05-24 DIAGNOSIS — R053 Chronic cough: Secondary | ICD-10-CM | POA: Diagnosis present

## 2024-05-24 DIAGNOSIS — C61 Malignant neoplasm of prostate: Secondary | ICD-10-CM

## 2024-05-24 DIAGNOSIS — Z66 Do not resuscitate: Secondary | ICD-10-CM

## 2024-05-24 DIAGNOSIS — R0902 Hypoxemia: Secondary | ICD-10-CM | POA: Diagnosis present

## 2024-05-24 DIAGNOSIS — C7951 Secondary malignant neoplasm of bone: Secondary | ICD-10-CM | POA: Diagnosis present

## 2024-05-24 DIAGNOSIS — K219 Gastro-esophageal reflux disease without esophagitis: Secondary | ICD-10-CM | POA: Diagnosis present

## 2024-05-24 DIAGNOSIS — Z7189 Other specified counseling: Secondary | ICD-10-CM | POA: Diagnosis not present

## 2024-05-24 DIAGNOSIS — E785 Hyperlipidemia, unspecified: Secondary | ICD-10-CM | POA: Diagnosis present

## 2024-05-24 DIAGNOSIS — R52 Pain, unspecified: Secondary | ICD-10-CM | POA: Diagnosis present

## 2024-05-24 DIAGNOSIS — R627 Adult failure to thrive: Secondary | ICD-10-CM | POA: Diagnosis present

## 2024-05-24 DIAGNOSIS — Z79899 Other long term (current) drug therapy: Secondary | ICD-10-CM | POA: Diagnosis not present

## 2024-05-24 DIAGNOSIS — G893 Neoplasm related pain (acute) (chronic): Secondary | ICD-10-CM | POA: Diagnosis present

## 2024-05-24 DIAGNOSIS — Z923 Personal history of irradiation: Secondary | ICD-10-CM | POA: Diagnosis not present

## 2024-05-24 DIAGNOSIS — G2581 Restless legs syndrome: Secondary | ICD-10-CM | POA: Diagnosis present

## 2024-05-24 DIAGNOSIS — G20A1 Parkinson's disease without dyskinesia, without mention of fluctuations: Secondary | ICD-10-CM | POA: Diagnosis present

## 2024-05-24 DIAGNOSIS — R509 Fever, unspecified: Secondary | ICD-10-CM | POA: Diagnosis present

## 2024-05-24 DIAGNOSIS — Z9221 Personal history of antineoplastic chemotherapy: Secondary | ICD-10-CM | POA: Diagnosis not present

## 2024-05-24 DIAGNOSIS — K5903 Drug induced constipation: Secondary | ICD-10-CM | POA: Diagnosis present

## 2024-05-24 DIAGNOSIS — Z8249 Family history of ischemic heart disease and other diseases of the circulatory system: Secondary | ICD-10-CM | POA: Diagnosis not present

## 2024-05-24 DIAGNOSIS — I10 Essential (primary) hypertension: Secondary | ICD-10-CM | POA: Diagnosis present

## 2024-05-24 DIAGNOSIS — I700 Atherosclerosis of aorta: Secondary | ICD-10-CM | POA: Diagnosis present

## 2024-05-24 DIAGNOSIS — M7989 Other specified soft tissue disorders: Secondary | ICD-10-CM | POA: Diagnosis present

## 2024-05-24 DIAGNOSIS — D630 Anemia in neoplastic disease: Secondary | ICD-10-CM | POA: Diagnosis present

## 2024-05-24 MED ORDER — BISACODYL 5 MG PO TBEC: 5.0000 mg | DELAYED_RELEASE_TABLET | Freq: Every day | ORAL | Status: DC | PRN

## 2024-05-24 MED ORDER — POLYETHYLENE GLYCOL 3350 17 G PO PACK
17.0000 g | PACK | Freq: Two times a day (BID) | ORAL | Status: DC
  Administered 2024-05-24 – 2024-05-26 (×3): 17 g via ORAL
  Filled 2024-05-24 (×3): qty 1

## 2024-05-24 MED ORDER — MORPHINE SULFATE 15 MG PO TABS
15.0000 mg | ORAL_TABLET | ORAL | Status: DC | PRN
  Administered 2024-05-24 – 2024-05-25 (×3): 15 mg via ORAL
  Administered 2024-05-25: 30 mg via ORAL
  Administered 2024-05-25: 15 mg via ORAL
  Filled 2024-05-24 (×2): qty 1
  Filled 2024-05-24: qty 2
  Filled 2024-05-24 (×2): qty 1

## 2024-05-24 MED ORDER — KETOROLAC TROMETHAMINE 15 MG/ML IJ SOLN: 15.0000 mg | Freq: Four times a day (QID) | INTRAMUSCULAR | Status: DC | PRN

## 2024-05-24 MED ORDER — FENTANYL 100 MCG/HR TD PT72
1.0000 | MEDICATED_PATCH | TRANSDERMAL | Status: DC
  Administered 2024-05-24: 1 via TRANSDERMAL
  Filled 2024-05-24: qty 1

## 2024-05-24 MED ORDER — AMLODIPINE BESYLATE 5 MG PO TABS
5.0000 mg | ORAL_TABLET | Freq: Every day | ORAL | Status: DC
  Administered 2024-05-24 – 2024-05-26 (×3): 5 mg via ORAL
  Filled 2024-05-24 (×3): qty 1

## 2024-05-24 MED ORDER — SENNOSIDES-DOCUSATE SODIUM 8.6-50 MG PO TABS
1.0000 | ORAL_TABLET | Freq: Two times a day (BID) | ORAL | Status: DC
  Administered 2024-05-24 – 2024-05-26 (×5): 1 via ORAL
  Filled 2024-05-24 (×5): qty 1

## 2024-05-24 MED ORDER — MORPHINE SULFATE 15 MG PO TABS
15.0000 mg | ORAL_TABLET | ORAL | Status: DC | PRN
  Administered 2024-05-24: 15 mg via ORAL
  Filled 2024-05-24: qty 1

## 2024-05-24 MED ORDER — POLYETHYLENE GLYCOL 3350 17 G PO PACK
17.0000 g | PACK | Freq: Every day | ORAL | Status: DC
  Administered 2024-05-24: 17 g via ORAL
  Filled 2024-05-24: qty 1

## 2024-05-24 MED ORDER — ACETAMINOPHEN 500 MG PO TABS
1000.0000 mg | ORAL_TABLET | Freq: Four times a day (QID) | ORAL | Status: DC | PRN
  Administered 2024-05-24 – 2024-05-25 (×3): 1000 mg via ORAL
  Filled 2024-05-24 (×3): qty 2

## 2024-05-24 NOTE — TOC Initial Note (Signed)
 Transition of Care [facility]) - Initial/Assessment Note    Patient Details  Name: Frederick Ponce MRN: [id number] Date of Birth: 01-19-1946  Transition of Care [facility]) CM/SW Contact:    Jonni Nettle, LCSW Phone Number: 05/24/2024, 11:27 AM  Clinical Narrative:                 CSW met with pt at bedside to discuss MD recommendation for hospice services. Pt reports readiness to begin hospice services and wanting to improve the quality of his life versus quantity. CSW inquired about hospice agency preference. Pt reports wanting to begin services with [facility]. CSW sent referral to hospital liaison, Madelene Schanz. TOC will continue to follow.    Expected Discharge Plan: Home w Hospice Care Barriers to Discharge: Continued Medical Work up   Patient Goals and CMS Choice Patient states their goals for this hospitalization and ongoing recovery are:: To go home with hospice   Expected Discharge Plan and Services In-house Referral: Clinical Social Work Discharge Planning Services: Other - See comment (hospice) Post Acute Care Choice: Hospice Living arrangements for the past 2 months: Single Family Home                 DME Arranged: N/A DME Agency: NA       HH Arranged: NA HH Agency: NA        Prior Living Arrangements/Services Living arrangements for the past 2 months: Single Family Home Lives with:: Self, Spouse Patient language and need for interpreter reviewed:: Yes Do you feel safe going back to the place where you live?: Yes      Need for Family Participation in Patient Care: Yes (Comment) Care giver support system in place?: Yes (comment) Current home services: Other (comment) (N/A) Criminal Activity/Legal Involvement Pertinent to Current Situation/Hospitalization: No - Comment as needed  Activities of Daily Living   ADL Screening (condition at time of admission) Independently performs ADLs?: Yes (appropriate for developmental age) Is the patient deaf or have  difficulty hearing?: No Does the patient have difficulty seeing, even when wearing glasses/contacts?: No Does the patient have difficulty concentrating, remembering, or making decisions?: No  Permission Sought/Granted Permission sought to share information with : Family Supports Permission granted to share information with : Yes, Verbal Permission Granted     Permission granted to share info w AGENCY: [facility]  Emotional Assessment Appearance:: Appears stated age Attitude/Demeanor/Rapport: Engaged Affect (typically observed): Accepting, Adaptable, Appropriate, Pleasant Orientation: : Oriented to Self, Oriented to Place, Oriented to  Time, Oriented to Situation Alcohol  / Substance Use: Not Applicable Psych Involvement: No (comment)  Admission diagnosis:  Intractable pain [R52] Prostate cancer metastatic to multiple sites [facility]) [C61] Patient Active Problem List   Diagnosis Date Noted   Intractable pain 05/23/2024   Atherosclerosis of aorta (HCC) 02/10/2024   Secondary malignant neoplasm of right lung (HCC) 02/10/2024   Secondary malignant neoplasm of left lung (HCC) 02/10/2024   COVID-19 vaccination declined 02/10/2024   Encounter for hepatitis C screening test for low risk patient 02/10/2024   Bilateral lower extremity edema 02/10/2024   Malnutrition of moderate degree 01/09/2024   Failure to thrive in adult 01/07/2024   Chronic pain 01/07/2024   History of basal cell carcinoma 01/06/2024   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   Essential hypertension 08/09/2023   Overweight with body mass index (BMI) of 26 to 26.9 in adult 08/09/2023   Polyneuropathy 08/09/2023   Basal cell carcinoma, forehead 02/20/2023   Genetic testing 12/21/2022   Family history of  prostate cancer 12/05/2022   Family history of melanoma 12/05/2022   COVID-19 08/29/2022   Acute encephalopathy 07/22/2022   Acute prerenal azotemia 07/22/2022   GAD (generalized anxiety disorder) 07/22/2022    Overdose 07/22/2022   Basal cell carcinoma (BCC) of left shoulder 06/07/2022   Overweight 01/24/2022   HLD (hyperlipidemia) 01/24/2022   Parkinson's disease (HCC) 01/24/2022   Prostate 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 (HCC) 01/18/2022   Iliac bone pain 12/27/2021   Dupuytren's disease 07/01/2017   PCP:  Susanna Epley, FNP Pharmacy:   [facility] [id number] [place], [place] - [address] [place] [address] Phone: [phone] Fax: [phone]  [place] - [facility] [address] [place] [address] Phone: [phone] Fax: [phone]  [facility] - [place], [place] - [address] [place] [address] Phone: [phone] Fax: [phone]     Social Drivers of Health (SDOH) Social History: SDOH Screenings   Food Insecurity: No Food Insecurity (05/23/2024)  Housing: Low Risk  (05/23/2024)  Transportation Needs: No Transportation Needs (05/23/2024)  Utilities: Not At Risk (05/23/2024)  Alcohol  Screen: Low Risk  (02/09/2024)  Depression (PHQ2-9): Low Risk  (02/10/2024)  Financial Resource Strain: Low Risk  (02/09/2024)  Physical Activity: Unknown (02/09/2024)  Social Connections: Moderately Isolated (05/23/2024)  Stress: Stress Concern Present (02/09/2024)  Tobacco Use: Low Risk  (05/23/2024)  Recent Concern: Tobacco Use - Medium Risk (04/20/2024)   Received from [facility]   SDOH Interventions: None indicated     Readmission Risk Interventions     No data to display

## 2024-05-24 NOTE — Progress Notes (Signed)
 PROGRESS NOTE   Frederick Ponce  ZOX:[id number]    DOB: 05-10-1946    DOA: 05/23/2024  PCP: Susanna Epley, FNP   I have briefly reviewed patients previous medical records in [facility].   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  Admitted due to intractable cancer related pain.  Palliative care consulted for symptom management.  Hospice team evaluating for home hospice.   Assessment & Plan:   Intractable cancer-related pain Is followed by palliative care team at the cancer center Home regimen includes: Fentanyl  patch 75 mcg/h every other day, Toradol  10 mg at bedtime as needed, MS IR 15 mg every 4 hours as needed. Above pain regimen not controlling pain. Palliative care medicine consulted for pain management As discussed in detail with patient, he strongly wishes to proceed with Hospice, they were consulted Pending palliative care consultation, continue fentanyl  75 mcg/h every other day, Tylenol  1 g Q4 hourly as needed for mild pain, MS IR 15 mg every 4 hourly as needed for moderate pain, Toradol  15 mg Q8 hourly as needed for moderate pain not controlled with oral meds, Dilaudid  1 mg every 2 hours as needed for severe pain.  Aggressive bowel regimen initiated. May need to consider starting MS Contin  or OxyContin .  Opioid related constipation Patient reports BM on 5/31 MiraLAX  17 g twice daily, Senokot S1 tab twice daily,  Dulcolax p.o. as needed  Essential hypertension Controlled.  Continue prior home dose of amlodipine  5 Mg daily.  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  Parkinson's disease Continue home dose of Sinemet   Chronic bilateral leg erythema ?  Etiology.?  Related to radiation. Despite treatment with almost a week of doxycycline , has not seen any improvement. Reports that this has been present for almost 6 to 8 weeks Does not feel infectious in nature.  Possibly inflammatory.  For now continue oral doxycycline  but consider stopping.  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  No PE.   As per oncology has exhausted all treatment options and patient does not wish to pursue chemotherapy Hospice consulted Follows with Dr. Rosaline Coma, included him and the care team.  Body mass index is 25.88 kg/m.   DVT prophylaxis: enoxaparin (LOVENOX) injection 40 mg Start: 05/23/24 2200     Code Status: Limited: Do not attempt resuscitation (DNR) -DNR-LIMITED -Do Not Intubate/DNI :  Family Communication:  Disposition:  Status is: Observation The patient will require care spanning > 2 midnights and should be moved to inpatient because: Intractable cancer related pain requiring polypharmacy including IV meds, awaiting palliative care consultation for further titration.     Consultants:   Palliative care medicine Hospice team  Procedures:     Subjective:  History as noted above.  Pain is better controlled.  Asking to see hospice as soon as possible.  States that palliative care did not work for him and had been on it for  a couple years.  BM yesterday.  No other complaints.  Reports good appetite and only 2 to 3 pound weight loss.  Objective:   Vitals:   05/23/24 1509 05/23/24 1940 05/24/24 0056  05/24/24 0300  BP: 122/64 (!) 124/56 (!) 124/57 127/66  Pulse: 73 84 86 87  Resp: 15 20  18   Temp: 97.7 F (36.5 C) 97.9 F (36.6 C) 98 F (36.7 C) 98.2 F (36.8 C)  TempSrc: Oral Oral Oral Oral  SpO2: 92% (!) 88% (!) 88% 92%  Weight: 77.2 kg     Height: 5\' 8"  (1.727 m)       General exam: Elderly male, moderately built and nourished lying comfortably supine in bed without distress.  Oral mucosa moist. Respiratory system: Clear to auscultation. Respiratory effort normal. Cardiovascular system: S1 & S2 heard, RRR. No JVD, murmurs, rubs, gallops or clicks. No pedal edema. Gastrointestinal system: Abdomen is nondistended, soft and nontender. No organomegaly or masses felt. Normal bowel sounds heard. Central nervous system: Alert and oriented. No focal neurological deficits. Extremities: Symmetric 5 x 5 power.  Bilateral legs with erythema up to mid legs, slightly warm, nontender and no other acute findings. Skin: No rashes, lesions or ulcers Psychiatry: Judgement and insight appear normal. Mood & affect appropriate.     Data Reviewed:   I have personally reviewed following labs and imaging studies   CBC: Recent Labs  Lab 05/23/24 0805  WBC 5.6  NEUTROABS 3.8  HGB 9.4*  HCT 30.0*  MCV 102.4*  PLT 245    Basic Metabolic Panel: Recent Labs  Lab 05/23/24 0805  NA 131*  K 4.0  CL 100  CO2 23  GLUCOSE 121*  BUN 14  CREATININE 0.73  CALCIUM  8.1*    Liver Function Tests: Recent Labs  Lab 05/23/24 0805  AST 13*  ALT <5  ALKPHOS 61  BILITOT 1.0  PROT 6.3*  ALBUMIN 3.1*    CBG: No results for input(s): "GLUCAP" in the last 168 hours.  Microbiology Studies:   Recent Results (from the past 240 hours)  Blood Culture (routine x 2)     Status: None (Preliminary result)   Collection Time: 05/23/24  8:05 AM   Specimen: BLOOD RIGHT ARM  Result Value Ref Range Status   Specimen Description   Final    BLOOD RIGHT ARM Performed at [facility], [address] [place], [place] [address]    Special Requests   Final    BOTTLES DRAWN AEROBIC AND ANAEROBIC Blood Culture results may not be optimal due to an inadequate volume of blood received in culture bottles Performed at [facility], [address] [place], [place] [address]    Culture   Final    NO GROWTH < 24 HOURS Performed at [facility], [address], [place], [place] [address]    Report Status PENDING  Incomplete  Blood Culture (routine x 2)     Status: None (Preliminary result)   Collection Time: 05/23/24  8:08 AM   Specimen: BLOOD LEFT ARM  Result Value Ref Range Status   Specimen Description   Final    BLOOD LEFT ARM Performed at [facility], [address] [place], [place] [address]    Special Requests   Final    BOTTLES DRAWN AEROBIC AND ANAEROBIC Blood Culture results may not be optimal due to an inadequate volume of blood received in culture bottles Performed at [facility], [address] [place], [place] [address]    Culture   Final    NO GROWTH < 24 HOURS Performed at [facility], [address] [place], [place] [address]    Report Status PENDING  Incomplete    Radiology Studies:  CT Angio Chest PE W and/or Wo Contrast Result Date: 05/23/2024 CLINICAL DATA:  Acute, non localized abdominal pain and fever since this morning. Chest congestion. Bilateral lower leg cellulitis. Prostate cancer treated with radiation therapy. Previously demonstrated left lung and bone metastases. EXAM: CT ANGIOGRAPHY CHEST CT ABDOMEN AND PELVIS WITH CONTRAST TECHNIQUE: Multidetector CT imaging of the chest was performed using the standard protocol during bolus administration of intravenous contrast. Multiplanar CT image reconstructions and MIPs were obtained to evaluate the vascular anatomy. Multidetector CT imaging of the abdomen and pelvis was performed using the standard protocol during bolus administration of intravenous  contrast. RADIATION DOSE REDUCTION: This exam was performed according to the departmental dose-optimization program which includes automated exposure control, adjustment of the mA and/or kV according to patient size and/or use of iterative reconstruction technique. CONTRAST:  OMNIPAQUE  IOHEXOL  350 MG/ML SOLN COMPARISON:  Portable chest radiograph obtained earlier today. Abdomen and pelvis CT dated 04/01/2024. Chest CTA dated 01/06/2024. PET-CT dated 11/04/2023 FINDINGS: CTA CHEST FINDINGS Cardiovascular: Stable mildly enlarged heart. Small pericardial effusion without significant change. Normally opacified pulmonary arteries with no pulmonary arterial filling defects seen. Mediastinum/Nodes: Interval decrease in size of some of the previously demonstrated enlarged mediastinal nodes with an interval mild increase in size of some of the previously demonstrated normal sized mediastinal nodes. A previous 18 mm short axis AP window lymph node currently has a short axis diameter of 15 mm on image number 50/5. A previously demonstrated 4 mm short axis prevascular lymph node on the right currently has a short axis diameter of 6 mm on image number 47/5. A 2nd previously demonstrated 4 mm short axis prevascular node has a short axis diameter of 9 mm on image number 54/5. Mildly enlarged bilateral supraclavicular lymph nodes with an interval increase in size. The largest is on the left with a short axis diameter of 12 mm on image number 14/5, previously 9 mm. No enlarged axillary lymph nodes. Interval enlarged proximal left hilar node with a short axis diameter of 16 mm on image number 71/5, previously 8 mm. Interval increase in size of a normal-sized right hilar node with a short axis diameter of 5 mm on image number 64, previously 2 mm. Unremarkable thyroid gland and esophagus. Small amount of retained secretions in the trachea. Lungs/Pleura: Small to moderate-sized left pleural effusion, increased. A left lower lobe  infrahilar mass currently measures 3.0 x 2.8 cm on image number 75/5, previously 3.1 x 2.6 cm on 01/06/2024. This had some increased tracer activity on the previous PET-CT. More peripheral left lower lobe mass currently measures 5.0 x 3.7 cm on image number 92/5, previously 4.6 x 3.7 cm on 01/06/2024. This has markedly increased tracer activity on the previous PET-CT. The 1.3 x 0.7 cm spiculated nodule in the left upper lobe seen on 01/06/2024 currently measures 2.4 x 2.0 cm on image number 58/13. There is a large number of additional new, smaller nodules in the left upper lobe. Progressive diffuse interstitial nodular opacities throughout the left lower lobe with interval similar opacities in the posterior left upper lobe. Clear right lung.  No right pleural fluid. 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 Stable sclerotic lesions  in the T7 vertebral body, left T7 transverse process and left 7th posterior rib. Stable thoracic spine degenerative changes and ankylosis. Review of the MIP images confirms the above findings. CT ABDOMEN and PELVIS FINDINGS Hepatobiliary: Mild diffuse low-density of the liver. Stable right lobe liver cyst and hemangioma. Normal-appearing gallbladder. Pancreas: Moderate diffuse pancreatic atrophy. Spleen: Normal in size without focal abnormality. Adrenals/Urinary Tract: Interval medium density left adrenal nodule measuring 2.3 x 1.5 cm, suspicious for an adrenal metastasis. Stable bilateral renal calculi. Mild dilatation of the right renal collecting system with improvement and interval mild dilatation of the right ureter with tapering 2 normal-size proximally 2 cm proximal to the right ureterovesical junction with no obstructing stone or mass visualized. Stable right renal parapelvic cyst. This does not need imaging follow-up. There is some excreted contrast on the initial images in the left renal collecting system and proximal ureter  due to the chest being imaged 1st. Unremarkable urinary bladder. Stomach/Bowel: Loop of sigmoid colon in a large right inguinal hernia extruding into the upper scrotum on the right without wall thickening or dilatation. Unremarkable stomach, small bowel and appendix. Vascular/Lymphatic: Atheromatous aortic calcifications without aneurysm. Interval mildly enlarged retroperitoneal lymph nodes. The largest is a left para-aortic node with a short axis diameter of 12 mm on image number 41/2, previously 2 mm. Reproductive: Small prostate gland. Other: Stable large right inguinal hernia containing herniated sigmoid colon without obstruction. Small left inguinal hernia containing fat. Musculoskeletal: Extensive sclerotic metastatic disease involving the bony pelvis without significant change. Sclerotic areas in the L5 and L2 vertebral bodies without significant change. Review of the MIP images confirms the above findings. IMPRESSION: 1. No pulmonary embolus. 2. Progressive left lung metastatic disease with a large number of new, smaller nodules in the left upper lobe, enlargement of the previously demonstrated spiculated nodule in the left upper lobe, and progressive diffuse interstitial nodular opacities throughout the left lower lobe and posterior left upper lobe. 3. No significant change to slight increase in size of 2 dominant metastases in the left lower lobe. 4. Progressive mediastinal, left hilar and bilateral supraclavicular adenopathy with interval mild retroperitoneal adenopathy compatible with metastatic adenopathy. 5. Interval 2.3 x 1.5 cm left adrenal nodule, suspicious for an adrenal metastasis. 6. Stable sclerotic metastatic disease in the thoracic spine, left 7th posterior rib, L5 and L2 vertebral bodies and bony pelvis. 7. Stable bilateral renal calculi. 8. Mild dilatation of the right renal collecting system with improvement and interval mild dilatation of the right ureter with tapering to normal-size  proximally 2 cm proximal to the right ureterovesical junction with no obstructing stone or mass visualized. 9. Stable large right inguinal hernia containing herniated sigmoid colon without obstruction. 10. Stable small pericardial effusion. 11. Mild hepatic steatosis. 12. Aortic atherosclerosis. Aortic Atherosclerosis (ICD10-I70.0). Electronically Signed   By: Catherin Closs M.D.   On: 05/23/2024 12:09   CT ABDOMEN PELVIS W CONTRAST Result Date: 05/23/2024 CLINICAL DATA:  Acute, non localized abdominal pain and fever since this morning. Chest congestion. Bilateral lower leg cellulitis. Prostate cancer treated with radiation therapy. Previously demonstrated left lung and bone metastases. EXAM: CT ANGIOGRAPHY CHEST CT ABDOMEN AND PELVIS WITH CONTRAST TECHNIQUE: Multidetector CT imaging of the chest was performed using the standard protocol during bolus administration of intravenous contrast. Multiplanar CT image reconstructions and MIPs were obtained to evaluate the vascular anatomy. Multidetector CT imaging of the abdomen and pelvis was performed using the standard protocol during bolus administration of intravenous contrast. RADIATION DOSE REDUCTION: This exam  was performed according to the departmental dose-optimization program which includes automated exposure control, adjustment of the mA and/or kV according to patient size and/or use of iterative reconstruction technique. CONTRAST:  OMNIPAQUE  IOHEXOL  350 MG/ML SOLN COMPARISON:  Portable chest radiograph obtained earlier today. Abdomen and pelvis CT dated 04/01/2024. Chest CTA dated 01/06/2024. PET-CT dated 11/04/2023 FINDINGS: CTA CHEST FINDINGS Cardiovascular: Stable mildly enlarged heart. Small pericardial effusion without significant change. Normally opacified pulmonary arteries with no pulmonary arterial filling defects seen. Mediastinum/Nodes: Interval decrease in size of some of the previously demonstrated enlarged mediastinal nodes with an interval  mild increase in size of some of the previously demonstrated normal sized mediastinal nodes. A previous 18 mm short axis AP window lymph node currently has a short axis diameter of 15 mm on image number 50/5. A previously demonstrated 4 mm short axis prevascular lymph node on the right currently has a short axis diameter of 6 mm on image number 47/5. A 2nd previously demonstrated 4 mm short axis prevascular node has a short axis diameter of 9 mm on image number 54/5. Mildly enlarged bilateral supraclavicular lymph nodes with an interval increase in size. The largest is on the left with a short axis diameter of 12 mm on image number 14/5, previously 9 mm. No enlarged axillary lymph nodes. Interval enlarged proximal left hilar node with a short axis diameter of 16 mm on image number 71/5, previously 8 mm. Interval increase in size of a normal-sized right hilar node with a short axis diameter of 5 mm on image number 64, previously 2 mm. Unremarkable thyroid gland and esophagus. Small amount of retained secretions in the trachea. Lungs/Pleura: Small to moderate-sized left pleural effusion, increased. A left lower lobe infrahilar mass currently measures 3.0 x 2.8 cm on image number 75/5, previously 3.1 x 2.6 cm on 01/06/2024. This had some increased tracer activity on the previous PET-CT. More peripheral left lower lobe mass currently measures 5.0 x 3.7 cm on image number 92/5, previously 4.6 x 3.7 cm on 01/06/2024. This has markedly increased tracer activity on the previous PET-CT. The 1.3 x 0.7 cm spiculated nodule in the left upper lobe seen on 01/06/2024 currently measures 2.4 x 2.0 cm on image number 58/13. There is a large number of additional new, smaller nodules in the left upper lobe. Progressive diffuse interstitial nodular opacities throughout the left lower lobe with interval similar opacities in the posterior left upper lobe. Clear right lung.  No right pleural fluid. Musculoskeletal: Stable right humeral  head and neck calcified avascular necrosis or enchondroma. Stable sclerotic lesions in the T7 vertebral body, left T7 transverse process and left 7th posterior rib. Stable thoracic spine degenerative changes and ankylosis. Review of the MIP images confirms the above findings. CT ABDOMEN and PELVIS FINDINGS Hepatobiliary: Mild diffuse low-density of the liver. Stable right lobe liver cyst and hemangioma. Normal-appearing gallbladder. Pancreas: Moderate diffuse pancreatic atrophy. Spleen: Normal in size without focal abnormality. Adrenals/Urinary Tract: Interval medium density left adrenal nodule measuring 2.3 x 1.5 cm, suspicious for an adrenal metastasis. Stable bilateral renal calculi. Mild dilatation of the right renal collecting system with improvement and interval mild dilatation of the right ureter with tapering 2 normal-size proximally 2 cm proximal to the right ureterovesical junction with no obstructing stone or mass visualized. Stable right renal parapelvic cyst. This does not need imaging follow-up. There is some excreted contrast on the initial images in the left renal collecting system and proximal ureter due to the chest being imaged  1st. Unremarkable urinary bladder. Stomach/Bowel: Loop of sigmoid colon in a large right inguinal hernia extruding into the upper scrotum on the right without wall thickening or dilatation. Unremarkable stomach, small bowel and appendix. Vascular/Lymphatic: Atheromatous aortic calcifications without aneurysm. Interval mildly enlarged retroperitoneal lymph nodes. The largest is a left para-aortic node with a short axis diameter of 12 mm on image number 41/2, previously 2 mm. Reproductive: Small prostate gland. Other: Stable large right inguinal hernia containing herniated sigmoid colon without obstruction. Small left inguinal hernia containing fat. Musculoskeletal: Extensive sclerotic metastatic disease involving the bony pelvis without significant change. Sclerotic areas in  the L5 and L2 vertebral bodies without significant change. Review of the MIP images confirms the above findings. IMPRESSION: 1. No pulmonary embolus. 2. Progressive left lung metastatic disease with a large number of new, smaller nodules in the left upper lobe, enlargement of the previously demonstrated spiculated nodule in the left upper lobe, and progressive diffuse interstitial nodular opacities throughout the left lower lobe and posterior left upper lobe. 3. No significant change to slight increase in size of 2 dominant metastases in the left lower lobe. 4. Progressive mediastinal, left hilar and bilateral supraclavicular adenopathy with interval mild retroperitoneal adenopathy compatible with metastatic adenopathy. 5. Interval 2.3 x 1.5 cm left adrenal nodule, suspicious for an adrenal metastasis. 6. Stable sclerotic metastatic disease in the thoracic spine, left 7th posterior rib, L5 and L2 vertebral bodies and bony pelvis. 7. Stable bilateral renal calculi. 8. Mild dilatation of the right renal collecting system with improvement and interval mild dilatation of the right ureter with tapering to normal-size proximally 2 cm proximal to the right ureterovesical junction with no obstructing stone or mass visualized. 9. Stable large right inguinal hernia containing herniated sigmoid colon without obstruction. 10. Stable small pericardial effusion. 11. Mild hepatic steatosis. 12. Aortic atherosclerosis. Aortic Atherosclerosis (ICD10-I70.0). Electronically Signed   By: Catherin Closs M.D.   On: 05/23/2024 12:09   DG Chest Port 1 View Result Date: 05/23/2024 CLINICAL DATA:  Questionable sepsis - evaluate for abnormality, fever EXAM: PORTABLE CHEST - 1 VIEW COMPARISON:  January 06, 2024 FINDINGS: Perihilar airspace disease in the left mid and lower lung zones. No pneumothorax or pleural effusion. Unchanged cardiomegaly. No acute fracture or destructive lesion. IMPRESSION: Perihilar airspace disease in the left mid and  lower lung zones, worrisome for bronchopneumonia. Electronically Signed   By: Rance Burrows M.D.   On: 05/23/2024 08:46    Scheduled Meds:    atorvastatin   40 mg Oral q morning   carbidopa -levodopa   1 tablet Oral QID   doxycycline   100 mg Oral BID   enoxaparin (LOVENOX) injection  40 mg Subcutaneous Q24H   feeding supplement  237 mL Oral BID BM   [START ON 05/25/2024] fentaNYL   1 patch Transdermal Q48H   sodium phosphate   1 enema Rectal Once    Continuous Infusions:     LOS: 0 days     Aubrey Blas, MD,  FACP, [facility], [facility], [facility]   [facility] [place]      To contact the attending provider between 7A-7P or the covering provider during after hours 7P-7A, please log into the web site [email or web address] and access using universal [place] password for that web site. If you do not have the password, please call the hospital operator.  05/24/2024, 10:46 AM

## 2024-05-24 NOTE — Consult Note (Signed)
 Palliative Care Consult Note                                  Date: 05/24/2024   Patient Name: Frederick Ponce  DOB: 04-30-1946  MRN: [id number]  Age / Sex: 78 y.o., male  PCP: Susanna Epley, FNP Referring Physician: Casey Clay, MD  Reason for Consultation: Establishing goals of care, Non pain symptom management, and Pain control  HPI/Patient Profile: Palliative Care consult requested for goals of care discussion in this 78 y.o. 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 He was admitted on 05/23/2024 from home with increased pain uncontrolled on home regimen.  Past Medical History:  Diagnosis Date   Arthritis    Depression    Family history of melanoma    Family history of prostate cancer    GERD (gastroesophageal reflux disease)    Hyperlipidemia    Hypertension    Parkinson disease (HCC)    Prostate cancer (HCC)      Subjective:   This NP Gardenia Jump reviewed medical records, received report from team, assessed the patient and then met at the patient's bedside with [name] to discuss diagnosis, prognosis, GOC, EOL wishes disposition and options. Mr. Arwood is sitting up in the recliner. Alert and able to engage appropriately in discussions.    Patient is familiar to myself as I have been closely following him at the [facility] for his ongoing pain and symptoms. Values and goals of care important to patient and family were attempted to be elicited.  I created space and opportunity for patient to explore state of health prior to admission, thoughts, and feelings.   [name] shares his pain worsened over several days with no relief despite home regimen. He states the pain became so severe that he had no choice but to seek emergency care leading to his hospitalization.   Patient was recently seen by Dr. Rosaline Coma  (Oncologist). Recent PSA elevated at 60.3 from 5.7 on April 4th. He has declined additional chemotherapy and is realistic in his understanding that he has exhausted all treatment options.   Mr. Vivero has met with [facility] and aware of the differences in hospice and palliative. We discussed again at length what care outpatient would look like for him and he verbalizes understanding. At this time he would like to discharge home under hospice care allowing assistance with home needs for what time he has left including medication adjustments.   Adolm Ahumada, RN with [facility] at the bedside. Will plan to arrange home admission at discharge with any required DME needs.   I discussed Mr. Slaymaker pain regimen at length. He reports ongoing pain which is somewhat improved with use of as needed Dilaudid  IV. His home regimen included Fentanyl  75mcg patch and MS IR 15mg  every 4 hours as needed. We will increase patch to 100mcg and MS IR to 15-30mg  every 4 hours as needed. Toradol  15mg  every 6 hours as needed. Continue with IV dilaudid  for severe pain. Patient verbalized understanding and agreement.   [name] shares ongoing constipation. He was able to manually disimpact himself on yesterday. Education provided on bowel regimen in the setting of opioid use.   Questions and concerns were addressed. The patient and family was encouraged to call with questions or concerns.  PMT will continue to support holistically as needed.  Objective:   Primary Diagnoses: Present on Admission:  Intractable pain  Parkinson's disease (HCC)  HLD (hyperlipidemia)  Atherosclerosis of aorta (HCC)  Essential hypertension  Failure to thrive in adult  Prostate 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 (HCC)   Scheduled Meds:  amLODipine   5 mg Oral Daily   atorvastatin   40 mg Oral q morning   carbidopa -levodopa   1 tablet Oral QID   doxycycline   100 mg Oral BID   enoxaparin (LOVENOX) injection  40 mg Subcutaneous Q24H   feeding supplement  237 mL  Oral BID BM   fentaNYL   1 patch Transdermal Q72H   polyethylene glycol  17 g Oral BID   senna-docusate  1 tablet Oral BID    Continuous Infusions:   PRN Meds: acetaminophen , bisacodyl, guaiFENesin , HYDROmorphone  (DILAUDID ) injection, ketorolac , morphine , ondansetron  **OR** ondansetron  (ZOFRAN ) IV, mouth rinse  Allergies  Allergen Reactions   Gabapentin  Other (See Comments)    Restless legs/confusion; patient denies symptoms and ask that this be removed.   Celebrex  [Celecoxib ] Rash    Review of Systems  Constitutional:  Positive for activity change and fatigue.  Cardiovascular:  Positive for leg swelling.  Musculoskeletal:  Positive for arthralgias.  Neurological:  Positive for tremors.  Unless otherwise noted, a complete review of systems is negative.  Physical Exam General: NAD, chronically-ill appearing Cardiovascular: regular rate and rhythm Pulmonary: Normal breathing pattern  Abdomen: soft, nontender, + bowel sounds Extremities: bilateral lower extremity edema, redness, no joint deformities Skin: no rashes, warm and dry Neurological: AAO x3  Vital Signs:  BP 127/66 (BP Location: Left Arm)   Pulse 87   Temp 98.2 F (36.8 C) (Oral)   Resp 18   Ht 5\' 8"  (1.727 m)   Wt 77.2 kg   SpO2 92%   BMI 25.88 kg/m  Pain Scale: 0-10 POSS *See Group Information*: S-Acceptable,Sleep, easy to arouse Pain Score: 7   SpO2: SpO2: 92 % O2 Device:SpO2: 92 % O2 Flow Rate: .   IO: Intake/output summary:  Intake/Output Summary (Last 24 hours) at 05/24/2024 1237 Last data filed at 05/24/2024 0900 Gross per 24 hour  Intake 480 ml  Output --  Net 480 ml    LBM: Last BM Date : 05/23/24 Baseline Weight: Weight: 79.4 kg Most recent weight: Weight: 77.2 kg      Palliative Assessment/Data: PPS 30-40%   Advanced Care Planning:   Primary Decision Maker: NEXT OF KIN  Code Status/Advance Care Planning: DNR  Patient has a DNR and MOST form on file with originals in the home.    Assessment & Plan:   SUMMARY OF RECOMMENDATIONS   DNR/DNI-as confirmed. DNR and MOST form in the home from previous completion.  Continue with current plan of care. Treat the treatable, no escalation in care.  Patient is clear in wishes to discharge home with [facility]'s hospice for comfort focused care. Aggressive symptom management.  PMT will continue to support and follow as needed. Please call team line or secure chat with urgent unmet palliative needs.  Symptom Management:  Pain management - Increase fentanyl  patch to 100 mcg every 72 hours. - Administer MSIR 15 to 30 mg every 4 hours as needed. - Continue Toradol  15 mg every 6 hours as needed.  -Hydromorphone  1mg  every 3 hours as needed for severe pain.   Constipation - Increase Miralax  to twice daily. - Continue Senokot 1 tablet twice daily.  Oxygen desaturation -Oxygen levels dropping to 80. Home oxygen support planned. - Arrange for home oxygen support with [facility]  Palliative Prophylaxis:  Bowel Regimen and Frequent Pain Assessment  Additional Recommendations (Limitations, Scope, Preferences): DNR/DNI, Comfort   Psycho-social/Spiritual:  Desire for further Chaplaincy support: no Additional Recommendations: Education on Hospice  Prognosis:  < 6 months  Discharge Planning:  Home with Hospice   Patient expressed understanding and was in agreement with this plan.    Time Total: 55 min   Visit consisted of counseling and education dealing with the complex and emotionally intense issues of symptom management and palliative care in the setting of serious and potentially life-threatening illness.  Signed by:  Dellia Ferguson, AGPCNP-BC [facility]   Phone: [phone] Pager: [phone] Amion: Cori Dials   Thank you for allowing the Palliative Medicine Team to assist in the care of this patient. Please utilize secure chat with additional questions, if there is  no response within 30 minutes please call the above phone number. Palliative Medicine Team providers are available by phone from 7am to 5pm daily and can be reached through the team cell phone.  Should this patient require assistance outside of these hours, please call the patient's attending physician.

## 2024-05-24 NOTE — Care Management Obs Status (Signed)
 MEDICARE OBSERVATION STATUS NOTIFICATION   Patient Details  Name: Syris Brookens MRN: [id number] Date of Birth: 08-Jun-1946   Medicare Observation Status Notification Given: Yes    Sonia Bromell A Caeson Filippi, LCSW 05/24/2024, 10:47 AM

## 2024-05-24 NOTE — Progress Notes (Signed)
 [facility] Liaison Note Received request from [place], Transitions of Care Manager, for hospice services at home after discharge. Spoke with patient to initiate education related to hospice philosophy, services, and team approach to care. Patient verbalized understanding of information given. Per discussion, the plan is for discharge home by once his pain is under better control with oral medications.   DME needs discussed. Patient has the a cane in the home. Patient requests the following equipment for delivery: Oxygen as he required it during this hospitalization. The address has been verified and is correct in the chart. Myrtie Atkinson is the family contact to arrange time of equipment delivery.  Please send signed and completed DNR home with patient/family. Please provide prescriptions at discharge as needed to ensure ongoing symptom management.   [facility] information and contact numbers given to patient. Above information shared with Orion Birks, Transitions of Care Manager. Please call with any questions or concerns.   Thank you for the opportunity to participate in this patient's care.   Madelene Schanz BSN, Charity fundraiser, OCN [employer] [phone]

## 2024-05-25 DIAGNOSIS — C61 Malignant neoplasm of prostate: Secondary | ICD-10-CM | POA: Diagnosis not present

## 2024-05-25 MED ORDER — ACETAMINOPHEN 325 MG PO TABS: 650.0000 mg | ORAL_TABLET | Freq: Four times a day (QID) | ORAL | Status: DC | PRN

## 2024-05-25 MED ORDER — MORPHINE SULFATE (CONCENTRATE) 10 MG /0.5 ML PO SOLN
15.0000 mg | ORAL | Status: DC | PRN
  Administered 2024-05-25 – 2024-05-26 (×4): 20 mg via ORAL
  Filled 2024-05-25 (×4): qty 1

## 2024-05-25 MED ORDER — HYDROMORPHONE HCL 1 MG/ML IJ SOLN: 1.0000 mg | INTRAMUSCULAR | Status: DC | PRN

## 2024-05-25 MED ORDER — FENTANYL 100 MCG/HR TD PT72
1.0000 | MEDICATED_PATCH | TRANSDERMAL | Status: DC
  Administered 2024-05-26: 1 via TRANSDERMAL
  Filled 2024-05-25: qty 1

## 2024-05-25 MED ORDER — ACETAMINOPHEN 500 MG PO TABS
1000.0000 mg | ORAL_TABLET | Freq: Three times a day (TID) | ORAL | Status: DC
  Administered 2024-05-25 – 2024-05-26 (×3): 1000 mg via ORAL
  Filled 2024-05-25 (×3): qty 2

## 2024-05-25 NOTE — Plan of Care (Signed)

## 2024-05-25 NOTE — Progress Notes (Signed)
 PROGRESS NOTE   Frederick Ponce  WUJ:[id number]    DOB: 1946/01/11    DOA: 05/23/2024  PCP: Susanna Epley, FNP   I have briefly reviewed patients previous medical records in [facility].   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  Admitted due to intractable cancer related pain.  Palliative care consulted for symptom management.  Hospice team evaluated for home hospice.  Possible DC home tomorrow pending adequate control on p.o. pain meds.   Assessment & Plan:   Intractable cancer-related pain Is followed by palliative care team at the cancer center Home regimen includes: Fentanyl  patch 75 mcg/h every other day, Toradol  10 mg at bedtime as needed, MS IR 15 mg every 4 hours as needed. Above pain regimen not controlling pain PTA. As discussed in detail with patient, he strongly wishes to proceed with Hospice, they were consulted.  Hospice have consulted and ready to start home hospice when discharged. Palliative care input 6/1 appreciated, have adjusted pain meds as follows: Increased fentanyl  patch from 75 mcg/h to 100 mcg/h every other day, Tylenol  1 g every 8 hours, MS IR 15-30 mg every 4 hourly as needed for moderate pain, Toradol  15 mg Q 6 hourly as needed for moderate pain not controlled with oral meds, continuing Dilaudid  1 mg every 2 hours as needed for severe pain.  Aggressive bowel regimen initiated. Pain is better  controlled.  Has utilized multiple doses of p.o. Zofran  for nausea over the last 24 hours.  Has used once daily dose of IV Dilaudid  yesterday and today.  Opioid related constipation Patient reports BM on 5/31 and 6/2 MiraLAX  17 g twice daily, Senokot S1 tab twice daily, Dulcolax p.o. as needed  Essential hypertension Controlled.  Continue prior home dose of amlodipine  5 Mg daily.  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  Parkinson's disease Continue home dose of Sinemet   Chronic bilateral leg erythema ?  Etiology.?  Related to radiation. Despite treatment with almost a week of doxycycline , has not seen any improvement. Reports that this has been present for almost 6 to 8 weeks Does not feel infectious in nature.  Possibly inflammatory.  For now continue oral doxycycline  but consider stopping.  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  No PE.   As per oncology has exhausted all treatment options and patient does not wish to pursue chemotherapy Hospice consulted Follows with Dr. Rosaline Coma, included him and the care team.  Body mass index is 25.88 kg/m.   DVT prophylaxis: enoxaparin (LOVENOX) injection 40 mg Start: 05/23/24 2200     Code Status: Limited: Do not attempt resuscitation (DNR) -DNR-LIMITED -Do Not Intubate/DNI :  Family Communication:  Disposition:  Remains inpatient while his multimodality pain meds are being titrated for intractable cancer-related pain.  Has received some IV pain meds as well.     Consultants:   Palliative care medicine Hospice team  Procedures:  Subjective:  Pain is much better controlled.  Had a BM this morning.  Denies hard stools or pain during defecation.  Objective:   Vitals:   05/24/24 1455 05/24/24 2200 05/25/24 0515 05/25/24  1358  BP: 123/60 132/65 123/63 136/68  Pulse: 88 85 77 91  Resp: 16 18 18 16   Temp: (!) 97.3 F (36.3 C) 97.6 F (36.4 C) (!) 97.4 F (36.3 C) 98 F (36.7 C)  TempSrc: Oral Oral Oral Oral  SpO2: 96% 91% 90% 92%  Weight:      Height:        General exam: Elderly male, moderately built and nourished sitting comfortably in reclining chair without distress.  Oral mucosa moist. Respiratory system: Clear to auscultation. Respiratory effort normal. Cardiovascular system: S1 & S2 heard, RRR. No JVD, murmurs, rubs, gallops or clicks. No pedal edema. Gastrointestinal system: Abdomen is nondistended, soft and nontender. No organomegaly or masses felt. Normal bowel sounds heard. Central nervous system: Alert and oriented. No focal neurological deficits. Extremities: Symmetric 5 x 5 power.  Bilateral legs with erythema up to mid legs, slightly warm, nontender and no other acute findings. Skin: No rashes, lesions or ulcers Psychiatry: Judgement and insight appear normal. Mood & affect appropriate.     Data Reviewed:   I have personally reviewed following labs and imaging studies   CBC: Recent Labs  Lab 05/23/24 0805  WBC 5.6  NEUTROABS 3.8  HGB 9.4*  HCT 30.0*  MCV 102.4*  PLT 245    Basic Metabolic Panel: Recent Labs  Lab 05/23/24 0805  NA 131*  K 4.0  CL 100  CO2 23  GLUCOSE 121*  BUN 14  CREATININE 0.73  CALCIUM  8.1*    Liver Function Tests: Recent Labs  Lab 05/23/24 0805  AST 13*  ALT <5  ALKPHOS 61  BILITOT 1.0  PROT 6.3*  ALBUMIN 3.1*    CBG: No results for input(s): "GLUCAP" in the last 168 hours.  Microbiology Studies:   Recent Results (from the past 240 hours)  Blood Culture (routine x 2)     Status: None (Preliminary result)   Collection Time: 05/23/24  8:05 AM   Specimen: BLOOD RIGHT ARM  Result Value Ref Range Status   Specimen Description   Final    BLOOD RIGHT ARM Performed at [facility], [address] [place], [place]  [address]    Special Requests   Final    BOTTLES DRAWN AEROBIC AND ANAEROBIC Blood Culture results may not be optimal due to an inadequate volume of blood received in culture bottles Performed at [facility], [address] [place], [place] [address]    Culture   Final    NO GROWTH 2 DAYS Performed at [facility], [address], [place], [place] [address]    Report Status PENDING  Incomplete  Blood Culture (routine x 2)     Status: None (Preliminary result)   Collection Time: 05/23/24  8:08 AM   Specimen: BLOOD LEFT ARM  Result Value Ref Range Status   Specimen Description   Final    BLOOD LEFT ARM Performed at [facility], [address], [place], [place] [address]    Special Requests   Final    BOTTLES DRAWN AEROBIC AND ANAEROBIC Blood Culture results may not be optimal due to an inadequate volume of blood received in culture bottles Performed at [facility], [address] [place], [place] [address]  Culture   Final    NO GROWTH 2 DAYS Performed at [facility], [address], [place], [place] [address]    Report Status PENDING  Incomplete    Radiology Studies:  No results found.   Scheduled Meds:    acetaminophen   1,000 mg Oral Q8H   amLODipine   5 mg Oral Daily   atorvastatin   40 mg Oral q morning   carbidopa -levodopa   1 tablet Oral QID   doxycycline   100 mg Oral BID   enoxaparin (LOVENOX) injection  40 mg Subcutaneous Q24H   feeding supplement  237 mL Oral BID BM   [START ON 05/26/2024] fentaNYL   1 patch Transdermal Q48H   polyethylene glycol  17 g Oral BID   senna-docusate  1 tablet Oral BID    Continuous Infusions:     LOS: 1 day     Aubrey Blas, MD,  FACP, [facility], [facility], [facility]   [facility] Physician Advisor [place]      To contact the attending provider between 7A-7P or the covering provider during after hours 7P-7A, please log into the web site [email or web address] and  access using universal [place] password for that web site. If you do not have the password, please call the hospital operator.  05/25/2024, 2:56 PM

## 2024-05-25 NOTE — Progress Notes (Incomplete)
   Daily Progress Note   Patient Name: Frederick Ponce       Date: 05/25/2024 DOB: [birth year]  Age: 78 y.o. MRN#: [id number] Attending Physician: Casey Clay, MD Primary Care Physician: Susanna Epley, FNP Admit Date: 05/23/2024  Reason for Consultation/Follow-up: Establishing goals of care, Non pain symptom management, and Pain control  Subjective: Chart Reviewed. Updates Received. Patient Assessed.    Length of Stay: 1 day  Vital Signs: BP 123/63 (BP Location: Right Arm)   Pulse 77   Temp (!) 97.4 F (36.3 C) (Oral)   Resp 18   Ht 5\' 8"  (1.727 m)   Wt 170 lb 3.1 oz (77.2 kg)   SpO2 90%   BMI 25.88 kg/m  SpO2: SpO2: 90 % O2 Device: O2 Device: Room Air O2 Flow Rate:   Last Weight  Most recent update: 05/23/2024  3:32 PM    Weight  77.2 kg (170 lb 3.1 oz)             Intake/Output Summary (Last 24 hours) at 05/25/2024 1242 Last data filed at 05/25/2024 1000 Gross per 24 hour  Intake 480 ml  Output --  Net 480 ml    Physical Exam: Gen:  NAD HEENT: moist mucous membranes CV: Regular rate and rhythm, no murmurs rubs or gallops PULM: clear to auscultation bilaterally. No wheezes/rales/rhonchi*** ABD: soft/nontender/nondistended/normal bowel sounds*** EXT: No edema*** Neuro: Alert and oriented x3***    Palliative Care Assessment & Plan  HPI:  Code Status: DNR  Assessment  Recommendations/Plan: *** Continue with current plan of care per medical team  PMT will continue to support and follow on as needed basis. Please secure chat for urgent needs.   Symptom Management: *** Thank you for allowing the Palliative Medicine Team to assist in the care of this patient.  Palliative Medicine Team providers are available by phone from 7am to 7pm daily and can be reached through the team cell phone. Should this patient require assistance outside of these hours, please call the patient's attending physician.  Any controlled substances utilized were prescribed in  the context of palliative care. PDMP has been reviewed.  Visit consisted of counseling and education dealing with the complex and emotionally intense issues of symptom management and palliative care in the setting of serious and potentially life-threatening illness.  Dellia Ferguson, AGPCNP-BC  [facility]  [phone]  *Please note that this is a verbal dictation therefore any spelling or grammatical errors are due to the "Dragon Medical One" system interpretation.

## 2024-05-25 NOTE — Plan of Care (Signed)
  Problem: Education: Goal: Knowledge of General Education information will improve Description: Including pain rating scale, medication(s)/side effects and non-pharmacologic comfort measures Outcome: Progressing   Problem: Health Behavior/Discharge Planning: Goal: Ability to manage health-related needs will improve Outcome: Progressing   Problem: Clinical Measurements: Goal: Will remain free from infection Outcome: Progressing   Problem: Activity: Goal: Risk for activity intolerance will decrease Outcome: Progressing   Problem: Nutrition: Goal: Adequate nutrition will be maintained Outcome: Progressing   Problem: Elimination: Goal: Will not experience complications related to bowel motility Outcome: Progressing Goal: Will not experience complications related to urinary retention Outcome: Progressing   Problem: Pain Managment: Goal: General experience of comfort will improve and/or be controlled Outcome: Progressing

## 2024-05-25 NOTE — Progress Notes (Signed)
 Frederick Ponce [phone] [facility] Liaison Note  Visited patient in follow up with plan for hospice services at home. Also discussed care with his spouse, Myrtie Atkinson and with inpatient palliative care provider.   Plan is for PMT to review and adjust oral pain medications with goal of patient having adequate pain management with oral medications only prior to discharge, tentatively planned for tomorrow.   Thank you for the opportunity to participate in this patient's care.    Madelene Schanz BSN, Charity fundraiser, OCN [employer] [phone]

## 2024-05-25 NOTE — Plan of Care (Signed)

## 2024-05-26 ENCOUNTER — Other Ambulatory Visit: Payer: Self-pay | Admitting: Nurse Practitioner

## 2024-05-26 ENCOUNTER — Other Ambulatory Visit (HOSPITAL_BASED_OUTPATIENT_CLINIC_OR_DEPARTMENT_OTHER): Payer: Self-pay

## 2024-05-26 ENCOUNTER — Other Ambulatory Visit (HOSPITAL_COMMUNITY): Payer: Self-pay

## 2024-05-26 ENCOUNTER — Other Ambulatory Visit: Payer: Self-pay

## 2024-05-26 ENCOUNTER — Ambulatory Visit (HOSPITAL_COMMUNITY): Attending: Hematology and Oncology

## 2024-05-26 ENCOUNTER — Encounter: Payer: Self-pay | Admitting: Hematology and Oncology

## 2024-05-26 DIAGNOSIS — G893 Neoplasm related pain (acute) (chronic): Secondary | ICD-10-CM | POA: Diagnosis not present

## 2024-05-26 DIAGNOSIS — C61 Malignant neoplasm of prostate: Secondary | ICD-10-CM

## 2024-05-26 DIAGNOSIS — C7951 Secondary malignant neoplasm of bone: Secondary | ICD-10-CM

## 2024-05-26 DIAGNOSIS — Z515 Encounter for palliative care: Secondary | ICD-10-CM

## 2024-05-26 MED ORDER — MORPHINE SULFATE (CONCENTRATE) 10 MG /0.5 ML PO SOLN: 15.0000 mg | ORAL | 0 refills | Status: DC | PRN

## 2024-05-26 MED ORDER — MORPHINE SULFATE (CONCENTRATE) 10 MG /0.5 ML PO SOLN
15.0000 mg | ORAL | 0 refills | Status: DC | PRN
  Filled 2024-05-26: qty 30, 4d supply, fill #0

## 2024-05-26 MED ORDER — FENTANYL 100 MCG/HR TD PT72
1.0000 | MEDICATED_PATCH | TRANSDERMAL | 0 refills | Status: DC
Start: 2024-05-26 — End: 2024-05-26
  Filled 2024-05-26: qty 10, 20d supply, fill #0

## 2024-05-26 MED ORDER — BISACODYL 5 MG PO TBEC
5.0000 mg | DELAYED_RELEASE_TABLET | Freq: Every day | ORAL | 0 refills | Status: DC | PRN
  Filled 2024-05-26: qty 30, 30d supply, fill #0

## 2024-05-26 MED ORDER — FENTANYL 100 MCG/HR TD PT72: 1.0000 | MEDICATED_PATCH | TRANSDERMAL | 0 refills | Status: DC

## 2024-05-26 MED ORDER — POLYETHYLENE GLYCOL 3350 17 GM/SCOOP PO POWD
17.0000 g | Freq: Two times a day (BID) | ORAL | 0 refills | Status: DC
  Filled 2024-05-26: qty 238, 7d supply, fill #0

## 2024-05-26 MED ORDER — SENNOSIDES-DOCUSATE SODIUM 8.6-50 MG PO TABS
1.0000 | ORAL_TABLET | Freq: Two times a day (BID) | ORAL | 0 refills | Status: DC
  Filled 2024-05-26: qty 60, 30d supply, fill #0

## 2024-05-26 NOTE — Discharge Instructions (Signed)

## 2024-05-26 NOTE — Progress Notes (Signed)
 Discharge medications delivered to [facility] [place] RN D Lavona Norsworthy RN

## 2024-05-26 NOTE — Discharge Summary (Signed)
 Physician Discharge Summary  Frederick Ponce ZOX:[id number] DOB: 1946-02-13  PCP: Susanna Epley, FNP  Admitted from: Home Discharged to: Home with hospice  Admit date: 05/23/2024 Discharge date: 05/26/2024  Recommendations for Outpatient Follow-up:    Follow-up Information     [facility] Follow up.   Specialty: Hospice and Palliative Medicine Why: Please follow up with this provider for hospice services. Contact information: [address] [facility] [place]  [address] [phone]        Susanna Epley, FNP. Schedule an appointment as soon as possible for a visit.   Specialty: General Practice Contact information: [address] [place] [address] [phone]         Ander Bame, MD. Schedule an appointment as soon as possible for a visit.   Specialty: Hematology and Oncology Contact information: [address] [place] [address] [phone]                  Home Health: None    Equipment/Devices: None    Discharge Condition: Improved and stable.  Overall poor prognosis.   Code Status: Limited: Do not attempt resuscitation (DNR) -DNR-LIMITED -Do Not Intubate/DNI  Diet recommendation:  Discharge Diet Orders (From admission, onward)     Start     Ordered   05/26/24 0000  Diet general        05/26/24 1239             Discharge Diagnoses:  Principal Problem:   Intractable pain Active Problems:   Prostate 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 (HCC)   HLD (hyperlipidemia)   Parkinson's disease (HCC)   Essential hypertension   Polyneuropathy   Failure to thrive in adult   Atherosclerosis of aorta [facility])   Brief Hospital Course:  78 year old male, lives with significant other, ambulates with assistance of a cane, medical history significant for metastatic castrate sensitive prostate cancer, 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  Admitted due to intractable cancer related pain.  Palliative care consulted for symptom management.  Hospice team evaluated for home hospice.  Pain well-controlled on p.o./transdermal meds at time of discharge.     Assessment & Plan:    Intractable cancer-related pain Is followed by palliative care team at the cancer center Home regimen includes: Fentanyl  patch 75 mcg/h every other day, Toradol  10 mg at bedtime as needed, MS IR 15 mg every 4 hours as needed. Above pain regimen not controlling pain PTA. As discussed in detail with patient, he strongly wishes to proceed with Hospice, they were consulted.  Hospice have consulted and ready to start home hospice when discharged. Palliative care consulted to assist with symptom management.  Made multiple pain medication changes and called in prescriptions below for discharge.  Pain adequately controlled on p.o./transdermal meds at time of discharge.  Rest can be managed by home hospice. Continue aggressive bowel regimen to avoid opioid-induced constipation.   Opioid related constipation Patient reports BM on 5/31 and 6/2 MiraLAX  17 g twice daily, Senokot S1 tab twice daily, Dulcolax p.o. as needed   Essential hypertension Controlled.  Continue prior home dose of amlodipine  5 Mg daily.   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   Parkinson's disease Continue home dose of Sinemet    Chronic bilateral leg erythema ?  Etiology.?  Related to radiation. Despite treatment with almost a week of doxycycline , has not seen any improvement. Reports that this  has been present for almost 6 to 8 weeks Does not feel infectious in nature.  Possibly inflammatory.  Discontinued doxycycline  at time of discharge.   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  No PE.   As per oncology has exhausted all treatment options and patient does not wish to pursue chemotherapy Hospice consulted Follows with Dr. Rosaline Coma, Medical Oncology.   Body mass index is 25.88 kg/m.     Consultants:   Palliative care medicine Hospice team   Procedures:      Discharge Instructions  Discharge Instructions     Call MD for:  difficulty breathing, headache or visual disturbances   Complete by: As directed    Call MD for:  extreme fatigue   Complete by: As directed    Call MD for:  persistant dizziness or light-headedness   Complete by: As directed    Call MD for:  persistant nausea and vomiting   Complete by: As directed    Call MD for:  severe uncontrolled pain   Complete by: As directed    Call MD for:  temperature >100.4   Complete by: As directed    Diet general   Complete by: As directed    Increase activity slowly   Complete by: As directed    No wound care   Complete by: As directed         Medication List     STOP taking these medications    doxycycline  100 MG tablet Commonly known as: VIBRA -TABS   fentaNYL  75 MCG/HR Commonly known as: DURAGESIC  Replaced by: fentaNYL  100 MCG/HR   morphine  15 MG tablet Commonly known as: MSIR Replaced by: morphine  CONCENTRATE 10 mg / 0.5 ml concentrated solution       TAKE these medications    acetaminophen  500 MG tablet Commonly known as: TYLENOL  Take 1,000 mg by mouth every 6 (six) hours as needed for mild pain (pain score 1-3) or moderate pain (pain score 4-6).   amLODipine  5 MG tablet Commonly known as: NORVASC  Take 1 tablet (5 mg total) by mouth daily.   atorvastatin  40 MG  tablet Commonly known as: LIPITOR TAKE 1 TABLET BY MOUTH EVERY MORNING   bisacodyl 5 MG EC tablet Commonly known as: DULCOLAX Take 1 tablet (5 mg total) by mouth daily as needed for moderate constipation or mild constipation.   carbidopa -levodopa  25-100 MG tablet Commonly known as: Sinemet  Take 1 tablet by mouth 4 (four) times daily.   COENZYME Q10 PO Take 1 capsule by mouth every morning.   fentaNYL  100 MCG/HR Commonly known as: DURAGESIC  Place 1 patch onto the skin every other day. Replaces: fentaNYL  75 MCG/HR   guaiFENesin  600 MG 12 hr tablet Commonly known as: MUCINEX  Take 2 tablets (1,200 mg total) by mouth 2 (two) times daily as needed for cough or to loosen phlegm. What changed:  how much to take when to take this   ketorolac  10 MG tablet Commonly known as: TORADOL  Take 1 tablet (10 mg total) by mouth every 8 (eight) hours as needed. What changed:  when to take this reasons to take this   KRILL  OIL PO Take 1 capsule by mouth every morning.   morphine  CONCENTRATE 10 mg / 0.5 ml concentrated solution Take 0.75-1 mLs (15-20 mg total) by mouth every 3 (three) hours as needed for severe pain (pain score 7-10). Replaces: morphine  15 MG tablet   ondansetron  4 MG tablet Commonly known as: ZOFRAN  Take 1 tablet (4 mg total) by mouth every 6 (six) hours as needed for nausea.   polyethylene glycol powder 17 GM/SCOOP powder Commonly known as: GLYCOLAX /MIRALAX  Take 17 g by mouth 2 (two) times daily.   senna-docusate 8.6-50 MG tablet Commonly known as: Senokot-S Take 1 tablet by mouth 2 (two) times daily.       Allergies  Allergen Reactions   Gabapentin  Other (See Comments)    Restless legs/confusion; patient denies symptoms and ask that this be removed.   Celebrex  [Celecoxib ] Rash      Procedures/Studies: CT Angio Chest PE W and/or Wo Contrast Result Date: 05/23/2024 CLINICAL DATA:  Acute, non localized abdominal pain and fever since this morning. Chest  congestion. Bilateral lower leg cellulitis. Prostate cancer treated with radiation therapy. Previously demonstrated left lung and bone metastases. EXAM: CT ANGIOGRAPHY CHEST CT ABDOMEN AND PELVIS WITH CONTRAST TECHNIQUE: Multidetector CT imaging of the chest was performed using the standard protocol during bolus administration of intravenous contrast. Multiplanar CT image reconstructions and MIPs were obtained to evaluate the vascular anatomy. Multidetector CT imaging of the abdomen and pelvis was performed using the standard protocol during bolus administration of intravenous contrast. RADIATION DOSE REDUCTION: This exam was performed according to the departmental dose-optimization program which includes automated exposure control, adjustment of the mA and/or kV according to patient size and/or use of iterative reconstruction technique. CONTRAST:  OMNIPAQUE  IOHEXOL  350 MG/ML SOLN COMPARISON:  Portable chest radiograph obtained earlier today. Abdomen and pelvis CT dated 04/01/2024. Chest CTA dated 01/06/2024. PET-CT dated 11/04/2023 FINDINGS: CTA CHEST FINDINGS Cardiovascular: Stable mildly enlarged heart. Small pericardial effusion without significant change. Normally opacified pulmonary arteries with no pulmonary arterial filling defects seen. Mediastinum/Nodes: Interval decrease in size of some of the previously demonstrated enlarged mediastinal nodes with an interval mild increase in size of some of the previously demonstrated normal sized mediastinal nodes. A previous 18 mm short axis AP window lymph node currently has a short axis diameter of 15 mm on image number 50/5. A previously demonstrated 4 mm short axis prevascular lymph node on the right currently has a short axis diameter of 6 mm on image number 47/5. A 2nd previously demonstrated 4 mm short axis prevascular node has a short axis diameter of 9 mm on image number 54/5. Mildly enlarged bilateral supraclavicular lymph nodes with an interval  increase in size. The largest is on the left with a short axis diameter of 12 mm on image number 14/5, previously 9 mm. No enlarged axillary lymph nodes. Interval enlarged proximal left hilar node with a short axis diameter of 16 mm on image number 71/5, previously 8 mm. Interval increase in size of a normal-sized right hilar node with a short axis diameter of 5 mm on image number 64, previously 2 mm. Unremarkable thyroid gland and esophagus. Small amount of retained secretions in the trachea. Lungs/Pleura: Small to moderate-sized left pleural effusion, increased. A left lower lobe infrahilar mass currently measures 3.0 x 2.8 cm on image number 75/5, previously 3.1 x 2.6 cm on 01/06/2024. This had some increased tracer activity on the previous PET-CT. More peripheral left lower lobe mass currently measures 5.0 x 3.7 cm  on image number 92/5, previously 4.6 x 3.7 cm on 01/06/2024. This has markedly increased tracer activity on the previous PET-CT. The 1.3 x 0.7 cm spiculated nodule in the left upper lobe seen on 01/06/2024 currently measures 2.4 x 2.0 cm on image number 58/13. There is a large number of additional new, smaller nodules in the left upper lobe. Progressive diffuse interstitial nodular opacities throughout the left lower lobe with interval similar opacities in the posterior left upper lobe. Clear right lung.  No right pleural fluid. 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 Stable sclerotic lesions in the T7 vertebral body, left T7 transverse process and left 7th posterior rib. Stable thoracic spine degenerative changes and ankylosis. Review of the MIP images confirms the above findings. CT ABDOMEN and PELVIS FINDINGS Hepatobiliary: Mild diffuse low-density of the liver. Stable right lobe liver cyst and hemangioma. Normal-appearing gallbladder. Pancreas: Moderate diffuse pancreatic atrophy. Spleen: Normal in size without focal abnormality.  Adrenals/Urinary Tract: Interval medium density left adrenal nodule measuring 2.3 x 1.5 cm, suspicious for an adrenal metastasis. Stable bilateral renal calculi. Mild dilatation of the right renal collecting system with improvement and interval mild dilatation of the right ureter with tapering 2 normal-size proximally 2 cm proximal to the right ureterovesical junction with no obstructing stone or mass visualized. Stable right renal parapelvic cyst. This does not need imaging follow-up. There is some excreted contrast on the initial images in the left renal collecting system and proximal ureter due to the chest being imaged 1st. Unremarkable urinary bladder. Stomach/Bowel: Loop of sigmoid colon in a large right inguinal hernia extruding into the upper scrotum on the right without wall thickening or dilatation. Unremarkable stomach, small bowel and appendix. Vascular/Lymphatic: Atheromatous aortic calcifications without aneurysm. Interval mildly enlarged retroperitoneal lymph nodes. The largest is a left para-aortic node with a short axis diameter of 12 mm on image number 41/2, previously 2 mm. Reproductive: Small prostate gland. Other: Stable large right inguinal hernia containing herniated sigmoid colon without obstruction. Small left inguinal hernia containing fat. Musculoskeletal: Extensive sclerotic metastatic disease involving the bony pelvis without significant change. Sclerotic areas in the L5 and L2 vertebral bodies without significant change. Review of the MIP images confirms the above findings. IMPRESSION: 1. No pulmonary embolus. 2. Progressive left lung metastatic disease with a large number of new, smaller nodules in the left upper lobe, enlargement of the previously demonstrated spiculated nodule in the left upper lobe, and progressive diffuse interstitial nodular opacities throughout the left lower lobe and posterior left upper lobe. 3. No significant change to slight increase in size of 2 dominant  metastases in the left lower lobe. 4. Progressive mediastinal, left hilar and bilateral supraclavicular adenopathy with interval mild retroperitoneal adenopathy compatible with metastatic adenopathy. 5. Interval 2.3 x 1.5 cm left adrenal nodule, suspicious for an adrenal metastasis. 6. Stable sclerotic metastatic disease in the thoracic spine, left 7th posterior rib, L5 and L2 vertebral bodies and bony pelvis. 7. Stable bilateral renal calculi. 8. Mild dilatation of the right renal collecting system with improvement and interval mild dilatation of the right ureter with tapering to normal-size proximally 2 cm proximal to the right ureterovesical junction with no obstructing stone or mass visualized. 9. Stable large right inguinal hernia containing herniated sigmoid colon without obstruction. 10. Stable small pericardial effusion. 11. Mild hepatic steatosis. 12. Aortic atherosclerosis. Aortic Atherosclerosis (ICD10-I70.0). Electronically Signed   By: Catherin Closs M.D.   On: 05/23/2024 12:09   CT ABDOMEN PELVIS  W CONTRAST Result Date: 05/23/2024 CLINICAL DATA:  Acute, non localized abdominal pain and fever since this morning. Chest congestion. Bilateral lower leg cellulitis. Prostate cancer treated with radiation therapy. Previously demonstrated left lung and bone metastases. EXAM: CT ANGIOGRAPHY CHEST CT ABDOMEN AND PELVIS WITH CONTRAST TECHNIQUE: Multidetector CT imaging of the chest was performed using the standard protocol during bolus administration of intravenous contrast. Multiplanar CT image reconstructions and MIPs were obtained to evaluate the vascular anatomy. Multidetector CT imaging of the abdomen and pelvis was performed using the standard protocol during bolus administration of intravenous contrast. RADIATION DOSE REDUCTION: This exam was performed according to the departmental dose-optimization program which includes automated exposure control, adjustment of the mA and/or kV according to patient size  and/or use of iterative reconstruction technique. CONTRAST:  OMNIPAQUE  IOHEXOL  350 MG/ML SOLN COMPARISON:  Portable chest radiograph obtained earlier today. Abdomen and pelvis CT dated 04/01/2024. Chest CTA dated 01/06/2024. PET-CT dated 11/04/2023 FINDINGS: CTA CHEST FINDINGS Cardiovascular: Stable mildly enlarged heart. Small pericardial effusion without significant change. Normally opacified pulmonary arteries with no pulmonary arterial filling defects seen. Mediastinum/Nodes: Interval decrease in size of some of the previously demonstrated enlarged mediastinal nodes with an interval mild increase in size of some of the previously demonstrated normal sized mediastinal nodes. A previous 18 mm short axis AP window lymph node currently has a short axis diameter of 15 mm on image number 50/5. A previously demonstrated 4 mm short axis prevascular lymph node on the right currently has a short axis diameter of 6 mm on image number 47/5. A 2nd previously demonstrated 4 mm short axis prevascular node has a short axis diameter of 9 mm on image number 54/5. Mildly enlarged bilateral supraclavicular lymph nodes with an interval increase in size. The largest is on the left with a short axis diameter of 12 mm on image number 14/5, previously 9 mm. No enlarged axillary lymph nodes. Interval enlarged proximal left hilar node with a short axis diameter of 16 mm on image number 71/5, previously 8 mm. Interval increase in size of a normal-sized right hilar node with a short axis diameter of 5 mm on image number 64, previously 2 mm. Unremarkable thyroid gland and esophagus. Small amount of retained secretions in the trachea. Lungs/Pleura: Small to moderate-sized left pleural effusion, increased. A left lower lobe infrahilar mass currently measures 3.0 x 2.8 cm on image number 75/5, previously 3.1 x 2.6 cm on 01/06/2024. This had some increased tracer activity on the previous PET-CT. More peripheral left lower lobe mass  currently measures 5.0 x 3.7 cm on image number 92/5, previously 4.6 x 3.7 cm on 01/06/2024. This has markedly increased tracer activity on the previous PET-CT. The 1.3 x 0.7 cm spiculated nodule in the left upper lobe seen on 01/06/2024 currently measures 2.4 x 2.0 cm on image number 58/13. There is a large number of additional new, smaller nodules in the left upper lobe. Progressive diffuse interstitial nodular opacities throughout the left lower lobe with interval similar opacities in the posterior left upper lobe. Clear right lung.  No right pleural fluid. 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 Stable sclerotic lesions in the T7 vertebral body, left T7 transverse process and left 7th posterior rib. Stable thoracic spine degenerative changes and ankylosis. Review of the MIP images confirms the above findings. CT ABDOMEN and PELVIS FINDINGS Hepatobiliary: Mild diffuse low-density of the liver. Stable right lobe liver cyst and hemangioma. Normal-appearing gallbladder. Pancreas: Moderate diffuse  pancreatic atrophy. Spleen: Normal in size without focal abnormality. Adrenals/Urinary Tract: Interval medium density left adrenal nodule measuring 2.3 x 1.5 cm, suspicious for an adrenal metastasis. Stable bilateral renal calculi. Mild dilatation of the right renal collecting system with improvement and interval mild dilatation of the right ureter with tapering 2 normal-size proximally 2 cm proximal to the right ureterovesical junction with no obstructing stone or mass visualized. Stable right renal parapelvic cyst. This does not need imaging follow-up. There is some excreted contrast on the initial images in the left renal collecting system and proximal ureter due to the chest being imaged 1st. Unremarkable urinary bladder. Stomach/Bowel: Loop of sigmoid colon in a large right inguinal hernia extruding into the upper scrotum on the right without wall thickening or  dilatation. Unremarkable stomach, small bowel and appendix. Vascular/Lymphatic: Atheromatous aortic calcifications without aneurysm. Interval mildly enlarged retroperitoneal lymph nodes. The largest is a left para-aortic node with a short axis diameter of 12 mm on image number 41/2, previously 2 mm. Reproductive: Small prostate gland. Other: Stable large right inguinal hernia containing herniated sigmoid colon without obstruction. Small left inguinal hernia containing fat. Musculoskeletal: Extensive sclerotic metastatic disease involving the bony pelvis without significant change. Sclerotic areas in the L5 and L2 vertebral bodies without significant change. Review of the MIP images confirms the above findings. IMPRESSION: 1. No pulmonary embolus. 2. Progressive left lung metastatic disease with a large number of new, smaller nodules in the left upper lobe, enlargement of the previously demonstrated spiculated nodule in the left upper lobe, and progressive diffuse interstitial nodular opacities throughout the left lower lobe and posterior left upper lobe. 3. No significant change to slight increase in size of 2 dominant metastases in the left lower lobe. 4. Progressive mediastinal, left hilar and bilateral supraclavicular adenopathy with interval mild retroperitoneal adenopathy compatible with metastatic adenopathy. 5. Interval 2.3 x 1.5 cm left adrenal nodule, suspicious for an adrenal metastasis. 6. Stable sclerotic metastatic disease in the thoracic spine, left 7th posterior rib, L5 and L2 vertebral bodies and bony pelvis. 7. Stable bilateral renal calculi. 8. Mild dilatation of the right renal collecting system with improvement and interval mild dilatation of the right ureter with tapering to normal-size proximally 2 cm proximal to the right ureterovesical junction with no obstructing stone or mass visualized. 9. Stable large right inguinal hernia containing herniated sigmoid colon without obstruction. 10. Stable  small pericardial effusion. 11. Mild hepatic steatosis. 12. Aortic atherosclerosis. Aortic Atherosclerosis (ICD10-I70.0). Electronically Signed   By: Catherin Closs M.D.   On: 05/23/2024 12:09   DG Chest Port 1 View Result Date: 05/23/2024 CLINICAL DATA:  Questionable sepsis - evaluate for abnormality, fever EXAM: PORTABLE CHEST - 1 VIEW COMPARISON:  January 06, 2024 FINDINGS: Perihilar airspace disease in the left mid and lower lung zones. No pneumothorax or pleural effusion. Unchanged cardiomegaly. No acute fracture or destructive lesion. IMPRESSION: Perihilar airspace disease in the left mid and lower lung zones, worrisome for bronchopneumonia. Electronically Signed   By: Rance Burrows M.D.   On: 05/23/2024 08:46      Subjective: Pain controlled on p.o./transdermal meds.  Reports that he slept quite well last night.  Had BM yesterday.  No other complaints reported.  Discharge Exam:  Vitals:   05/25/24 0515 05/25/24 1358 05/25/24 2009 05/26/24 0509  BP: 123/63 136/68 (!) 118/58 (!) 125/58  Pulse: 77 91 89 83  Resp: 18 16 19 20   Temp: (!) 97.4 F (36.3 C) 98 F (36.7 C) 98.5 F (36.9  C) 98.3 F (36.8 C)  TempSrc: Oral Oral Oral Oral  SpO2: 90% 92% 91% 92%  Weight:      Height:        General exam: Elderly male, moderately built and nourished sitting comfortably in reclining chair without distress.  Oral mucosa moist. Respiratory system: Clear to auscultation.  No increased work of breathing.. Cardiovascular system: S1 & S2 heard, RRR. No JVD, murmurs, rubs, gallops or clicks. No pedal edema. Gastrointestinal system: Abdomen is nondistended, soft and nontender. No organomegaly or masses felt. Normal bowel sounds heard. Central nervous system: Alert and oriented. No focal neurological deficits. Extremities: Symmetric 5 x 5 power.  Bilateral legs with erythema up to mid legs, slightly warm, nontender and no other acute findings. Skin: No rashes, lesions or ulcers Psychiatry:  Judgement and insight appear normal. Mood & affect appropriate.     The results of significant diagnostics from this hospitalization (including imaging, microbiology, ancillary and laboratory) are listed below for reference.     Microbiology: Recent Results (from the past 240 hours)  Blood Culture (routine x 2)     Status: None (Preliminary result)   Collection Time: 05/23/24  8:05 AM   Specimen: BLOOD RIGHT ARM  Result Value Ref Range Status   Specimen Description   Final    BLOOD RIGHT ARM Performed at [facility], [address] [place], [place] [address]    Special Requests   Final    BOTTLES DRAWN AEROBIC AND ANAEROBIC Blood Culture results may not be optimal due to an inadequate volume of blood received in culture bottles Performed at [facility], [address] [address] [place], [place] [address]    Culture   Final    NO GROWTH 3 DAYS Performed at [facility], [address], [place], [place] [address]    Report Status PENDING  Incomplete  Blood Culture (routine x 2)     Status: None (Preliminary result)   Collection Time: 05/23/24  8:08 AM   Specimen: BLOOD LEFT ARM  Result Value Ref Range Status   Specimen Description   Final    BLOOD LEFT ARM Performed at [facility], [address] [place], [place] [address]    Special Requests   Final    BOTTLES DRAWN AEROBIC AND ANAEROBIC Blood Culture results may not be optimal due to an inadequate volume of blood received in culture bottles Performed at [facility], [address] [place], [place] [address]    Culture   Final    NO GROWTH 3 DAYS Performed at [facility], [address] [place], [place] [address]    Report Status PENDING  Incomplete     Labs: CBC: Recent Labs  Lab 05/23/24 0805  WBC 5.6  NEUTROABS 3.8  HGB 9.4*  HCT 30.0*  MCV 102.4*  PLT 245    Basic Metabolic Panel: Recent Labs  Lab 05/23/24 0805  NA 131*  K 4.0  CL 100   CO2 23  GLUCOSE 121*  BUN 14  CREATININE 0.73  CALCIUM  8.1*    Liver Function Tests: Recent Labs  Lab 05/23/24 0805  AST 13*  ALT <5  ALKPHOS 61  BILITOT 1.0  PROT 6.3*  ALBUMIN 3.1*     Urinalysis    Component Value Date/Time   COLORURINE YELLOW 05/23/2024 1101   APPEARANCEUR CLEAR 05/23/2024 1101   LABSPEC 1.011 05/23/2024 1101   PHURINE 6.0 05/23/2024 1101   GLUCOSEU  NEGATIVE 05/23/2024 1101   HGBUR NEGATIVE 05/23/2024 1101   BILIRUBINUR NEGATIVE 05/23/2024 1101   KETONESUR 5 (A) 05/23/2024 1101   PROTEINUR NEGATIVE 05/23/2024 1101   NITRITE NEGATIVE 05/23/2024 1101   LEUKOCYTESUR NEGATIVE 05/23/2024 1101      Time coordinating discharge: 25 minutes  SIGNED:  Aubrey Blas, MD,  FACP, [facility], [facility]   [facility] [place]     To contact the attending provider between 7A-7P or the covering provider during after hours 7P-7A, please log into the web site [email or web address] and access using universal [place] password for that web site. If you do not have the password, please call the hospital operator.

## 2024-05-28 ENCOUNTER — Inpatient Hospital Stay

## 2024-05-28 LAB — CULTURE, BLOOD (ROUTINE X 2)
Culture: NO GROWTH
Culture: NO GROWTH

## 2024-05-28 NOTE — Progress Notes (Signed)
 COVID Vaccine received:  []  No []  Yes Date of any COVID positive Test in last 90 days:  PCP - Susanna Epley FNP Cardiologist -   Chest x-ray - 05/23/24 EKG -  05/25/24 EPIC Stress Test -  ECHO -  Cardiac Cath -   Bowel Prep - []  No  []   Yes ______  Pacemaker / ICD device []  No []  Yes   Spinal Cord Stimulator:[]  No []  Yes       History of Sleep Apnea? []  No []  Yes   CPAP used?- []  No []  Yes    Does the patient monitor blood sugar?          []  No []  Yes  []  N/A  Patient has: []  NO Hx DM   []  Pre-DM                 []  DM1  []   DM2 Does patient have a [employer] or [employer]? []  No []  Yes   Fasting Blood Sugar Ranges-  Checks Blood Sugar _____ times a day  GLP1 agonist / usual dose -  GLP1 instructions:  SGLT-2 inhibitors / usual dose -  SGLT-2 instructions:   Blood Thinner / Instructions: Aspirin Instructions:  Comments:   Activity level: Patient is able / unable to climb a flight of stairs without difficulty; []  No CP  []  No SOB, but would have ___   Patient can / can not perform ADLs without assistance.   Anesthesia review:   Patient denies shortness of breath, fever, cough and chest pain at PAT appointment.  Patient verbalized understanding and agreement to the Pre-Surgical Instructions that were given to them at this PAT appointment. Patient was also educated of the need to review these PAT instructions again prior to his/her surgery.I reviewed the appropriate phone numbers to call if they have any and questions or concerns.

## 2024-05-28 NOTE — Patient Instructions (Signed)
 SURGICAL WAITING ROOM VISITATION  Patients having surgery or a procedure may have no more than 2 support people in the waiting area - these visitors may rotate.    Children under the age of 78 must have an adult with them who is not the patient.  Visitors with respiratory illnesses are discouraged from visiting and should remain at home.  If the patient needs to stay at the hospital during part of their recovery, the visitor guidelines for inpatient rooms apply. Pre-op nurse will coordinate an appropriate time for 1 support person to accompany patient in pre-op.  This support person may not rotate.    Please refer to the [facility] website for the visitor guidelines for Inpatients (after your surgery is over and you are in a regular room).       Your procedure is scheduled on: 06/16/24   Report to [facility] Main Entrance    Report to admitting at 11:15 AM   Call this number if you have problems the morning of surgery [phone]   Do not eat food :After Midnight.   After Midnight you may have the following liquids until  10:30 AM  DAY OF SURGERY  Water Non-Citrus Juices (without pulp, NO RED-Apple, White grape, White cranberry) Black Coffee (NO MILK/CREAM OR CREAMERS, sugar ok)  Clear Tea (NO MILK/CREAM OR CREAMERS, sugar ok) regular and decaf                             Plain Jell-O (NO RED)                                           Fruit ices (not with fruit pulp, NO RED)                                     Popsicles (NO RED)                                                               Sports drinks like Gatorade (NO RED)              Oral Hygiene is also important to reduce your risk of infection.                                    Remember - BRUSH YOUR TEETH THE MORNING OF SURGERY WITH YOUR REGULAR TOOTHPASTE  DENTURES WILL BE REMOVED PRIOR TO SURGERY PLEASE DO NOT APPLY "Poly grip" OR ADHESIVES!!!   Stop all vitamins and herbal supplements 7 days before  surgery.   Take these medicines the morning of surgery with A SIP OF WATER: amlodipine , atorvastatin , carbidopa /levidopa(sinemet ), Tylenol , Fentanyl  patch.             You may not have any metal on your body including hair pins, jewelry, and body piercing             Do not wear make-up, lotions, powders, perfumes/cologne, or deodorant  Men may shave face and neck.   Do not bring valuables to the hospital. [facility] IS NOT             RESPONSIBLE   FOR VALUABLES.   Contacts, glasses, dentures or bridgework may not be worn into surgery.  DO NOT BRING YOUR HOME MEDICATIONS TO THE HOSPITAL. PHARMACY WILL DISPENSE MEDICATIONS LISTED ON YOUR MEDICATION LIST TO YOU DURING YOUR ADMISSION IN THE HOSPITAL!    Patients discharged on the day of surgery will not be allowed to drive home.  Someone NEEDS to stay with you for the first 24 hours after anesthesia.   Special Instructions: Bring a copy of your healthcare power of attorney and living will documents the day of surgery if you haven't scanned them before.              Please read over the following fact sheets you were given: IF YOU HAVE QUESTIONS ABOUT YOUR PRE-OP INSTRUCTIONS PLEASE CALL [phone] [facility]   If you received a COVID test during your pre-op visit  it is requested that you wear a mask when out in public, stay away from anyone that may not be feeling well and notify your surgeon if you develop symptoms. If you test positive for Covid or have been in contact with anyone that has tested positive in the last 10 days please notify you surgeon.    [facility] - Preparing for Surgery Before surgery, you can play an important role.  Because skin is not sterile, your skin needs to be as free of germs as possible.  You can reduce the number of germs on your skin by washing with CHG (chlorahexidine gluconate) soap before surgery.  CHG is an antiseptic cleaner which kills germs and bonds with the skin to continue killing  germs even after washing. Please DO NOT use if you have an allergy to CHG or antibacterial soaps.  If your skin becomes reddened/irritated stop using the CHG and inform your nurse when you arrive at Short Stay. Do not shave (including legs and underarms) for at least 48 hours prior to the first CHG shower.  You may shave your face/neck.  Please follow these instructions carefully:  1.  Shower with CHG Soap the night before surgery and the  morning of surgery.  2.  If you choose to wash your hair, wash your hair first as usual with your normal  shampoo.  3.  After you shampoo, rinse your hair and body thoroughly to remove the shampoo.                             4.  Use CHG as you would any other liquid soap.  You can apply chg directly to the skin and wash.  Gently with a scrungie or clean washcloth.  5.  Apply the CHG Soap to your body ONLY FROM THE NECK DOWN.   Do   not use on face/ open                           Wound or open sores. Avoid contact with eyes, ears mouth and   genitals (private parts).                       Wash face,  Genitals (private parts) with your normal soap.  6.  Wash thoroughly, paying special attention to the area where your    surgery  will be performed.  7.  Thoroughly rinse your body with warm water from the neck down.  8.  DO NOT shower/wash with your normal soap after using and rinsing off the CHG Soap.                9.  Pat yourself dry with a clean towel.            10.  Wear clean pajamas.            11.  Place clean sheets on your bed the night of your first shower and do not  sleep with pets. Day of Surgery : Do not apply any lotions/deodorants the morning of surgery.  Please wear clean clothes to the hospital/surgery center.  FAILURE TO FOLLOW THESE INSTRUCTIONS MAY RESULT IN THE CANCELLATION OF YOUR SURGERY  PATIENT SIGNATURE_________________________________  NURSE  SIGNATURE__________________________________  ________________________________________________________________________

## 2024-05-29 ENCOUNTER — Telehealth: Payer: Self-pay | Admitting: *Deleted

## 2024-05-29 NOTE — Telephone Encounter (Signed)
 TCT patient to see how he is doing since hospital discharge. Spoke with him. Asked how hos was doing. He states he isn't feeling that great. His pain is under control but the pain meds make him  "Loopy". His partner is with him. He also confirms that Hospice is now on board and seeing him regularly.  He states he will call if he needs anything from Dr. Rosaline Coma

## 2024-05-30 ENCOUNTER — Other Ambulatory Visit (HOSPITAL_COMMUNITY): Payer: Self-pay

## 2024-06-01 ENCOUNTER — Encounter (HOSPITAL_COMMUNITY)
Admission: RE | Admit: 2024-06-01 | Discharge: 2024-06-01 | Disposition: A | Discharge status: Home / Self Care | Source: Ambulatory Visit | Attending: Anesthesiology | Admitting: Anesthesiology

## 2024-06-02 ENCOUNTER — Other Ambulatory Visit (HOSPITAL_COMMUNITY): Payer: Self-pay

## 2024-06-08 ENCOUNTER — Ambulatory Visit: Admitting: Hematology and Oncology

## 2024-06-08 ENCOUNTER — Other Ambulatory Visit

## 2024-06-16 ENCOUNTER — Ambulatory Visit (HOSPITAL_COMMUNITY): Admit: 2024-06-16 | Admitting: General Surgery

## 2024-06-16 SURGERY — REPAIR, HERNIA, INGUINAL, INCARCERATED
Anesthesia: General | Laterality: Right

## 2024-06-18 ENCOUNTER — Telehealth: Payer: Self-pay | Admitting: Nurse Practitioner

## 2024-06-18 NOTE — Telephone Encounter (Signed)
 Called to express condolences to his husband on the loss.

## 2024-06-23 DEATH — deceased

## 2024-06-30 ENCOUNTER — Other Ambulatory Visit

## 2024-07-03 ENCOUNTER — Ambulatory Visit: Admitting: Hematology and Oncology

## 2024-07-03 ENCOUNTER — Encounter

## 2024-07-27 ENCOUNTER — Inpatient Hospital Stay

## 2024-07-29 ENCOUNTER — Other Ambulatory Visit: Payer: Self-pay | Admitting: Nurse Practitioner

## 2024-07-29 DIAGNOSIS — I10 Essential (primary) hypertension: Secondary | ICD-10-CM

## 2024-07-31 ENCOUNTER — Inpatient Hospital Stay: Admitting: Hematology and Oncology

## 2024-07-31 ENCOUNTER — Inpatient Hospital Stay

## 2024-08-10 ENCOUNTER — Ambulatory Visit: Payer: Medicare Other | Admitting: Nurse Practitioner

## 2024-10-27 ENCOUNTER — Ambulatory Visit: Admitting: Neurology
